# Patient Record
Sex: Female | Born: 1950 | Race: White | Hispanic: No | Smoking: Former smoker
Health system: Southern US, Community
[De-identification: ages and names within clinical notes are randomized; demographics above are authoritative.]

## PROBLEM LIST (undated history)

## (undated) DIAGNOSIS — I5022 Chronic systolic (congestive) heart failure: Secondary | ICD-10-CM

## (undated) DIAGNOSIS — G629 Polyneuropathy, unspecified: Secondary | ICD-10-CM

## (undated) DIAGNOSIS — I639 Cerebral infarction, unspecified: Secondary | ICD-10-CM

## (undated) DIAGNOSIS — E119 Type 2 diabetes mellitus without complications: Secondary | ICD-10-CM

## (undated) DIAGNOSIS — F329 Major depressive disorder, single episode, unspecified: Secondary | ICD-10-CM

## (undated) DIAGNOSIS — K279 Peptic ulcer, site unspecified, unspecified as acute or chronic, without hemorrhage or perforation: Secondary | ICD-10-CM

## (undated) DIAGNOSIS — E782 Mixed hyperlipidemia: Secondary | ICD-10-CM

## (undated) DIAGNOSIS — Z889 Allergy status to unspecified drugs, medicaments and biological substances status: Secondary | ICD-10-CM

## (undated) DIAGNOSIS — F32A Depression, unspecified: Secondary | ICD-10-CM

## (undated) DIAGNOSIS — C50919 Malignant neoplasm of unspecified site of unspecified female breast: Secondary | ICD-10-CM

## (undated) DIAGNOSIS — I2510 Atherosclerotic heart disease of native coronary artery without angina pectoris: Secondary | ICD-10-CM

## (undated) DIAGNOSIS — M069 Rheumatoid arthritis, unspecified: Secondary | ICD-10-CM

## (undated) DIAGNOSIS — F419 Anxiety disorder, unspecified: Secondary | ICD-10-CM

## (undated) DIAGNOSIS — L089 Local infection of the skin and subcutaneous tissue, unspecified: Secondary | ICD-10-CM

## (undated) DIAGNOSIS — N200 Calculus of kidney: Secondary | ICD-10-CM

## (undated) DIAGNOSIS — S21101A Unspecified open wound of right front wall of thorax without penetration into thoracic cavity, initial encounter: Secondary | ICD-10-CM

## (undated) HISTORY — DX: Unspecified open wound of right front wall of thorax without penetration into thoracic cavity, initial encounter: S21.101A

## (undated) HISTORY — PX: BREAST SURGERY: SHX581

## (undated) HISTORY — DX: Mixed hyperlipidemia: E78.2

## (undated) HISTORY — DX: Allergy status to unspecified drugs, medicaments and biological substances: Z88.9

## (undated) HISTORY — DX: Type 2 diabetes mellitus without complications: E11.9

## (undated) HISTORY — DX: Rheumatoid arthritis, unspecified: M06.9

## (undated) HISTORY — DX: Atherosclerotic heart disease of native coronary artery without angina pectoris: I25.10

## (undated) HISTORY — DX: Malignant neoplasm of unspecified site of unspecified female breast: C50.919

## (undated) HISTORY — DX: Chronic systolic (congestive) heart failure: I50.22

## (undated) HISTORY — DX: Cerebral infarction, unspecified: I63.9

## (undated) HISTORY — DX: Calculus of kidney: N20.0

## (undated) HISTORY — DX: Local infection of the skin and subcutaneous tissue, unspecified: L08.9

---
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
## 1970-02-26 HISTORY — PX: TUBAL LIGATION: SHX77

## 1977-02-26 HISTORY — PX: TONSILLECTOMY AND ADENOIDECTOMY: SHX28

## 1986-02-26 HISTORY — PX: MASTECTOMY: SHX3

## 1987-02-27 HISTORY — PX: ABDOMINAL HYSTERECTOMY: SHX81

## 2002-05-19 ENCOUNTER — Other Ambulatory Visit: Admission: RE | Admit: 2002-05-19 | Discharge: 2002-05-19 | Payer: Self-pay | Admitting: Orthopaedic Surgery

## 2002-12-04 ENCOUNTER — Encounter: Payer: Self-pay | Admitting: Internal Medicine

## 2002-12-05 ENCOUNTER — Inpatient Hospital Stay (HOSPITAL_COMMUNITY): Admission: EM | Admit: 2002-12-05 | Discharge: 2002-12-08 | Payer: Self-pay | Admitting: Emergency Medicine

## 2003-02-04 ENCOUNTER — Ambulatory Visit (HOSPITAL_COMMUNITY): Admission: RE | Admit: 2003-02-04 | Discharge: 2003-02-04 | Payer: Self-pay | Admitting: Internal Medicine

## 2003-03-12 ENCOUNTER — Inpatient Hospital Stay (HOSPITAL_COMMUNITY): Admission: AD | Admit: 2003-03-12 | Discharge: 2003-03-13 | Payer: Self-pay | Admitting: Cardiology

## 2003-03-15 ENCOUNTER — Inpatient Hospital Stay (HOSPITAL_COMMUNITY): Admission: AD | Admit: 2003-03-15 | Discharge: 2003-03-16 | Payer: Self-pay | Admitting: Cardiology

## 2004-01-27 ENCOUNTER — Ambulatory Visit: Payer: Self-pay | Admitting: Cardiology

## 2004-11-02 ENCOUNTER — Ambulatory Visit (HOSPITAL_COMMUNITY): Admission: RE | Admit: 2004-11-02 | Discharge: 2004-11-02 | Payer: Self-pay | Admitting: Internal Medicine

## 2005-03-16 ENCOUNTER — Ambulatory Visit: Payer: Self-pay | Admitting: Gastroenterology

## 2005-04-05 ENCOUNTER — Ambulatory Visit: Payer: Self-pay | Admitting: *Deleted

## 2005-04-12 ENCOUNTER — Ambulatory Visit: Payer: Self-pay | Admitting: Gastroenterology

## 2005-06-04 ENCOUNTER — Ambulatory Visit: Payer: Self-pay | Admitting: Gastroenterology

## 2006-01-31 ENCOUNTER — Ambulatory Visit: Payer: Self-pay | Admitting: Cardiology

## 2007-02-05 ENCOUNTER — Ambulatory Visit: Payer: Self-pay | Admitting: Cardiology

## 2007-02-05 ENCOUNTER — Encounter: Payer: Self-pay | Admitting: Physician Assistant

## 2007-02-13 ENCOUNTER — Ambulatory Visit: Payer: Self-pay | Admitting: Cardiology

## 2007-02-18 ENCOUNTER — Ambulatory Visit: Payer: Self-pay | Admitting: Cardiology

## 2007-02-18 ENCOUNTER — Encounter: Payer: Self-pay | Admitting: Physician Assistant

## 2007-02-21 ENCOUNTER — Ambulatory Visit: Payer: Self-pay | Admitting: Internal Medicine

## 2007-02-21 ENCOUNTER — Ambulatory Visit (HOSPITAL_COMMUNITY): Admission: RE | Admit: 2007-02-21 | Discharge: 2007-02-22 | Payer: Self-pay | Admitting: Cardiovascular Disease

## 2007-02-21 ENCOUNTER — Inpatient Hospital Stay (HOSPITAL_BASED_OUTPATIENT_CLINIC_OR_DEPARTMENT_OTHER): Admission: RE | Admit: 2007-02-21 | Discharge: 2007-02-21 | Payer: Self-pay | Admitting: Internal Medicine

## 2007-02-21 ENCOUNTER — Ambulatory Visit: Payer: Self-pay | Admitting: Cardiovascular Disease

## 2007-03-14 ENCOUNTER — Ambulatory Visit: Payer: Self-pay | Admitting: Cardiology

## 2007-07-03 ENCOUNTER — Ambulatory Visit: Payer: Self-pay | Admitting: Gastroenterology

## 2007-07-03 DIAGNOSIS — C50919 Malignant neoplasm of unspecified site of unspecified female breast: Secondary | ICD-10-CM | POA: Insufficient documentation

## 2007-07-03 DIAGNOSIS — I10 Essential (primary) hypertension: Secondary | ICD-10-CM

## 2007-07-03 DIAGNOSIS — F329 Major depressive disorder, single episode, unspecified: Secondary | ICD-10-CM | POA: Insufficient documentation

## 2007-07-03 DIAGNOSIS — K7689 Other specified diseases of liver: Secondary | ICD-10-CM

## 2007-07-03 DIAGNOSIS — R1032 Left lower quadrant pain: Secondary | ICD-10-CM | POA: Insufficient documentation

## 2007-07-03 LAB — CONVERTED CEMR LAB
Basophils Relative: 0.6 % (ref 0.0–1.0)
Eosinophils Absolute: 0.1 10*3/uL (ref 0.0–0.7)
Eosinophils Relative: 3.8 % (ref 0.0–5.0)
HCT: 38.5 % (ref 36.0–46.0)
MCV: 89.6 fL (ref 78.0–100.0)
Monocytes Absolute: 0.2 10*3/uL (ref 0.1–1.0)
Monocytes Relative: 4.9 % (ref 3.0–12.0)
Neutrophils Relative %: 45.7 % (ref 43.0–77.0)
Platelets: 147 10*3/uL — ABNORMAL LOW (ref 150–400)
RBC: 4.3 M/uL (ref 3.87–5.11)
WBC: 3.9 10*3/uL — ABNORMAL LOW (ref 4.5–10.5)

## 2007-07-17 ENCOUNTER — Ambulatory Visit: Payer: Self-pay | Admitting: Cardiology

## 2007-08-14 ENCOUNTER — Ambulatory Visit: Payer: Self-pay | Admitting: Gastroenterology

## 2007-08-18 ENCOUNTER — Telehealth: Payer: Self-pay | Admitting: Gastroenterology

## 2007-08-22 ENCOUNTER — Ambulatory Visit: Payer: Self-pay | Admitting: Gastroenterology

## 2007-08-25 ENCOUNTER — Encounter: Payer: Self-pay | Admitting: Gastroenterology

## 2008-01-02 ENCOUNTER — Ambulatory Visit: Payer: Self-pay | Admitting: Cardiology

## 2008-01-02 ENCOUNTER — Encounter: Payer: Self-pay | Admitting: Physician Assistant

## 2008-02-16 ENCOUNTER — Ambulatory Visit (HOSPITAL_COMMUNITY): Admission: RE | Admit: 2008-02-16 | Discharge: 2008-02-16 | Payer: Self-pay | Admitting: Internal Medicine

## 2008-06-03 ENCOUNTER — Encounter: Payer: Self-pay | Admitting: Physician Assistant

## 2008-07-14 ENCOUNTER — Ambulatory Visit: Payer: Self-pay | Admitting: Cardiology

## 2008-07-30 ENCOUNTER — Encounter: Payer: Self-pay | Admitting: Cardiology

## 2008-12-08 DIAGNOSIS — E782 Mixed hyperlipidemia: Secondary | ICD-10-CM

## 2008-12-08 DIAGNOSIS — I200 Unstable angina: Secondary | ICD-10-CM

## 2009-06-29 ENCOUNTER — Encounter: Payer: Self-pay | Admitting: Cardiology

## 2009-08-08 ENCOUNTER — Telehealth (INDEPENDENT_AMBULATORY_CARE_PROVIDER_SITE_OTHER): Payer: Self-pay | Admitting: *Deleted

## 2009-08-15 ENCOUNTER — Encounter: Payer: Self-pay | Admitting: Cardiology

## 2009-08-16 ENCOUNTER — Ambulatory Visit (HOSPITAL_COMMUNITY): Admission: RE | Admit: 2009-08-16 | Discharge: 2009-08-16 | Payer: Self-pay | Admitting: Internal Medicine

## 2009-08-22 ENCOUNTER — Ambulatory Visit: Payer: Self-pay | Admitting: Physician Assistant

## 2009-08-24 ENCOUNTER — Ambulatory Visit (HOSPITAL_COMMUNITY): Admission: RE | Admit: 2009-08-24 | Discharge: 2009-08-24 | Payer: Self-pay | Admitting: Internal Medicine

## 2010-03-18 ENCOUNTER — Encounter: Payer: Self-pay | Admitting: Internal Medicine

## 2010-03-19 ENCOUNTER — Encounter: Payer: Self-pay | Admitting: Internal Medicine

## 2010-03-19 ENCOUNTER — Encounter: Payer: Self-pay | Admitting: Gastroenterology

## 2010-03-26 LAB — CONVERTED CEMR LAB
Albumin: 3.9 g/dL (ref 3.5–5.2)
BUN: 12 mg/dL (ref 6–23)
Basophils Relative: 0.2 % (ref 0.0–1.0)
Chloride: 102 meq/L (ref 96–112)
Creatinine, Ser: 0.8 mg/dL (ref 0.4–1.2)
Ferritin: 115.5 ng/mL (ref 10.0–291.0)
Glucose, Bld: 267 mg/dL — ABNORMAL HIGH (ref 70–99)
HCT: 38.1 % (ref 36.0–46.0)
Lymphocytes Relative: 43.2 % (ref 12.0–46.0)
MCV: 91.7 fL (ref 78.0–100.0)
Neutro Abs: 2.3 10*3/uL (ref 1.4–7.7)
Potassium: 4.9 meq/L (ref 3.5–5.1)
Saturation Ratios: 20.2 % (ref 20.0–50.0)
Sodium: 137 meq/L (ref 135–145)
TSH: 0.33 microintl units/mL — ABNORMAL LOW (ref 0.35–5.50)
Total Bilirubin: 0.7 mg/dL (ref 0.3–1.2)
Total Protein: 7.4 g/dL (ref 6.0–8.3)
Transferrin: 296.3 mg/dL (ref >=212.0)

## 2010-03-28 NOTE — Progress Notes (Signed)
Summary: SOB  Phone Note Call from Patient   Caller: Patient Call For: nurse Reason for Call: Talk to Nurse Complaint: Breathing Problems Action Taken: Provider Notified Summary of Call: [name] called to schedule an appointment with Dr. Diona Browner. [name]'s first appt is in July.  She indicated that she was having shortness of breath and this is why she needs to see the doctor. She has not seen Diona Browner since August 2010. I am sending this note to the nuse to determine when the ptient should be scheduled for an appt with the doctor.  [name]'s call back # is [phone] Initial call taken by: Alexis Goodell  Follow-up for Phone Call        Pt c/o SOB. She states it's nothing serious but she's never had it before so she would like to be seen. She states this is with exertion and denies chest pain, pressure or tightness. She states she started noticing the DOE about 1 wk ago. She is scheduled to seen Gene on June 27th at 1:30pm. Her last office visit was Jul 14, 2008. She had a reminder for appt with Dr. Diona Browner in August but this appt was never scheduled. Follow-up by: Cyril Loosen, RN, BSN,  August 08, 2009 5:00 PM

## 2010-03-28 NOTE — Assessment & Plan Note (Signed)
Summary: PT WANTS APPT D/T SOB-[name] PT-[facility]   Visit Type:  Follow-up Primary Provider:  Vincente T. Toni Amend, MD [facility]   History of Present Illness: 60 year old female, with multivessel CAD status post artery PCIs, returns for evaluation of DOE and chest tightness.  Since last seen here in clinic in May 2010, patient reports development of DOE and chest tightness, the latter with/without exertion. She has taken one NTG tablet on 2 separate occasions, with prompt relief. Otherwise, symptoms typically spontaneously resolve in approximately 5 minutes. She rates the discomfort as 5/10, with no radiation to jaw or upper extremities. There is some similarity to prior presentations.  A 12-lead EKG in our office today indicates NSR with nonspecific ST changes.  Preventive Screening-Counseling & Management  Alcohol-Tobacco     Smoking Status: quit     Year Quit: 2008  Current Medications (verified): 1)  Metoprolol Tartrate 50 Mg Tabs (Metoprolol Tartrate) .... Take 1 Tablet By Mouth Two Times A Day 2)  Zocor 20 Mg  Tabs (Simvastatin) .... One By Mouth Once Daily 3)  Humalog 100 Unit/ml Soln (Insulin Lispro (Human)) .... Use As Directed 4)  Aspirin 325 Mg  Tabs (Aspirin) .... One By Mouth Once Daily 5)  Metformin Hcl 500 Mg  Tabs (Metformin Hcl) .... One By Mouth Once Daily 6)  Glimepiride 4 Mg  Tabs (Glimepiride) .... One By Mouth Once Daily 7)  Paroxetine Hcl 20 Mg  Tabs (Paroxetine Hcl) .... One By Mouth Once Daily 8)  Aleve 220 Mg  Tabs (Naproxen Sodium) .... One By Mouth As Needed 9)  Nitroglycerin 0.4 Mg Subl (Nitroglycerin) .... One Tablet Under Tongue Every 5 Minutes As Needed For Chest Pain---May Repeat Times Three 10)  Lisinopril 10 Mg Tabs (Lisinopril) .... Take 1 Tablet By Mouth Once A Day 11)  Meloxicam 15 Mg Tabs (Meloxicam) .... Take 1 Tablet By Mouth Once A Day 12)  Gabapentin 300 Mg Caps (Gabapentin) .... Take 1 Tablet By Mouth Two Times A Day 13)  Lantus 100 Unit/ml  Soln (Insulin Glargine) .... Use As Directed 14)  Cinnamon 500 Mg Tabs (Cinnamon) .... Take 2 Tablet By Mouth Once A Day 15)  Vitamin B-12 1000 Mcg Tabs (Cyanocobalamin) .... Take 1 Tablet By Mouth Once A Day 16)  Adoxa 150 Mg Caps (Doxycycline Monohydrate) .... Take 1 Tablet By Mouth Once A Day 17)  Cephalexin 500 Mg Tabs (Cephalexin) .... Take 1 Tablet By Mouth Three Times A Day 18)  Norvasc 5 Mg Tabs (Amlodipine Besylate) .... Take 1 Tablet By Mouth Once A Day  Allergies (verified): 1)  ! * Plavix 2)  ! Codeine 3)  ! Morphine  Comments:  Nurse/Medical Assistant: The patient's medication bottles and allergies were reviewed with the patient and were updated in the Medication and Allergy Lists.  Past History:  Past Medical History: Last updated: 12/08/2008 HYPERLIPIDEMIA-MIXED (ICD-272.4) CHEST PAIN-UNSPECIFIED (ICD-786.50) FATTY LIVER DISEASE (ICD-571.8) ABDOMINAL PAIN-LLQ (ICD-789.04) HYPERTENSION (ICD-401.9) DEPRESSION (ICD-311) BREAST CANCER (ICD-174.9) ARTHRITIS (ICD-716.90) DIABETES (ICD-250.00) DIVERTICULOSIS (ICD-562.10)  Social History: Smoking Status:  quit  Review of Systems       reports new onset intermittent claudication, worse on left. Also complains of bilateral hand swelling and joint stiffness, as well as bilateral arm tingling (worse on left). Remaining systems reviewed, and are negative  Vital Signs:  Patient profile:   60 year old female Height:      65 inches Weight:      188 pounds BMI:     31.40 Pulse rate:  75 / minute BP sitting:   147 / 76  (left arm) Cuff size:   regular  Vitals Entered By: Carlye Grippe (August 22, 2009 1:30 PM)  Nutrition Counseling: Patient's BMI is greater than 25 and therefore counseled on weight management options.   Physical Exam  Additional Exam:  GEN:60 year old female, sitting upright, no distress HEENT: NCAT,PERRLA,EOMI NECK: palpable pulses, no bruits; no JVD; no TM LUNGS: CTA bilaterally HEART: RRR  (S1S2); no significant murmurs; no rubs; no gallops ABD: soft, NT; intact BS EXT: intact bilateral femoral pulses, with soft right bruit; minimally palpable dishonest pedis pulses, with no significant edema SKIN: warm, dry MUSC: no obvious deformity NEURO: A/O (x3)     EKG  Procedure date:  08/22/2009  Findings:      normal sinus rhythm at 75 bpm; LAD; nonspecific ST segment changes  Impression & Recommendations:  Problem # 1:  CHEST PAIN-UNSPECIFIED (ICD-786.50)  The option of proceeding with either a stress test or a cardiac catheterization was offered. At present, patient prefers adjustment of her medications, followed by early return visit with me/Dr. Diona Browner. If her symptoms do not improve, then she is amenable to an initial stress test and, if this is abnormal, proceeding with a repeat cardiac catheterization. Therefore, I will add amlodipine 5 mg daily to her current regimen, and have her return in 3 weeks for early followup. She has also been advised to go directly to the ED, in the event of worsening symptoms not relieved by 3 nitroglycerin 3 tablets.  Problem # 2:  HYPERLIPIDEMIA-MIXED (ICD-272.4)  Will request most recent fasting lipid profile from Dr. Nickola Major office. Aggressive management recommended, with target LDL of 70 or less, if feasible.  Problem # 3:  HYPERTENSION (ICD-401.9)  As noted, will add amlodipine 5 mg daily, both for anti anginal as well as antihypertensive benefit.  Problem # 4:  DIABETES (ICD-250.00) Assessment: Comment Only  Other Orders: EKG w/ Interpretation (93000)  Patient Instructions: 1)  Norvasc 5mg  daily 2)  Follow up in  3 weeks Prescriptions: NORVASC 5 MG TABS (AMLODIPINE BESYLATE) Take 1 tablet by mouth once a day  #30 x 6   Entered by:   Hoover Brunette, LPN   Authorized by:   Nelida Meuse, PA-C   Signed by:   Hoover Brunette, LPN on 96/78/9381   Method used:   Electronically to        [id number] (retail)        [address]       [place], [place]  [address]       Ph: [phone]       Fax: [phone]   RxID:   [id number]   Handout requested.

## 2010-03-28 NOTE — Letter (Signed)
Summary: External Correspondence/ OFFICE VISIT DR. Soyla Dryer  External Correspondence/ OFFICE VISIT DR. Soyla Dryer   Imported By: Dorise Hiss 09/06/2009 16:29:18  _____________________________________________________________________  External Attachment:    Type:   Image     Comment:   External Document

## 2010-07-11 NOTE — Assessment & Plan Note (Signed)
[facility]                          [facility]   NAME:[name], [name]                     MRN:          [id number]  DATE:07/17/2007                            DOB:          11/27/1950    CARDIOLOGIST:  [name].   PRIMARY CARE PHYSICIAN:  [name].   REASON FOR VISIT:  110-month followup.   HISTORY OF PRESENT ILLNESS:  [name] is a 60 year old female patient  with a history of CAD.  She is status post multiple percutaneous  coronary interventions in the past.  She last underwent stenting to the  LAD and RCA in December 2008.  She saw [name] Serpe, PA-C, in followup  March 14, 2007.  At that point, she was doing well.  She returns today  for routine followup.   The patient denies any intercurrent development of chest pain, shortness  breath.  She walks several times a week without symptoms.  She denies  any orthopnea, PND, or pedal edema.  Denies any syncope or near-syncope.   CURRENT MEDICATIONS:  1. Insulin as directed.  2. Metoprolol ER 50 mg 1-1/2 tablet daily.  3. Paroxetine 20 mg daily.  4. Glimepiride 4 mg daily.  5. Simvastatin mg nightly.  6. Aspirin 325 mg daily.  7. Lisinopril 10 mg daily.  8. Metformin 5 mg daily.  9. Ticlid 250 mg b.i.d.   PHYSICAL EXAM:  She is a well-developed, well-nourished female.  Blood pressure 119/70, pulse 62, weight 179.4 pounds.  HEENT is normal.  NECK:  Without JVD.  CARDIAC:  Normal S1 and S2.  Regular rate and rhythm without murmur.  LUNGS:  Clear to auscultation bilaterally.  ABDOMEN:  Soft, nontender.  EXTREMITIES:  No edema.  NEUROLOGIC:  She is alert and oriented x3.  Cranial nerves 2-12 grossly  intact.  VASCULAR:  Without carotid bruits bilaterally.  Dorsalis pedis and  posterior tibialis pulses 2+ bilaterally.   IMPRESSION:  1. Coronary artery disease.      a.     Status post Taxus stenting to the LAD and RCA December 2008.      b.     Patent  stents noted in the LAD circumflex RCA by recent       cardiac catheterization.      c.     Status post PCI to the RCA and circumflex in 2001 in       [place], [place].      d.     Status post stenting to the proximal RCA and subsequent       TAXUS and bare metal stenting to the LAD and circumflex in January       2005 at [facility].  2. PLAVIX allergy.      a.     Tolerating Ticlid  3. Good LV function.  4. Diabetes mellitus.  5. Hypertension.  6. Hyperlipidemia.   PLAN:  1. The patient is doing well from a cardiovascular standpoint.  She      remains on Ticlid.  She needs a followup CBC.  She should have CBC  q.6 weeks until she is off the Ticlid.  2. We will check fasting lipid panel and CMET to follow up on her      lipid control and LFTs.  3. The patient to brought back in followup in the next 6 months for      followup with [name] or sooner p.r.n.      Tereso Newcomer, PA-C  Electronically Signed      [name] Sidle, MD  Electronically Signed   SW/MedQ  DD: 07/17/2007  DT: 07/17/2007  Job #: [id number]   cc:   [name]

## 2010-07-11 NOTE — Assessment & Plan Note (Signed)
[facility]                          [facility] NOTE   NAME:Neyer, HARLEE ECKROTH                     MRN:          [id number]  DATE:02/05/2007                            DOB:          1950/04/23    PRIMARY CARE PHYSICIAN:  Dr. Doreen Beam.   REASON FOR VISIT:  Cardiac followup.   HISTORY OF PRESENT ILLNESS:  Ms. Pillars comes in for a 1 year visit.  She  has a history of cardiovascular disease, status post previous  percutaneous intervention to the right coronary artery and circumflex in  2001, with placement of a drug-eluting stent to the left anterior  descending in January 2005.  Notably she has a previous significant  Plavix allergy resulting in discontinuation of this medicine.  She has  otherwise been maintained on the medicines outlined below and has done  fairly well until the last 3 months.  She states that she has had  intermittent episodes of quick sharp pains in her chest.  She also  states that she occasionally feels tired and has been under a lot of  stress.  Additional symptoms include occasional heaviness in her left  arm and sometimes a pain in her left jaw. These have not been clearly  predictable with exertion.  Her electrocardiogram today shows a sinus  rhythm with more prominent T wave changes particularly in the  anterolateral leads in comparison to the previous tracing.   ALLERGIES:  1. CODEINE.  2. PLAVIX.   PRESENT MEDICATIONS:  1. Aspirin 325 mg p.o. daily.  2. Lisinopril 10 mg p.o. daily.  3. Metformin 500 mg p.o. daily.  4. Simvastatin 20 mg p.o. q.h.s.  5. Glimepiride 4 mg p.o. daily.  6. Paroxetine 20 mg p.o. daily.  7. Metoprolol 75 mg p.o. daily.  8. Insulin 73 units b.i.d.  9. Nitroglycerin sublingual 0.4 mg p.r.n.   REVIEW OF SYSTEMS:  As described in the history of present illness.   PHYSICAL EXAMINATION:  VITAL SIGNS:  Blood pressure today is 143/74,  heart rate is 80, weight is 179.2 pounds  which is stable.  GENERAL:  The patient is comfortable and in no acute distress.  HEENT:  Conjunctivae and lids are normal.  Pharynx is clear.  NECK:  Supple.  No elevated jugular venous pressure.  No loud bruits.  No thyromegaly is noted.  LUNGS:  Clear without labored breathing.  CARDIAC:  Reveals a regular rate and rhythm.  No S3 gallop or  pericardial rub.  ABDOMEN;  Soft, nontender.  Normoactive bowel sounds.  EXTREMITIES:  Show no pitting edema.  Distal pulses 2 plus.  SKIN:  Warm and dry.  MUSCULOSKELETAL:  No kyphosis is noted.  NEUROPSYCHIATRIC:  The patient is alert and oriented x3.  Affect is  normal.   IMPRESSION AND RECOMMENDATIONS:  1. Cardiovascular disease as outlined above.  Given symptoms reported      over the last 3 months including fairly atypical chest pain with      perhaps more typical left arm and neck discomfort associated with      increased stress and fatigue, our plan  will be an adenosine      Cardiolite on medical therapy.  I will then have her follow up in      the office over the next few weeks with one of the physician      extenders to review the results.  If she manifests a significant      abnormality and needs a cardiac catheterization, we need to keep in      mind the fact that she has a documented Plavix allergy and a repeat      intervention would need to be dealt with accordingly (possibly      Ticlid?).  2. Further plans to follow.     Jonelle Sidle, MD  Electronically Signed    SGM/MedQ  DD: 02/05/2007  DT: 02/05/2007  Job #: [id number]   cc:   Doreen Beam

## 2010-07-11 NOTE — Assessment & Plan Note (Signed)
[facility]                          [facility] NOTE   NAME:[name], [name]                     MRN:          [id number]  DATE:03/14/2007                            DOB:          1950-08-12    PRIMARY CARDIOLOGIST:  Jonelle Sidle, MD.   REASON FOR VISIT:  Post-hospital followup.   HISTORY:  [name] returns to the clinic for post-hospital followup,  after undergoing Taxus stenting of 2 high-grade lesions of the LAD and  right coronary artery on February 21, 2007, by Dr. Tonny Bollman.   This was on the heels of an abnormal stress test, initially ordered by  Dr. Diona Browner when the patient was seen here in followup for further  evaluation of chest pain.  She had known coronary artery disease, status  post several previous percutaneous interventions, and was also known to  have a severe allergic reaction to Plavix, requiring treatment with  Ticlid.   The patient was subsequently seen by [place] here in the clinic in followup  on December 23, for review of the abnormal stress test results.  We  referred her for an elective cardiac catheterization later that same  week.  She was found to have a focal 95% stenosis of the LAD as well as  a focal 85% stenosis of the RCA.  Regarding the previously placed  stents, there was continued wide patency of the LAD stent, wide patency  of 2 CFX stents, and 40% instent restenosis of the RCA.  Left  ventricular function was preserved (EF 60%), with no wall motion  abnormalities.  Of note, mild peripheral vascular disease was also noted  with 20% right RAS and 40% left RAS.   The patient underwent successful stenting with placement of 2 Taxus  stents, with no noted complications.  A recommended followup CBC was  done on February 28, 2007, and entirely within normal limits.   Clinically, the patient reports only 2 singular episodes of chest  discomfort, both brief in duration, and which resolved  spontaneously  after only a few seconds.  She has not had to take any nitroglycerin.   SOCIAL HISTORY:  The patient does not smoke tobacco.   CURRENT MEDICATIONS:  1. Full dose aspirin.  2. Ticlid 250 b.i.d.  3. Metformin 500 daily.  4. Glimepiride 4 daily.  5. Insulin 73 units b.i.d.  6. Metoprolol ER 75 daily.  7. Simvastatin 20 daily.  8. Lisinopril 10 daily.  9. Paroxetine 20 daily.   PHYSICAL EXAMINATION:  VITAL SIGNS:  Blood pressure 148/73, pulse 62,  regular, weight 179.  GENERAL:  A 60 year old female, moderately obese, sitting upright in no  distress.  HEENT:  Normocephalic, atraumatic.  NECK:  Palpable bilateral carotid pulses without bruits, no JVD.  LUNGS:  Clear to auscultation in all fields.  HEART:  Regular rate and rhythm (S1 and S2) no murmurs or rubs.  ABDOMEN:  Protuberant, nontender.  EXTREMITIES:  Stable right groin with no ecchymosis, hematoma or bruit  on  auscultation, tactile femoral and distal pulses.  NEURO:  No focal deficits.   IMPRESSION:  1.  Multivessel coronary artery disease, with recent progression.      a.     Status post Taxus stenting focal left anterior descending       and right coronary artery stenoses, December 2008.      b.     Residual patent stents of the left anterior descending,       circumflex and right coronary arteries.      c.     Status percutaneous coronary intervention right coronary       artery and circumflex artery in 2001 [place], [place]).      d.     Status post stenting proximal right coronary artery,       subsequent Taxus and Multilink stenting of left anterior       descending and circumflex artery, January 2005 [facility]).  2. Plavix allergy, tolerating Ticlid.  3. Preserved left ventricular function.  4. Insulin-dependent diabetes mellitus.  5. Hypertension.  6. Dyslipidemia.  7. Remote tobacco.   PLAN:  1. Continue Ticlid in conjunction with aspirin for 1 full year.  2. Check serial CBCs in 2  weeks for close monitoring of neutropenia,      while patient is on Ticlid.  3. Surveillance of fasting lipid/liver profile in 12 weeks.  4. Schedule return clinic followup with myself and Dr. Diona Browner in 3      months.      Rozell Searing, PA-C  Electronically Signed      Jonelle Sidle, MD  Electronically Signed   GS/MedQ  DD: 03/14/2007  DT: 03/14/2007  Job #: [id number]   cc:   Doreen Beam

## 2010-07-11 NOTE — Discharge Summary (Signed)
NAME:  [name], [name]              ACCOUNT NO.:  [id number]   MEDICAL RECORD NO.:  [id number]          PATIENT TYPE:  OIB   LOCATION:  3710                         FACILITY:  [facility]   PHYSICIAN:  Gerrit Friends. Dietrich Pates, MD, FACCDATE OF BIRTH:  09/26/1950   DATE OF ADMISSION:  02/21/2007  DATE OF DISCHARGE:  02/22/2007                               DISCHARGE SUMMARY   PROCEDURES:  Performed during hospitalization,  1. Cardiac catheterization per Dr. Arvilla Meres on February 21, 2007, revealing 3-vessel CAD, normal LV function, mild to moderate      renal artery stenosis.  Please see Dr. Prescott Gum thorough      dictation note for more details.  It is not available at the time      of this dictation.  2. Status post successful 2-vessel PCI of the LAD and right coronary      artery using a TAXUS Liberte stent.   FINAL DISCHARGE DIAGNOSES:  1. Coronary artery disease.      a.     Status post cardiac catheterization revealing 3-vessel       coronary artery disease.      b.     Status post TAXUS Liberte stent to the left anterior       descending artery and mild right coronary artery.      c.     Status post percutaneous coronary intervention right       coronary artery and circumflex in 2001 in [place], [place].      d.     Status post stenting of the proximal right coronary artery       and subsequent TAXUS and multi length stenting of the left       anterior descending artery and circumflex - January 2005.  2. Severe subsequent allergic reaction to Plavix, substituted with      Ticlid.  3. History of supraventricular tachycardia.  4. Hypertension.  5. Dyslipidemia.  6. Insulin-dependent diabetes.  7. History of thrombocytopenia.  8. Breast cancer.      a.     Status post mastectomy and chemotherapy 18 years ago.  9. Total abdominal hysterectomy.  10.Cholecystectomy.  11.History of tobacco abuse.   HOSPITAL COURSE:  This is a 60 year old Caucasian female with  previously  documented coronary artery disease, status post multiple percutaneous  interventions who recently went to follow up with Dr. Simona Huh in  [place] and complained of a several month history of chest pain felt to be  atypical and referred to pharmacological stress test.  The patient had abnormal stress test which was found to be suggestive of  a medium, completely reversible, apical, mid anterior defect consistent  with ischemia.  The patient was subsequently transferred to [facility] and cardiac catheterization for re-evaluation of coronary  anatomy and intervention as necessary.  On arrival, the patient was seen by Dr. Arvilla Meres with cardiac  catheterization completed revealing a 3-vessel CAD, normal LV, and mild  to moderate right renal artery stenosis.  The patient had subsequent 2-  vessel PCI of the LAD and  right coronary artery.  The patient was also  found to be allergic to Plavix on past medical records and therefore was  started on Ticlid.  The patient was seen and examined the following day by Dr. Villa Pancho Bing, felt well with no complaints of hematoma, bleeding, signs of  infection or pain.  Right groin site looked healthy.  The patient had no  further complaints of chest pain or shortness of breath.  The patient's  vital signs were stable and she was found to be ready for discharge per  Dr. Dietrich Pates.   She will go home on Ticlid and other medications as prescribed.   Troponin 0.06, CK-MB 1.6, CK 104.  Sodium 133, potassium 3.5, chloride  103, CO2 24, glucose 285, BUN 12, creatinine 0.74.  Hemoglobin 11.9,  hematocrit 34.9, white blood cells 4.2, platelets 172.   DISCHARGE MEDICATIONS:  1. Ticlid 250 twice a day.  2. Aspirin 325 daily.  3. Imdur 30 mg daily.  4. Lisinopril 10 mg daily.  5. Glimepiride 4 mg daily.  6. Metformin 500 daily.  7. Novolin insulin 73 units subcutaneous daily.  8. Paroxetine 20 mg daily.  9. Simvastatin 20 mg at  bedtime.  10.Metoprolol ER 75 mg daily.   ALLERGIES:  1. CODEINE.  2. PLAVIX.   FOLLOWUP PLANS/APPOINTMENTS:  1. The patient will follow up with Dr. Simona Huh in the [place]      office.  Our office will call to make the followup appointment as      this is a weekend and they will call her at home.  2. The patient is scheduled for a CBC in 1 week with results to go to      Dr. Diona Browner in [place].  This will be arranged by [facility] office as      well.  3. The patient has been given post cardiac catheterization      instructions with particular emphasis on the right groin site for      evidence of bleeding, hematoma, signs of infection, or pain with      phone number provided.  4. The patient is to follow with her primary care physician for      continued medical management.   Time spent with the patient to include physician time:  Thirty-five  minutes.      Bettey Mare. Lyman Bishop, NP      Gerrit Friends. Dietrich Pates, MD, [facility]  Electronically Signed    KML/MEDQ  D:  02/22/2007  T:  02/23/2007  Job:  [id number]

## 2010-07-11 NOTE — Assessment & Plan Note (Signed)
[facility]                          [facility]   NAME:[name], [name] WEISENBURGER                     MRN:          [id number]  DATE:01/02/2008                            DOB:          07/16/1950    PRIMARY CARE PHYSICIAN:  Dr. Doreen Beam.   SURGEON:  Lorne Skeens. Hoxworth, MD   REASON FOR VISIT:  Preoperative evaluation.   HISTORY OF PRESENT ILLNESS:  [name] is a pleasant woman last seen in  the office back in May.  She has a history of multiple percutaneous  interventions including most recently placement of drug-eluting stents  within the left anterior descending and right coronary artery in late  December 2008.  She has a documented Plavix allergy and has been  maintained on both aspirin and Ticlid with a followup blood work showing  normal, hemoglobin of 12.6, WBC 3.1 and platelets 140, relatively stable  (labs done on the December 05, 2007).  She is not reporting any problems  with angina.  She has been under a lot of stress work and feels  generally tired and fatigued.  She is being considered for removal of an  abdominal wall mass under general anesthesia by Dr. Johna [name].  This  procedure has not yet been scheduled.  Today, I reviewed with her the  placement of her drug-eluting stents back in December and typical  recommendation of 1 year of uninterrupted dual antiplatelet therapy  following this type of procedure.  As her surgery is elective, I have  recommended that she hold off until January, at which time, she could be  reasonably taken off of Ticlid with a plan to continue aspirin alone at  that point following her surgery.  She was comfortable with this.   ALLERGIES:  CODEINE and PLAVIX (hives).   PRESENT MEDICATIONS:  1. Insulin 73 units subcu b.i.d.  2. Paroxetine 20 mg p.o. daily.  3. Glimepiride 4 mg p.o. daily.  4. Crestor 20 mg p.o. daily.  5. Aspirin 325 mg p.o. daily.  6. Lisinopril 10 mg p.o. daily.  7.  Metformin 500 mg p.o. daily.  8. Ticlid 250 mg p.o. b.i.d.  9. Naproxen 500 mg p.o. b.i.d.  10.Metoprolol 50 mg p.o. b.i.d.   REVIEW OF SYSTEMS:  As outlined above.  Otherwise negative.   PHYSICAL EXAMINATION:  VITAL SIGNS:  Blood pressure today is 147/74,  heart rate is 69, weight is 179 pounds.  GENERAL:  The patient is comfortable and in no acute distress.  NECK:  No elevated jugular venous pressure.  No loud bruits.  No  thyromegaly is noted.  LUNGS:  Clear without labored breathing at rest.  CARDIAC:  Regular rate and rhythm.  No pericardial rub or S3 gallop.  EXTREMITIES:  No frank pitting edema.  No petechia.   LABORATORY DATA:  Additional laboratory studies from early October  showed normal BUN and creatinine of 11 and 0.8, AST 33, ALT 31, LDL was  144, prompting the initiation of Crestor from simvastatin.   A 12-lead electrocardiogram shows sinus rhythm with nonspecific ST-T  wave changes and no marked change  compared to the previous tracing from  December 2008.   IMPRESSION AND RECOMMENDATIONS:  1. Cardiovascular disease status post multiple percutaneous      interventions, most recently including drug-eluting stent placement      to the left anterior descending and right coronary artery in late      December 2008.  Left ventricular ejection fraction is in a low      normal range and symptomatically [name] has been stable without      angina on medical therapy.  Her electrocardiogram is unchanged.  I      think, generally, she should be able to proceed with her planned      surgery, although would hold off until January so that she could      complete an entire year of dual antiplatelet therapy prior to      interruption.  In January she should be able to come off of aspirin      and Ticlid for her surgery and then subsequently go back on aspirin      alone at 325 mg daily.  I will arrange a followup CBC since she      continues on Plavix at this time.  Otherwise,  clinically we will      plan to see her back in 6 months.  2. Hyperlipidemia with LDL up to 144 on simvastatin as of early      October.  She is now on Crestor and we will have lipids and liver      function test obtained next few months.     Jonelle Sidle, MD  Electronically Signed    SGM/MedQ  DD: 01/02/2008  DT: 01/02/2008  Job #: [id number]   cc:   Dario Ave. Hoxworth, M.D.

## 2010-07-11 NOTE — Assessment & Plan Note (Signed)
[facility]                          [facility]   NAME:[name], [name]                     MRN:          [id number]  DATE:07/14/2008                            DOB:          1950-12-30    PRIMARY CARE PHYSICIAN:  Dr. Doreen Beam   REASON FOR VISIT:  Scheduled followup.   HISTORY OF PRESENT ILLNESS:  I saw [name] back in November 2009 in  the setting of preoperative evaluation.  She was being considered for  removal of an abdominal wall mass under general anesthesia by Dr.  Johna Sheriff.  I recommended that she not proceed with this until January  2010 at which time, her dual antiplatelet therapy could be reasonably  interrupted.  My understanding is that she never proceeded with surgery.  She comes in today for a regular visit and states that she has had some  episodes of chest tightness as well as some left shoulder and neck  discomfort at times.  Usually this occurs with episodes of emotional  upset, not necessarily exertion.  She has not used nitroglycerin as the  episodes are typically fairly brief, lasting most only a few minutes.  She has been compliant with medications by report and presents with some  recent outpatient testing including a mammogram that did not describe  any evidence of malignancy and also blood work showing a hemoglobin of  12.3, platelets of 162, BUN 18, creatinine 0.7, AST 31, ALT 32, HDL 38,  and LDL 85.  Her hemoglobin A1c was, however, 10.0.  Her last stress  test was in December 2008 with findings of a medium reversible mid  anterior defect.  She underwent cardiac catheterization that same month  and underwent drug-eluting stent placement to the left anterior  descending and right coronary arteries.   ALLERGIES:  CODEINE and PLAVIX.   PRESENT MEDICATIONS:  1. Paroxetine 20 mg p.o. daily.  2. Glimepiride 4 mg p.o. daily.  3. Simvastatin 20 mg p.o. nightly.  4. Aspirin 325 mg p.o. daily.  5.  Lisinopril 10 mg p.o. daily.  6. Metformin 500 mg p.o. daily.  7. Crestor 20 mg p.o. daily.  8. Lopressor 50 mg p.o. b.i.d.  9. Novolin N 70 units subcu b.i.d.  10.Skelaxin 800 mg p.r.n.  11.Diclofenac 75 mg p.r.n.  12.Sublingual nitroglycerin 0.4 mg p.r.n.  13.HyoMax-SL 0.125 mg p.r.n.   REVIEW OF SYSTEMS:  As outlined above.  No palpitations or syncope.  No  orthopnea or PND.  No cough.  Otherwise, negative.   PHYSICAL EXAMINATION:  VITAL SIGNS:  Blood pressure is 126/70, heart  rate is 68, and weight is 182 pounds.  GENERAL:  The patient is comfortable in no acute distress.  NECK:  No elevated jugular venous pressure.  No significant carotid  bruits.  No thyromegaly.  LUNGS:  Clear.  CARDIAC:  Regular rate and rhythm.  No S3 gallop or loud systolic  murmur.  EXTREMITIES:  No significant pitting edema.   IMPRESSION AND RECOMMENDATIONS:  1. Recurrent episodes of chest discomfort with some shoulder and neck      discomfort on the  left, not clearly exertional, but precipitated by      emotional upset.  She may well be having some breakthrough angina.      These episodes have been fairly brief.  She has not actually used      any sublingual nitroglycerin as yet.  We have elected to institute      Imdur 30 mg daily in addition to her baseline medical regimen.  If      this is effective, we will continue observation, otherwise we will      plan to proceed with a followup Cardiolite on medical therapy.      Clinic visit will be made for the next 3 months, sooner if she has      progressive symptoms that do not respond to the Imdur.  2. Hyperlipidemia, fairly good LDL control at 85 on simvastatin.  3. History of diabetes mellitus, followed by Dr. Toni Amend.     Jonelle Sidle, MD  Electronically Signed    SGM/MedQ  DD: 07/14/2008  DT: 07/15/2008  Job #: [id number]   cc:   Doreen Beam

## 2010-07-11 NOTE — Cardiovascular Report (Signed)
NAMETEIRA, ARCILLA              ACCOUNT NO.:  [id number]   MEDICAL RECORD NO.:  [id number]          PATIENT TYPE:  OIB   LOCATION:  1963                         FACILITY:  [facility]   PHYSICIAN:  Bevelyn Buckles. [name], MDDATE OF BIRTH:  [birth year]   DATE OF PROCEDURE:  DATE OF DISCHARGE:                            CARDIAC CATHETERIZATION   PRIMARY CARE PHYSICIAN:  Dr. Doreen Beam   CARDIOLOGIST:  Dr. Simona Huh.   PATIENT INDICATIONS:  [name] is a 60 year old woman with multiple  cardiac risk factors including a long history of diabetes, hypertension,  hyperlipidemia and previous tobacco use which she quit 13 months ago.  She also has known coronary artery disease with previous stenting of all  three native coronary vessels.  She has a Plavix allergy and has  previously taken Ticlid but is not taking currently.  She has been  experiencing chest pain which is both typical and atypical.  She had  Myoview which showed a moderate completely reversible defect in the mid  to apical anterior wall.  She is referred for outpatient  catheterization.   PROCEDURES PERFORMED:  1. Selective coronary angiography.  2. Left heart cath.  3. Left ventriculogram.  4. Abdominal aortogram.   DESCRIPTION OF PROCEDURE:  The risks and indication of the procedure  were explained.  Consent was signed and placed on the chart.  A 4-French  arterial sheath was placed in the right femoral artery using a modified  Seldinger technique.  Standard catheters including JL-4, 3ERC and angled  pigtail were used throughout the procedure.  All catheter exchanges made  over wire.  There were no apparent complications.   Left main was normal.   LAD was coursed to the apex.  It was a heavily calcified vessel.  It  gave off a tiny first diagonal.  A large second diagonal.  In the  proximal portion of the LAD, there is a tubular 40-50% lesion.  In the  midportion of the LAD, there was a stent which was widely  patent.  After  the stent and the second diagonal there was 95% focal stenosis.  The  distal LAD was diffusely diseased, in the second diagonal, there is a  50% tubular lesion in the proximal portion.   Left circumflex gave off a small caliber but long OM-1, a large OM-2 and  a small OM three.  There is a 40% stenosis in the ostial left  circumflex, right after the takeoff of the OM-1 there was a small stent.  There was a second stent leading from the mid AV groove circumflex into  the OM II.  Both stents were widely patent.  In the OM-1, there was a  focal 80-90% lesion proximal to midportion.   Right coronary artery was a dominant vessel.  It gave off several small  RV branches, a large acute marginal, moderate size PDA and  posterolateral.  There was a stent in the proximal portion of the RCA to  distal portion of the stent.  There was a 40% stenosis.  In the mid RCA  after the stent., there was an 85%  focal lesion followed by a tubular  50% lesion.  At the distal RCA, there was a 40% lesion prior to the  takeoff of the PDA and diffuse disease afterwards.  The vessel was  calcified.   Ventriculogram done in the RAO position showed an EF of 60%.  There was  no wall motion abnormalities.  Abdominal aortogram showed moderate  diffuse plaquing through the abdominal aorta and iliacs.  There is no  aneurysm.  The right renal had a 20% proximal stenosis.  The left renal  had about 40% smooth narrowing.   ASSESSMENT:  1. Three-vessel coronary artery disease as described above with high-      grade lesions in the mid-LAD, mid RCA and OM-1  2. Normal LV function.  3. Mild to moderate peripheral arterial disease.  4. Plavix allergy with severe hives necessitating Ticlid in the past.   PLANS/DISCUSSION:  [name] will need intervention on her on her mid-  LAD and mid right lesion.  Given the small caliber of the OM-1 vessel.  I suspect we will treat this medically.  I will review with Dr.  Excell Seltzer  based on the timing of angioplasty.      Bevelyn Buckles. Bensimhon, MD  Electronically Signed     DRB/MEDQ  D:  02/21/2007  T:  02/21/2007  Job:  [id number]

## 2010-07-11 NOTE — Assessment & Plan Note (Signed)
[facility]                          [facility]ICE NOTE   NAME:[name], JAQUITTA DUPRIEST                     MRN:          [id number]  DATE:02/18/2007                            DOB:          08-May-1950    PRIMARY CARDIOLOGIST:  Dr. Simona Huh.   REASON FOR VISIT:  [name] is a 60 year old female with previously  documented coronary artery disease, status post multiple percutaneous  interventions, who recently presented to our clinic for followup with  Dr. Simona Huh.  She presented with a several-month history of chest  pain, felt to be atypical, and was referred for a pharmacologic stress  test.  This was reviewed by Dr. Lewayne Bunting and found to be suggestive of  a medium, completely reversible apical/mid anterior defect consistent  with ischemia.  Calculated ejection fraction was 51% with normal wall  motion.   Patient now returns to the clinic for review of this test result, and  discussion regarding further evaluation with a cardiac catheterization.   Since her last office visit, [name] states that she has had only  occasional chest tightness, the last occurring 1 week ago.  She did not  take any nitroglycerin for that particular episode, but had used  nitroglycerin in the recent past.  Her symptoms occur both with and  without activity, but appear to be exacerbated by exertion.  She refers  to the chest discomfort as a tightness which relieves with rest.  She  also refers to occasional jaw pain, although this is not strictly  correlated to the chest discomfort.  As noted, these symptoms are not  precipitated only by activity or exertion, but also under periods of  stress.  She refers to experiencing significant stress at her place of  employment and, in fact, had an episode of chest tightness several weeks  ago there, while arguing with one of her co-workers.  In that particular  case, she took 1 nitroglycerin tablet with prompt  relief.   Patient denies any progression of these symptoms, either in terms of  frequency or intensity, over the preceding 2 to 3 months.  They are,  however, reminiscent of her symptoms which preceded prior percutaneous  interventions.   ALLERGIES:  1. CODEINE.  2. PLAVIX.   CURRENT MEDICATIONS:  1. Novolin N 73 units b.i.d.  2. Metoprolol ER 75 mg daily.  3. Paroxetine 20 daily.  4. Glimepiride 4 daily.  5. Simvastatin 20 nightly.  6. Full-dose aspirin.  7. Lisinopril 10 daily.  8. Metformin 500 daily.   PAST MEDICAL HISTORY:  1. Multivessel coronary artery disease.      a.     Status post PCI RCA and CFX in 2001 ([place], [place]).      b.     Status post stenting proximal RCA and subsequent TAXUS and       multi-link stenting of the LAD and CFX January 2005 [facility]).      c.     Severe subsequent allergic reaction to Plavix.  Substituted       with Ticlid.  2. Normal left ventricular  function.  3. History of NSVT.  4. Hypertension.  5. Dyslipidemia.  6. Insulin-dependent diabetes mellitus.  7. History of thrombocytopenia.  8. Right breast cancer.      a.     Status post mastectomy/chemotherapy 18 years ago.  9. Total abdominal hysterectomy.  10.Cholecystectomy.  11.History of tobacco.   SOCIAL HISTORY:  Patient is married, has 2 grown children.  She stopped  smoking tobacco about 13 months ago, and started smoking at the age of  56.  Denies alcohol use.   FAMILY HISTORY:  Positive for coronary artery disease.   REVIEW OF SYSTEMS:  Denies any recent evidence of overt bleeding.  Denies orthopnea, PND, or lower extremity edema.  Otherwise, as noted  her HPI.  The remaining systems negative.   PHYSICAL EXAMINATION:  Blood pressure 144/72.  Pulse 79, regular.  Weight 179.8.  GENERAL:  A 60 year old female, mildly obese.  Sitting upright in no  distress.  HEENT:  Normocephalic and atraumatic.  NECK:  Palpable bilateral carotid pulses without bruits.  No   thyromegaly.  LUNGS:  Clear to auscultation in all fields.  HEART:  Regular rate and rhythm (S1 and S2).  No significant murmurs.  No rubs.  ABDOMEN:  Soft and nontender.  Intact bowel sounds.  EXTREMITIES:  Palpable bilateral femoral pulses without bruits.  Intact  distal pulses with trace pedal edema.  SKIN:  Warm and dry.  MUSCULOSKELETAL:  No kyphosis.  NEUROLOGIC:  No focal deficits.   IMPRESSION:  [name] is a very pleasant 60 year old female with known  multivessel coronary artery disease, status post prior multiple  percutaneous interventions, who presents with recurrent angina pectoris  with typical/atypical features.  She was recently referred for a  pharmacologic stress test which was consistent with a medium-sized,  completely reversible apical/anterior defect suggestive of ischemia.  Calculated ejection fraction was 51% with normal wall motion.   Patient also has history of significant adverse reaction to Plavix  requiring substitution with Ticlid, after undergoing TAXUS stenting of  the left anterior descending in January 2005.   PLAN:  Following review with Dr. Willa Rough, recommendation is to  proceed with elective coronary angiography to rule out significant  coronary artery disease progression.  The patient is agreeable with this  plan and the risks/benefits were discussed, in conjunction with Dr.  Willa Rough.  Arrangements will be made for this to be scheduled in our  JV catheterization lab, in the next few days.   In the interim, patient will be provided with a new prescription for  sublingual nitroglycerin.  We will also start her on Imdur 30 mg daily.      Rozell Searing, PA-C  Electronically Signed      Luis Abed, MD, [facility]  Electronically Signed   GS/MedQ  DD: 02/18/2007  DT: 02/18/2007  Job #: [id number]   cc:   Doreen Beam

## 2010-07-11 NOTE — Cardiovascular Report (Signed)
[name], [name]              ACCOUNT NO.:  [id number]   MEDICAL RECORD NO.:  [id number]          PATIENT TYPE:  OIB   LOCATION:  2807                         FACILITY:  [facility]   PHYSICIAN:  [name] Fells. Excell Seltzer, MD  DATE OF BIRTH:  22-Jun-1950   DATE OF PROCEDURE:  02/21/2007  DATE OF DISCHARGE:                            CARDIAC CATHETERIZATION   PROCEDURE:  PTCA and stenting of the LAD, PTCA and stenting of the right  coronary artery.   INDICATION:  [name] is a 60 year old woman with known CAD.  She has  had prior percutaneous coronary intervention.  She presented with  classic angina, and underwent a diagnostic catheterization by Dr.  Gala [name] earlier today.  She was found to have severe mid-LAD stenosis  of 90%-95%.  This just beyond a large diagonal branch, and distal to a  previously placed stent in the LAD.  She also had a severe mid right  coronary artery stenosis of 90%.  After reviewing risks and indications  for PCI.  She was agreeable to proceed.  She was preloaded with 500 mg  of Ticlid.  The patient is PLAVIX allergic.   Risks and indications of the procedure were reviewed with the patient,  informed consent was obtained.  The right groin had a 4-French sheath in  place.  Using normal sterile technique, the 4-French sheath was changed  out for a 6-French sheath.  An XB-LAD 3.5 cm guide catheter was  inserted.  This was too short, and it was changed out for a 4 cm guide  catheter.  Angiomax was used for anticoagulation.  Once a therapeutic  ACT was achieved, a Cougar guidewire was passed into the distal LAD  without much difficulty.  The severe stenosis in the mid-LAD was  predilated with a 2-0 x 15-mm Maverick balloon to 8 atmospheres.  I  elected to stent across the diagonal from normal-to-normal vessel.  A  225 x 20 mm Taxus Adam stent was used, and was deployed at 14  atmospheres.  There was excellent stent expansion, and a good  angiographic result.  The  diagonal was not compromised.   I then postdilated with a 2.5 x 15-mm Quantum Maverick balloon to 16  atmospheres in the .  distal portion of the stent, and 20 atmospheres  into the proximal portion of the stent.  At the completion of procedure  there was TIMI 3 flow throughout the LAD, and the diagonal was widely  patent as well.   At that point, attention was turned to the right coronary artery.  A 6-  [place] JR-4 guide catheter was used.  There was severe stenosis in the  mid-right coronary artery that leads right up to a bifurcation of the  acute marginal branch in the right coronary.  The same Cougar guidewire  was passed into the acute marginal branch, and the lesion was predilated  with a 20 x 15 mm Maverick to 10 atmospheres.  I elected to stent that,  right to the bifurcation of those vessels with a 2.75 x 20 mm Taxus  stent.  This was deployed at  14 atmospheres.  The stent was then  postdilated with a 3 x 15 mm Quantum Maverick to 14 followed by 20  atmospheres in the more proximal portion of the stent.   At the completion of the procedure, the stent was well expanded.  There  was TIMI 3 flow.  However, there appeared to be a distal edge dissection  leading into the distal right coronary artery.  I took multiple views  and the appearance of the dissection persisted.  I elected to treat that  with a 2.25 x 12 mm Taxus Adam stent which was used to cross the acute  marginal branch, and overlap with the other stent.  It was deployed a 12  atmospheres.  This sealed the dissection plane.   I postdilated the overlap with a 2.5 x 15 mm Quantum Maverick to 14, and  then 20 atmospheres.  At the completion of the procedure, there was TIMI  3 flow throughout.  The acute marginal branch had ostial narrowing, but  TIMI 3 flow, and the patient had no chest pain.  I thought she had  received the maximum benefit of the procedure, and she was transferred  to the holding area in good  condition.   ASSESSMENT:  Successful 2-vessel PCI using drug-eluting stents in the  LAD and right coronary artery.   RECOMMEND:  Aspirin and Ticlid in combination for 12 months.  As stated  above, the patient is PLAVIX allergic.  She has tolerated Ticlid in the  past with previous stent placement.  She should have a CBC in 1 week,  followed by monthly CBCs, to monitor her blood counts.      [name] Fells. Excell Seltzer, MD  Electronically Signed     MDC/MEDQ  D:  02/21/2007  T:  02/21/2007  Job:  [id number]   cc:   [name] Abed, MD, [facility]

## 2010-07-14 NOTE — Cardiovascular Report (Signed)
NAME:  [name], [name]                        ACCOUNT NO.:  [id number]   MEDICAL RECORD NO.:  [id number]                   PATIENT TYPE:  OIB   LOCATION:  2899                                 FACILITY:  [facility]   PHYSICIAN:  Arturo Morton. Riley Kill, M.D.             DATE OF BIRTH:  1950-06-05   DATE OF PROCEDURE:  03/11/2003  DATE OF DISCHARGE:                              CARDIAC CATHETERIZATION   INDICATIONS:  This nice lady is a 60 year old who has previously undergone  percutaneous intervention.  She has developed recurrent classic angina  pectoris.  The intervention was done in 2001.  The current study was done to  assess coronary anatomy.   PROCEDURES:  1. Left heart catheterization.  2. Selective coronary arteriography.  3. Selective left ventriculography.  4. Percutaneous coronary intervention and stenting of the right coronary     artery.   DESCRIPTION OF PROCEDURE:  The patient was brought to the catheterization  lab and prepped and draped in the usual fashion.  Through an anterior  puncture, the right femoral artery was entered.  A 6 French sheath was  placed.  Views of the left and right coronary arteries were obtained in  multiple angiographic projections.  Central aortic and left ventricular  pressures were measured with a pigtail.  Ventriculography was performed in  the RAO projection.  I then called Dr. Myrtis Ser to the catheterization suite to  discuss the possibly options with both him and the patient.  The patient has  evidence of new lesions in the circumflex and LAD as well as very high grade  hazy lesion in the proximal right coronary.  The Cardiolite scan showed  anterolateral ischemia, but based upon the appearance of the angiogram we  were concerned about the right coronary artery and felt that we needed to  proceed with this vessel first.  Multivessel intervention versus coronary  artery bypass graft surgery was then discussed with both the patient and  subsequently  her husband.  I discussed with the patient the variable  outcomes associated with different strategies.  She does have three  treatable lesions.  Based on this, it was her desire to avoid bypass surgery  at 27.  Preparations were then made for percutaneous intervention.  Using a  6 [place] guide catheter, heparin and integrilin, and with an appropriate ACT  the right coronary artery was crossed with a High-Torque Floppy wire after  the ACT was documented to be 200.  The lesion was predilated using a 2.25 x  20-mm balloon.  The lesion was then stented using a 32 x 2.75 Taxus drug-  eluting stent.  Post dilatation was performed using a 3.25 PowerSail balloon  throughout the course of the stent with 8 atmosphere inflations on the  proximal and distal edges and 10-12 atmosphere inflations in the central  portion of the stent.  The patient did experience some chest pain and there  was sluggish flow in a proximal conus branch.  This improved by the  completion of the procedure.  She was given intracoronary nitroglycerin and  intravenous Verapamil.  There were no major complications.  The patient  received clopidogrel 300 mg at the completion of the procedure.   HEMODYNAMIC DATA:  1. Central aorta:  159/75.  2. Left ventricle:  154/13.  3. No gradient on pullback across the aortic valve.   ANGIOGRAPHIC DATA:  1. Ventriculography was performed in the RAO projection.  Overall ejection     fraction was felt to be excess of 60% because of ventricular ectopic     activity associated with catheter injection, a reliable sinus beat could     not be calculated.  No significant mitral regurgitation was demonstrated.  2. The left main coronary artery was free of critical disease.  3. The LAD coursed to the apex.  The LAD was fairly calcified proximally.     There is a fair amount of luminal irregularity.  Just prior to the     bifurcation of the major diagonal in the LAD itself is an eccentric 80-      90% stenosis.  In the LAD beyond the diagonal is an area of about 40% to     perhaps 50% mild plaquing and then there is moderate size vessel     distally.  The diagonal itself is a fairly large vessel that has     segmental plaquing of about 30-40% throughout its mid portion.  4. The circumflex has been previously stented.  There is a first marginal     that has about a 50% mid stenosis and has a typical diabetic appearance     being small in caliber.  There is 30-40% narrowing of the stent and then     no significant renarrowing of the distal stent bleeding into the marginal     other than about 30%.  Just past the second stent which telescopes into     the second  marginal is an area of narrowing of about 80%.  The AV     circumflex at its take off within the stent has about 50-70% narrowing     and supplies a very small posterior lateral segment.  5. The right coronary artery is a fairly large caliber vessel.  Just beyond     the origin, there is an area of segmental disease that measures about 90%     luminal reduction.  The vessel is calcified beyond this point and then     there is an eccentric 70-80% area.  More distally are several areas of     about 30-40% including at the bifurcation of the AV portion of the vessel     in the acute marginal branch.  However, these areas do not appear to be     critical and were also documented on a straight LAO shot.  Following     stenting, this area is reduced to 0% residual luminal narrowing in the     area of high grade narrowing.  There were no complications.   CONCLUSION:  1. Well preserved left ventricular function.  2. New high grade lesion in the mid left anterior descending artery and     circumflex artery distal to the previously placed stent.  3. High grade narrowing throughout the proximal mid right coronary artery     with successful percutaneous intervention of this area.  DISPOSITION:  We discussed the various  options which  include multivessel  intervention versus revascularization surgery.  It was the patient's  distinct desire to avoid revascularization surgery given her age.  The plan  is to proceed with the LAD some time next week as well as the circumflex  vessel.  She will be treated with drug-eluting stents and Plavix on a  continuous basis.                                               Arturo Morton. Riley Kill, M.D.    TDS/MEDQ  D:  03/11/2003  T:  03/11/2003  Job:  [id number]   cc:   Willa Rough, M.D.   Dr. Sula Soda, [place]

## 2010-07-14 NOTE — H&P (Signed)
NAME:  [name], [name]                        ACCOUNT NO.:  [id number]   MEDICAL RECORD NO.:  [id number]                   PATIENT TYPE:  INP   LOCATION:  6524                                 FACILITY:  [facility]   PHYSICIAN:  Charlies Constable, M.D.                  DATE OF BIRTH:  [birth year]   DATE OF ADMISSION:  03/15/2003  DATE OF DISCHARGE:                                HISTORY & PHYSICAL   PHYSICIANS:  Primary cardiologist, Jonelle Sidle, M.D.  Primary care physician, Dr. Maylon Peppers, [place], [place].   CHIEF COMPLAINT:  Chest pain.   HISTORY OF PRESENT ILLNESS:  [name] is a 60 year old female with known  coronary artery disease.  She was in the hospital from January 13 through  March 13, 2003, for chest pain.  At that time, she had a cardiac  catheterization and had PTCA and stent to the RCA.  Additionally, she has an  LAD lesion at 90% and an OM at 70%, and she was to come back for  percutaneous intervention to the LAD plus/minus the circumflex per MD.   On March 14, 2003, she had onset of substernal chest pain and went to her  local hospital in [place].  There, she had elevated troponins consistent  with MI, but her CK-MBs were normal.  Her EKG had no acute changes, and her  chest x-ray showed no active disease.  There was concern for an MI secondary  to her residual disease, and she was transferred to [facility] for  further evaluation and catheterization.  She is pain free at the time of  exam.   PAST MEDICAL HISTORY:  1. Hypertension.  2. Insulin-dependent diabetes.  3. Possible history of asthma.  4. Hyperlipidemia.  5. History of allergy to MORPHINE.  6. Status post stent x 2 to the circumflex, patent at last catheterization.  7. History of nonsustained VT during Cardiolite study in 2001.  8. History of allergic diaphysis.  9. Probable sleep apnea.  10.      History of breast cancer.   PAST SURGICAL HISTORY:  1. Cardiac catheterization.  2.  Right mastectomy.   SOCIAL HISTORY:  She lives in [place], [place] with her husband.  She has  ongoing tobacco use with one-half pack per day.  She works for [employer] in  [place], [place].  She does not abuse alcohol or drugs.   FAMILY HISTORY:  Noncontributory.   ALLERGIES:  CODEINE and CIPRO.   MEDICATIONS:  1. Toprol XL 50 mg 1-1/2 tablets daily.  2. Imdur 60 mg daily.  3. Zocor 20 mg daily.  4. Celexa 20 mg daily.  5. Novolin 70/30, 55 units b.i.d.  6. Glucotrol 5 mg daily while Glucovance is on hold.  7. History of Glucovance 5/500 b.i.d. on hold secondary to catheterization.  8. Altace 2.5 mg daily.  9. Aspirin 325 mg daily.  10.  Plavix 75 mg daily.  11.      Fish oil and vitamins.  12.      Sublingual nitroglycerin p.r.n.  13.      Allegra 180 mg daily.   REVIEW OF SYSTEMS:  Significant for pain at her previous catheterization  site on the right groin.  The patient also describes some fatigue which she  believes is due to multiple hospitalizations.  She denies any hematemesis,  hemoptysis, or melena.  She denies any recent fever or chills.  Review of  Systems is otherwise negative.   PHYSICAL EXAMINATION:  VITAL SIGNS:  Please see the handwritten note in the  chart.  GENERAL:  She is a well-developed, obese white female in no acute distress.  HEENT:  Normocephalic and atraumatic with pupils equal, round, and reactive  to light and accommodation.  Extraocular movements intact.  NECK:  No JVD, no thyromegaly.  No carotid bruits are appreciated.  CHEST:  Clear to auscultation bilaterally.  CARDIOVASCULAR:  Regular rate and rhythm with an S1 and S2, and no  significant murmur, rub, or gallop.  ABDOMEN: Soft and nontender with active bowel sounds.\  EXTREMITIES:  There is no cyanosis, clubbing, or edema.  There is a small  hematoma in the right groin at the previous catheterization site, but there  is no ecchymosis and no bruits appreciated.  Distal pulses are  2+ all four  extremities.  MUSCULOSKELETAL:  There is no joint deformities or effusions.  NEUROLOGIC:  She is alert and oriented with no focal deficits noted.   IMPRESSION AND PLAN:  1. Chest pain, unstable anginal pain with possible non-ST-elevation     myocardial infarction.  The patient is for cardiac catheterization today     with percutaneous intervention to the left anterior descending plus/minus     the circumflex per medical doctor.  2. The patient is otherwise stable and will be continued on her home     medications.      Theodore Demark, P.A. [facility]                  Charlies Constable, M.D.    RB/MEDQ  D:  03/15/2003  T:  03/15/2003  Job:  [id number]   cc:   Dr. Gregary Signs, Threasa Alpha, M.D. [facility]

## 2010-07-14 NOTE — Discharge Summary (Signed)
NAME:  [name], [name]                        ACCOUNT NO.:  [id number]   MEDICAL RECORD NO.:  [id number]                   PATIENT TYPE:  INP   LOCATION:  A322                                 FACILITY:  [facility]   PHYSICIAN:  [name] Hays. Dechurch, M.D.           DATE OF BIRTH:  08-07-1950   DATE OF ADMISSION:  12/04/2002  DATE OF DISCHARGE:  12/08/2002                                 DISCHARGE SUMMARY   DIAGNOSES:  1. Acute diverticulitis.  2. Nausea and vomiting, possibly related to Cipro.  3. Probable irritable bowel syndrome.  4. Lactose intolerance.  5. Diabetes mellitus.  6. Hypertension.  7. Coronary artery disease, status post percutaneous intervention.  8. Remote history of breast cancer.  9. Status post cholecystectomy.   DISPOSITION:  Patient discharged to home.   MEDICATIONS:  1. Bactrim DS 1 [name].o. b.i.d. to complete a 10-day course.  2. Novolin NPH.  The patient was instructed to gradually increase her     insulin to her previous dose.  3. Altace 2.5 daily.  4. GlucoVance 5/500 b.i.d.  5. Celexa 20 mg daily.  6. Aspirin 325 daily.  7. Toprol 50 mg daily.  8. LactAid or Lactinex __________ to assist with diarrhea.  9. Patient instructed to begin  Citrucel 2 tablespoons daily in water once     her diverticulitis resolves.   FOLLOW-UP:  Dr. Toni Amend as scheduled.   Return to work December 14, 2002.   DIET:  Instructions are given on low-residue diet and diverticulosis  maintenance.   HOSPITAL COURSE:  Pleasant 60 year old female who presented with a one-week  history of progressively worsening abdominal pain to the point that she  could no longer stand up or function at work.  Though she is normally  treated in [place], she presented to [facility] secondary to insurance  issues.  She had noted some nausea but no vomiting per se.  She had had some  chills but not documented fever.  Upon presentation she was quite  uncomfortable.  Evaluation revealed evidence of  subtle sigmoid and  mesenteric stranding and thickening of the left pelvic peritoneal reflection  consistent with mild diverticulitis.  The patient did spike a temperature to  101.  She was empirically treated with Cipro and Flagyl.  However, it was  noted after the third hospital day that every time she got the Cipro she  became nauseated and vomited.  This was discontinued, and she was actually  treated with Flagyl alone, and she had no further problems with the nausea  and vomiting.  She hemodynamically remained stable.  She tolerated her usual  medications.  Her GlucoVance had not been restarted here in the hospital.  The patient noted much improvement of her left abdominal pain though she was  still somewhat tender.  At the time of discharge she had no fever and was  tolerating a regular diet.  She is being discharged to  home with the plan as  noted above.  Her glucoses were not tightly controlled during the hospital  stay, but then again she was on a significantly decreased amount of insulin.  This was discussed with the patient, who had good understanding.  She was  discharged to home in stable condition.  At the time of discharge she is  alert and appropriate.  Blood pressure is 120/60, temperature is 98.6.  Lungs are clear.  Heart regular.  Abdomen is soft, slightly distended.  Some  mild tenderness in the left lower quadrant but much improved.  She has  active bowel sounds.     ___________________________________________                                         [name], M.D.   FED/MEDQ  D:  12/08/2002  T:  12/08/2002  Job:  [id number]   cc:   Doreen Beam  [address]  [place]  [place] [address]  Fax: [phone]

## 2010-07-14 NOTE — Discharge Summary (Signed)
NAME:  [name], SHAMON LOBO                        ACCOUNT NO.:  [id number]   MEDICAL RECORD NO.:  [id number]                   PATIENT TYPE:  OIB   LOCATION:  6523                                 FACILITY:  [facility]   PHYSICIAN:  Willa Rough, M.D.                  DATE OF BIRTH:  02-01-1951   DATE OF ADMISSION:  03/11/2003  DATE OF DISCHARGE:  03/13/2003                           DISCHARGE SUMMARY - REFERRING   PROCEDURES:  Coronary artery stenting on March 11, 2003.   REASON FOR ADMISSION:  Please refer to dictated admission note.   LABORATORY DATA:  CBC normal.  Normal electrolytes and renal function.  Glucose 287.   HOSPITAL COURSE:  The patient presented for her elective coronary angiogram,  performed by Dr. Shawnie Pons (see report for full details), revealing  severe three vessel coronary artery disease with a normal left ventricle.   Dr. Riley Kill proceeded with successful stenting (TAXUS) of a 90% proximal RCA  lesion to 0% residual stenosis.   Residual anatomy notable for a 90% proximal LAD and 70% mid circumflex  lesion.  Recommendation was to proceed with elective percutaneous  intervention of these two lesions early next week.   The patient did present with a previous known history of coronary artery  disease and previous percutaneous intervention.  She was noted to have  significant in-stent restenosis at two sites of the proximal circumflex,  proximal to the 70% mid lesion.   Left ventricular function was normal.   The patient did develop some chest discomfort the evening following her  procedure.  She was thus kept for overnight observation following the  procedure.  This responded to intravenous nitroglycerin, and the patient was  kept for overnight observation.   The patient was cleared to ambulate and had no recurrent chest discomfort  and was cleared for discharge the following morning.   Medications were adjusted following the development of chest  discomfort with  up titration of Toprol and initiation of Imdur.   The patient is scheduled to return on March 16, 2003, for elective  percutaneous intervention of the LAD and circumflex at 11:30 a.m.   DISCHARGE MEDICATIONS:  1. Toprol XL 75 mg daily.  2. Imdur 60 mg daily.  3. Zocor 20 mg q.h.s.  4. Celexa 20 mg daily.  5. __________.  6. Insulin 70/30 55 units b.i.d.  7. Altace 2.5 mg daily.  8. Coated aspirin 325 mg daily.  9. Plavix 75 mg daily.  10.      Glucotrol XL 5 mg daily.  11.      Nitro-Stat 0.4 mg p.r.n.  12.      Allegra 180 mg daily.   INSTRUCTIONS:  1. Do not resume Glucovance until further instructions.  2. No strenuous activity/driving for two days.  3. Low fat, low cholesterol diet.  4. Call office if there is any bleeding/swelling of the groin.   FOLLOWUP:  The patient is scheduled to return to [facility] on  Tuesday, March 16, 2003, for a scheduled cardiac catheterization at 11:30  a.m.   The patient will then return for office followup with Dr. Willa Rough in  [place].   DISCHARGE DIAGNOSES:  1. Severe three vessel coronary artery disease.     a. Status post stent (TAXUS) proximal right coronary artery on March 11, 2003.     b. Residual left anterior descending and circumflex disease, scheduled        for elective percutaneous coronary intervention on March 16, 2003.     c. Normal left ventricular function.  2. Hypertension.  3. Diabetes mellitus, insulin requiring.  4. Dyslipidemia.  5. MORPHINE allergy.      Gene Serpe, P.A. [facility]                      Willa Rough, M.D.    GS/MEDQ  D:  03/13/2003  T:  03/13/2003  Job:  [id number]   cc:   Doreen Beam  [address]  [place]  [place] [address]  Fax: [phone]

## 2010-07-14 NOTE — H&P (Signed)
NAME:  [name], [name]                       ACCOUNT NO.:  [id number]   MEDICAL RECORD NO.:  [id number]                   PATIENT TYPE:  OIB   LOCATION:                                       FACILITY:  [facility]   PHYSICIAN:  Willa Rough, M.D.                  DATE OF BIRTH:  [birth year]   DATE OF ADMISSION:  03/11/2003  DATE OF DISCHARGE:                                HISTORY & PHYSICAL   [name] is a 60 year old white female with a history of coronary artery  disease, status post previous intervention to the right coronary artery and  circumflex coronary artery with moderate nonobstructive disease in the left  anterior descending documented at [facility] in  December 2001.  She has been followed by our office and as of late she has  had symptoms compatible with her previous angina; therefore, she was set up  for a Cardiolite study for further evaluation.  February 24, 2003, she  underwent adenosine Cardiolite study.  She had equivocal ST segment changes  during adenosine infusion.  She did have chest, jaw, and arm discomfort.  There no specific arrhythmias reported.  She had a partially reversible  anterior defect also involving the lateral apex, consistent with ischemia.  Her ejection fraction was 51%.  Based on the patient's clinical history,  further evaluation was deemed necessary in the office.  On follow-up she  still complains of a substernal chest discomfort and a generalized fatigue,  which is similar to her previous symptoms when she had her intervention;  therefore, she is being admitted now for elective heart catheterization.  The patient agrees, understands, and accepts the risks and benefits of the  explained procedure.   PAST MEDICAL HISTORY:  1. Positive for coronary artery disease.  The patient underwent     catheterization with intervention at [facility]     February 03, 2000.  She had no significant disease in the LAD  system but     did have significant disease in the circumflex system.  The right     coronary artery had a 50% stenosis and in the ostial location had a 50%     stenosis proximally and then midvessel 50%.  Films were reviewed by the     cardiologist, Dr. Eugenia [name], at [facility], and they recommended     angioplasty.  She had two stents placed in the more distal location of     the circumflex marginal, and a stent was placed in the mid-circumflex.     The patient did have a dissection of the more proximal lesion after     balloon inflation.  2. She has a history of nonsustained ventricular tachycardia during a     Cardiolite study back in 2001.  3. Hypertension.  4. Diabetes mellitus.  5. She also has a history of breast cancer.  6. She also  has a history of asthma and allergic diathesis.   ALLERGIES:  CODEINE, which causes nausea.   CURRENT MEDICATIONS:  1. Toprol XL 50 mg daily.  2. Zocor 20 mg q.h.s.  3. Glucovance 5/500 mg b.i.d.  4. Celexa 20 mg daily.  5. Altace 2.5 mg daily.  6. Fish oil daily.  7. Vitamin B12 daily.  8. Aspirin 81 mg daily.  9. Allegra 180 mg daily.  10.      Insulin Novolin N 55 units in the morning and 55 units in the     evening.   FAMILY HISTORY:  Noncontributory.   SOCIAL HISTORY:  She is married.  She used to smoke tobacco but quit.  She  works for, I believe, [employer] in an administrative role.   REVIEW OF SYSTEMS:  As per HPI.  She has no history of nausea, vomiting,  hematochezia, odynophagia or dysphagia.  In talking with her, she does have  symptoms compatible with sleep apnea, which include easily somnolent, waking  up with a headache and not feeling rested, snoring with interruption in her  respiratory cycle per her husband.  She does not have restless legs.   PHYSICAL EXAMINATION:  VITAL SIGNS:  Weight is 181, pulse 74, blood pressure  142/60.  GENERAL:  She is a pleasant white female in no acute distress.  NECK:  Without JVD or  carotid bruits.  CHEST:  Lungs are clear to A&P.  CARDIAC:  Heart rhythm is regular without any significant murmur, rubs, or  clicks or anterior chest wall tenderness.  No S3 or S4 gallops are heard.  ABDOMEN:  Soft, nontender, bowel sounds are present.  No bruits are heard.  EXTREMITIES:  Pedal pulses are intact.   IMPRESSION:  1. Chest discomfort with symptoms compatible with her previous angina and     positive Cardiolite study for ischemia.  2. Coronary artery disease, status post intervention in 2001 at [place],     [place], to the right coronary artery and circumflex artery, with     moderate nonobstructive disease in her other vessels.  3. Normally-preserved left ventricular function.  4. History of nonsustained ventricular tachycardia during a Cardiolite study     back in 2001.  5. Hypertension.  6. Insulin-dependent diabetes mellitus.  7. History of asthma.  8. History of allergic diathesis.  9. Probable sleep apnea.   PLAN:  She will be admitted for elective heart catheterization on March 11, 2003.  We will hold her Glucovance and insulin on the a.m. of  catheterization.  Her Glucophage will be held 24 hours pre-catheterization  and 48 hours  post catheterization.  We will increase her aspirin from 81 mg to 325 mg  daily.  Will start her on Imdur 30 mg daily pending her catheterization  date.  She knows to come to the emergency room or call our office if she has  substernal chest discomfort not relieved with nitroglycerin.      Suszanne Conners. Julious Oka.                    Willa Rough, M.D.    MRD/MEDQ  D:  03/03/2003  T:  03/03/2003  Job:  [id number]   cc:   [facility]  [address]  [place]  [place] [address]  Fax: [phone]

## 2010-07-14 NOTE — Cardiovascular Report (Signed)
NAME:  [name], [name]                        ACCOUNT NO.:  [id number]   MEDICAL RECORD NO.:  [id number]                   PATIENT TYPE:  INP   LOCATION:  6524                                 FACILITY:  [facility]   PHYSICIAN:  Charlies Constable, M.D.                  DATE OF BIRTH:  April 18, 1950   DATE OF PROCEDURE:  03/15/2003  DATE OF DISCHARGE:  03/16/2003                              CARDIAC CATHETERIZATION   CLINICAL HISTORY:  Ms. Kun is 60 years old and is a diabetic and lives in  [place], [place].  She also is a family friend of Salley Hews.  She has  had two stents in the circumflex artery in the past and recently developed  angina and had an abnormal Cardiolite and was brought in as an outpatient  last Thursday, and Dr. Riley Kill performed angiography and stented the  proximal right coronary artery.  She was scheduled to come back this week  for our staged intervention on the LAD and circumflex artery but developed  recurrent chest pain at home, and her troponins were positive, and she was  transferred here for intervention.   PROCEDURE:  The procedure was performed via the right femoral artery  utilizing an arterial sheath and 6-French preformed coronary catheters. We  first took a picture of the right coronary artery to assess the stent placed  last week.  We then used a 6-French Q3.5 guiding catheter with sideholes and  a PT2 life-support wire.  The patient was given weight-adjusted heparin  __________ an ECT at greater than 200 seconds and was given double-bolus  Integrilin and infusion and had been on Plavix.  We first approached the LAD  and navigated the PT2 wire down across the LAD.  We predilated with a 2.75 x  15 mm Quantum Maverick performing 1 inflation up to 10 atm for 30 seconds.  We then deployed a 2.75 x 16 mm TAXUS stent deploying this with 1 inflation  of 14 atm for 30 seconds.  Repeat diagnostic studies were then performed  through the guiding catheter.   We  then approached the circumflex artery.  We rewired the lesion with the  same PT2 life-support wire.  We initially used a 2.25 x 20 mm Maverick  balloon and performed 2 inflations up to 7 atm for 30 seconds.  This gave a  suboptimal result, so we decided to stent the lesion.  The vessel was too  small to take a 2.5 drug-eluting stent so we used a 2.25 x 18 mm Pixel  stent.  We deployed this with 1 inflation of 10 atm for 30 seconds.  We only  went to 10 atm because we were concerned about oversizing the stent at the  distal reference area.  We then postdilated with a 2.25 x 15 mm Quantum  Maverick, performing 2 inflations up to 14 atm for 30 seconds.  We also  dilated just  proximal to where we had positioned the stent within an old  stent where there was about a 50% narrowing.  Repeat diagnostic study was  then performed through the guiding catheter.  The patient tolerated the  procedure well and left the laboratory in satisfactory condition.   RESULTS:  The stent in the right coronary artery was widely patent with 0%  stenosis.   The left anterior descending artery had a 90% stenosis in its proximal  portion.  Following stenting, this improved to 0%.  This was located just  before a diagonal branch, and we ended the stent just before the diagonal  branch.   The circumflex obtuse marginal branch had a 90% stenosis just distal to the  2 stents.  Following stenting this improved from 90% to 0%.   CONCLUSION:  1. Successful treatment of the stenosis in the proximal left anterior     descending with a TAXUS stent with improvement in the center of narrowing     from 90% to 0%.  2. Successful stenting of the lesion in the circumflex marginal vessel     improving the center of narrowing from 90% to 0% with the bare-metal     stent.   DISPOSITION:  The patient was returned to post anesthesia unit for further  observation.                                               Charlies Constable,  M.D.    BB/MEDQ  D:  03/15/2003  T:  03/16/2003  Job:  [id number]   cc:   Doreen Beam  [address]  [place] [address]  Fax: [phone]   Willa Rough, M.D.   [facility]

## 2010-07-14 NOTE — Discharge Summary (Signed)
NAME:  [name], [name]                        ACCOUNT NO.:  [id number]   MEDICAL RECORD NO.:  [id number]                   PATIENT TYPE:  INP   LOCATION:  6524                                 FACILITY:  [facility]   PHYSICIAN:  Charlies Constable, M.D.                  DATE OF BIRTH:  08-Apr-1950   DATE OF ADMISSION:  03/15/2003  DATE OF DISCHARGE:  03/16/2003                           DISCHARGE SUMMARY - REFERRING   PROCEDURES:  Coronary artery stenting January 17.   REASON FOR ADMISSION:  Please refer to dictated admission note.   LABORATORY DATA:  Hemoglobin 12.4, hematocrit 35, platelets 118 at  discharge.  Sodium 134, potassium 3.8, glucose 214, BUN 11, creatinine 0.7  at discharge.   HOSPITAL COURSE:  The patient had a recent coronary angiogram revealing  significant three-vessel coronary artery disease with normal left  ventricular function and underwent stenting of the proximal RCA on January  13.  Plan was to bring her back for elective percutaneous intervention of  the remaining two significant lesions of the circumflex and LAD.   Following discharge on January 15, however, the patient presented to  [facility] on January 16 with complaints of chest pain, nausea, and  vomiting.  Although troponins were elevated, CPK-MBs were normal.  No acute  changes were noted by electrocardiogram.  A chest x-ray was negative as  well.  She was stabilized and transferred to [facility].  The patient was  maintained on Integrillin, and arrangements were made for cardiac  catheterization later that same day.   Procedure was performed by Dr. Juanda Chance (see report for full details) with  successful placement of both a TAXUS stent and a Multi-Link stent with  treatment of the 90% LAD and 90% circumflex/obtuse marginal lesions to 30%  residual stenosis.   Right femoral artery was closed with AngioPhyl, and the patient was kept for  overnight observation.  There were no noted complications the  following  morning,and the patient was hemodynamically stable with no complaints of  chest pain.  She was thus cleared for discharge.   The patient will need to resume Glucophage for 48 hours following discharge.  Followup CBC is also recommended for continued monitoring of mild  thrombocytopenia.   DISCHARGE MEDICATIONS:  1. Plavix 75 mg daily (only 6 months).  2. Toprol XL 5 mg daily.  3. Zocor 20 mg daily.  4. Insulin 70/30, 55 units b.i.d.  5. Celexa 20 mg daily.  6. Altace 2.5 mg daily.  7. Coated aspirin 325 mg daily.  8. Glucotrol XL 5 mg daily.  9. Allegra 180 mg daily.  10.      Nitrostat 0.4 mg p.r.n.   DISCHARGE INSTRUCTIONS:  1. The patient is to resume Glucophage on Thursday, January 20.  She can     then discontinue Glucotrol.  2. The patient is to refrain from any heavy lifting/driving x 2 days.  3. The  patient may return to work on Monday, January 24.  4. The patient is to maintain low-fat, low-cholesterol, diabetic diet.  5. The patient is to call the [facility] office [phone]) if there is any     swelling/bleeding  of the groin.  6. The patient is scheduled to follow up with Willa Rough, M.D. on Tuesday,     February 1, at 10:30 a.m. at our [facility].  She will need a followup     CBC at that time.   DISCHARGE DIAGNOSES:  1. Severe three-vessel coronary artery disease.     a. Status post stenting left anterior descending artery and        circumflex/obtuse marginal January 17.     b. Status post stenting of right coronary artery March 11, 2003.     c. Normal left ventricular function.  2. Insulin-requiring diabetes mellitus.  3. Hypertension.  4. Dyslipidemia.  5. Thrombocytopenia.      Gene Serpe, P.A. [facility]                      Charlies Constable, M.D.    GS/MEDQ  D:  03/16/2003  T:  03/16/2003  Job:  [id number]

## 2010-07-14 NOTE — Assessment & Plan Note (Signed)
[facility]                          [facility] NOTE   NAME:Vitullo, JEROLYN FLENNIKEN                     MRN:          [id number]  DATE:01/31/2006                            DOB:          1950/07/26    PRIMARY CARE PHYSICIAN:  Dr. Doreen Beam.   REASON FOR VISIT:  Routine cardiac followup.   HISTORY OF PRESENT ILLNESS:  Ms. Delaine was last seen in the office in  December 2005 by Arnette Felts. She has a history of coronary artery  disease status post previous percutaneous coronary intervention to the  right coronary artery and circumflex in 2001 with more recent placement  of a drug-eluting stent in the left anterior descending in January 2005.  She had actually done fairly well on medical therapy, denying any  progressive angina or dyspnea on exertion. I do note a prior history of  nonsustained ventricular tachycardia during a Cardiolite study although  in the setting of a normal left ventricular ejection fraction and this  has been stable without any obvious recurrence on beta blocker therapy.  She denies any problems with palpitations or syncope. Her  electrocardiogram  today showed sinus rhythm with nonspecific ST-T wave  changes. Her medications are outlined below and we discussed these  today. I note that she is no longer taking a statin medication,  previously on Zocor. She apparently had some difficulties tolerating  this.   She has had some problems with her rotator cuff on the right and there  was a question as to whether she might need surgery although at this  point it sounds as if she is going to try physical therapy first and  there are no active plans for proceeding with an operation at this time.  She tells me that through the use of Chantix she has been able to stop  smoking and quit approximately 6 months ago.   ALLERGIES:  CODEINE and PLAVIX.   CURRENT MEDICATIONS:  1. Toprol XL 75 mg p.o. daily.  2. Glucovance 5/500 mg  p.o. b.i.d.  3. Altace 2.5 mg p.o. daily.  4. Novolin-N 70 units subcu q.a.m. and subcu q.p.m.  5. Fish oil supplements.  6. Enteric coated aspirin 325 mg p.o. daily.  7. Nitrostat 0.4 mg sublingual p.r.n. (has not needed).   REVIEW OF SYSTEMS:  As described in the history of present illness. She  has had no bleeding problems. She has had no orthopnea, claudication or  lower extremity edema.   PHYSICAL EXAMINATION:  VITAL SIGNS:  Blood pressure today is 162/76,  heart rate is 65, weight is 177.8 pounds which is stable.  GENERAL:  The patient is comfortable and in no acute distress.  HEENT:  Conjunctiva was normal, pharynx is clear.  NECK:  Supple without jugular venous distention or loud bruits. No  thyromegaly is noted.  LUNGS:  Clear without labored breathing at rest.  CARDIAC:  Reveals a regular rate and rhythm with soft basal systolic  murmur without radiation. No S3, gallop or pericardial rub is noted.  ABDOMEN:  Soft, no loud bruit. Bowel sounds are present.  EXTREMITIES:  Show no significant pitting edema.   IMPRESSION/RECOMMENDATIONS:  1. History of coronary artery disease status post remote intervention      to the right coronary artery and circumflex with more recent      placement of a drug-eluting stent in January 2005 in the left      anterior descending. She has normal left ventricular ejection      fraction by previous studies and is not manifesting any symptoms of      active angina or congestive heart failure. I note a PLAVIX allergy      and she continues on aspirin without any difficulties. In light of      the fact that she is not planning any imminent surgery for her      shoulder, we will hold off on any repeat stress testing for the      time being. I would like her to return over the next year however      for a followup screening evaluation. If she plans to proceed with      surgery in the interim, she will let us know and we can arrange      testing more  quickly. I did not make any medication changes today.      I have asked her to followup with Dr. Toni Amend for her blood      pressure. Certainly if this remains elevated, one could consider      advancing Altace dosing. I also note that she is not on a statin      medication. Her goal LDL cholesterol should be in the 70-80 range      and it may be worth considering trial of a different statin      medication given her prior intolerance to Zocor.  2. Further plans to follow.     Jonelle Sidle, MD  Electronically Signed    SGM/MedQ  DD: 01/31/2006  DT: 01/31/2006  Job #: [id number]

## 2010-07-14 NOTE — H&P (Signed)
NAME:  [name], [name]                        ACCOUNT NO.:  [id number]   MEDICAL RECORD NO.:  [id number]                   PATIENT TYPE:  INP   LOCATION:  A322                                 FACILITY:  [facility]   PHYSICIAN:  Hanley Hays. Dechurch, M.D.           DATE OF BIRTH:  Jul 13, 1950   DATE OF ADMISSION:  12/04/2002  DATE OF DISCHARGE:                                HISTORY & PHYSICAL   HISTORY OF PRESENT ILLNESS:  A 60 year old Caucasian female from [place],  [place] where she receives all of her medical care who presents to our  hospital secondary to [employer] issues with a one-week history of  progressive lower abdominal pain and back pain.  It had gotten so bad over  the last 24 hours that her husband finally insisted she come to the  hospital.  She had noted some nausea but no vomiting.  Her p.o. intake had  been poor.  Her bowel movements had been regular with some constipation  which was not unusual.  She had eaten popcorn the night prior to  presentation though the pain was present prior to this.  She has had no  hematochezia or diarrhea, no mucous, no fever or chills.  Normally she is  quite active and without complaint.  In the emergency room she was noted to  be in significant pain.  She received Toradol then Dilaudid and developed  some nausea, and then a second dose of Dilaudid.  The pain did decrease.  She is now quite sedated, unable to give much in the way of history.  She  did have some significant nausea and vomiting in the emergency room.  It was  felt that she would not be able to tolerate the p.o. antibiotics.  Therefore, she is admitted for IV fluid and pain control.  History is  obtained from her spouse primarily.   PAST MEDICAL HISTORY:  1. Coronary artery disease status post stent three to five years ago.  2. Diverticulosis with a history of diverticulitis about six to seven years     ago.  3. Breast cancer status post right mastectomy about 12  years ago.  4. Diabetes mellitus, control unknown.  5. Cholecystectomy.  6. Hypertension.   SOCIAL HISTORY:  The patient smokes one pack per day, alcohol on a social  basis.  She is a Community education officer.  She is married and has two  daughters.   MEDICATIONS:  1. Altace 2.5 daily.  2. Glucovance 5/500 b.i.d.  3. Toprol-XL 50 mg daily.  4. Celexa 20 mg daily.  5. NPH 55 units b.i.d.   ALLERGIES:  MORPHINE and CODEINE which induce nausea and vomiting.   REVIEW OF SYSTEMS:  In addition to the above, usually not constipated.  No  recent chest pain, shortness of breath.  No change in her exercise  tolerance.  Overall has been doing reasonably well.  Husband is unable  to  provide primary care [name] name with the exception of Dr. Toni Amend who is  her generalist.   PHYSICAL EXAMINATION:  GENERAL:  Reveals a well-developed, well-nourished,  slightly overweight female, no distress.  VITAL SIGNS:  Blood pressure 155/72, she is afebrile, pulse is 70 and  regular, respirations are snoring.  She does arouse a bit but falls back to  sleep.  She appears to be in no distress.  Respiratory rate is about 16.  NECK:  Supple.  No JVD, no thyromegaly.  LUNGS:  Clear to auscultation anterior and posterior.  HEART:  Regular, no murmurs noted.  ABDOMEN:  Slightly distended, bowel sounds are diminished but present.  The  belly is soft with some tenderness across the lower abdomen, particularly on  the left.  EXTREMITIES:  Without clubbing, cyanosis, or edema.  She has good distal  pulses.  She moves all extremities x4.  Exam is somewhat limited secondary  to sedation.   LABORATORY DATA:  Hemoglobin 13.1, hematocrit 37, white count 6.8, 47 segs,  44 lymphs.  Sodium 134, potassium 3.8, BUN 13, creatinine 0.8.  Liver  functions normal.  UA unremarkable.  CT revealed diverticulitis changes of  the left lower sigmoid colon.   ASSESSMENT AND PLAN:  1. Sigmoid diverticulitis with persistent  pain, nausea, and vomiting; unable     to tolerate oral intake.  Will give some bowel rest and IV Cipro and     Flagyl and analgesics IV until she is taking p.o.  2. Diabetes mellitus.  Continue her insulin but decrease dose.  Hold the     Glucophage for the next 48 hours. I am going to hold her aspirin for 24     hours actually as well.  Sliding scale insulin until taking p.o.  3. History of coronary artery disease status post percutaneous intervention     and has been told she has had an MI in the past.  No symptoms presently.     No evidence of heart failure.  Will continue current level of supportive     care.  4. Remote history of breast cancer.  No obvious evidence of disease.   Should her nausea and vomiting persist, further workup is indicated.  The  plan was discussed with her husband who is in agreement.      ___________________________________________                                         Hanley Hays. Josefine Class, M.D.   FED/MEDQ  D:  12/05/2002  T:  12/05/2002  Job:  [id number]

## 2010-12-01 LAB — BASIC METABOLIC PANEL
CO2: 24
Creatinine, Ser: 0.74
Glucose, Bld: 285 — ABNORMAL HIGH
Potassium: 3.5

## 2010-12-01 LAB — CBC
HCT: 34.9 — ABNORMAL LOW
Platelets: 172
RDW: 12
WBC: 4.2

## 2011-01-01 ENCOUNTER — Telehealth: Payer: Self-pay | Admitting: Cardiology

## 2011-01-01 NOTE — Telephone Encounter (Signed)
Pt last seen August 22, 2009 by Gene. Pt was to f/u in 3 weeks but was never seen back in the office.   Pt now calls stating over the weekend she had some severe jaw pain. She states one day last week she had tightness in her chest. She has never felt this before. She took NTG this weekend on 2 separate occasions for jaw pain w/relief. She has also had SOB. She denies chest tightness or jaw pain today but states she has laid around and not really done anything.   Pt will see Dr. Diona Browner in [place] on Thursday. She is aware of this appt date, time and location and is agreeable. She is aware to go to ER for any chest pain, pressure, tightness or jaw pain prior to this appt.

## 2011-01-01 NOTE — Telephone Encounter (Signed)
Jaw pain (bilateral) tightness in chest for approximately one week.  Having some shortness of breath. Has not seen a doctor.

## 2011-01-03 ENCOUNTER — Encounter: Payer: Self-pay | Admitting: Cardiology

## 2011-01-04 ENCOUNTER — Ambulatory Visit (INDEPENDENT_AMBULATORY_CARE_PROVIDER_SITE_OTHER): Payer: Medicare HMO | Admitting: Cardiology

## 2011-01-04 ENCOUNTER — Encounter: Payer: Self-pay | Admitting: Cardiology

## 2011-01-04 ENCOUNTER — Encounter (HOSPITAL_COMMUNITY): Payer: Self-pay | Admitting: Pharmacy Technician

## 2011-01-04 ENCOUNTER — Other Ambulatory Visit: Payer: Self-pay

## 2011-01-04 VITALS — BP 160/73 | HR 59 | Ht 65.0 in | Wt 188.0 lb

## 2011-01-04 DIAGNOSIS — I10 Essential (primary) hypertension: Secondary | ICD-10-CM

## 2011-01-04 DIAGNOSIS — I2510 Atherosclerotic heart disease of native coronary artery without angina pectoris: Secondary | ICD-10-CM

## 2011-01-04 DIAGNOSIS — I200 Unstable angina: Secondary | ICD-10-CM

## 2011-01-04 DIAGNOSIS — E785 Hyperlipidemia, unspecified: Secondary | ICD-10-CM

## 2011-01-04 LAB — CBC
Platelets: 128 10*3/uL — ABNORMAL LOW (ref 150–400)
RDW: 12.9 % (ref 11.5–15.5)
WBC: 4.6 10*3/uL (ref 4.0–10.5)

## 2011-01-04 LAB — PROTIME-INR: INR: 0.99 (ref <1.50)

## 2011-01-04 LAB — BASIC METABOLIC PANEL
Chloride: 101 mEq/L (ref 96–112)
Creat: 0.84 mg/dL (ref 0.50–1.10)
Potassium: 5 mEq/L (ref 3.5–5.3)
Sodium: 139 mEq/L (ref 135–145)

## 2011-01-04 NOTE — Assessment & Plan Note (Signed)
Reports compliance with medical therapy.

## 2011-01-04 NOTE — Assessment & Plan Note (Signed)
Multivessel with prior documentation of normal LVEF, as outlined above.

## 2011-01-04 NOTE — Assessment & Plan Note (Signed)
Symptoms consistent with unstable angina as outlined. ECG with progressive, nonspecific ST-T changes since last year as well. After reviewing the options, plan is to arrange a diagnostic cardiac catheterization for tomorrow with Dr. Swaziland, to best assess her coronary anatomy for progression in CAD, and evaluate for optimal revascularization strategies. She essentially has multivessel disease with diabetes, and has had stent placement in each vascular distribution over the years. Plavix allergy is noted (hives), and plan is to initiate Brilinta starting today, reducing aspirin to 81 mg daily. Otherwise continue present regimen, follow up lab work, further plans to follow pending her testing tomorrow.

## 2011-01-04 NOTE — Patient Instructions (Signed)
Your physician recommends that you schedule a follow-up appointment in: 1 month with Dr Diona Browner  Your physician has requested that you have a cardiac catheterization. Cardiac catheterization is used to diagnose and/or treat various heart conditions. Doctors may recommend this procedure for a number of different reasons. The most common reason is to evaluate chest pain. Chest pain can be a symptom of coronary artery disease (CAD), and cardiac catheterization can show whether plaque is narrowing or blocking your heart's arteries. This procedure is also used to evaluate the valves, as well as measure the blood flow and oxygen levels in different parts of your heart. For further information please visit [email or web address] Please follow instruction sheet, as given.  Your physician recommends that you return for lab work in: Today (CBC, BMET, PT/INR)   Your physician has recommended you make the following change in your medication:   Start Brilinta 90 mg twice a day Decrease Aspirin to 81 mg  Report to Short Stay at [facility] at 1230 for a 230 cath with Dr Peter Swaziland You may have a clear liquid breakfast, then nothing afterwards. Take 1/2 dose of insulin this evening and hold in the am Hold Metformin in the am and 48 hours after cath

## 2011-01-04 NOTE — Progress Notes (Signed)
Clinical Summary Ms. Ruark is a 60 y.o.female presenting for followup. She was last seen in June of 2011 by Mr. Serpe in our [place] office. Cardiac history is noted below.  She is here today with her husband, reporting progressive dyspnea on exertion over a period of months, NYHA class 2-3, and also more frequent episodes of angina. She describes a tightness in her jaw, chest pain with exertion, such as climbing a flight of stairs from her basement. Within the last 2 weeks she has had episodes of resting jaw discomfort, improved with nitroglycerin.  She has not undergone interval ischemic testing since her prior coronary interventions in 2008. Importantly, she has a documented Plavix allergy (hives) and was previously on Ticlid with her interventions.  Otherwise she reports compliance with her medications, states that she sees an endocrinologist in [place], [place] and has not seen her primary care provider for over a year.  Today we reviewed options for further evaluation including both noninvasive and invasive techniques, and after review of the risk and benefit profile, plan is to proceed with a diagnostic cardiac catheterization.   Allergies  Allergen Reactions  . Clopidogrel Bisulfate   . Codeine   . Morphine     Medication list reviewed.  Past Medical History  Diagnosis Date  . Coronary atherosclerosis of native coronary artery     BMS circ 2001, DES LAD 2005, DES LAD and RCA 2008  . Drug allergy     Allergy to Plavix (hives) - took Ticlid in past  . Essential hypertension, benign   . Mixed hyperlipidemia   . Type 2 diabetes mellitus   . Breast cancer     Chemo and mastectomy    Past Surgical History  Procedure Date  . Right mastectomy   . Abdominal hysterectomy     History reviewed. No pertinent family history.  Social History Ms. Hannay reports that she quit smoking about 3 years ago. Her smoking use included Cigarettes. She has never used smokeless tobacco. Ms.  Perriello reports that she does not drink alcohol.  Review of Systems No palpitations or syncope. No reported bleeding problems. No orthopnea or PND. Stable appetite. Otherwise reviewed and negative.  Physical Examination Filed Vitals:   01/04/11 0924  BP: 160/73  Pulse: 59   Overweight woman in no acute distress, no active chest pain. HEENT: Conjunctiva and lids normal, oropharynx with moist mucosa. Neck: Supple, no elevated JVP or carotid bruits. Lungs: Clear to auscultation, nonlabored. Cardiac: Regular rate and rhythm, no rub or S3. Soft S4. Abdomen: Soft, nontender, bowel sounds present. Skin: Warm and dry. Musculoskeletal: No kyphosis. Extremities: No pitting edema, distal pulses full. Neuropsychiatric: Alert and oriented x3, affect appropriate.  ECG Sinus rhythm with inferolateral ST/T changes - downward sloping with inversions. Progressed since prior tracing 6/11.    Problem List and Plan

## 2011-01-04 NOTE — Assessment & Plan Note (Signed)
On statin therapy 

## 2011-01-04 NOTE — Progress Notes (Signed)
Addended by: Reather Laurence A on: 01/04/2011 11:00 AM   Modules accepted: Orders

## 2011-01-04 NOTE — Assessment & Plan Note (Signed)
Followed by an endocrinologist in [place], [place].

## 2011-01-05 ENCOUNTER — Ambulatory Visit (HOSPITAL_COMMUNITY): Payer: Managed Care, Other (non HMO)

## 2011-01-05 ENCOUNTER — Encounter (HOSPITAL_COMMUNITY): Payer: Self-pay

## 2011-01-05 ENCOUNTER — Inpatient Hospital Stay (HOSPITAL_COMMUNITY)
Admission: RE | Admit: 2011-01-05 | Discharge: 2011-01-25 | DRG: 233 | Disposition: A | Discharge status: Home Health | Payer: Managed Care, Other (non HMO) | Source: Ambulatory Visit | Attending: Cardiothoracic Surgery | Admitting: Cardiothoracic Surgery

## 2011-01-05 ENCOUNTER — Encounter (HOSPITAL_COMMUNITY): Payer: Self-pay | Admitting: General Practice

## 2011-01-05 ENCOUNTER — Encounter (HOSPITAL_COMMUNITY)
Admission: RE | Disposition: A | Discharge status: Home Health | Payer: Self-pay | Source: Ambulatory Visit | Attending: Cardiothoracic Surgery

## 2011-01-05 DIAGNOSIS — Z853 Personal history of malignant neoplasm of breast: Secondary | ICD-10-CM

## 2011-01-05 DIAGNOSIS — IMO0001 Reserved for inherently not codable concepts without codable children: Secondary | ICD-10-CM | POA: Diagnosis present

## 2011-01-05 DIAGNOSIS — Z9861 Coronary angioplasty status: Secondary | ICD-10-CM

## 2011-01-05 DIAGNOSIS — I200 Unstable angina: Secondary | ICD-10-CM | POA: Diagnosis present

## 2011-01-05 DIAGNOSIS — I2510 Atherosclerotic heart disease of native coronary artery without angina pectoris: Principal | ICD-10-CM | POA: Diagnosis present

## 2011-01-05 DIAGNOSIS — E782 Mixed hyperlipidemia: Secondary | ICD-10-CM | POA: Diagnosis present

## 2011-01-05 DIAGNOSIS — R570 Cardiogenic shock: Secondary | ICD-10-CM | POA: Diagnosis not present

## 2011-01-05 DIAGNOSIS — D696 Thrombocytopenia, unspecified: Secondary | ICD-10-CM | POA: Diagnosis not present

## 2011-01-05 DIAGNOSIS — I214 Non-ST elevation (NSTEMI) myocardial infarction: Secondary | ICD-10-CM | POA: Diagnosis not present

## 2011-01-05 DIAGNOSIS — I6529 Occlusion and stenosis of unspecified carotid artery: Secondary | ICD-10-CM | POA: Diagnosis present

## 2011-01-05 DIAGNOSIS — D62 Acute posthemorrhagic anemia: Secondary | ICD-10-CM | POA: Diagnosis not present

## 2011-01-05 DIAGNOSIS — I10 Essential (primary) hypertension: Secondary | ICD-10-CM | POA: Diagnosis present

## 2011-01-05 DIAGNOSIS — E785 Hyperlipidemia, unspecified: Secondary | ICD-10-CM | POA: Diagnosis present

## 2011-01-05 DIAGNOSIS — E871 Hypo-osmolality and hyponatremia: Secondary | ICD-10-CM | POA: Diagnosis not present

## 2011-01-05 DIAGNOSIS — I4891 Unspecified atrial fibrillation: Secondary | ICD-10-CM | POA: Diagnosis not present

## 2011-01-05 HISTORY — PX: LEFT HEART CATHETERIZATION WITH CORONARY ANGIOGRAM: SHX5451

## 2011-01-05 HISTORY — DX: Depression, unspecified: F32.A

## 2011-01-05 HISTORY — DX: Major depressive disorder, single episode, unspecified: F32.9

## 2011-01-05 HISTORY — DX: Peptic ulcer, site unspecified, unspecified as acute or chronic, without hemorrhage or perforation: K27.9

## 2011-01-05 HISTORY — DX: Polyneuropathy, unspecified: G62.9

## 2011-01-05 LAB — GLUCOSE, CAPILLARY
Glucose-Capillary: 201 mg/dL — ABNORMAL HIGH (ref 70–99)
Glucose-Capillary: 137 mg/dL — ABNORMAL HIGH (ref 70–99)
Glucose-Capillary: 132 mg/dL — ABNORMAL HIGH (ref 70–99)

## 2011-01-05 SURGERY — LEFT HEART CATHETERIZATION WITH CORONARY ANGIOGRAM
Anesthesia: LOCAL

## 2011-01-05 MED ORDER — ALUM & MAG HYDROXIDE-SIMETH 200-200-20 MG/5ML PO SUSP
15.0000 mL | ORAL | Status: DC | PRN
Start: 2011-01-05 — End: 2011-01-15
  Filled 2011-01-05: qty 30

## 2011-01-05 MED ORDER — HEPARIN (PORCINE) IN NACL 100-0.45 UNIT/ML-% IJ SOLN
1000.0000 [IU]/h | INTRAMUSCULAR | Status: DC
  Administered 2011-01-06: 1000 [IU]/h via INTRAVENOUS
  Filled 2011-01-05: qty 250

## 2011-01-05 MED ORDER — HEPARIN (PORCINE) IN NACL 2-0.9 UNIT/ML-% IJ SOLN
INTRAMUSCULAR | Status: AC
  Filled 2011-01-05: qty 2000

## 2011-01-05 MED ORDER — SIMVASTATIN 20 MG PO TABS
20.0000 mg | ORAL_TABLET | Freq: Every day | ORAL | Status: DC
  Administered 2011-01-06 – 2011-01-10 (×5): 20 mg via ORAL
  Filled 2011-01-05 (×9): qty 1

## 2011-01-05 MED ORDER — ASPIRIN 325 MG PO TABS
325.0000 mg | ORAL_TABLET | Freq: Every day | ORAL | Status: DC
  Administered 2011-01-06 – 2011-01-13 (×7): 325 mg via ORAL
  Filled 2011-01-05 (×9): qty 1

## 2011-01-05 MED ORDER — METOPROLOL TARTRATE 50 MG PO TABS
50.0000 mg | ORAL_TABLET | Freq: Two times a day (BID) | ORAL | Status: DC
  Administered 2011-01-06 – 2011-01-11 (×12): 50 mg via ORAL
  Filled 2011-01-05 (×17): qty 1

## 2011-01-05 MED ORDER — LISINOPRIL 10 MG PO TABS
10.0000 mg | ORAL_TABLET | Freq: Every day | ORAL | Status: DC
  Administered 2011-01-06 – 2011-01-10 (×5): 10 mg via ORAL
  Filled 2011-01-05 (×7): qty 1

## 2011-01-05 MED ORDER — NITROGLYCERIN 0.2 MG/ML ON CALL CATH LAB
INTRAVENOUS | Status: AC
  Filled 2011-01-05: qty 1

## 2011-01-05 MED ORDER — ASPIRIN 81 MG PO CHEW
324.0000 mg | CHEWABLE_TABLET | ORAL | Status: AC
  Administered 2011-01-05: 324 mg via ORAL
  Filled 2011-01-05: qty 4

## 2011-01-05 MED ORDER — GUAIFENESIN-DM 100-10 MG/5ML PO SYRP
15.0000 mL | ORAL_SOLUTION | ORAL | Status: DC | PRN
  Filled 2011-01-05: qty 15

## 2011-01-05 MED ORDER — TICAGRELOR 90 MG PO TABS
90.0000 mg | ORAL_TABLET | Freq: Two times a day (BID) | ORAL | Status: DC
  Filled 2011-01-05: qty 1

## 2011-01-05 MED ORDER — SODIUM CHLORIDE 0.9 % IV SOLN
INTRAVENOUS | Status: DC
  Administered 2011-01-05: 14:00:00 via INTRAVENOUS

## 2011-01-05 MED ORDER — LIDOCAINE HCL (PF) 1 % IJ SOLN
INTRAMUSCULAR | Status: AC
  Filled 2011-01-05: qty 30

## 2011-01-05 MED ORDER — PRAMOXINE HCL 1 % RE OINT
1.0000 "application " | TOPICAL_OINTMENT | Freq: Three times a day (TID) | RECTAL | Status: DC | PRN
  Filled 2011-01-05: qty 30

## 2011-01-05 MED ORDER — DIAZEPAM 5 MG PO TABS
5.0000 mg | ORAL_TABLET | ORAL | Status: AC
  Administered 2011-01-05: 5 mg via ORAL
  Filled 2011-01-05: qty 1

## 2011-01-05 MED ORDER — INFLUENZA VIRUS VACC SPLIT PF IM SUSP: 0.5000 mL | INTRAMUSCULAR | Status: DC

## 2011-01-05 MED ORDER — INSULIN ASPART 100 UNIT/ML ~~LOC~~ SOLN
0.0000 [IU] | Freq: Three times a day (TID) | SUBCUTANEOUS | Status: DC
  Administered 2011-01-06: 5 [IU] via SUBCUTANEOUS
  Administered 2011-01-06: 11 [IU] via SUBCUTANEOUS
  Administered 2011-01-06: 3 [IU] via SUBCUTANEOUS
  Administered 2011-01-07 – 2011-01-08 (×3): 2 [IU] via SUBCUTANEOUS
  Administered 2011-01-08: 3 [IU] via SUBCUTANEOUS
  Administered 2011-01-10 (×2): 5 [IU] via SUBCUTANEOUS
  Administered 2011-01-10 – 2011-01-11 (×2): 3 [IU] via SUBCUTANEOUS
  Filled 2011-01-05 (×3): qty 3

## 2011-01-05 MED ORDER — INSULIN GLARGINE 100 UNIT/ML ~~LOC~~ SOLN
100.0000 [IU] | Freq: Two times a day (BID) | SUBCUTANEOUS | Status: DC
  Administered 2011-01-06 (×2): 100 [IU] via SUBCUTANEOUS
  Administered 2011-01-06: 80 [IU] via SUBCUTANEOUS
  Filled 2011-01-05 (×3): qty 3

## 2011-01-05 MED ORDER — INSULIN LISPRO 100 UNIT/ML ~~LOC~~ SOLN
84.0000 [IU] | Freq: Three times a day (TID) | SUBCUTANEOUS | Status: DC
  Filled 2011-01-05: qty 3

## 2011-01-05 MED ORDER — ONDANSETRON HCL 4 MG/2ML IJ SOLN
4.0000 mg | Freq: Four times a day (QID) | INTRAMUSCULAR | Status: DC | PRN
  Filled 2011-01-05: qty 2

## 2011-01-05 MED ORDER — ONDANSETRON HCL 4 MG/2ML IJ SOLN: 4.0000 mg | Freq: Four times a day (QID) | INTRAMUSCULAR | Status: DC | PRN

## 2011-01-05 MED ORDER — METFORMIN HCL 500 MG PO TABS
500.0000 mg | ORAL_TABLET | Freq: Two times a day (BID) | ORAL | Status: DC
  Administered 2011-01-08 – 2011-01-10 (×6): 500 mg via ORAL
  Filled 2011-01-05 (×9): qty 1

## 2011-01-05 MED ORDER — MIDAZOLAM HCL 2 MG/2ML IJ SOLN
INTRAMUSCULAR | Status: AC
  Filled 2011-01-05: qty 2

## 2011-01-05 MED ORDER — PAROXETINE HCL 20 MG PO TABS
20.0000 mg | ORAL_TABLET | ORAL | Status: DC
  Administered 2011-01-06 – 2011-01-11 (×6): 20 mg via ORAL
  Filled 2011-01-05 (×8): qty 1

## 2011-01-05 MED ORDER — MAGNESIUM HYDROXIDE 400 MG/5ML PO SUSP
30.0000 mL | Freq: Every day | ORAL | Status: DC | PRN
  Filled 2011-01-05: qty 30

## 2011-01-05 MED ORDER — LOPERAMIDE HCL 2 MG PO CAPS
4.0000 mg | ORAL_CAPSULE | Freq: Once | ORAL | Status: AC | PRN
  Filled 2011-01-05: qty 2

## 2011-01-05 MED ORDER — SODIUM CHLORIDE 0.9 % IV SOLN: 1.0000 mL/kg/h | INTRAVENOUS | Status: AC

## 2011-01-05 MED ORDER — LOPERAMIDE HCL 2 MG PO CAPS
2.0000 mg | ORAL_CAPSULE | ORAL | Status: DC | PRN
  Filled 2011-01-05: qty 1

## 2011-01-05 MED ORDER — GABAPENTIN 300 MG PO CAPS
300.0000 mg | ORAL_CAPSULE | Freq: Three times a day (TID) | ORAL | Status: DC
  Administered 2011-01-06 – 2011-01-13 (×22): 300 mg via ORAL
  Filled 2011-01-05 (×34): qty 1

## 2011-01-05 NOTE — Progress Notes (Signed)
ANTICOAGULATION CONSULT NOTE - Initial Consult  Pharmacy Consult for  Heparin Indication: CAD - awaiting CABG  Allergies  Allergen Reactions  . Clopidogrel Bisulfate Hives  . Codeine Nausea And Vomiting  . Morphine Nausea And Vomiting    Patient Measurements: Height: 5\' 5"  (165.1 cm) Weight: 188 lb (85.276 kg) IBW/kg (Calculated) : 57  Adjusted Body Weight: 75.5 kg  Vital Signs: Temp: 97.3 F (36.3 C) (11/09 1209) Temp src: Oral (11/09 1209) BP: 166/75 mmHg (11/09 1209) Pulse Rate: 66  (11/09 1504)  Labs:  Basename 01/04/11 1009  HGB 13.0  HCT 39.3  PLT 128*  APTT --  LABPROT 13.5  INR 0.99  HEPARINUNFRC --  CREATININE 0.84  CKTOTAL --  CKMB --  TROPONINI --   Estimated Creatinine Clearance: 75.8 ml/min (by C-G formula based on Cr of 0.84).  Medical History: Past Medical History  Diagnosis Date  . Coronary atherosclerosis of native coronary artery     BMS circ 2001, DES LAD 2005, DES LAD and RCA 2008  . Drug allergy     Allergy to Plavix (hives) - took Ticlid in past  . Essential hypertension, benign   . Mixed hyperlipidemia   . Type 2 diabetes mellitus   . Breast cancer     Chemo and mastectomy    Medications:  Prescriptions prior to admission  Medication Sig Dispense Refill  . aspirin 325 MG tablet Take 325 mg by mouth daily.        Marland Kitchen gabapentin (NEURONTIN) 300 MG capsule Take 300 mg by mouth 3 (three) times daily.        . insulin glargine (LANTUS) 100 UNIT/ML injection Inject 100 Units into the skin 2 (two) times daily.       . insulin lispro (HUMALOG) 100 UNIT/ML injection Inject 84 Units into the skin 3 (three) times daily before meals.       Marland Kitchen lisinopril (PRINIVIL,ZESTRIL) 10 MG tablet Take 10 mg by mouth daily.        . metFORMIN (GLUCOPHAGE) 500 MG tablet Take 500 mg by mouth 2 (two) times daily with a meal.        . metoprolol (LOPRESSOR) 50 MG tablet Take 50 mg by mouth 2 (two) times daily.        Marland Kitchen PARoxetine (PAXIL) 20 MG tablet Take 20  mg by mouth every morning.        . simvastatin (ZOCOR) 20 MG tablet Take 20 mg by mouth at bedtime.        Marland Kitchen DISCONTD: diclofenac (VOLTAREN) 75 MG EC tablet Take 75 mg by mouth 2 (two) times daily.        Marland Kitchen DISCONTD: Nutritional Supplements (VITAL HN PO) Take 1 tablet by mouth daily. VITAL (OTC) multivitamin supplement       . DISCONTD: OVER THE COUNTER MEDICATION Take 1 tablet by mouth daily. Fat Fighters with Carb Inhibitor (OTC) Dietary Supplement       . DISCONTD: OVER THE COUNTER MEDICATION Take 1 tablet by mouth daily. Greens Dietary Supplement (OTC)       . DISCONTD: Ticagrelor (BRILINTA) 90 MG TABS tablet Take 90 mg by mouth 2 (two) times daily.          Assessment: 60 yo female, s/p cath and found to have multi-vessel disease with plans for TCTS consult and to start IV heparin.  She has some baseline thrombocytopenia with platelets at 128K.  We will monitor closely for potential HIT.  Goal of Therapy:  Heparin level  0.3-0.7 units/ml   Plan:  Will start IV heparin at infusion rate 1000 units/hr 8 hours post sheath removal. Check 8 hour level and adjust as needed to maintain goal range. Monitor platelet function daily  [name] 01/05/2011,5:46 PM

## 2011-01-05 NOTE — Procedures (Signed)
Cardiac Catheterization Procedure Note  Name: [name] MRN: [id number] DOB: 17-Sep-1950  Procedure: Left Heart Cath, Selective Coronary Angiography, LV angiography  Indication:  CAD, chest pain (Unstable angina).  The patient had previous stenting to the LAD, Circ and RCA  Procedural details: The right groin was prepped, draped, and anesthetized with 1% lidocaine. Using modified Seldinger technique, a 5 French sheath was introduced into the right femoral artery. Standard Judkins catheters were used for coronary angiography and left ventriculography. Catheter exchanges were performed over a guidewire. There were no immediate procedural complications. The patient was transferred to the post catheterization recovery area for further monitoring.  Procedural Findings:  Hemodynamics:     AO 169/67    LV 167/8``   Coronary angiography:  Coronary dominance: Right  Left mainstem:   Distal 25%   Left anterior descending (LAD):   Ostial long 95%, prox/mid stent patient with mid long 25%, apical mild diffuse disease.  Vessel wraps the apex.  Mid diag moderate sized with mid 95%  Left circumflex (LCx):  AV groove proximal stent widely patent.  OM1 moderated sized with mid 50 - 60%, OM2 large with widely patent proximal stent.  Small posterolateral free of high grade disease.  Right coronary artery (RCA):  Large dominant.  Focal mid 80% stenosis (appears to between two non overlapping stents.  Prox stent with mid 25% restenosis, mid/distal stent with diffuse 25% restenosis.  Large AM (PDA) with luminal irregularities.  PL x 2 small  Left ventriculography: Left ventricular systolic function is normal, LVEF is estimated at 55-65%, there is no significant mitral regurgitation   Final Conclusions:  Severe residual 2 vessel CAD with previous stenting to all 3 vessels.  Preserved EF  Recommendations: Consult for CABG.    Rollene Rotunda 01/05/2011, 4:11 PM

## 2011-01-05 NOTE — H&P (Signed)
Date of Initial H&P: 01/04/11  History reviewed, patient examined, no change in status, stable for surgery. Coronary atherosclerosis of native coronary artery  BMS circ 2001, DES LAD 2005, DES LAD and RCA 2008

## 2011-01-06 ENCOUNTER — Other Ambulatory Visit: Payer: Self-pay

## 2011-01-06 DIAGNOSIS — I2510 Atherosclerotic heart disease of native coronary artery without angina pectoris: Secondary | ICD-10-CM

## 2011-01-06 DIAGNOSIS — I200 Unstable angina: Secondary | ICD-10-CM

## 2011-01-06 DIAGNOSIS — D696 Thrombocytopenia, unspecified: Secondary | ICD-10-CM | POA: Diagnosis not present

## 2011-01-06 LAB — HEPARIN LEVEL (UNFRACTIONATED)
Heparin Unfractionated: <0.1 IU/mL — ABNORMAL LOW (ref 0.30–0.70)
Heparin Unfractionated: 0.16 IU/mL — ABNORMAL LOW (ref 0.30–0.70)
Heparin Unfractionated: 0.43 IU/mL (ref 0.30–0.70)

## 2011-01-06 LAB — CBC
HCT: 34.7 % — ABNORMAL LOW (ref 36.0–46.0)
Hemoglobin: 11.7 g/dL — ABNORMAL LOW (ref 12.0–15.0)
RBC: 3.83 MIL/uL — ABNORMAL LOW (ref 3.87–5.11)
WBC: 3.6 10*3/uL — ABNORMAL LOW (ref 4.0–10.5)

## 2011-01-06 LAB — SURGICAL PCR SCREEN
MRSA, PCR: NEGATIVE
Staphylococcus aureus: NEGATIVE

## 2011-01-06 LAB — BASIC METABOLIC PANEL
Chloride: 104 mEq/L (ref 96–112)
GFR calc Af Amer: >90 mL/min (ref >90)
GFR calc non Af Amer: >90 mL/min (ref >90)
Potassium: 4.1 mEq/L (ref 3.5–5.1)
Sodium: 140 mEq/L (ref 135–145)

## 2011-01-06 LAB — URINALYSIS, ROUTINE W REFLEX MICROSCOPIC
Bilirubin Urine: NEGATIVE
Glucose, UA: NEGATIVE mg/dL
Ketones, ur: NEGATIVE mg/dL
Leukocytes, UA: NEGATIVE
Nitrite: NEGATIVE
Protein, ur: NEGATIVE mg/dL
Specific Gravity, Urine: 1.008 (ref 1.005–1.030)
Urobilinogen, UA: 0.2 mg/dL (ref 0.0–1.0)
pH: 5.5 (ref 5.0–8.0)

## 2011-01-06 LAB — URINE MICROSCOPIC-ADD ON

## 2011-01-06 LAB — LIPID PANEL
Cholesterol: 146 mg/dL (ref 0–200)
LDL Cholesterol: 78 mg/dL (ref 0–99)
Total CHOL/HDL Ratio: 4.2 RATIO
VLDL: 33 mg/dL (ref 0–40)

## 2011-01-06 LAB — GLUCOSE, CAPILLARY

## 2011-01-06 LAB — PLATELET INHIBITION P2Y12: Platelet Function  P2Y12: 300 [PRU] (ref 194–418)

## 2011-01-06 LAB — TSH: TSH: 0.292 u[IU]/mL — ABNORMAL LOW (ref 0.350–4.500)

## 2011-01-06 MED ORDER — HEPARIN (PORCINE) IN NACL 100-0.45 UNIT/ML-% IJ SOLN
1600.0000 [IU]/h | INTRAMUSCULAR | Status: DC
  Administered 2011-01-06 – 2011-01-11 (×7): 1600 [IU]/h via INTRAVENOUS
  Filled 2011-01-06 (×11): qty 250

## 2011-01-06 MED ORDER — INSULIN ASPART 100 UNIT/ML ~~LOC~~ SOLN
84.0000 [IU] | Freq: Three times a day (TID) | SUBCUTANEOUS | Status: DC
  Administered 2011-01-06 – 2011-01-10 (×11): 84 [IU] via SUBCUTANEOUS
  Filled 2011-01-06 (×9): qty 3

## 2011-01-06 NOTE — Consults (Signed)
[address]            [name] [address]          [phone]      REASON FOR CONSULTATION--severe three-vessel coronary artery disease, unstable angina  CONSULTANT Kathlee Nations trigt M.D.  CLINICAL NOTE--I was asked to evaluate this 60 year old Caucasian female diabetic nonsmoker for possible multivessel coronary artery bypass grafting. The patient has a history of CAD dating back over 10 years. She has a known allergy to Plavix with hives. She was admitted at this time for unstable angina and underwent cardiac catheterization. This demonstrates severe three-vessel disease with in-stent stenoses and diabetic pattern of disease with graftable but suboptimal targets. LV function is fairly well preserved and LV and diastolic pressure is 8 mm mercury. She was loaded with tricalgelor 90 mg for 3 doses before her cardiac catheter yesterday. She is now stable on IV heparin and try The lower has been stopped. A 2-D echo is pending but there is no history of cardiac valve disease. There's no history of atrial fibrillation. She remains very active and is recently retired is being a Scientist, research (medical).  PAST MEDICAL HISTORY  1 diabetes mellitus followed by an endocrinologist in [place], A-1 C. approximately 10-12. 2 status post right mastectomy for adenocarcinoma the breast 1984 no radiation to the chest no evidence of recurrence 3 osteoarthritis, diffuse 4 allergic to Plavix 5 hypertension 6 dyslipidemia   MEDICATIONS--Lantus insulin 100 units twice a day, Lopressor 50 mg twice a day, Humalog meal coverage 3 units 3 times a day, Paxil 20 mg daily, Zocor 20 mg daily,) 75 mg daily, aspirin 325 mg daily, Neurontin 300 mg 3 times a day, metformin 500 mg 2 tablets twice a day   SOCIAL HISTORY--the patient stopped smoking 6 years ago. She is very active in her garden and roundhouse. She does not use alcohol. She was were the husband. She is a good friend of  Trish , the  cardmaster for [employer] cardiology  FAMILY HISTORY--positive for diabetes. Her mother had heart bypass surgery in [place]   ROS  General no weight loss or fever HEENT no visual disturbance, upper dental plates, no difficulty swallowing Thorax no history of thoracic trauma status post right mastectomy no radiation to the chest Heart CAD no murmur or arrhythmia current unstable angina G. I. status post cholecystectomy no blood per rectum hepatitis or jaundice Endocrine positive diabetes poorly controlled negative thyroid disease Extremities positive arthritis negative varicose veins negative venous stasis ulcer negative diabetic ulcer Vascular no TIA or claudication Dopplers are pending. Neuro no stroke seizure or syncope    Physical Exam  vital signs per medical record  General Appearance  middle-aged Caucasian female wasn't alert in no acute distress HEENT normocephalic pupils equal dentition good Chest breath sounds clear no deformity  well-healed right mastectomy Heart regular rate rhythm no S3 gallop or murmur Abdomen soft nontender without pulsatile mass or bruit Extremities no clubbing cyanosis or edema palpable radial pulses palpable femoral pulses nonpalpable pedal pulses Neuro alert and oriented no focal motor deficit Skin no rash or lesion  Impression middle-aged female with severe diabetic type coronary disease following multiple previous stents with good LV function. She has unstable angina. Her targets are suboptimal but doable and due to her young age and good LV function I agree that a high risk surgical evacuation is indicated.  Will need  to wait several days for the try Calor washout and will follow P2 Y. 12 assay of platelet function. Surgery tentatively scheduled for later next week.

## 2011-01-06 NOTE — Progress Notes (Signed)
SUBJECTIVE:  No chest pain.  No SOB.     PHYSICAL EXAM Filed Vitals:   01/05/11 1504 01/05/11 1830 01/05/11 2100 01/06/11 0500  BP:  146/49 144/76 124/71  Pulse: 66 62 64 59  Temp:   98.1 F (36.7 C) 97.7 F (36.5 C)  TempSrc:   Oral Oral  Resp:   18 16  Height:      Weight:      SpO2:   97% 95%   General:  No distress Lungs:  clear Heart:  RRR, no murmur Abdomen:  .obese Extremities:  No edema.  Right groin OK  LABS:  Results for orders placed during the hospital encounter of 01/05/11 (from the past 24 hour(s))  GLUCOSE, CAPILLARY     Status: Abnormal   Collection Time   01/05/11 12:14 PM      Component Value Range   Glucose-Capillary 201 (*) 70 - 99 (mg/dL)  GLUCOSE, CAPILLARY     Status: Abnormal   Collection Time   01/05/11  4:18 PM      Component Value Range   Glucose-Capillary 137 (*) 70 - 99 (mg/dL)  GLUCOSE, CAPILLARY     Status: Abnormal   Collection Time   01/05/11  5:30 PM      Component Value Range   Glucose-Capillary 132 (*) 70 - 99 (mg/dL)   Comment 1 Documented in Chart     Comment 2 Notify RN    GLUCOSE, CAPILLARY     Status: Abnormal   Collection Time   01/05/11  9:45 PM      Component Value Range   Glucose-Capillary 234 (*) 70 - 99 (mg/dL)   Comment 1 Documented in Chart     Comment 2 Notify RN    BASIC METABOLIC PANEL     Status: Abnormal   Collection Time   01/06/11  6:00 AM      Component Value Range   Sodium 140  135 - 145 (mEq/L)   Potassium 4.1  3.5 - 5.1 (mEq/L)   Chloride 104  96 - 112 (mEq/L)   CO2 27  19 - 32 (mEq/L)   Glucose, Bld 191 (*) 70 - 99 (mg/dL)   BUN 15  6 - 23 (mg/dL)   Creatinine, Ser 1.61  0.50 - 1.10 (mg/dL)   Calcium 8.7  8.4 - 09.6 (mg/dL)   GFR calc non Af Amer >90  >90 (mL/min)   GFR calc Af Amer >90  >90 (mL/min)  CBC     Status: Abnormal   Collection Time   01/06/11  6:00 AM      Component Value Range   WBC 3.6 (*) 4.0 - 10.5 (K/uL)   RBC 3.83 (*) 3.87 - 5.11 (MIL/uL)   Hemoglobin 11.7 (*) 12.0 - 15.0  (g/dL)   HCT 04.5 (*) 40.9 - 46.0 (%)   MCV 90.6  78.0 - 100.0 (fL)   MCH 30.5  26.0 - 34.0 (pg)   MCHC 33.7  30.0 - 36.0 (g/dL)   RDW 81.1  91.4 - 78.2 (%)   Platelets 113 (*) 150 - 400 (K/uL)  TSH     Status: Abnormal   Collection Time   01/06/11  6:00 AM      Component Value Range   TSH 0.292 (*) 0.350 - 4.500 (uIU/mL)  HEPARIN LEVEL     Status: Abnormal   Collection Time   01/06/11  6:00 AM      Component Value Range   Heparin Unfractionated <0.10 (*) 0.30 -  0.70 (IU/mL)  LIPID PANEL     Status: Abnormal   Collection Time   01/06/11  6:00 AM      Component Value Range   Cholesterol 146  0 - 200 (mg/dL)   Triglycerides 295 (*) <150 (mg/dL)   HDL 35 (*) >62 (mg/dL)   Total CHOL/HDL Ratio 4.2     VLDL 33  0 - 40 (mg/dL)   LDL Cholesterol 78  0 - 99 (mg/dL)  GLUCOSE, CAPILLARY     Status: Abnormal   Collection Time   01/06/11  6:38 AM      Component Value Range   Glucose-Capillary 183 (*) 70 - 99 (mg/dL)    Intake/Output Summary (Last 24 hours) at 01/06/11 1130 Last data filed at 01/05/11 2100  Gross per 24 hour  Intake    480 ml  Output      0 ml  Net    480 ml    ASSESSMENT AND PLAN:  Principal Problem:  *Unstable angina Active Problems:  Type II or unspecified type diabetes mellitus without mention of complication, uncontrolled  HYPERLIPIDEMIA-MIXED  Essential hypertension, benign  Coronary atherosclerosis of native coronary artery  Abnormal TSH  Thrombocytopenia   CAD:  For CABG.  Consult pending.  Continue current therapy.  Brillinta discontinued yesterday.  THS:  Low.  Check T3, T4  Diabetes:  Continue current meds  Thrombocytopenia:  Repeat in am  Hyperlipidemia:  Continue current therapy.  [name] 01/06/2011 11:30 AM

## 2011-01-06 NOTE — Progress Notes (Signed)
ANTICOAGULATION CONSULT NOTE - Follow Up Consult  Pharmacy Consult for heparin Indication: chest pain/ACS - awaiting CABG  Allergies  Allergen Reactions  . Clopidogrel Bisulfate Hives  . Codeine Nausea And Vomiting  . Morphine Nausea And Vomiting    Patient Measurements: Height: 5\' 5"  (165.1 cm) Weight: 188 lb (85.276 kg) IBW/kg (Calculated) : 57   Vital Signs: Temp: 98.3 F (36.8 C) (11/10 1300) Temp src: Oral (11/10 1300) BP: 147/71 mmHg (11/10 1300) Pulse Rate: 59  (11/10 0500)  Labs:  Basename 01/06/11 1400 01/06/11 0600 01/04/11 1009  HGB -- 11.7* 13.0  HCT -- 34.7* 39.3  PLT -- 113* 128*  APTT -- -- --  LABPROT -- -- 13.5  INR -- -- 0.99  HEPARINUNFRC 0.16* <0.10* --  CREATININE -- 0.63 0.84  CKTOTAL -- -- --  CKMB -- -- --  TROPONINI -- -- --   Estimated Creatinine Clearance: 79.6 ml/min (by C-G formula based on Cr of 0.63).  Assessment: Heparin level below goal of 0.3 - 0.7   Plan:  Increase heparin to 1600 units/hr.  Check heparin level in 6 hours and adjust per goal  Mickeal Skinner 01/06/2011,3:14 PM

## 2011-01-06 NOTE — Progress Notes (Signed)
ANTICOAGULATION CONSULT NOTE - Follow Up Consult  Pharmacy Consult for heparin Indication: ACS (awaiting CABG)  Allergies  Allergen Reactions  . Clopidogrel Bisulfate Hives  . Codeine Nausea And Vomiting  . Morphine Nausea And Vomiting    Patient Measurements: Height: 5\' 5"  (165.1 cm) Weight: 188 lb (85.276 kg) IBW/kg (Calculated) : 57  Adjusted Body Weight: 75.5  Vital Signs: Temp: 97.7 F (36.5 C) (11/10 0500) Temp src: Oral (11/10 0500) BP: 124/71 mmHg (11/10 0500) Pulse Rate: 59  (11/10 0500)  Labs:  Basename 01/06/11 0600 01/04/11 1009  HGB 11.7* 13.0  HCT 34.7* 39.3  PLT 113* 128*  APTT -- --  LABPROT -- 13.5  INR -- 0.99  HEPARINUNFRC <0.10* --  CREATININE 0.63 0.84  CKTOTAL -- --  CKMB -- --  TROPONINI -- --   Estimated Creatinine Clearance: 79.6 ml/min (by C-G formula based on Cr of 0.63).   Medications:  Scheduled:    . aspirin  324 mg Oral Pre-Cath  . aspirin  325 mg Oral Daily  . diazepam  5 mg Oral On Call  . gabapentin  300 mg Oral TID  . heparin      . insulin aspart  0-15 Units Subcutaneous TID WC  . insulin glargine  100 Units Subcutaneous BID  . insulin lispro  84 Units Subcutaneous TID AC  . lisinopril  10 mg Oral Daily  . metFORMIN  500 mg Oral BID WC  . metoprolol  50 mg Oral BID  . midazolam      . midazolam      . nitroGLYCERIN      . PARoxetine  20 mg Oral QAM  . simvastatin  20 mg Oral QHS  . DISCONTD: influenza  inactive virus vaccine  0.5 mL Intramuscular Tomorrow-1000  . DISCONTD: Ticagrelor  90 mg Oral BID    Assessment: 60yo female undetectable on heparin with initial dosing for ACS while awaiting TCTS consult for CABG.  Goal of Therapy:  Heparin level 0.3-0.7 units/ml   Plan:  Will increase gtt by 4 units/kg/hr to 1300 units/hr and check level in 6h.  Colleen Can PharmD BCPS 01/06/2011,7:33 AM

## 2011-01-06 NOTE — Progress Notes (Signed)
Pt. Ambulated from bed to door without any problems, or complaints. Dressing dry and intact. No complaints of shortness of breath or pain.

## 2011-01-06 NOTE — Progress Notes (Signed)
ANTICOAGULATION CONSULT NOTE - Follow Up Consult  Pharmacy Consult for heparin Indication: chest pain/ACS - awaiting CABG  Allergies  Allergen Reactions  . Clopidogrel Bisulfate Hives  . Codeine Nausea And Vomiting  . Morphine Nausea And Vomiting    Patient Measurements: Height: 5\' 5"  (165.1 cm) Weight: 188 lb (85.276 kg) IBW/kg (Calculated) : 57   Vital Signs: Temp: 98.2 F (36.8 C) (11/10 2028) Temp src: Oral (11/10 2028) BP: 135/60 mmHg (11/10 2028) Pulse Rate: 65  (11/10 2028)  Labs:  Basename 01/06/11 2114 01/06/11 1400 01/06/11 0600 01/04/11 1009  HGB -- -- 11.7* 13.0  HCT -- -- 34.7* 39.3  PLT -- -- 113* 128*  APTT -- -- -- --  LABPROT -- -- -- 13.5  INR -- -- -- 0.99  HEPARINUNFRC 0.43 0.16* <0.10* --  CREATININE -- -- 0.63 0.84  CKTOTAL -- -- -- --  CKMB -- -- -- --  TROPONINI -- -- -- --   Estimated Creatinine Clearance: 79.6 ml/min (by C-G formula based on Cr of 0.63).  Assessment: 60yo female awaiting CABG now therapeutic on heparin.   Plan:  Will continue heparin at current rate and confirm stable w/ am labs.  Colleen Can 01/06/2011,10:21 PM

## 2011-01-07 LAB — CBC
HCT: 34.7 % — ABNORMAL LOW (ref 36.0–46.0)
Hemoglobin: 11.8 g/dL — ABNORMAL LOW (ref 12.0–15.0)
MCHC: 34 g/dL (ref 30.0–36.0)
MCV: 89.4 fL (ref 78.0–100.0)
RDW: 12.7 % (ref 11.5–15.5)

## 2011-01-07 LAB — BLOOD GAS, ARTERIAL
Acid-base deficit: 0 mmol/L (ref 0.0–2.0)
Bicarbonate: 24.2 mEq/L — ABNORMAL HIGH (ref 20.0–24.0)
Drawn by: 277331
FIO2: 0.21 %
O2 Saturation: 96.9 %
Patient temperature: 98.6
TCO2: 25.5 mmol/L (ref 0–100)
pCO2 arterial: 40.5 mmHg (ref 35.0–45.0)
pH, Arterial: 7.395 (ref 7.350–7.400)
pO2, Arterial: 87.6 mmHg (ref 80.0–100.0)

## 2011-01-07 LAB — COMPREHENSIVE METABOLIC PANEL
ALT: 46 U/L — ABNORMAL HIGH (ref 0–35)
AST: 41 U/L — ABNORMAL HIGH (ref 0–37)
Albumin: 3.2 g/dL — ABNORMAL LOW (ref 3.5–5.2)
Alkaline Phosphatase: 63 U/L (ref 39–117)
BUN: 18 mg/dL (ref 6–23)
CO2: 27 mEq/L (ref 19–32)
Calcium: 8.8 mg/dL (ref 8.4–10.5)
Chloride: 102 mEq/L (ref 96–112)
Creatinine, Ser: 0.73 mg/dL (ref 0.50–1.10)
GFR calc Af Amer: >90 mL/min (ref >90)
GFR calc non Af Amer: >90 mL/min (ref >90)
Glucose, Bld: 170 mg/dL — ABNORMAL HIGH (ref 70–99)
Potassium: 3.9 mEq/L (ref 3.5–5.1)
Sodium: 138 mEq/L (ref 135–145)
Total Bilirubin: 0.3 mg/dL (ref 0.3–1.2)
Total Protein: 6.8 g/dL (ref 6.0–8.3)

## 2011-01-07 LAB — HEMOGLOBIN A1C
Hgb A1c MFr Bld: 8.2 % — ABNORMAL HIGH (ref <5.7)
Mean Plasma Glucose: 189 mg/dL — ABNORMAL HIGH (ref <117)

## 2011-01-07 LAB — GLUCOSE, CAPILLARY
Glucose-Capillary: 145 mg/dL — ABNORMAL HIGH (ref 70–99)
Glucose-Capillary: 124 mg/dL — ABNORMAL HIGH (ref 70–99)

## 2011-01-07 LAB — T3: T3, Total: 110.9 ng/dl (ref 80.0–204.0)

## 2011-01-07 MED ORDER — INSULIN GLARGINE 100 UNIT/ML ~~LOC~~ SOLN
50.0000 [IU] | Freq: Two times a day (BID) | SUBCUTANEOUS | Status: DC
  Administered 2011-01-07 – 2011-01-10 (×7): 50 [IU] via SUBCUTANEOUS
  Filled 2011-01-07: qty 3

## 2011-01-07 NOTE — Progress Notes (Signed)
ANTICOAGULATION CONSULT NOTE - Follow Up Consult  Pharmacy Consult for heparin Indication: chest pain/ACS - awaiting CABG  Allergies  Allergen Reactions  . Clopidogrel Bisulfate Hives  . Codeine Nausea And Vomiting  . Morphine Nausea And Vomiting    Patient Measurements: Height: 5\' 5"  (165.1 cm) Weight: 188 lb (85.276 kg) IBW/kg (Calculated) : 57   Vital Signs: Temp: 98 F (36.7 C) (11/11 0555) Temp src: Oral (11/11 0555) BP: 129/81 mmHg (11/11 0555) Pulse Rate: 59  (11/11 0555)  Labs:  Basename 01/07/11 0548 01/06/11 2114 01/06/11 1400 01/06/11 0600  HGB 11.8* -- -- 11.7*  HCT 34.7* -- -- 34.7*  PLT 115* -- -- 113*  APTT -- -- -- --  LABPROT -- -- -- --  INR -- -- -- --  HEPARINUNFRC 0.56 0.43 0.16* --  CREATININE 0.73 -- -- 0.63  CKTOTAL -- -- -- --  CKMB -- -- -- --  TROPONINI -- -- -- --   Estimated Creatinine Clearance: 79.6 ml/min (by C-G formula based on Cr of 0.73).  Assessment: 60yo female awaiting CABG now therapeutic on heparin.   Plan:  Will continue heparin at current rate.  Heparin level and CBC in AM.  [name] 01/07/2011,11:43 AM

## 2011-01-07 NOTE — Progress Notes (Signed)
2 Days Post-Op Procedure(s) (LRB): LEFT HEART CATHETERIZATION WITH CORONARY ANGIOGRAM (N/A)                                                                        [address]            [name] [address]          [phone]                                                               SUBJECTIVE  No angina on IV heparin, day#2 ticagrelor washout No clinical bleeding,plts stable    Objective: Vital signs in last 24 hours: Temp:  [98 F (36.7 C)-98.4 F (36.9 C)] 98.4 F (36.9 C) (11/11 1332) Pulse Rate:  [59-65] 62  (11/11 1332) Cardiac Rhythm:  [-] Normal sinus rhythm (11/11 0809) Resp:  [18] 18  (11/11 1332) BP: (129-136)/(60-81) 136/67 mmHg (11/11 1332) SpO2:  [96 %-97 %] 97 % (11/11 1332)  Hemodynamic parameters for last 24 hours:  stable  Intake/Output from previous day: 11/10 0701 - 11/11 0700 In: 1451.2 [P.O.:1200; I.V.:251.2] Out: -  Intake/Output this shift: Total I/O In: 1568 [P.O.:1440; I.V.:128] Out: -   NSR no murmur or gallop  Lab Results: MRSA screen neg  A1C elevated  Basename 01/07/11 0548 01/06/11 0600  WBC 4.3 3.6*  HGB 11.8* 11.7*  HCT 34.7* 34.7*  PLT 115* 113*   BMET:  Basename 01/07/11 0548 01/06/11 0600  NA 138 140  K 3.9 4.1  CL 102 104  CO2 27 27  GLUCOSE 170* 191*  BUN 18 15  CREATININE 0.73 0.63  CALCIUM 8.8 8.7    PT/INR: No results found for this basename: LABPROT,INR in the last 72 hours ABG    Component Value Date/Time   PHART 7.395 01/07/2011 1032   HCO3 24.2* 01/07/2011 1032   TCO2 25.5 01/07/2011 1032   ACIDBASEDEF 0.0 01/07/2011 1032   O2SAT 96.9 01/07/2011 1032   CBG (last 3)   Basename 01/07/11 1201 01/07/11 0550 01/06/11 2029  GLUCAP 124* 145* 99    Assessment/Plan: S/P Procedure(s) (LRB): LEFT HEART CATHETERIZATION WITH CORONARY ANGIOGRAM (N/A) High risk CABG later this week improve DM control pre-op  LOS: 2 days    [name],PETER 01/07/2011

## 2011-01-07 NOTE — Progress Notes (Signed)
SUBJECTIVE:  Denies chest pain   PHYSICAL EXAM Filed Vitals:   01/06/11 0500 01/06/11 1300 01/06/11 2028 01/07/11 0555  BP: 124/71 147/71 135/60 129/81  Pulse: 59  65 59  Temp: 97.7 F (36.5 C) 98.3 F (36.8 C) 98.2 F (36.8 C) 98 F (36.7 C)  TempSrc: Oral Oral Oral Oral  Resp: 16 18 18 18   Height:      Weight:      SpO2: 95% 95% 97% 96%   General:  No distress Lungs:  clear Heart:  RRR, no murmurs Abdomen:  Positive bowel sounds, no rebound no guarding Extremities:  No edema  LABS:  Results for orders placed during the hospital encounter of 01/05/11 (from the past 24 hour(s))  GLUCOSE, CAPILLARY     Status: Abnormal   Collection Time   01/06/11 11:49 AM      Component Value Range   Glucose-Capillary 307 (*) 70 - 99 (mg/dL)   Comment 1 Documented in Chart     Comment 2 Notify RN    HEPARIN LEVEL     Status: Abnormal   Collection Time   01/06/11  2:00 PM      Component Value Range   Heparin Unfractionated 0.16 (*) 0.30 - 0.70 (IU/mL)  GLUCOSE, CAPILLARY     Status: Abnormal   Collection Time   01/06/11  4:32 PM      Component Value Range   Glucose-Capillary 203 (*) 70 - 99 (mg/dL)   Comment 1 Documented in Chart     Comment 2 Notify RN    SURGICAL PCR SCREEN     Status: Normal   Collection Time   01/06/11  5:46 PM      Component Value Range   MRSA, PCR NEGATIVE  NEGATIVE    Staphylococcus aureus NEGATIVE  NEGATIVE   PLATELET INHIBITION P2Y12     Status: Normal   Collection Time   01/06/11  6:45 PM      Component Value Range   Platelet Function  P2Y12 300  194 - 418 (PRU)  GLUCOSE, CAPILLARY     Status: Normal   Collection Time   01/06/11  8:29 PM      Component Value Range   Glucose-Capillary 99  70 - 99 (mg/dL)  HEPARIN LEVEL     Status: Normal   Collection Time   01/06/11  9:14 PM      Component Value Range   Heparin Unfractionated 0.43  0.30 - 0.70 (IU/mL)  URINALYSIS, ROUTINE W REFLEX MICROSCOPIC     Status: Abnormal   Collection Time   01/06/11 10:22 PM      Component Value Range   Color, Urine STRAW (*) YELLOW    Appearance CLEAR  CLEAR    Specific Gravity, Urine 1.008  1.005 - 1.030    pH 5.5  5.0 - 8.0    Glucose, UA NEGATIVE  NEGATIVE (mg/dL)   Hgb urine dipstick LARGE (*) NEGATIVE    Bilirubin Urine NEGATIVE  NEGATIVE    Ketones, ur NEGATIVE  NEGATIVE (mg/dL)   Protein, ur NEGATIVE  NEGATIVE (mg/dL)   Urobilinogen, UA 0.2  0.0 - 1.0 (mg/dL)   Nitrite NEGATIVE  NEGATIVE    Leukocytes, UA NEGATIVE  NEGATIVE   URINE MICROSCOPIC-ADD ON     Status: Abnormal   Collection Time   01/06/11 10:22 PM      Component Value Range   Squamous Epithelial / LPF RARE  RARE    WBC, UA 0-2  <3 (WBC/hpf)  RBC / HPF 0-2  <3 (RBC/hpf)   Bacteria, UA FEW (*) RARE   HEPARIN LEVEL     Status: Normal   Collection Time   01/07/11  5:48 AM      Component Value Range   Heparin Unfractionated 0.56  0.30 - 0.70 (IU/mL)  CBC     Status: Abnormal   Collection Time   01/07/11  5:48 AM      Component Value Range   WBC 4.3  4.0 - 10.5 (K/uL)   RBC 3.88  3.87 - 5.11 (MIL/uL)   Hemoglobin 11.8 (*) 12.0 - 15.0 (g/dL)   HCT 04.5 (*) 40.9 - 46.0 (%)   MCV 89.4  78.0 - 100.0 (fL)   MCH 30.4  26.0 - 34.0 (pg)   MCHC 34.0  30.0 - 36.0 (g/dL)   RDW 81.1  91.4 - 78.2 (%)   Platelets 115 (*) 150 - 400 (K/uL)  COMPREHENSIVE METABOLIC PANEL     Status: Abnormal   Collection Time   01/07/11  5:48 AM      Component Value Range   Sodium 138  135 - 145 (mEq/L)   Potassium 3.9  3.5 - 5.1 (mEq/L)   Chloride 102  96 - 112 (mEq/L)   CO2 27  19 - 32 (mEq/L)   Glucose, Bld 170 (*) 70 - 99 (mg/dL)   BUN 18  6 - 23 (mg/dL)   Creatinine, Ser 9.56  0.50 - 1.10 (mg/dL)   Calcium 8.8  8.4 - 21.3 (mg/dL)   Total Protein 6.8  6.0 - 8.3 (g/dL)   Albumin 3.2 (*) 3.5 - 5.2 (g/dL)   AST 41 (*) 0 - 37 (U/L)   ALT 46 (*) 0 - 35 (U/L)   Alkaline Phosphatase 63  39 - 117 (U/L)   Total Bilirubin 0.3  0.3 - 1.2 (mg/dL)   GFR calc non Af Amer >90  >90 (mL/min)    GFR calc Af Amer >90  >90 (mL/min)  GLUCOSE, CAPILLARY     Status: Abnormal   Collection Time   01/07/11  5:50 AM      Component Value Range   Glucose-Capillary 145 (*) 70 - 99 (mg/dL)    Intake/Output Summary (Last 24 hours) at 01/07/11 0955 Last data filed at 01/06/11 2100  Gross per 24 hour  Intake 1451.15 ml  Output      0 ml  Net 1451.15 ml    ASSESSMENT AND PLAN:  Principal Problem:  *Unstable angina Active Problems:  Type II or unspecified type diabetes mellitus without mention of complication, uncontrolled  HYPERLIPIDEMIA-MIXED  Essential hypertension, benign  Coronary atherosclerosis of native coronary artery  Abnormal TSH  Thrombocytopenia   CAD:  I appreciate the consult by Dr. Donata Clay.  Patient to have surgery later this week.    HTN:  BP OK.  Continue current meds  Diabetes:  Metformin to restart tomorrow. I will reduce the Lantus as suggested by pharmacy   Rollene Rotunda 01/07/2011 9:55 AM

## 2011-01-07 NOTE — Plan of Care (Signed)
Problem: Phase II Progression Outcomes Goal: Vascular site scale level 0 - I Vascular Site Scale Level 0: No bruising/bleeding/hematoma Level I (Mild): Bruising/Ecchymosis, minimal bleeding/ooozing, palpable hematoma < 3 cm Level II (Moderate): Bleeding not affecting hemodynamic parameters, pseudoaneurysm, palpable hematoma > 3 cm  Outcome: Completed/Met Date Met:  01/07/11 Level 0

## 2011-01-08 ENCOUNTER — Encounter (HOSPITAL_COMMUNITY): Payer: Self-pay

## 2011-01-08 ENCOUNTER — Other Ambulatory Visit (HOSPITAL_COMMUNITY): Payer: Self-pay | Admitting: Respiratory Therapy

## 2011-01-08 ENCOUNTER — Ambulatory Visit (HOSPITAL_COMMUNITY): Payer: Managed Care, Other (non HMO)

## 2011-01-08 ENCOUNTER — Other Ambulatory Visit: Payer: Self-pay

## 2011-01-08 DIAGNOSIS — I517 Cardiomegaly: Secondary | ICD-10-CM

## 2011-01-08 DIAGNOSIS — I2510 Atherosclerotic heart disease of native coronary artery without angina pectoris: Secondary | ICD-10-CM

## 2011-01-08 DIAGNOSIS — Z01810 Encounter for preprocedural cardiovascular examination: Secondary | ICD-10-CM

## 2011-01-08 LAB — CBC
HCT: 37.6 % (ref 36.0–46.0)
Hemoglobin: 12.9 g/dL (ref 12.0–15.0)
MCHC: 34.3 g/dL (ref 30.0–36.0)
MCV: 89.7 fL (ref 78.0–100.0)
RDW: 12.8 % (ref 11.5–15.5)
WBC: 4.8 10*3/uL (ref 4.0–10.5)

## 2011-01-08 LAB — GLUCOSE, CAPILLARY: Glucose-Capillary: 156 mg/dL — ABNORMAL HIGH (ref 70–99)

## 2011-01-08 MED ORDER — ALBUTEROL SULFATE (5 MG/ML) 0.5% IN NEBU
2.5000 mg | INHALATION_SOLUTION | Freq: Once | RESPIRATORY_TRACT | Status: AC
  Administered 2011-01-08: 2.5 mg via RESPIRATORY_TRACT

## 2011-01-08 NOTE — Plan of Care (Signed)
Problem: Phase I Progression Outcomes Goal: Initial discharge plan identified D/c to home with husband

## 2011-01-08 NOTE — Progress Notes (Signed)
*  PRELIMINARY RESULTS*  Pre-CABG has been performed. Preliminary- There is a Right ICA 60 to 79% low end of scale stenosis by velocity. There is a Left ICA 60 to 79% low to mid end of scale stenosis by velocity. Vertebral arteries are patent with antegrade flow. Right ABI is 0.99. Left ABI is 1.01.    Mila Homer 01/08/2011, 2:36 PM

## 2011-01-08 NOTE — Progress Notes (Signed)
SUBJECTIVE:  No chest pain.  No SOB  PHYSICAL EXAM Filed Vitals:   01/07/11 0555 01/07/11 1332 01/07/11 2116 01/08/11 0552  BP: 129/81 136/67 158/69 128/65  Pulse: 59 62 69 59  Temp: 98 F (36.7 C) 98.4 F (36.9 C) 98.1 F (36.7 C) 97.6 F (36.4 C)  TempSrc: Oral Oral Oral Oral  Resp: 18 18 18 18   Height:      Weight:      SpO2: 96% 97% 96% 97%   General:  No distress Lungs:  Clear Heart:  RRR, no murmurs Abdomen:  Positive bowel sounds, no rebound no guarding Extremities:  No edema  LABS:  Results for orders placed during the hospital encounter of 01/05/11 (from the past 24 hour(s))  BLOOD GAS, ARTERIAL     Status: Abnormal   Collection Time   01/07/11 10:32 AM      Component Value Range   FIO2 .21     pH, Arterial 7.395  7.350 - 7.400    pCO2 arterial 40.5  35.0 - 45.0 (mmHg)   pO2, Arterial 87.6  80.0 - 100.0 (mmHg)   Bicarbonate 24.2 (*) 20.0 - 24.0 (mEq/L)   TCO2 25.5  0 - 100 (mmol/L)   Acid-base deficit 0.0  0.0 - 2.0 (mmol/L)   O2 Saturation 96.9     Patient temperature 98.6     Collection site LEFT RADIAL     Drawn by [id number]     Sample type ARTERIAL     Allens test (pass/fail) PASS  PASS   GLUCOSE, CAPILLARY     Status: Abnormal   Collection Time   01/07/11 12:01 PM      Component Value Range   Glucose-Capillary 124 (*) 70 - 99 (mg/dL)  GLUCOSE, CAPILLARY     Status: Normal   Collection Time   01/07/11  4:23 PM      Component Value Range   Glucose-Capillary 95  70 - 99 (mg/dL)   Comment 1 Documented in Chart     Comment 2 Notify RN    GLUCOSE, CAPILLARY     Status: Abnormal   Collection Time   01/07/11  9:15 PM      Component Value Range   Glucose-Capillary 153 (*) 70 - 99 (mg/dL)  GLUCOSE, CAPILLARY     Status: Abnormal   Collection Time   01/08/11  5:36 AM      Component Value Range   Glucose-Capillary 146 (*) 70 - 99 (mg/dL)  HEPARIN LEVEL     Status: Normal   Collection Time   01/08/11  7:15 AM      Component Value Range   Heparin  Unfractionated 0.49  0.30 - 0.70 (IU/mL)    Intake/Output Summary (Last 24 hours) at 01/08/11 0854 Last data filed at 01/07/11 1700  Gross per 24 hour  Intake   2168 ml  Output      0 ml  Net   2168 ml    ASSESSMENT AND PLAN:  Principal Problem:  1) *Unstable angina:  CABG later this week Active Problems:  2)  Type II or unspecified type diabetes mellitus without mention of complication, uncontrolled:  Metformin can restart today.  3)  HYPERLIPIDEMIA-MIXED:  Continue current meds  4)  Essential hypertension, benign:  Continue current meds  5)  Abnormal TSH:  T3/T4 OK  6) Thrombocytopenia:  Check CBC in am.  They have been stable    Rollene Rotunda 01/08/2011 8:54 AM

## 2011-01-08 NOTE — Progress Notes (Addendum)
ANTICOAGULATION CONSULT NOTE - Follow Up Consult  Pharmacy Consult for Heparin   Indication: chest pain/ACS-awaiting CABG  Allergies  Allergen Reactions  . Clopidogrel Bisulfate Hives  . Codeine Nausea And Vomiting  . Morphine Nausea And Vomiting    Patient Measurements: Height: 5\' 5"  (165.1 cm) Weight: 188 lb (85.276 kg) IBW/kg (Calculated) : 57    Vital Signs: Temp: 97.6 F (36.4 C) (11/12 0552) Temp src: Oral (11/12 0552) BP: 128/65 mmHg (11/12 0552) Pulse Rate: 59  (11/12 0552)  Labs:  Basename 01/08/11 0715 01/07/11 0548 01/06/11 2114 01/06/11 0600  HGB -- 11.8* -- 11.7*  HCT -- 34.7* -- 34.7*  PLT -- 115* -- 113*  APTT -- -- -- --  LABPROT -- -- -- --  INR -- -- -- --  HEPARINUNFRC 0.49 0.56 0.43 --  CREATININE -- 0.73 -- 0.63  CKTOTAL -- -- -- --  CKMB -- -- -- --  TROPONINI -- -- -- --   Estimated Creatinine Clearance: 79.6 ml/min (by C-G formula based on Cr of 0.73).   Medications:  Scheduled:    . aspirin  325 mg Oral Daily  . gabapentin  300 mg Oral TID  . insulin aspart  0-15 Units Subcutaneous TID WC  . insulin aspart  84 Units Subcutaneous TID WC  . insulin glargine  50 Units Subcutaneous BID  . lisinopril  10 mg Oral Daily  . metFORMIN  500 mg Oral BID WC  . metoprolol  50 mg Oral BID  . PARoxetine  20 mg Oral QAM  . simvastatin  20 mg Oral QHS  . DISCONTD: insulin glargine  100 Units Subcutaneous BID    Assessment:  63 YOF with severe 2 vessel CAD awaiting CABG later this week (ticagrelor washout).  Heparin level is therapeutic.  CBC was not drawn this am - did not see order in computer.  Platelets have been on 110's.  Appears has h/o chronic thrombocytopenia Goal of Therapy:  Heparin level 0.3-0.7 units/ml   Plan:  1. Continue heparin at current rate as heparin level therapeutic. 2. Due to platelets being on lower side, will check platelets now and daily.  CBC checked and Hgb=12.9, platelets=127  Kamaal Cast, Tad Moore 01/08/2011,8:57 AM

## 2011-01-09 DIAGNOSIS — I6529 Occlusion and stenosis of unspecified carotid artery: Secondary | ICD-10-CM | POA: Diagnosis present

## 2011-01-09 DIAGNOSIS — I2510 Atherosclerotic heart disease of native coronary artery without angina pectoris: Secondary | ICD-10-CM

## 2011-01-09 LAB — CBC
HCT: 34.3 % — ABNORMAL LOW (ref 36.0–46.0)
Hemoglobin: 11.6 g/dL — ABNORMAL LOW (ref 12.0–15.0)
MCH: 30.6 pg (ref 26.0–34.0)
MCV: 90.5 fL (ref 78.0–100.0)
Platelets: 111 10*3/uL — ABNORMAL LOW (ref 150–400)
RBC: 3.79 MIL/uL — ABNORMAL LOW (ref 3.87–5.11)

## 2011-01-09 LAB — GLUCOSE, CAPILLARY
Glucose-Capillary: 91 mg/dL (ref 70–99)
Glucose-Capillary: 91 mg/dL (ref 70–99)
Glucose-Capillary: 159 mg/dL — ABNORMAL HIGH (ref 70–99)

## 2011-01-09 LAB — PREPARE RBC (CROSSMATCH)

## 2011-01-09 LAB — ABO/RH: ABO/RH(D): B POS

## 2011-01-09 MED ORDER — METOPROLOL TARTRATE 12.5 MG HALF TABLET
12.5000 mg | ORAL_TABLET | Freq: Once | ORAL | Status: DC
  Filled 2011-01-09 (×2): qty 1

## 2011-01-09 MED ORDER — ACETAMINOPHEN 325 MG PO TABS
650.0000 mg | ORAL_TABLET | ORAL | Status: DC | PRN
  Administered 2011-01-09: 650 mg via ORAL
  Filled 2011-01-09: qty 2

## 2011-01-09 MED ORDER — CHLORHEXIDINE GLUCONATE 4 % EX LIQD
60.0000 mL | Freq: Once | CUTANEOUS | Status: DC
  Filled 2011-01-09: qty 60

## 2011-01-09 MED ORDER — BISACODYL 5 MG PO TBEC
5.0000 mg | DELAYED_RELEASE_TABLET | Freq: Once | ORAL | Status: DC
  Filled 2011-01-09: qty 2

## 2011-01-09 MED ORDER — ALPRAZOLAM 0.25 MG PO TABS: 0.2500 mg | ORAL_TABLET | ORAL | Status: DC | PRN

## 2011-01-09 MED ORDER — TEMAZEPAM 15 MG PO CAPS: 15.0000 mg | ORAL_CAPSULE | Freq: Once | ORAL | Status: AC | PRN

## 2011-01-09 NOTE — Progress Notes (Signed)
SUBJECTIVE:  No chest pain, no SOB   PHYSICAL EXAM Filed Vitals:   01/08/11 0552 01/08/11 1437 01/08/11 2041 01/09/11 0636  BP: 128/65 151/70 148/72 132/73  Pulse: 59 60 67 65  Temp: 97.6 F (36.4 C) 98.1 F (36.7 C) 97.9 F (36.6 C) 97.5 F (36.4 C)  TempSrc: Oral Oral Oral Oral  Resp: 18 18 18 18   Height:      Weight:      SpO2: 97% 95% 97% 98%   General:  No distress Lungs:  cle Heart:  RRR, no murmurs Abdomen:  Positive bowel sounds, no rebound no guarding Extremities:  No edema  LABS:  Results for orders placed during the hospital encounter of 01/05/11 (from the past 24 hour(s))  GLUCOSE, CAPILLARY     Status: Abnormal   Collection Time   01/08/11 11:35 AM      Component Value Range   Glucose-Capillary 108 (*) 70 - 99 (mg/dL)  GLUCOSE, CAPILLARY     Status: Abnormal   Collection Time   01/08/11  4:31 PM      Component Value Range   Glucose-Capillary 156 (*) 70 - 99 (mg/dL)   Comment 1 Documented in Chart     Comment 2 Notify RN    GLUCOSE, CAPILLARY     Status: Normal   Collection Time   01/08/11  8:40 PM      Component Value Range   Glucose-Capillary 82  70 - 99 (mg/dL)  GLUCOSE, CAPILLARY     Status: Abnormal   Collection Time   01/09/11  6:40 AM      Component Value Range   Glucose-Capillary 115 (*) 70 - 99 (mg/dL)   Comment 1 Notify RN    HEPARIN LEVEL     Status: Normal   Collection Time   01/09/11  6:41 AM      Component Value Range   Heparin Unfractionated 0.51  0.30 - 0.70 (IU/mL)  CBC     Status: Abnormal   Collection Time   01/09/11  6:41 AM      Component Value Range   WBC 3.6 (*) 4.0 - 10.5 (K/uL)   RBC 3.79 (*) 3.87 - 5.11 (MIL/uL)   Hemoglobin 11.6 (*) 12.0 - 15.0 (g/dL)   HCT 16.1 (*) 09.6 - 46.0 (%)   MCV 90.5  78.0 - 100.0 (fL)   MCH 30.6  26.0 - 34.0 (pg)   MCHC 33.8  30.0 - 36.0 (g/dL)   RDW 04.5  40.9 - 81.1 (%)   Platelets 111 (*) 150 - 400 (K/uL)   No intake or output data in the 24 hours ending 01/09/11  1043   ASSESSMENT AND PLAN:  Principal Problem:  *Unstable angina:  CABG Thursday Active Problems:  Type II or unspecified type diabetes mellitus without mention of complication, uncontrolled:  Continue current meds  HYPERLIPIDEMIA-MIXED:  Continue current meds  Essential hypertension, benign:  BP OK continue current meds  Thrombocytopenia:  Stable, following labs  Carotid stenosis:  Stenosis noted.  We will follow up as an outpatient.  Fayrene Fearing [facility] 01/09/2011 10:43 AM

## 2011-01-09 NOTE — Progress Notes (Signed)
4 Days Post-Op Procedure(s) (LRB): LEFT HEART CATHETERIZATION WITH CORONARY ANGIOGRAM (N/A)                     [address]            [place] [id number]          [phone]    Severe 3v diabetic CAD Ticagrelor washout day#3 CBGs better today Echo w/o valve disease CABGx4 Thurs pm 11-15   Objective: Vital signs in last 24 hours: Temp:  [97.5 F (36.4 C)-98 F (36.7 C)] 98 F (36.7 C) (11/13 1349) Pulse Rate:  [55-67] 55  (11/13 1349) Cardiac Rhythm:  [-] Sinus bradycardia (11/13 1020) Resp:  [18-19] 19  (11/13 1349) BP: (123-148)/(56-73) 123/56 mmHg (11/13 1349) SpO2:  [93 %-98 %] 93 % (11/13 1349)  Hemodynamic parameters for last 24 hours:  afebrile  Intake/Output from previous day:   Intake/Output this shift: Total I/O In: 1178 [P.O.:970; I.V.:128; Other:80] Out: -   Nsr,lungs clear  Lab Results:  Basename 01/09/11 0641 01/08/11 1008  WBC 3.6* 4.8  HGB 11.6* 12.9  HCT 34.3* 37.6  PLT 111* 127*   BMET:  Basename 01/07/11 0548  NA 138  K 3.9  CL 102  CO2 27  GLUCOSE 170*  BUN 18  CREATININE 0.73  CALCIUM 8.8    PT/INR: No results found for this basename: LABPROT,INR in the last 72 hours ABG    Component Value Date/Time   PHART 7.395 01/07/2011 1032   HCO3 24.2* 01/07/2011 1032   TCO2 25.5 01/07/2011 1032   ACIDBASEDEF 0.0 01/07/2011 1032   O2SAT 96.9 01/07/2011 1032   CBG (last 3)   Basename 01/09/11 1557 01/09/11 1118 01/09/11 0640  GLUCAP 91 91 115*    Assessment/Plan: S/P Procedure(s) (LRB): LEFT HEART CATHETERIZATION WITH CORONARY ANGIOGRAM (N/A) CABG order set submitted   LOS: 4 days    [name],Taiquan Campanaro 01/09/2011

## 2011-01-09 NOTE — Progress Notes (Signed)
ANTICOAGULATION CONSULT NOTE - Follow Up Consult  Pharmacy Consult for heparin Indication: chest pain/ACS-awaiting CABG  Allergies  Allergen Reactions  . Clopidogrel Bisulfate Hives  . Codeine Nausea And Vomiting  . Morphine Nausea And Vomiting    Patient Measurements: Height: 5\' 5"  (165.1 cm) Weight: 188 lb (85.276 kg) IBW/kg (Calculated) : 57   Vital Signs: Temp: 97.5 F (36.4 C) (11/13 0636) Temp src: Oral (11/13 0636) BP: 132/73 mmHg (11/13 0636) Pulse Rate: 65  (11/13 0636)  Labs:  Basename 01/09/11 0641 01/08/11 1008 01/08/11 0715 01/07/11 0548  HGB 11.6* 12.9 -- --  HCT 34.3* 37.6 -- 34.7*  PLT 111* 127* -- 115*  APTT -- -- -- --  LABPROT -- -- -- --  INR -- -- -- --  HEPARINUNFRC 0.51 -- 0.49 0.56  CREATININE -- -- -- 0.73  CKTOTAL -- -- -- --  CKMB -- -- -- --  TROPONINI -- -- -- --   Estimated Creatinine Clearance: 79.6 ml/min (by C-G formula based on Cr of 0.73).   Medications:  Scheduled:    . aspirin  325 mg Oral Daily  . gabapentin  300 mg Oral TID  . insulin aspart  0-15 Units Subcutaneous TID WC  . insulin aspart  84 Units Subcutaneous TID WC  . insulin glargine  50 Units Subcutaneous BID  . lisinopril  10 mg Oral Daily  . metFORMIN  500 mg Oral BID WC  . metoprolol  50 mg Oral BID  . PARoxetine  20 mg Oral QAM  . simvastatin  20 mg Oral QHS    Assessment: 1 YOF with severe 2 vessel CAD awaiting CABG later this week (awaiting ticagrelor washout). Heparin level is therapeutic. Hgb and platelets stable - h/o chronic thrombocytopenia  Goal of Therapy:  Heparin level 0.3-0.7 units/ml   Plan:  1. Continue heparin gtt at current rate of 1600 units/hr 2. Continue to monitor platelets and CBC daily  Dannielle Huh 01/09/2011,10:19 AM

## 2011-01-09 NOTE — Progress Notes (Signed)
Utilization review completed. Neoma Uhrich, RN, BSN. 01/09/11 

## 2011-01-10 ENCOUNTER — Inpatient Hospital Stay (HOSPITAL_COMMUNITY): Payer: Managed Care, Other (non HMO)

## 2011-01-10 ENCOUNTER — Encounter (HOSPITAL_COMMUNITY): Payer: Self-pay | Admitting: *Deleted

## 2011-01-10 DIAGNOSIS — I2510 Atherosclerotic heart disease of native coronary artery without angina pectoris: Secondary | ICD-10-CM

## 2011-01-10 LAB — CBC
Platelets: 116 10*3/uL — ABNORMAL LOW (ref 150–400)
RBC: 3.98 MIL/uL (ref 3.87–5.11)
WBC: 3.8 10*3/uL — ABNORMAL LOW (ref 4.0–10.5)

## 2011-01-10 LAB — GLUCOSE, CAPILLARY

## 2011-01-10 LAB — HEPARIN LEVEL (UNFRACTIONATED): Heparin Unfractionated: 0.35 IU/mL (ref 0.30–0.70)

## 2011-01-10 MED ORDER — SODIUM CHLORIDE 0.9 % IV SOLN
INTRAVENOUS | Status: AC
  Filled 2011-01-10 (×2): qty 1

## 2011-01-10 MED ORDER — CHLORHEXIDINE GLUCONATE 4 % EX LIQD
60.0000 mL | Freq: Once | CUTANEOUS | Status: AC
  Administered 2011-01-11: 4 via TOPICAL
  Filled 2011-01-10: qty 60

## 2011-01-10 MED ORDER — NITROGLYCERIN IN D5W 200-5 MCG/ML-% IV SOLN
2.0000 ug/min | INTRAVENOUS | Status: AC
Start: 2011-01-11 — End: 2011-01-11
  Administered 2011-01-11: 5 ug/kg/min via INTRAVENOUS
  Filled 2011-01-10 (×2): qty 250

## 2011-01-10 MED ORDER — DEXTROSE 5 % IV SOLN
1.5000 g | INTRAVENOUS | Status: AC
  Administered 2011-01-11: 1.5 g via INTRAVENOUS
  Filled 2011-01-10 (×2): qty 1.5

## 2011-01-10 MED ORDER — DEXTROSE 5 % IV SOLN
750.0000 mg | INTRAVENOUS | Status: DC
  Filled 2011-01-10 (×2): qty 750

## 2011-01-10 MED ORDER — PHENYLEPHRINE HCL 10 MG/ML IJ SOLN
30.0000 ug/min | INTRAVENOUS | Status: AC
  Filled 2011-01-10 (×2): qty 2

## 2011-01-10 MED ORDER — CHLORHEXIDINE GLUCONATE 4 % EX LIQD
60.0000 mL | Freq: Once | CUTANEOUS | Status: AC
  Administered 2011-01-10: 4 via TOPICAL
  Filled 2011-01-10: qty 60

## 2011-01-10 MED ORDER — PLASMA-LYTE 148 IV SOLN
INTRAVENOUS | Status: AC
  Administered 2011-01-11: 19:00:00
  Filled 2011-01-10: qty 0.5

## 2011-01-10 MED ORDER — VANCOMYCIN HCL 1000 MG IV SOLR
1250.0000 mg | INTRAVENOUS | Status: AC
  Administered 2011-01-11: 1250 mg via INTRAVENOUS
  Filled 2011-01-10 (×2): qty 1250

## 2011-01-10 MED ORDER — EPINEPHRINE HCL 1 MG/ML IJ SOLN
0.5000 ug/min | INTRAVENOUS | Status: AC
  Filled 2011-01-10 (×2): qty 4

## 2011-01-10 MED ORDER — MAGNESIUM SULFATE 50 % IJ SOLN
40.0000 meq | INTRAMUSCULAR | Status: DC
  Filled 2011-01-10: qty 10

## 2011-01-10 MED ORDER — POTASSIUM CHLORIDE 2 MEQ/ML IV SOLN
80.0000 meq | INTRAVENOUS | Status: AC
  Filled 2011-01-10: qty 40

## 2011-01-10 MED ORDER — SODIUM CHLORIDE 0.9 % IV SOLN
INTRAVENOUS | Status: AC
  Filled 2011-01-10 (×2): qty 40

## 2011-01-10 MED ORDER — DOPAMINE-DEXTROSE 3.2-5 MG/ML-% IV SOLN
2.0000 ug/kg/min | INTRAVENOUS | Status: AC
  Filled 2011-01-10 (×2): qty 250

## 2011-01-10 MED ORDER — SODIUM CHLORIDE 0.9 % IV SOLN
0.1000 ug/kg/h | INTRAVENOUS | Status: AC
  Administered 2011-01-11: .2 ug via INTRAVENOUS
  Filled 2011-01-10 (×2): qty 4

## 2011-01-10 NOTE — Progress Notes (Signed)
5 Days Post-Op Procedure(s) (LRB): LEFT HEART CATHETERIZATION WITH CORONARY ANGIOGRAM (N/A)                       [address]            [name] [id number]          [phone]    Subjective: Severe 3v CAD ,unstable angin No chest pain on IV heparin Brelinta washout day 4   Objective: Vital signs in last 24 hours: Temp:  [97.6 F (36.4 C)-98.4 F (36.9 C)] 97.6 F (36.4 C) (11/14 0428) Pulse Rate:  [55-67] 62  (11/14 0428) Cardiac Rhythm:  [-] Normal sinus rhythm (11/13 2029) Resp:  [18-19] 18  (11/14 0428) BP: (90-159)/(54-65) 90/54 mmHg (11/14 0428) SpO2:  [93 %-97 %] 97 % (11/14 0428)  Hemodynamic parameters for last 24 hours:  stable  Intake/Output from previous day: 11/13 0701 - 11/14 0700 In: 1898 [P.O.:1690; I.V.:128] Out: -  Intake/Output this shift:    Pre CABG dopplers w/ bilat mod carotid stenosis,asymptomatic  Lab Results:  Basename 01/10/11 0708 01/09/11 0641  WBC 3.8* 3.6*  HGB 12.0 11.6*  HCT 36.1 34.3*  PLT 116* 111*   BMET: No results found for this basename: NA:2,K:2,CL:2,CO2:2,GLUCOSE:2,BUN:2,CREATININE:2,CALCIUM:2 in the last 72 hours  PT/INR: No results found for this basename: LABPROT,INR in the last 72 hours ABG    Component Value Date/Time   PHART 7.395 01/07/2011 1032   HCO3 24.2* 01/07/2011 1032   TCO2 25.5 01/07/2011 1032   ACIDBASEDEF 0.0 01/07/2011 1032   O2SAT 96.9 01/07/2011 1032   CBG (last 3)   Basename 01/10/11 0603 01/09/11 2137 01/09/11 1557  GLUCAP 230* 159* 91    Assessment/Plan: S/P Procedure(s) (LRB): LEFT HEART CATHETERIZATION WITH CORONARY ANGIOGRAM (N/A) CABG tomorrow,pt will prob need blood product transfusion due to hx, preop anemia   LOS: 5 days    [name],Rhiann Boucher 01/10/2011

## 2011-01-10 NOTE — Progress Notes (Signed)
SUBJECTIVE:  No chest pain.  No SOB   PHYSICAL EXAM Filed Vitals:   01/09/11 0636 01/09/11 1349 01/09/11 2139 01/10/11 0428  BP: 132/73 123/56 159/65 90/54  Pulse: 65 55 67 62  Temp: 97.5 F (36.4 C) 98 F (36.7 C) 98.4 F (36.9 C) 97.6 F (36.4 C)  TempSrc: Oral Oral Oral Oral  Resp: 18 19 18 18   Height:      Weight:      SpO2: 98% 93% 97% 97%   General:  No distress Lungs:  Clear Heart:  RRR, no murmur Abdomen:  Positive bowel sounds, no rebound no guarding Extremities:  No edema  LABS:  Results for orders placed during the hospital encounter of 01/05/11 (from the past 24 hour(s))  GLUCOSE, CAPILLARY     Status: Normal   Collection Time   01/09/11 11:18 AM      Component Value Range   Glucose-Capillary 91  70 - 99 (mg/dL)  GLUCOSE, CAPILLARY     Status: Normal   Collection Time   01/09/11  3:57 PM      Component Value Range   Glucose-Capillary 91  70 - 99 (mg/dL)  PREPARE RBC (CROSSMATCH)     Status: Normal   Collection Time   01/09/11  6:53 PM      Component Value Range   Order Confirmation ORDER PROCESSED BY BLOOD BANK    TYPE AND SCREEN     Status: Normal (Preliminary result)   Collection Time   01/09/11  7:00 PM      Component Value Range   ABO/RH(D) B POS     Antibody Screen NEG     Sample Expiration 01/12/2011     Unit Number [id number]     Blood Component Type RED CELLS,LR     Unit division 00     Status of Unit ALLOCATED     Transfusion Status OK TO TRANSFUSE     Crossmatch Result Compatible     Unit Number [id number]     Blood Component Type RED CELLS,LR     Unit division 00     Status of Unit ALLOCATED     Transfusion Status OK TO TRANSFUSE     Crossmatch Result Compatible    ABO/RH     Status: Normal   Collection Time   01/09/11  7:00 PM      Component Value Range   ABO/RH(D) B POS    GLUCOSE, CAPILLARY     Status: Abnormal   Collection Time   01/09/11  9:37 PM      Component Value Range   Glucose-Capillary 159 (*) 70 - 99 (mg/dL)   Comment 1 Documented in Chart     Comment 2 Notify RN    GLUCOSE, CAPILLARY     Status: Abnormal   Collection Time   01/10/11  6:03 AM      Component Value Range   Glucose-Capillary 230 (*) 70 - 99 (mg/dL)   Comment 1 Notify RN      Intake/Output Summary (Last 24 hours) at 01/10/11 0723 Last data filed at 01/09/11 2100  Gross per 24 hour  Intake   1898 ml  Output      0 ml  Net   1898 ml    ASSESSMENT AND PLAN:  Principal Problem:  *Unstable angina:  CABG in am. Active Problems:  Type II or unspecified type diabetes mellitus without mention of complication, uncontrolled:  Continue current meds.  HgA1c is elevated and will be further  addressed post op and after the hospitalization  HYPERLIPIDEMIA-MIXED  Essential hypertension, benign:  Continue current therapy  Thrombocytopenia:  Platelets have been stable  Carotid stenosis:  Follow up as an outpatient    Rollene Rotunda 01/10/2011 7:23 AM

## 2011-01-10 NOTE — Progress Notes (Signed)
ANTICOAGULATION CONSULT NOTE - Follow Up Consult  Pharmacy Consult for heparin Indication: chest pain/ACS-awaiting CABG   Allergies  Allergen Reactions  . Clopidogrel Bisulfate Hives  . Codeine Nausea And Vomiting  . Morphine Nausea And Vomiting    Patient Measurements: Height: 5\' 5"  (165.1 cm) Weight: 188 lb (85.276 kg) IBW/kg (Calculated) : 57   Vital Signs: Temp: 97.6 F (36.4 C) (11/14 0428) Temp src: Oral (11/14 0428) BP: 90/54 mmHg (11/14 0428) Pulse Rate: 62  (11/14 0428)  Labs:  Basename 01/10/11 0708 01/09/11 0641 01/08/11 1008 01/08/11 0715  HGB 12.0 11.6* -- --  HCT 36.1 34.3* 37.6 --  PLT 116* 111* 127* --  APTT -- -- -- --  LABPROT -- -- -- --  INR -- -- -- --  HEPARINUNFRC 0.35 0.51 -- 0.49  CREATININE -- -- -- --  CKTOTAL -- -- -- --  CKMB -- -- -- --  TROPONINI -- -- -- --   Estimated Creatinine Clearance: 79.6 ml/min (by C-G formula based on Cr of 0.73).   Medications:  Scheduled:    . aspirin  325 mg Oral Daily  . bisacodyl  5 mg Oral Once  . chlorhexidine  60 mL Topical Once  . chlorhexidine  60 mL Topical Once  . gabapentin  300 mg Oral TID  . insulin aspart  0-15 Units Subcutaneous TID WC  . insulin aspart  84 Units Subcutaneous TID WC  . insulin glargine  50 Units Subcutaneous BID  . lisinopril  10 mg Oral Daily  . metFORMIN  500 mg Oral BID WC  . metoprolol  50 mg Oral BID  . metoprolol tartrate  12.5 mg Oral Once  . PARoxetine  20 mg Oral QAM  . simvastatin  20 mg Oral QHS  . DISCONTD: chlorhexidine  60 mL Topical Once  . DISCONTD: chlorhexidine  60 mL Topical Once  . DISCONTD: chlorhexidine  60 mL Topical Once    Assessment: 53 YOF with severe 2 vessel CAD awaiting CABG tom 11/15 (awaiting ticagrelor washout). Heparin level is therapeutic. Hgb and platelets stable - h/o chronic thrombocytopenia   Goal of Therapy:  Heparin level 0.3-0.7 units/ml   Plan:  1. Continue heparin at 1600 units/hr.  FOR [facility] tomorrow.    Dannielle Huh 01/10/2011,9:42 AM

## 2011-01-11 ENCOUNTER — Inpatient Hospital Stay (HOSPITAL_COMMUNITY): Payer: Managed Care, Other (non HMO)

## 2011-01-11 ENCOUNTER — Other Ambulatory Visit: Payer: Self-pay

## 2011-01-11 ENCOUNTER — Encounter (HOSPITAL_COMMUNITY)
Admission: RE | Disposition: A | Discharge status: Home Health | Payer: Self-pay | Source: Ambulatory Visit | Attending: Cardiothoracic Surgery

## 2011-01-11 ENCOUNTER — Encounter (HOSPITAL_COMMUNITY): Payer: Self-pay | Admitting: Anesthesiology

## 2011-01-11 ENCOUNTER — Inpatient Hospital Stay (HOSPITAL_COMMUNITY): Payer: Managed Care, Other (non HMO) | Admitting: Anesthesiology

## 2011-01-11 DIAGNOSIS — I2510 Atherosclerotic heart disease of native coronary artery without angina pectoris: Secondary | ICD-10-CM

## 2011-01-11 HISTORY — PX: CORONARY ARTERY BYPASS GRAFT: SHX141

## 2011-01-11 LAB — POCT I-STAT 3, ART BLOOD GAS (G3+)
Acid-Base Excess: 2 mmol/L (ref 0.0–2.0)
Acid-Base Excess: 1 mmol/L (ref 0.0–2.0)
Acid-base deficit: 2 mmol/L (ref 0.0–2.0)
Acid-base deficit: 2 mmol/L (ref 0.0–2.0)
Bicarbonate: 24.5 mEq/L — ABNORMAL HIGH (ref 20.0–24.0)
O2 Saturation: 100 %
O2 Saturation: 100 %
O2 Saturation: 100 %
Patient temperature: 36.4
TCO2: 30 mmol/L (ref 0–100)
TCO2: 24 mmol/L (ref 0–100)
TCO2: 26 mmol/L (ref 0–100)
pCO2 arterial: 35.2 mmHg (ref 35.0–45.0)
pO2, Arterial: 549 mmHg — ABNORMAL HIGH (ref 80.0–100.0)
pO2, Arterial: 261 mmHg — ABNORMAL HIGH (ref 80.0–100.0)
pO2, Arterial: 140 mmHg — ABNORMAL HIGH (ref 80.0–100.0)

## 2011-01-11 LAB — POCT I-STAT 4, (NA,K, GLUC, HGB,HCT)
Glucose, Bld: 147 mg/dL — ABNORMAL HIGH (ref 70–99)
Glucose, Bld: 115 mg/dL — ABNORMAL HIGH (ref 70–99)
Glucose, Bld: 128 mg/dL — ABNORMAL HIGH (ref 70–99)
Glucose, Bld: 255 mg/dL — ABNORMAL HIGH (ref 70–99)
HCT: 31 % — ABNORMAL LOW (ref 36.0–46.0)
HCT: 26 % — ABNORMAL LOW (ref 36.0–46.0)
HCT: 25 % — ABNORMAL LOW (ref 36.0–46.0)
HCT: 25 % — ABNORMAL LOW (ref 36.0–46.0)
Hemoglobin: 8.8 g/dL — ABNORMAL LOW (ref 12.0–15.0)
Hemoglobin: 9.5 g/dL — ABNORMAL LOW (ref 12.0–15.0)
Hemoglobin: 8.5 g/dL — ABNORMAL LOW (ref 12.0–15.0)
Hemoglobin: 7.8 g/dL — ABNORMAL LOW (ref 12.0–15.0)
Potassium: 4.2 mEq/L (ref 3.5–5.1)
Potassium: 4.7 mEq/L (ref 3.5–5.1)
Potassium: 4.4 mEq/L (ref 3.5–5.1)
Potassium: 3.8 mEq/L (ref 3.5–5.1)
Sodium: 142 mEq/L (ref 135–145)
Sodium: 138 mEq/L (ref 135–145)
Sodium: 140 mEq/L (ref 135–145)
Sodium: 140 mEq/L (ref 135–145)
Sodium: 145 mEq/L (ref 135–145)
Sodium: 144 mEq/L (ref 135–145)

## 2011-01-11 LAB — CBC
HCT: 35.3 % — ABNORMAL LOW (ref 36.0–46.0)
HCT: 23.4 % — ABNORMAL LOW (ref 36.0–46.0)
Hemoglobin: 12.2 g/dL (ref 12.0–15.0)
Hemoglobin: 8.1 g/dL — ABNORMAL LOW (ref 12.0–15.0)
MCH: 30.7 pg (ref 26.0–34.0)
MCHC: 34.6 g/dL (ref 30.0–36.0)
MCV: 88.9 fL (ref 78.0–100.0)
MCV: 89.7 fL (ref 78.0–100.0)
Platelets: 108 10*3/uL — ABNORMAL LOW (ref 150–400)
RBC: 3.97 MIL/uL (ref 3.87–5.11)
RBC: 2.61 MIL/uL — ABNORMAL LOW (ref 3.87–5.11)
RDW: 12.7 % (ref 11.5–15.5)
RDW: 13 % (ref 11.5–15.5)
WBC: 3.8 10*3/uL — ABNORMAL LOW (ref 4.0–10.5)
WBC: 3 10*3/uL — ABNORMAL LOW (ref 4.0–10.5)

## 2011-01-11 LAB — BASIC METABOLIC PANEL
BUN: 16 mg/dL (ref 6–23)
CO2: 25 mEq/L (ref 19–32)
Calcium: 9.3 mg/dL (ref 8.4–10.5)
Chloride: 104 mEq/L (ref 96–112)
Creatinine, Ser: 0.66 mg/dL (ref 0.50–1.10)
GFR calc Af Amer: >90 mL/min (ref >90)
GFR calc non Af Amer: >90 mL/min (ref >90)
Glucose, Bld: 135 mg/dL — ABNORMAL HIGH (ref 70–99)
Potassium: 4.2 mEq/L (ref 3.5–5.1)
Sodium: 141 mEq/L (ref 135–145)

## 2011-01-11 LAB — APTT: aPTT: 37 seconds (ref 24–37)

## 2011-01-11 LAB — GLUCOSE, CAPILLARY: Glucose-Capillary: 156 mg/dL — ABNORMAL HIGH (ref 70–99)

## 2011-01-11 LAB — HEMOGLOBIN AND HEMATOCRIT, BLOOD
HCT: 24.4 % — ABNORMAL LOW (ref 36.0–46.0)
Hemoglobin: 8.5 g/dL — ABNORMAL LOW (ref 12.0–15.0)

## 2011-01-11 LAB — HEPARIN LEVEL (UNFRACTIONATED): Heparin Unfractionated: 0.52 IU/mL (ref 0.30–0.70)

## 2011-01-11 LAB — PROTIME-INR
INR: 1.02 (ref 0.00–1.49)
INR: 1.41 (ref 0.00–1.49)
Prothrombin Time: 13.6 seconds (ref 11.6–15.2)

## 2011-01-11 SURGERY — CORONARY ARTERY BYPASS GRAFTING (CABG)
Anesthesia: General | Site: Chest | Wound class: Clean

## 2011-01-11 MED ORDER — HEPARIN SODIUM (PORCINE) 1000 UNIT/ML IJ SOLN
INTRAMUSCULAR | Status: DC | PRN
  Administered 2011-01-11: 19000 [IU] via INTRAVENOUS
  Administered 2011-01-11: 5000 [IU] via INTRAVENOUS

## 2011-01-11 MED ORDER — AMINOCAPROIC ACID 250 MG/ML IV SOLN
INTRAVENOUS | Status: DC | PRN
  Administered 2011-01-11 (×5): 1 g via INTRAVENOUS
  Administered 2011-01-11: 5 g via INTRAVENOUS

## 2011-01-11 MED ORDER — DEXMEDETOMIDINE HCL 100 MCG/ML IV SOLN
0.4000 ug/kg/h | INTRAVENOUS | Status: DC
  Filled 2011-01-11: qty 2

## 2011-01-11 MED ORDER — METOPROLOL TARTRATE 1 MG/ML IV SOLN
2.5000 mg | INTRAVENOUS | Status: DC | PRN
  Administered 2011-01-14 – 2011-01-15 (×2): 5 mg via INTRAVENOUS
  Filled 2011-01-11: qty 5

## 2011-01-11 MED ORDER — LACTATED RINGERS IV SOLN
INTRAVENOUS | Status: DC | PRN
  Administered 2011-01-11 (×2): via INTRAVENOUS

## 2011-01-11 MED ORDER — MAGNESIUM SULFATE 50 % IJ SOLN
4.0000 g | Freq: Once | INTRAVENOUS | Status: AC
  Administered 2011-01-11: 4 g via INTRAVENOUS
  Filled 2011-01-11: qty 8

## 2011-01-11 MED ORDER — SODIUM CHLORIDE 0.9 % IV SOLN
0.1000 ug/kg/h | INTRAVENOUS | Status: DC
  Administered 2011-01-11: 0.7 ug/kg/h via INTRAVENOUS
  Filled 2011-01-11: qty 2

## 2011-01-11 MED ORDER — MIDAZOLAM HCL 2 MG/2ML IJ SOLN
INTRAMUSCULAR | Status: AC
  Filled 2011-01-11: qty 2

## 2011-01-11 MED ORDER — PROTAMINE SULFATE 10 MG/ML IV SOLN
INTRAVENOUS | Status: DC | PRN
  Administered 2011-01-11: 250 mg via INTRAVENOUS

## 2011-01-11 MED ORDER — DEXTROSE 5 % IV SOLN
1.5000 g | Freq: Two times a day (BID) | INTRAVENOUS | Status: AC
  Administered 2011-01-11 – 2011-01-13 (×4): 1.5 g via INTRAVENOUS
  Filled 2011-01-11 (×4): qty 1.5

## 2011-01-11 MED ORDER — DOPAMINE-DEXTROSE 3.2-5 MG/ML-% IV SOLN
INTRAVENOUS | Status: DC | PRN
  Administered 2011-01-11: 3 ug/kg/min via INTRAVENOUS

## 2011-01-11 MED ORDER — ASPIRIN 81 MG PO CHEW
324.0000 mg | CHEWABLE_TABLET | Freq: Every day | ORAL | Status: DC
  Administered 2011-01-16: 324 mg
  Filled 2011-01-11: qty 4

## 2011-01-11 MED ORDER — ONDANSETRON HCL 4 MG/2ML IJ SOLN
4.0000 mg | Freq: Four times a day (QID) | INTRAMUSCULAR | Status: DC | PRN
  Administered 2011-01-12 – 2011-01-16 (×2): 4 mg via INTRAVENOUS
  Filled 2011-01-11: qty 2

## 2011-01-11 MED ORDER — FENTANYL CITRATE 0.05 MG/ML IJ SOLN
50.0000 ug | INTRAMUSCULAR | Status: DC | PRN
  Administered 2011-01-11 – 2011-01-14 (×10): 50 ug via INTRAVENOUS
  Administered 2011-01-15: 25 ug via INTRAVENOUS
  Administered 2011-01-15 (×3): 50 ug via INTRAVENOUS
  Administered 2011-01-15 – 2011-01-16 (×2): 25 ug via INTRAVENOUS
  Filled 2011-01-11 (×7): qty 2

## 2011-01-11 MED ORDER — MIDAZOLAM HCL 5 MG/5ML IJ SOLN
INTRAMUSCULAR | Status: DC | PRN
  Administered 2011-01-11: 3 mg via INTRAVENOUS
  Administered 2011-01-11: 7 mg via INTRAVENOUS

## 2011-01-11 MED ORDER — HEMOSTATIC AGENTS (NO CHARGE) OPTIME
TOPICAL | Status: DC | PRN
  Administered 2011-01-11: 1 via TOPICAL

## 2011-01-11 MED ORDER — ACETAMINOPHEN 160 MG/5ML PO SOLN
650.0000 mg | ORAL | Status: AC
  Administered 2011-01-11: 649.6 mg

## 2011-01-11 MED ORDER — TRAMADOL HCL 50 MG PO TABS
50.0000 mg | ORAL_TABLET | ORAL | Status: DC | PRN
  Administered 2011-01-12 – 2011-01-13 (×3): 50 mg via ORAL
  Filled 2011-01-11 (×3): qty 1

## 2011-01-11 MED ORDER — DOCUSATE SODIUM 100 MG PO CAPS
200.0000 mg | ORAL_CAPSULE | Freq: Every day | ORAL | Status: DC
  Administered 2011-01-12 – 2011-01-19 (×6): 200 mg via ORAL
  Filled 2011-01-11 (×8): qty 2

## 2011-01-11 MED ORDER — SUFENTANIL CITRATE 50 MCG/ML IV SOLN
INTRAVENOUS | Status: DC | PRN
  Administered 2011-01-11 (×2): 10 ug via INTRAVENOUS
  Administered 2011-01-11: 40 ug via INTRAVENOUS
  Administered 2011-01-11: 60 ug via INTRAVENOUS

## 2011-01-11 MED ORDER — FAMOTIDINE IN NACL 20-0.9 MG/50ML-% IV SOLN
20.0000 mg | Freq: Two times a day (BID) | INTRAVENOUS | Status: DC
  Filled 2011-01-11: qty 50

## 2011-01-11 MED ORDER — LISINOPRIL 10 MG PO TABS
10.0000 mg | ORAL_TABLET | Freq: Every day | ORAL | Status: DC
  Administered 2011-01-13: 10 mg via ORAL
  Filled 2011-01-11 (×2): qty 1

## 2011-01-11 MED ORDER — SODIUM CHLORIDE 0.9 % IV SOLN
INTRAVENOUS | Status: DC
  Administered 2011-01-11: 6.8 [IU]/h via INTRAVENOUS
  Administered 2011-01-12: 8.1 [IU]/h via INTRAVENOUS
  Administered 2011-01-12: 4.1 [IU]/h via INTRAVENOUS
  Administered 2011-01-12: 6.9 [IU]/h via INTRAVENOUS
  Administered 2011-01-12: 8.2 [IU]/h via INTRAVENOUS
  Administered 2011-01-12: 10.6 [IU]/h via INTRAVENOUS
  Administered 2011-01-12: 16.6 [IU]/h via INTRAVENOUS
  Administered 2011-01-12: 9.9 [IU]/h via INTRAVENOUS
  Administered 2011-01-12: 20.2 [IU]/h via INTRAVENOUS
  Administered 2011-01-13: 4.2 [IU]/h via INTRAVENOUS
  Administered 2011-01-13: 4.7 [IU]/h via INTRAVENOUS
  Administered 2011-01-13: 6.9 [IU]/h via INTRAVENOUS
  Administered 2011-01-13: 9.6 [IU]/h via INTRAVENOUS
  Administered 2011-01-13: 12.3 [IU]/h via INTRAVENOUS
  Administered 2011-01-13: 8.7 [IU]/h via INTRAVENOUS
  Administered 2011-01-13: 8 [IU]/h via INTRAVENOUS
  Administered 2011-01-13: 7.3 [IU]/h via INTRAVENOUS
  Administered 2011-01-13: 7 [IU]/h via INTRAVENOUS
  Administered 2011-01-14: 7.7 [IU]/h via INTRAVENOUS
  Administered 2011-01-14: 12:00:00 via INTRAVENOUS
  Filled 2011-01-11 (×5): qty 1

## 2011-01-11 MED ORDER — ALBUMIN HUMAN 5 % IV SOLN
250.0000 mL | INTRAVENOUS | Status: DC | PRN
  Administered 2011-01-12 – 2011-01-14 (×3): 250 mL via INTRAVENOUS
  Filled 2011-01-11 (×2): qty 250

## 2011-01-11 MED ORDER — CEFUROXIME SODIUM 750 MG IJ SOLR
INTRAMUSCULAR | Status: DC | PRN
  Administered 2011-01-11: 750 mg via INTRAMUSCULAR

## 2011-01-11 MED ORDER — SODIUM CHLORIDE 0.9 % IV SOLN
1250.0000 mg | Freq: Once | INTRAVENOUS | Status: DC
  Filled 2011-01-11: qty 1250

## 2011-01-11 MED ORDER — PAROXETINE HCL 20 MG PO TABS
20.0000 mg | ORAL_TABLET | ORAL | Status: DC
  Administered 2011-01-15 – 2011-01-25 (×11): 20 mg via ORAL
  Filled 2011-01-11 (×13): qty 1

## 2011-01-11 MED ORDER — SODIUM CHLORIDE 0.9 % IJ SOLN
3.0000 mL | Freq: Two times a day (BID) | INTRAMUSCULAR | Status: DC
  Administered 2011-01-12 – 2011-01-15 (×5): 3 mL via INTRAVENOUS
  Administered 2011-01-15: 23:00:00 via INTRAVENOUS

## 2011-01-11 MED ORDER — SODIUM CHLORIDE 0.45 % IV SOLN
INTRAVENOUS | Status: DC
  Administered 2011-01-11: 20 mL/h via INTRAVENOUS
  Administered 2011-01-17 – 2011-01-18 (×2): via INTRAVENOUS

## 2011-01-11 MED ORDER — SODIUM CHLORIDE 0.9 % IV SOLN
1000.0000 mg | Freq: Once | INTRAVENOUS | Status: AC
  Administered 2011-01-12: 1000 mg via INTRAVENOUS
  Filled 2011-01-11: qty 1000

## 2011-01-11 MED ORDER — PANTOPRAZOLE SODIUM 40 MG PO TBEC
40.0000 mg | DELAYED_RELEASE_TABLET | Freq: Every day | ORAL | Status: DC
  Administered 2011-01-13 – 2011-01-20 (×7): 40 mg via ORAL
  Filled 2011-01-11 (×7): qty 1

## 2011-01-11 MED ORDER — LACTATED RINGERS IV SOLN: 500.0000 mL | Freq: Once | INTRAVENOUS | Status: AC | PRN

## 2011-01-11 MED ORDER — SIMVASTATIN 20 MG PO TABS
20.0000 mg | ORAL_TABLET | Freq: Every day | ORAL | Status: DC
Start: 2011-01-14 — End: 2011-01-25
  Administered 2011-01-16 – 2011-01-24 (×9): 20 mg via ORAL
  Filled 2011-01-11 (×13): qty 1

## 2011-01-11 MED ORDER — BISACODYL 10 MG RE SUPP: 10.0000 mg | Freq: Every day | RECTAL | Status: DC

## 2011-01-11 MED ORDER — ACETAMINOPHEN 650 MG RE SUPP: 650.0000 mg | RECTAL | Status: AC

## 2011-01-11 MED ORDER — SODIUM CHLORIDE 0.9 % IV SOLN
INTRAVENOUS | Status: DC | PRN
  Administered 2011-01-11 (×2): via INTRAVENOUS

## 2011-01-11 MED ORDER — METFORMIN HCL 500 MG PO TABS: 500.0000 mg | ORAL_TABLET | Freq: Two times a day (BID) | ORAL | Status: DC

## 2011-01-11 MED ORDER — METOPROLOL TARTRATE 12.5 MG HALF TABLET
12.5000 mg | ORAL_TABLET | Freq: Two times a day (BID) | ORAL | Status: DC
  Administered 2011-01-13 – 2011-01-17 (×7): 12.5 mg via ORAL
  Filled 2011-01-11 (×15): qty 1

## 2011-01-11 MED ORDER — ALBUMIN HUMAN 5 % IV SOLN
INTRAVENOUS | Status: DC | PRN
  Administered 2011-01-11 (×2): via INTRAVENOUS

## 2011-01-11 MED ORDER — DOPAMINE-DEXTROSE 3.2-5 MG/ML-% IV SOLN: 0.0000 ug/kg/min | INTRAVENOUS | Status: DC

## 2011-01-11 MED ORDER — LACTATED RINGERS IV SOLN
INTRAVENOUS | Status: DC | PRN
  Administered 2011-01-11 (×2): via INTRAVENOUS

## 2011-01-11 MED ORDER — MAGNESIUM SULFATE 40 MG/ML IJ SOLN
INTRAMUSCULAR | Status: AC
Start: 2011-01-11 — End: 2011-01-11
  Administered 2011-01-11: 20:00:00
  Filled 2011-01-11: qty 100

## 2011-01-11 MED ORDER — MIDAZOLAM HCL 2 MG/2ML IJ SOLN
2.0000 mg | INTRAMUSCULAR | Status: DC | PRN
  Administered 2011-01-11 – 2011-01-14 (×2): 2 mg via INTRAVENOUS
  Filled 2011-01-11: qty 2

## 2011-01-11 MED ORDER — LACTATED RINGERS IV SOLN
INTRAVENOUS | Status: DC
  Administered 2011-01-11: 20 mL/h via INTRAVENOUS

## 2011-01-11 MED ORDER — SODIUM CHLORIDE 0.9 % IV SOLN: 250.0000 mL | INTRAVENOUS | Status: DC

## 2011-01-11 MED ORDER — FENTANYL CITRATE 0.05 MG/ML IJ SOLN
INTRAMUSCULAR | Status: AC
  Filled 2011-01-11: qty 2

## 2011-01-11 MED ORDER — PAPAVERINE HCL 30 MG/ML IJ SOLN
INTRAMUSCULAR | Status: DC | PRN
  Administered 2011-01-11: 60 mg via INTRAVENOUS

## 2011-01-11 MED ORDER — ROCURONIUM BROMIDE 100 MG/10ML IV SOLN
INTRAVENOUS | Status: DC | PRN
  Administered 2011-01-11 (×3): 20 mg via INTRAVENOUS
  Administered 2011-01-11: 80 mg via INTRAVENOUS
  Administered 2011-01-11 (×2): 20 mg via INTRAVENOUS

## 2011-01-11 MED ORDER — PROPOFOL 10 MG/ML IV EMUL
INTRAVENOUS | Status: DC | PRN
  Administered 2011-01-11: 70 mg via INTRAVENOUS

## 2011-01-11 MED ORDER — ACETAMINOPHEN 160 MG/5ML PO SOLN
975.0000 mg | Freq: Four times a day (QID) | ORAL | Status: AC
  Filled 2011-01-11: qty 40.6

## 2011-01-11 MED ORDER — SODIUM CHLORIDE 0.9 % IV SOLN: INTRAVENOUS | Status: DC

## 2011-01-11 MED ORDER — POTASSIUM CHLORIDE 10 MEQ/50ML IV SOLN
10.0000 meq | INTRAVENOUS | Status: AC
  Administered 2011-01-11 (×3): 10 meq via INTRAVENOUS

## 2011-01-11 MED ORDER — NITROGLYCERIN IN D5W 200-5 MCG/ML-% IV SOLN: 0.0000 ug/min | INTRAVENOUS | Status: DC

## 2011-01-11 MED ORDER — INSULIN GLARGINE 100 UNIT/ML ~~LOC~~ SOLN
50.0000 [IU] | Freq: Two times a day (BID) | SUBCUTANEOUS | Status: DC
  Filled 2011-01-11: qty 3

## 2011-01-11 MED ORDER — SODIUM CHLORIDE 0.9 % IJ SOLN: 3.0000 mL | INTRAMUSCULAR | Status: DC | PRN

## 2011-01-11 MED ORDER — SODIUM CHLORIDE 0.9 % IV SOLN
100.0000 [IU] | INTRAVENOUS | Status: DC | PRN
  Administered 2011-01-11: 2 [IU]/h via INTRAVENOUS
  Administered 2011-01-11: 2.6 [IU]/h via INTRAVENOUS

## 2011-01-11 MED ORDER — MIDAZOLAM HCL 2 MG/2ML IJ SOLN
1.0000 mg | INTRAMUSCULAR | Status: DC | PRN
  Administered 2011-01-11 – 2011-01-14 (×3): 2 mg via INTRAVENOUS
  Filled 2011-01-11: qty 2
  Filled 2011-01-11: qty 4
  Filled 2011-01-11: qty 2

## 2011-01-11 MED ORDER — LACTATED RINGERS IV SOLN
INTRAVENOUS | Status: DC | PRN
  Administered 2011-01-11: 14:00:00 via INTRAVENOUS

## 2011-01-11 MED ORDER — ASPIRIN EC 325 MG PO TBEC
325.0000 mg | DELAYED_RELEASE_TABLET | Freq: Every day | ORAL | Status: DC
  Administered 2011-01-12 – 2011-01-20 (×6): 325 mg via ORAL
  Filled 2011-01-11 (×10): qty 1

## 2011-01-11 MED ORDER — BISACODYL 5 MG PO TBEC
10.0000 mg | DELAYED_RELEASE_TABLET | Freq: Every day | ORAL | Status: DC
  Administered 2011-01-12 – 2011-01-19 (×6): 10 mg via ORAL
  Filled 2011-01-11 (×6): qty 2

## 2011-01-11 MED ORDER — FENTANYL CITRATE 0.05 MG/ML IJ SOLN
50.0000 ug | INTRAMUSCULAR | Status: DC | PRN
  Administered 2011-01-11: 100 ug via INTRAVENOUS
  Administered 2011-01-12: 50 ug via INTRAVENOUS
  Administered 2011-01-15 (×2): 100 ug via INTRAVENOUS
  Filled 2011-01-11 (×4): qty 2

## 2011-01-11 MED ORDER — LACTATED RINGERS IV SOLN
INTRAVENOUS | Status: DC
  Administered 2011-01-11: 13:00:00 via INTRAVENOUS

## 2011-01-11 MED ORDER — METOPROLOL TARTRATE 25 MG/10 ML ORAL SUSPENSION
12.5000 mg | Freq: Two times a day (BID) | ORAL | Status: DC
  Filled 2011-01-11 (×15): qty 5

## 2011-01-11 MED ORDER — ACETAMINOPHEN 500 MG PO TABS
1000.0000 mg | ORAL_TABLET | Freq: Four times a day (QID) | ORAL | Status: AC
  Administered 2011-01-12 – 2011-01-16 (×12): 1000 mg via ORAL
  Filled 2011-01-11 (×20): qty 2

## 2011-01-11 MED ORDER — PHENYLEPHRINE HCL 10 MG/ML IJ SOLN
0.0000 ug/min | INTRAVENOUS | Status: DC
  Filled 2011-01-11: qty 2

## 2011-01-11 SURGICAL SUPPLY — 132 items
ADAPTER CARDIO PERF ANTE/RETRO (ADAPTER) ×3 IMPLANT
APPLIER CLIP 9.375 MED OPEN (MISCELLANEOUS)
APPLIER CLIP 9.375 SM OPEN (CLIP)
ATTRACTOMAT 16X20 MAGNETIC DRP (DRAPES) ×3 IMPLANT
BAG DECANTER FOR FLEXI CONT (MISCELLANEOUS) ×3 IMPLANT
BANDAGE ELASTIC 4 VELCRO ST LF (GAUZE/BANDAGES/DRESSINGS) ×6 IMPLANT
BANDAGE ELASTIC 6 VELCRO ST LF (GAUZE/BANDAGES/DRESSINGS) ×6 IMPLANT
BANDAGE GAUZE ELAST BULKY 4 IN (GAUZE/BANDAGES/DRESSINGS) ×6 IMPLANT
BASKET HEART  (ORDER IN 25'S) (MISCELLANEOUS) ×1
BASKET HEART (ORDER IN 25'S) (MISCELLANEOUS) ×1
BASKET HEART (ORDER IN 25S) (MISCELLANEOUS) ×1 IMPLANT
BLADE SAW STERNAL (BLADE) ×3 IMPLANT
BLADE SURG 12 STRL SS (BLADE) ×3 IMPLANT
BLADE SURG ROTATE 9660 (MISCELLANEOUS) ×3 IMPLANT
CANISTER SUCTION 2500CC (MISCELLANEOUS) ×3 IMPLANT
CANNULA AORTIC ROOT 20012 (MISCELLANEOUS) IMPLANT
CANNULA GUNDRY RCSP 15FR (MISCELLANEOUS) ×3 IMPLANT
CANNULA VESSEL W/WING WO/VALVE (CANNULA) ×3 IMPLANT
CATH ROBINSON RED A/P 18FR (CATHETERS) ×9 IMPLANT
CATH THORACIC 28FR (CATHETERS) ×3 IMPLANT
CATH THORACIC 28FR RT ANG (CATHETERS) ×3 IMPLANT
CATH THORACIC 36FR (CATHETERS) ×3 IMPLANT
CATH THORACIC 36FR RT ANG (CATHETERS) ×6 IMPLANT
CLIP APPLIE 9.375 MED OPEN (MISCELLANEOUS) IMPLANT
CLIP APPLIE 9.375 SM OPEN (CLIP) IMPLANT
CLIP FOGARTY SPRING 6M (CLIP) ×3 IMPLANT
CLIP RETRACTION 3.0MM CORONARY (MISCELLANEOUS) ×3 IMPLANT
CLIP TI MEDIUM 24 (CLIP) IMPLANT
CLIP TI WIDE RED SMALL 24 (CLIP) ×3 IMPLANT
CLOTH BEACON ORANGE TIMEOUT ST (SAFETY) ×3 IMPLANT
CONN Y 3/8X3/8X3/8  BEN (MISCELLANEOUS)
CONN Y 3/8X3/8X3/8 BEN (MISCELLANEOUS) IMPLANT
COVER SURGICAL LIGHT HANDLE (MISCELLANEOUS) ×6 IMPLANT
CRADLE DONUT ADULT HEAD (MISCELLANEOUS) ×3 IMPLANT
DRAPE CARDIOVASCULAR INCISE (DRAPES) ×2
DRAPE SLUSH MACHINE 52X66 (DRAPES) IMPLANT
DRAPE SLUSH/WARMER DISC (DRAPES) ×3 IMPLANT
DRAPE SRG 135X102X78XABS (DRAPES) ×1 IMPLANT
DRSG COVADERM 4X14 (GAUZE/BANDAGES/DRESSINGS) ×3 IMPLANT
ELECT BLADE 6.5 EXT (BLADE) ×3 IMPLANT
ELECT CAUTERY BLADE 6.4 (BLADE) ×3 IMPLANT
ELECT PAD GROUND ADT 9 (MISCELLANEOUS) IMPLANT
ELECT REM PT RETURN 9FT ADLT (ELECTROSURGICAL) ×6
ELECTRODE REM PT RTRN 9FT ADLT (ELECTROSURGICAL) ×2 IMPLANT
GAUZE SPONGE 4X4 12PLY STRL LF (GAUZE/BANDAGES/DRESSINGS) ×15 IMPLANT
GLOVE BIO SURGEON STRL SZ 6 (GLOVE) ×9 IMPLANT
GLOVE BIO SURGEON STRL SZ 6.5 (GLOVE) ×6 IMPLANT
GLOVE BIO SURGEON STRL SZ7 (GLOVE) IMPLANT
GLOVE BIO SURGEON STRL SZ7.5 (GLOVE) ×18 IMPLANT
GLOVE BIO SURGEONS STRL SZ 6.5 (GLOVE) ×3
GLOVE BIOGEL PI IND STRL 6 (GLOVE) ×1 IMPLANT
GLOVE BIOGEL PI IND STRL 6.5 (GLOVE) ×3 IMPLANT
GLOVE BIOGEL PI IND STRL 7.0 (GLOVE) ×6 IMPLANT
GLOVE BIOGEL PI INDICATOR 6 (GLOVE) ×2
GLOVE BIOGEL PI INDICATOR 6.5 (GLOVE) ×6
GLOVE BIOGEL PI INDICATOR 7.0 (GLOVE) ×12
GLOVE EUDERMIC 7 POWDERFREE (GLOVE) ×3 IMPLANT
GLOVE ORTHO TXT STRL SZ7.5 (GLOVE) ×3 IMPLANT
GOWN BRE IMP SLV AUR XL STRL (GOWN DISPOSABLE) ×9 IMPLANT
GOWN STRL NON-REIN LRG LVL3 (GOWN DISPOSABLE) ×15 IMPLANT
HEMOSTAT POWDER SURGIFOAM 1G (HEMOSTASIS) ×9 IMPLANT
HEMOSTAT SURGICEL 2X14 (HEMOSTASIS) ×3 IMPLANT
INSERT FOGARTY 61MM (MISCELLANEOUS) ×3 IMPLANT
INSERT FOGARTY XLG (MISCELLANEOUS) IMPLANT
KIT BASIN OR (CUSTOM PROCEDURE TRAY) ×3 IMPLANT
KIT PAIN CUSTOM (MISCELLANEOUS) IMPLANT
KIT ROOM TURNOVER OR (KITS) ×3 IMPLANT
KIT SUCTION CATH 14FR (SUCTIONS) ×3 IMPLANT
KIT VASOVIEW W/TROCAR VH 2000 (KITS) ×3 IMPLANT
LINE VENT (MISCELLANEOUS) IMPLANT
MARKER GRAFT CORONARY BYPASS (MISCELLANEOUS) ×9 IMPLANT
NS IRRIG 1000ML POUR BTL (IV SOLUTION) ×18 IMPLANT
PACK OPEN HEART (CUSTOM PROCEDURE TRAY) ×3 IMPLANT
PAD ARMBOARD 7.5X6 YLW CONV (MISCELLANEOUS) ×3 IMPLANT
PEDIATRIC SUCKERS (MISCELLANEOUS) ×3 IMPLANT
PENCIL BUTTON HOLSTER BLD 10FT (ELECTRODE) ×6 IMPLANT
PUNCH AORTIC ROTATE 4.0MM (MISCELLANEOUS) ×3 IMPLANT
PUNCH AORTIC ROTATE 4.5MM 8IN (MISCELLANEOUS) IMPLANT
PUNCH AORTIC ROTATE 5MM 8IN (MISCELLANEOUS) IMPLANT
SET CARDIO DLP MULTI-PER 4-LEG (TRAUMA) IMPLANT
SET CARDIOPLEGIA MPS 5001102 (MISCELLANEOUS) ×3 IMPLANT
SET MULTI PERFUSION 14000 (TRAUMA)
SOLUTION ANTI FOG 6CC (MISCELLANEOUS) ×3 IMPLANT
SPONGE GAUZE 4X4 12PLY (GAUZE/BANDAGES/DRESSINGS) ×6 IMPLANT
SPONGE INTESTINAL PEANUT (DISPOSABLE) ×3 IMPLANT
SPONGE LAP 18X18 X RAY DECT (DISPOSABLE) ×9 IMPLANT
SPONGE LAP 4X18 X RAY DECT (DISPOSABLE) ×6 IMPLANT
SUT BONE WAX W31G (SUTURE) ×3 IMPLANT
SUT MNCRL AB 4-0 PS2 18 (SUTURE) IMPLANT
SUT PROLENE 3 0 SH 1 (SUTURE) IMPLANT
SUT PROLENE 3 0 SH DA (SUTURE) ×6 IMPLANT
SUT PROLENE 3 0 SH1 36 (SUTURE) ×3 IMPLANT
SUT PROLENE 4 0 RB 1 (SUTURE) ×4
SUT PROLENE 4 0 SH DA (SUTURE) ×3 IMPLANT
SUT PROLENE 4-0 RB1 .5 CRCL 36 (SUTURE) ×2 IMPLANT
SUT PROLENE 5 0 C 1 36 (SUTURE) ×3 IMPLANT
SUT PROLENE 6 0 C 1 30 (SUTURE) IMPLANT
SUT PROLENE 6 0 CC (SUTURE) ×6 IMPLANT
SUT PROLENE 7 0 BV 1 (SUTURE) ×3 IMPLANT
SUT PROLENE 7 0 BV1 MDA (SUTURE) ×3 IMPLANT
SUT PROLENE 7 0 DA (SUTURE) IMPLANT
SUT PROLENE 7.0 RB 3 (SUTURE) ×15 IMPLANT
SUT PROLENE 8 0 BV175 6 (SUTURE) IMPLANT
SUT PROLENE BLUE 7 0 (SUTURE) ×9 IMPLANT
SUT PROLENE POLY MONO (SUTURE) ×6 IMPLANT
SUT SILK  1 MH (SUTURE)
SUT SILK 1 MH (SUTURE) IMPLANT
SUT SILK 1 TIES 10X30 (SUTURE) IMPLANT
SUT SILK 2 0 SH CR/8 (SUTURE) ×6 IMPLANT
SUT SILK 3 0 SH CR/8 (SUTURE) ×3 IMPLANT
SUT STEEL 6MS V (SUTURE) ×9 IMPLANT
SUT STEEL STERNAL CCS#1 18IN (SUTURE) ×6 IMPLANT
SUT STEEL SZ 6 DBL 3X14 BALL (SUTURE) ×9 IMPLANT
SUT VIC AB 1 CTX 18 (SUTURE) ×3 IMPLANT
SUT VIC AB 1 CTX 36 (SUTURE) ×4
SUT VIC AB 1 CTX36XBRD ANBCTR (SUTURE) ×2 IMPLANT
SUT VIC AB 2-0 CT1 27 (SUTURE)
SUT VIC AB 2-0 CT1 TAPERPNT 27 (SUTURE) IMPLANT
SUT VIC AB 2-0 CTX 27 (SUTURE) IMPLANT
SUT VIC AB 3-0 SH 27 (SUTURE)
SUT VIC AB 3-0 SH 27X BRD (SUTURE) IMPLANT
SUT VIC AB 3-0 X1 27 (SUTURE) ×3 IMPLANT
SUT VICRYL 4-0 PS2 18IN ABS (SUTURE) IMPLANT
SUTURE E-PAK OPEN HEART (SUTURE) ×3 IMPLANT
SYSTEM SAHARA CHEST DRAIN ATS (WOUND CARE) ×3 IMPLANT
TAPE CLOTH SURG 4X10 WHT LF (GAUZE/BANDAGES/DRESSINGS) ×9 IMPLANT
TOWEL OR 17X24 6PK STRL BLUE (TOWEL DISPOSABLE) ×3 IMPLANT
TOWEL OR 17X26 10 PK STRL BLUE (TOWEL DISPOSABLE) ×3 IMPLANT
TRAY FOLEY IC TEMP SENS 16FR (CATHETERS) ×3 IMPLANT
TUBING INSUFFLATION 10FT LAP (TUBING) ×3 IMPLANT
UNDERPAD 30X30 INCONTINENT (UNDERPADS AND DIAPERS) ×3 IMPLANT
WATER STERILE IRR 1000ML POUR (IV SOLUTION) ×6 IMPLANT

## 2011-01-11 NOTE — Progress Notes (Signed)
ANTICOAGULATION CONSULT NOTE - Follow Up Consult  Pharmacy Consult for Heparin Indication: chest pain/ACS-awaiting CABG   Allergies  Allergen Reactions  . Clopidogrel Bisulfate Hives  . Codeine Nausea And Vomiting  . Morphine Nausea And Vomiting    Patient Measurements: Height: 5\' 5"  (165.1 cm) Weight: 181 lb 14.1 oz (82.5 kg) IBW/kg (Calculated) : 57    Vital Signs: Temp: 97.9 F (36.6 C) (11/15 0333) Temp src: Oral (11/15 0333) BP: 153/70 mmHg (11/15 0333) Pulse Rate: 60  (11/15 0333)  Labs:  Basename 01/11/11 0316 01/10/11 0708 01/09/11 0641  HGB 12.2 12.0 --  HCT 35.3* 36.1 34.3*  PLT 108* 116* 111*  APTT -- -- --  LABPROT 13.6 -- --  INR 1.02 -- --  HEPARINUNFRC 0.52 0.35 0.51  CREATININE 0.66 -- --  CKTOTAL -- -- --  CKMB -- -- --  TROPONINI -- -- --   Estimated Creatinine Clearance: 78.3 ml/min (by C-G formula based on Cr of 0.66).   Medications:  Scheduled:    . aminocaproic acid (AMICAR) for OHS   Intravenous To OR  . aspirin  325 mg Oral Daily  . bisacodyl  5 mg Oral Once  . cefUROXime (ZINACEF) IV  1.5 g Intravenous To OR  . cefUROXime (ZINACEF) IV  750 mg Intravenous To OR  . chlorhexidine  60 mL Topical Once  . chlorhexidine  60 mL Topical Once  . dexmedetomidine (PRECEDEX) IV infusion for high rates  0.1-0.7 mcg/kg/hr Intravenous To OR  . DOPamine  2-20 mcg/kg/min Intravenous To OR  . epinephrine  0.5-20 mcg/min Intravenous To OR  . gabapentin  300 mg Oral TID  . heparin-papaverine-plasmalyte irrigation   Irrigation To OR  . insulin aspart  0-15 Units Subcutaneous TID WC  . insulin aspart  84 Units Subcutaneous TID WC  . insulin glargine  50 Units Subcutaneous BID  . insulin (NOVOLIN-R) infusion   Intravenous To OR  . lisinopril  10 mg Oral Daily  . magnesium sulfate  40 mEq Other To OR  . metFORMIN  500 mg Oral BID WC  . metoprolol  50 mg Oral BID  . metoprolol tartrate  12.5 mg Oral Once  . nitroGLYCERIN  2-200 mcg/min Intravenous To  OR  . PARoxetine  20 mg Oral QAM  . phenylephrine (NEO-SYNEPHRINE) Adult infusion  30-200 mcg/min Intravenous To OR  . potassium chloride  80 mEq Other To OR  . simvastatin  20 mg Oral QHS  . vancomycin  1,250 mg Intravenous To OR    Assessment: 84 YOF with severe 2 vessel CAD awaiting CABG today 11/15 (completing 5 day ticagrelor washout). Heparin level is therapeutic. Hgb and platelets stable - h/o chronic thrombocytopenia  Goal of Therapy:  Heparin level 0.3-0.7 units/ml   Plan:  1. Continue heparin at current rate until stopped for OHS today.    Dannielle Huh 01/11/2011,9:24 AM

## 2011-01-11 NOTE — Brief Op Note (Signed)
01/05/2011 - 01/11/2011  8:14 PM  PATIENT:  [name]  60 y.o. female  PRE-OPERATIVE DIAGNOSIS:  CAD  POST-OPERATIVE DIAGNOSIS:  CAD  PROCEDURE:  Procedure(s): CORONARY ARTERY BYPASS GRAFTING (CABG)  SURGEON:  Surgeon(s): Kathlee Nations Trigt III, MD  PHYSICIAN ASSISTANT: WAYNE Gold pa-c    ANESTHESIA:   Gen  EBL:  Total I/O In: 1500 [I.V.:850; Blood:400; IV Piggyback:250] Out: 200 [Urine:200]  BLOOD ADMINISTERED:1 unit PRBC's  DRAINS: 1 MT 1 PT   LOCAL MEDICATIONS USED: none  SPECIMEN:  none  DISPOSITION OF SPECIMEN:  N/A  COUNTS:  YES  TOURNIQUET:  none  DICTATION: .Dragon Dictation  PLAN OF CARE:   PATIENT DISPOSITION:  To SICU   Delay start of Pharmacological VTE agent (>24hrs

## 2011-01-11 NOTE — Progress Notes (Signed)
SUBJECTIVE:  No chest pain.  No SOB   PHYSICAL EXAM Filed Vitals:   01/10/11 0428 01/10/11 1452 01/10/11 2100 01/11/11 0333  BP: 90/54 149/69 145/60 153/70  Pulse: 62 64 60 60  Temp: 97.6 F (36.4 C) 98.5 F (36.9 C) 98 F (36.7 C) 97.9 F (36.6 C)  TempSrc: Oral Oral Oral Oral  Resp: 18 19 20 18   Height:      Weight:    82.5 kg (181 lb 14.1 oz)  SpO2: 97% 97% 96% 95%   General:  No distress Lungs:  Clear Heart:  RRR, no murmur Abdomen:  Positive bowel sounds, no rebound no guarding Extremities:  No edema  LABS:  Results for orders placed during the hospital encounter of 01/05/11 (from the past 24 hour(s))  GLUCOSE, CAPILLARY     Status: Abnormal   Collection Time   01/10/11 12:06 PM      Component Value Range   Glucose-Capillary 241 (*) 70 - 99 (mg/dL)  GLUCOSE, CAPILLARY     Status: Abnormal   Collection Time   01/10/11  4:00 PM      Component Value Range   Glucose-Capillary 168 (*) 70 - 99 (mg/dL)   Comment 1 Notify RN    GLUCOSE, CAPILLARY     Status: Abnormal   Collection Time   01/10/11  9:12 PM      Component Value Range   Glucose-Capillary 169 (*) 70 - 99 (mg/dL)   Comment 1 Notify RN    CBC     Status: Abnormal   Collection Time   01/11/11  3:16 AM      Component Value Range   WBC 3.8 (*) 4.0 - 10.5 (K/uL)   RBC 3.97  3.87 - 5.11 (MIL/uL)   Hemoglobin 12.2  12.0 - 15.0 (g/dL)   HCT 78.2 (*) 95.6 - 46.0 (%)   MCV 88.9  78.0 - 100.0 (fL)   MCH 30.7  26.0 - 34.0 (pg)   MCHC 34.6  30.0 - 36.0 (g/dL)   RDW 21.3  08.6 - 57.8 (%)   Platelets 108 (*) 150 - 400 (K/uL)  BASIC METABOLIC PANEL     Status: Abnormal   Collection Time   01/11/11  3:16 AM      Component Value Range   Sodium 141  135 - 145 (mEq/L)   Potassium 4.2  3.5 - 5.1 (mEq/L)   Chloride 104  96 - 112 (mEq/L)   CO2 25  19 - 32 (mEq/L)   Glucose, Bld 135 (*) 70 - 99 (mg/dL)   BUN 16  6 - 23 (mg/dL)   Creatinine, Ser 4.69  0.50 - 1.10 (mg/dL)   Calcium 9.3  8.4 - 62.9 (mg/dL)   GFR  calc non Af Amer >90  >90 (mL/min)   GFR calc Af Amer >90  >90 (mL/min)  PROTIME-INR     Status: Normal   Collection Time   01/11/11  3:16 AM      Component Value Range   Prothrombin Time 13.6  11.6 - 15.2 (seconds)   INR 1.02  0.00 - 1.49   HEPARIN LEVEL (UNFRACTIONATED)     Status: Normal   Collection Time   01/11/11  3:16 AM      Component Value Range   Heparin Unfractionated 0.52  0.30 - 0.70 (IU/mL)  GLUCOSE, CAPILLARY     Status: Abnormal   Collection Time   01/11/11  6:20 AM      Component Value Range  Glucose-Capillary 166 (*) 70 - 99 (mg/dL)   Comment 1 Notify RN      Intake/Output Summary (Last 24 hours) at 01/11/11 0806 Last data filed at 01/11/11 0700  Gross per 24 hour  Intake    892 ml  Output      0 ml  Net    892 ml    ASSESSMENT AND PLAN:  Principal Problem: 1)  *Unstable angina:  CABG this afternoon. Active Problems: 2) Type II or unspecified type diabetes mellitus without mention of complication, uncontrolled:  Continue current meds.  HgA1c is elevated and will be further addressed post op and after the hospitalization 3) Essential hypertension, benign:  Continue current therapy.  Somewhat labile yesterday. 4) Thrombocytopenia:  Platelets have been stable (108)) 5) Carotid stenosis:  Follow up as an outpatient    Rollene Rotunda 01/11/2011 8:06 AM

## 2011-01-11 NOTE — OR Nursing (Signed)
SICU 1st call made @ 19:00

## 2011-01-11 NOTE — Op Note (Signed)
OPERATIVE NOTE  PROCEDURE--coronary artery bypass grafting x4 (saphenous vein graft to RCA, saphenous vein graft to distal LAD, saphenous vein graft to obtuse marginal, saphenous vein graft to ramus intermediate)  endoscopic harvest of the bilateral leg greater saphenous vein  SURGEON Kathlee Nations trigt M.D. Asst. Gershon Crane PA-C  ANESTHESIA Gen. by Dr. Alois Cliche  PREOPERATIVE AND POSTOPERATIVE DIAGNOSIS-severe multivessel coronary disease with diabetic pattern status post multiple stents to the LAD, RCA, circumflex, with her aggressive disease and in-stent stenosis, unstable class IV angina  CLINICAL NOTE--the patient is 60 years old with severe diabetes and a long history of coronary disease treated with PCI. She returned with unstable angina and cardiac catheterization to demonstrated severe recurrent disease with preserved LV function. She is felt a candidate for surgical revitalization. She was given a antiplatelet agent at the time of catheterization Murray Hodgkins) which was washed out over 4 days.  Prior to surgery and discuss the results of her cardiac cath of the patient and her husband. I discussed the indications benefits and risks of multivessel bypass grafting for treatment of her severe recurrent coronary disease. She understood that her operation would be at increased risk due to her previous history of rest cancer with radical mastectomy, her history diabetes, and diabetic pattern disease such that we would have to bypass distal to all the stents she understood the risks and agreed to proceed.  PROCEDURE  The patient was brought to operating and placed supine in the operating room table. General anesthesia was induced under invasive hemodynamic monitoring. The chest abdomen and legs were prepped and draped as a sterile field a sternal incision was made as the saphenous vein was harvested endoscopically from both upper legs. The left intramammary artery was harvested but was very scarred  from previous breast surgery and radiation and had been felt to be a suboptimal conduit. The vein was of good quality.  The sternal retractor was placed and the pericardium was opened and suspended. Pursestring place a sending aorta and right atrium. After the vein was harvested and inspected the patient was heparinized and cannulated. Cardio point bypass was initiated the coronaries were identified for grafting. Cardioplegia cannulas were placed and the patient was cooled to 32. Aortic cross-clamp was applied and 800 cc of cold blood cardioplegia was delivered between the aortic root and the coronary sinus. Myocardial temperature dropped to 12.  The distal coronary anastomoses were performed. The first distal anastomosis was to the right coronary artery. It had severe diffuse disease. The anastomosis was placed and a minimally diseased area just distal to the RV marginal bifurcation. Every saphenous vein was sewn with running 7-0 Prolene is good flow through the graft.  The second distal anastomosis was to the cervix marginal. Is a small 1.2 mm vessel as the anastomosis had to be placed distal to the previously inserted stents. Her saphenous vein was sewn end-to-side running 7-0 Prolene with good flow through the graft.  The third distal anastomosis was to the ramus or immediate high in the groove beneath the atrial appendage. It had a proximal 80% stenosis and a reverse saphenous vein was sewn with 7-0 Prolene and is good flow through the graft. Cardioplegia was delivered.  The fourth distal anastomosis was to the distal LAD. It was diffusely diseased. It was somewhat small 1.4 mm diameter. Her her saphenous vein was sewn in side running 7-0 Prolene and there is good flow through the graft.  The proximal anastomoses are performed while cross-clamp was still in  place. Due to the small size of the circumflex vein graft it was sewn end-to-side to the proximal aspect of the LAD vein graft and a vein to  vein fashion. The cross-clamp was removed after air was vented from the coronaries with a dose of retrograde warm blood cardioplegia.  I resumed a spontaneous rhythm the proximal and distals were checked and found to be hemostatic. Air was aspirated from the grafts with a 27-gauge needle and tapered patient wires were applied. The lungs were expanded and the ventilator was resumed. The patient was then weaned off bypass without difficulty on low-dose probing. Hemodynamics were normal a cardiac output of 5 L. Protamine was administered without adverse reaction. The cannulas were removed. The mediastinum was irrigated. The spear pericardial fat was closed. Anterior mediastinal and left pleural chest tube were placed and brought out through separate incisions.  The sternum was closed a steel wire. The pectoral/was closed with a #1 Vicryl. The subcutaneous and skin layers were closed a Vicryl running suture and sterile dressings were applied. Total cardio coronary bypass time was 145 minutes. The patient return to the ICU in stable condition.

## 2011-01-11 NOTE — Anesthesia Procedure Notes (Addendum)
Anesthesia Regional Block: Narrative:       

## 2011-01-11 NOTE — Brief Op Note (Signed)
01/05/2011 - 01/11/2011 01/05/2011 - 01/11/2011  5:55 PM  PATIENT:  [name]  60 y.o. female  PRE-OPERATIVE DIAGNOSIS:  CAD  POST-OPERATIVE DIAGNOSIS same  PROCEDURE:  Procedure(s): CORONARY ARTERY BYPASS GRAFTING (CABG)x4 (SVG-OM; SVG-RCA; SVG-Ramus; SVG-LAD ) EVH Bilat thighs  SURGEON:  Surgeon(s): Mikey Bussing, MD  PHYSICIAN ASSISTANT: Gershon Crane PA-C  ANESTHESIA:   general  PATIENT CONDITION:  ICU - intubated and hemodynamically stable.  PRE-OPERATIVE WEIGHT: 181 lbs  Complications: no known  Gershon Crane PA-C

## 2011-01-11 NOTE — Progress Notes (Signed)
                   [address]            [place] [address]          [phone]        The patient was examined and preop studies reviewed. There has been no change from the prior exam and the patient is ready for surgery.  

## 2011-01-11 NOTE — OR Nursing (Signed)
2nd call made to SICU @19 :50

## 2011-01-11 NOTE — OR Nursing (Signed)
Front desk called @ 6:50pm to notify family off pump.

## 2011-01-11 NOTE — Transfer of Care (Signed)
Immediate Anesthesia Transfer of Care Note  Patient: [name]  Procedure(s) Performed:  CORONARY ARTERY BYPASS GRAFTING (CABG) - cabg x four, using left internal mammary artery and right leg greater saphenous vein harvestede endoscopically  Patient Location: PACU and SICU  Anesthesia Type: General  Level of Consciousness: sedated and unresponsive  Airway & Oxygen Therapy: Patient remains intubated per anesthesia plan  Post-op Assessment: report to 2300 RN   Post vital signs: stable  Complications: No apparent anesthesia complications

## 2011-01-11 NOTE — OR Nursing (Signed)
2nd timeout performed with Dr. Donata Clay, R. Earnest Conroy, CRNA, Bonnita Hollow, RN, Dallas Schimke, RN., Arnetha Courser, RN, and Webb Laws PA at 14:46

## 2011-01-11 NOTE — Anesthesia Preprocedure Evaluation (Addendum)
Anesthesia Evaluation  Patient identified by MRN, date of birth, ID band Patient awake    Reviewed: Allergy & Precautions, H&P , NPO status , Patient's Chart, lab work & pertinent test results  Airway  TM Distance: >3 FB Neck ROM: Full    Dental  (+) Edentulous Upper and Edentulous Lower   Pulmonary shortness of breath,    Pulmonary exam normal       Cardiovascular hypertension, Pt. on medications + angina with exertion + CAD and + Past MI     Neuro/Psych  Neuromuscular disease    GI/Hepatic   Endo/Other  Diabetes mellitus-, Type 2, Insulin Dependent  Renal/GU      Musculoskeletal   Abdominal   Peds  Hematology   Anesthesia Other Findings   Reproductive/Obstetrics                          Anesthesia Physical Anesthesia Plan  ASA: IV  Anesthesia Plan: General   Post-op Pain Management:    Induction: Intravenous  Airway Management Planned: Oral ETT  Additional Equipment:   Intra-op Plan:   Post-operative Plan: Post-operative intubation/ventilation  Informed Consent: I have reviewed the patients History and Physical, chart, labs and discussed the procedure including the risks, benefits and alternatives for the proposed anesthesia with the patient or authorized representative who has indicated his/her understanding and acceptance.   Dental advisory given  Plan Discussed with: CRNA and Surgeon  Anesthesia Plan Comments:         Anesthesia Quick Evaluation

## 2011-01-12 ENCOUNTER — Inpatient Hospital Stay (HOSPITAL_COMMUNITY): Payer: Managed Care, Other (non HMO)

## 2011-01-12 DIAGNOSIS — E1165 Type 2 diabetes mellitus with hyperglycemia: Secondary | ICD-10-CM

## 2011-01-12 DIAGNOSIS — IMO0001 Reserved for inherently not codable concepts without codable children: Secondary | ICD-10-CM

## 2011-01-12 LAB — CBC
Hemoglobin: 8.1 g/dL — ABNORMAL LOW (ref 12.0–15.0)
MCH: 30.5 pg (ref 26.0–34.0)
MCV: 91.4 fL (ref 78.0–100.0)
Platelets: 72 10*3/uL — ABNORMAL LOW (ref 150–400)
Platelets: 122 10*3/uL — ABNORMAL LOW (ref 150–400)
RBC: 2.62 MIL/uL — ABNORMAL LOW (ref 3.87–5.11)
RBC: 2.66 MIL/uL — ABNORMAL LOW (ref 3.87–5.11)
RDW: 13.1 % (ref 11.5–15.5)
WBC: 2.9 10*3/uL — ABNORMAL LOW (ref 4.0–10.5)
WBC: 8.7 10*3/uL (ref 4.0–10.5)

## 2011-01-12 LAB — POCT I-STAT 3, ART BLOOD GAS (G3+)
Bicarbonate: 25.7 mEq/L — ABNORMAL HIGH (ref 20.0–24.0)
Bicarbonate: 27.8 mEq/L — ABNORMAL HIGH (ref 20.0–24.0)
TCO2: 27 mmol/L (ref 0–100)
pCO2 arterial: 49.2 mmHg — ABNORMAL HIGH (ref 35.0–45.0)
pCO2 arterial: 52.5 mmHg — ABNORMAL HIGH (ref 35.0–45.0)
pH, Arterial: 7.327 — ABNORMAL LOW (ref 7.350–7.400)
pO2, Arterial: 95 mmHg (ref 80.0–100.0)

## 2011-01-12 LAB — PREPARE PLATELET PHERESIS: Unit division: 0

## 2011-01-12 LAB — PREPARE FRESH FROZEN PLASMA: Unit division: 0

## 2011-01-12 LAB — POCT I-STAT, CHEM 8
Calcium, Ion: 1.19 mmol/L (ref 1.12–1.32)
Chloride: 101 mEq/L (ref 96–112)
Glucose, Bld: 249 mg/dL — ABNORMAL HIGH (ref 70–99)
HCT: 24 % — ABNORMAL LOW (ref 36.0–46.0)
Hemoglobin: 8.2 g/dL — ABNORMAL LOW (ref 12.0–15.0)
TCO2: 25 mmol/L (ref 0–100)

## 2011-01-12 LAB — CREATININE, SERUM
Creatinine, Ser: 1.01 mg/dL (ref 0.50–1.10)
GFR calc Af Amer: 68 mL/min — ABNORMAL LOW (ref >90)

## 2011-01-12 LAB — GLUCOSE, CAPILLARY
Glucose-Capillary: 113 mg/dL — ABNORMAL HIGH (ref 70–99)
Glucose-Capillary: 116 mg/dL — ABNORMAL HIGH (ref 70–99)
Glucose-Capillary: 120 mg/dL — ABNORMAL HIGH (ref 70–99)
Glucose-Capillary: 126 mg/dL — ABNORMAL HIGH (ref 70–99)
Glucose-Capillary: 129 mg/dL — ABNORMAL HIGH (ref 70–99)
Glucose-Capillary: 137 mg/dL — ABNORMAL HIGH (ref 70–99)
Glucose-Capillary: 141 mg/dL — ABNORMAL HIGH (ref 70–99)
Glucose-Capillary: 142 mg/dL — ABNORMAL HIGH (ref 70–99)
Glucose-Capillary: 144 mg/dL — ABNORMAL HIGH (ref 70–99)
Glucose-Capillary: 156 mg/dL — ABNORMAL HIGH (ref 70–99)
Glucose-Capillary: 171 mg/dL — ABNORMAL HIGH (ref 70–99)
Glucose-Capillary: 223 mg/dL — ABNORMAL HIGH (ref 70–99)
Glucose-Capillary: 188 mg/dL — ABNORMAL HIGH (ref 70–99)
Glucose-Capillary: 257 mg/dL — ABNORMAL HIGH (ref 70–99)
Glucose-Capillary: 118 mg/dL — ABNORMAL HIGH (ref 70–99)
Glucose-Capillary: 131 mg/dL — ABNORMAL HIGH (ref 70–99)

## 2011-01-12 LAB — BASIC METABOLIC PANEL
CO2: 28 mEq/L (ref 19–32)
Calcium: 9.1 mg/dL (ref 8.4–10.5)
Chloride: 109 mEq/L (ref 96–112)
GFR calc Af Amer: >90 mL/min (ref >90)
Sodium: 144 mEq/L (ref 135–145)

## 2011-01-12 LAB — MAGNESIUM: Magnesium: 2.3 mg/dL (ref 1.5–2.5)

## 2011-01-12 MED ORDER — FUROSEMIDE 10 MG/ML IJ SOLN
40.0000 mg | Freq: Two times a day (BID) | INTRAMUSCULAR | Status: DC
  Administered 2011-01-12 – 2011-01-13 (×3): 40 mg via INTRAVENOUS
  Filled 2011-01-12 (×5): qty 4

## 2011-01-12 MED ORDER — INSULIN GLARGINE 100 UNIT/ML ~~LOC~~ SOLN
25.0000 [IU] | SUBCUTANEOUS | Status: DC
  Administered 2011-01-12: 25 [IU] via SUBCUTANEOUS

## 2011-01-12 MED ORDER — SODIUM BICARBONATE 8.4 % IV SOLN
50.0000 meq | Freq: Once | INTRAVENOUS | Status: AC
  Administered 2011-01-12: 50 meq via INTRAVENOUS

## 2011-01-12 MED ORDER — FUROSEMIDE 10 MG/ML IJ SOLN
20.0000 mg | Freq: Four times a day (QID) | INTRAMUSCULAR | Status: DC
  Administered 2011-01-12 (×2): 20 mg via INTRAVENOUS
  Filled 2011-01-12 (×2): qty 2

## 2011-01-12 MED ORDER — DOPAMINE-DEXTROSE 3.2-5 MG/ML-% IV SOLN
2.5000 ug/kg/min | INTRAVENOUS | Status: DC
  Administered 2011-01-12: 2.5 ug/kg/min via INTRAVENOUS
  Filled 2011-01-12: qty 250

## 2011-01-12 MED ORDER — DEXTROSE 5 % IV SOLN
1.5000 g | INTRAVENOUS | Status: DC
  Filled 2011-01-12: qty 1.5

## 2011-01-12 MED ORDER — VANCOMYCIN HCL IN DEXTROSE 1-5 GM/200ML-% IV SOLN
1000.0000 mg | Freq: Two times a day (BID) | INTRAVENOUS | Status: AC
  Administered 2011-01-12 – 2011-01-13 (×3): 1000 mg via INTRAVENOUS
  Filled 2011-01-12 (×3): qty 200

## 2011-01-12 MED ORDER — INSULIN ASPART 100 UNIT/ML ~~LOC~~ SOLN
0.0000 [IU] | SUBCUTANEOUS | Status: DC
  Administered 2011-01-12: 2 [IU] via SUBCUTANEOUS
  Filled 2011-01-12: qty 3

## 2011-01-12 MED ORDER — INSULIN GLARGINE 100 UNIT/ML ~~LOC~~ SOLN
25.0000 [IU] | Freq: Two times a day (BID) | SUBCUTANEOUS | Status: DC
  Administered 2011-01-12 – 2011-01-13 (×2): 25 [IU] via SUBCUTANEOUS
  Filled 2011-01-12: qty 3

## 2011-01-12 MED ORDER — SODIUM CHLORIDE 0.9 % IV SOLN
INTRAVENOUS | Status: DC | PRN
  Filled 2011-01-12: qty 1

## 2011-01-12 NOTE — Progress Notes (Signed)
TCTS BRIEF SICU PROGRESS NOTE  1 Day Post-Op  S/P Procedure(s) (LRB): CORONARY ARTERY BYPASS GRAFTING (CABG) (N/A)   Stable day  Assessment/Plan:  Continue current plan  [name]

## 2011-01-12 NOTE — Progress Notes (Signed)
Inpatient Diabetes Program Recommendations  AACE/ADA: New Consensus Statement on Inpatient Glycemic Control (2009)  Target Ranges:  Prepandial:   less than 140 mg/dL      Peak postprandial:   less than 180 mg/dL (1-2 hours)      Critically ill patients:  140 - 180 mg/dL   Reason for Visit: Post-op blood glucose control.  Inpatient Diabetes Program Recommendations Insulin - IV drip/GlucoStabilizer: Agree with restart on insulin drip.  Consider continuation until 01/13/11. Insulin - Basal: Consider adding at least 1/2 of home dose of Lantus when patient transitioned off insulin drip. Insulin - Meal Coverage: After insulin drip d/c'd, add Novolog 25 units tid with meals (Hold if patient eats less than 50%).  Will likely need titration based on post-prandial blood glucoses. HgbA1C: A1c=8.2%.  Needs to follow-up with her endocrinologist after discharge from hospital.  Note: Spoke to patient and she verified home doses on insulin.  She took Lantus 100 units bid and Humalog 84 units with meals at home.

## 2011-01-12 NOTE — Progress Notes (Signed)
1 Day Post-Op Procedure(s) (LRB): CORONARY ARTERY BYPASS GRAFTING (CABG) (N/A)                      [address]            [name] [address]          [phone]            Subjective:   POD#! CABGx4 alert, some incisional pain  Objective: Vital signs in last 24 hours: Temp:  [97.5 F (36.4 C)-99.3 F (37.4 C)] 99.1 F (37.3 C) (11/16 0745) Pulse Rate:  [62-91] 90  (11/16 0745) Cardiac Rhythm:  [-] Atrial paced (11/16 0745) Resp:  [10-22] 17  (11/16 0745) BP: (93-170)/(52-87) 103/52 mmHg (11/16 0730) SpO2:  [95 %-100 %] 98 % (11/16 0745) FiO2 (%):  [39.7 %-99.7 %] 39.9 % (11/16 0200) Weight:  [189 lb 6 oz (85.9 kg)] 189 lb 6 oz (85.9 kg) (11/16 0600)  Hemodynamic parameters for last 24 hours: PAP: (25-48)/(15-34) 34/19 mmHg CO:  [4.5 L/min-6.1 L/min] 6.1 L/min CI:  [2.5 L/min/m2-3.4 L/min/m2] 3.4 L/min/m2  Intake/Output from previous day: 11/15 0701 - 11/16 0700 In: 5980.8 [I.V.:4230.8; Blood:1020; NG/GT:30; IV Piggyback:700] Out: 3340 [Urine:2700; Emesis/NG output:400; Chest Tube:240] Intake/Output this shift:    EXAM  NSR a-pacing     Lungs clear     Exrem warm  Lab Results:  Basename 01/12/11 0409 01/11/11 2030  WBC 2.9* 3.0*  HGB 8.1* 8.1*  HCT 23.6* 23.4*  PLT 72* 83*   BMET:  Basename 01/12/11 0409 01/11/11 2029 01/11/11 0316  NA 144 144 --  K 4.3 3.8 --  CL 109 -- 104  CO2 28 -- 25  GLUCOSE 123* 180* --  BUN 14 -- 16  CREATININE 0.71 -- 0.66  CALCIUM 9.1 -- 9.3    PT/INR:  Basename 01/11/11 2030  LABPROT 17.5*  INR 1.41   ABG    Component Value Date/Time   PHART 7.332* 01/12/2011 0414   HCO3 27.8* 01/12/2011 0414   TCO2 29 01/12/2011 0414   ACIDBASEDEF 2.0 01/11/2011 2029   O2SAT 97.0 01/12/2011 0414   CBG (last 3)   Basename 01/11/11 1306 01/11/11 1153 01/11/11 0620  GLUCAP 160* 156* 166*    Assessment/Plan: S/P Procedure(s) (LRB): CORONARY ARTERY BYPASS GRAFTING (CABG) (N/A) DM control, add  LANTUS Diuresis D/C lines,OOB   LOS: 7 days    [name],PETER 01/12/2011

## 2011-01-12 NOTE — Anesthesia Postprocedure Evaluation (Signed)
  Anesthesia Post-op Note  Patient: [name]  Procedure(s) Performed:  CORONARY ARTERY BYPASS GRAFTING (CABG) - cabg x four, using left internal mammary artery and right leg greater saphenous vein harvestede endoscopically  Patient Location: PACU and ICU  Anesthesia Type: General  Level of Consciousness: sedated  Airway and Oxygen Therapy: Patient remains intubated per anesthesia plan  Post-op Pain: none  Post-op Assessment: Post-op Vital signs reviewed, Patient's Cardiovascular Status Stable, Respiratory Function Stable, Patent Airway, No signs of Nausea or vomiting and Pain level controlled  Post-op Vital Signs: Reviewed and stable  Complications: No apparent anesthesia complications

## 2011-01-12 NOTE — Procedures (Signed)
Extubation Procedure Note  Patient Details:   Name: SANIYYAH ELSTER DOB: 12/29/1950 MRN: [id number]   Airway Documentation:  AIRWAYS 8 mm (Active)     Airway 8 mm (Active)  Secured at (cm) 22 cm 01/11/2011  8:32 PM  Measured From Lips 01/11/2011  8:32 PM  Secured By Caron Presume Tape 01/11/2011  8:32 PM    Evaluation  O2 sats: stable throughout Complications: No apparent complications Patient did tolerate procedure well. Bilateral Breath Sounds: Clear;Diminished                             Yes               NIF -24/ FVC 600cc.Pt placed on 4lpm House.          Charletta Cousin Evette 01/12/2011, 2:14 AM

## 2011-01-13 ENCOUNTER — Inpatient Hospital Stay (HOSPITAL_COMMUNITY): Payer: Managed Care, Other (non HMO)

## 2011-01-13 LAB — TYPE AND SCREEN
ABO/RH(D): B POS
Antibody Screen: NEGATIVE
Unit division: 0
Unit division: 0

## 2011-01-13 LAB — CBC
HCT: 24.3 % — ABNORMAL LOW (ref 36.0–46.0)
Hemoglobin: 8.2 g/dL — ABNORMAL LOW (ref 12.0–15.0)
MCH: 30.9 pg (ref 26.0–34.0)
MCHC: 33.7 g/dL (ref 30.0–36.0)
MCV: 91.7 fL (ref 78.0–100.0)
Platelets: 98 10*3/uL — ABNORMAL LOW (ref 150–400)
RBC: 2.65 MIL/uL — ABNORMAL LOW (ref 3.87–5.11)
RDW: 13.9 % (ref 11.5–15.5)
WBC: 9.9 10*3/uL (ref 4.0–10.5)

## 2011-01-13 LAB — GLUCOSE, CAPILLARY
Glucose-Capillary: 109 mg/dL — ABNORMAL HIGH (ref 70–99)
Glucose-Capillary: 109 mg/dL — ABNORMAL HIGH (ref 70–99)
Glucose-Capillary: 110 mg/dL — ABNORMAL HIGH (ref 70–99)
Glucose-Capillary: 112 mg/dL — ABNORMAL HIGH (ref 70–99)
Glucose-Capillary: 112 mg/dL — ABNORMAL HIGH (ref 70–99)
Glucose-Capillary: 114 mg/dL — ABNORMAL HIGH (ref 70–99)
Glucose-Capillary: 122 mg/dL — ABNORMAL HIGH (ref 70–99)
Glucose-Capillary: 124 mg/dL — ABNORMAL HIGH (ref 70–99)
Glucose-Capillary: 136 mg/dL — ABNORMAL HIGH (ref 70–99)
Glucose-Capillary: 142 mg/dL — ABNORMAL HIGH (ref 70–99)
Glucose-Capillary: 96 mg/dL (ref 70–99)

## 2011-01-13 LAB — URINALYSIS, ROUTINE W REFLEX MICROSCOPIC
Ketones, ur: NEGATIVE mg/dL
Leukocytes, UA: NEGATIVE
Nitrite: NEGATIVE
Specific Gravity, Urine: 1.014 (ref 1.005–1.030)
pH: 5.5 (ref 5.0–8.0)

## 2011-01-13 LAB — BASIC METABOLIC PANEL
BUN: 23 mg/dL (ref 6–23)
CO2: 27 mEq/L (ref 19–32)
Calcium: 8.8 mg/dL (ref 8.4–10.5)
Chloride: 100 mEq/L (ref 96–112)
Creatinine, Ser: 0.91 mg/dL (ref 0.50–1.10)
GFR calc Af Amer: 77 mL/min — ABNORMAL LOW (ref >90)
GFR calc non Af Amer: 67 mL/min — ABNORMAL LOW (ref >90)
Glucose, Bld: 135 mg/dL — ABNORMAL HIGH (ref 70–99)
Potassium: 3.8 mEq/L (ref 3.5–5.1)
Sodium: 136 mEq/L (ref 135–145)

## 2011-01-13 MED ORDER — POTASSIUM CHLORIDE 10 MEQ PO TBCR
20.0000 meq | EXTENDED_RELEASE_TABLET | Freq: Two times a day (BID) | ORAL | Status: DC
  Administered 2011-01-13 (×2): 20 meq via ORAL
  Filled 2011-01-13 (×6): qty 2

## 2011-01-13 MED ORDER — INSULIN GLARGINE 100 UNIT/ML ~~LOC~~ SOLN
75.0000 [IU] | Freq: Two times a day (BID) | SUBCUTANEOUS | Status: DC
  Administered 2011-01-13 – 2011-01-16 (×7): 75 [IU] via SUBCUTANEOUS
  Filled 2011-01-13: qty 3

## 2011-01-13 MED ORDER — INSULIN GLARGINE 100 UNIT/ML ~~LOC~~ SOLN: 50.0000 [IU] | Freq: Once | SUBCUTANEOUS | Status: DC

## 2011-01-13 NOTE — Progress Notes (Signed)
   CARDIOTHORACIC SURGERY PROGRESS NOTE   R2 Days Post-Op Procedure(s) (LRB): CORONARY ARTERY BYPASS GRAFTING (CABG) (N/A)  Subjective: Feels a little better than yesterday. Still quite sore and weak. Marginal appetite.  Objective: Vital signs: Filed Vitals:   01/13/11 1000  BP: 129/44  Pulse: 80  Temp:   Resp: 27    Hemodynamics:    Physical Exam:  Rhythm:   sinus  Breath sounds: Clear but shallow  Heart sounds:  RRR  Incisions:  dry  Abdomen:  soft  Extremities:  warm   Intake/Output from previous day: 11/16 0701 - 11/17 0700 In: 2723.3 [P.O.:1400; I.V.:765.3; IV Piggyback:558] Out: 1130 [Urine:1080; Chest Tube:50] Intake/Output this shift: Total I/O In: 248.6 [P.O.:100; I.V.:94.6; IV Piggyback:54] Out: 155 [Urine:155]  Lab Results:  Basename 01/13/11 0400 01/12/11 1700  WBC 9.9 8.7  HGB 8.2* 8.1*  HCT 24.3* 24.3*  PLT 98* 122*   BMET:  Basename 01/13/11 0400 01/12/11 1700 01/12/11 1659 01/12/11 0409  NA 136 -- 138 --  K 3.8 -- 4.1 --  CL 100 -- 101 --  CO2 27 -- -- 28  GLUCOSE 135* -- 249* --  BUN 23 -- 22 --  CREATININE 0.91 1.01 -- --  CALCIUM 8.8 -- -- 9.1    CBG (last 3)   Basename 01/13/11 1125 01/13/11 1017 01/13/11 0912  GLUCAP 119* 110* 119*   ABG    Component Value Date/Time   PHART 7.332* 01/12/2011 0414   HCO3 27.8* 01/12/2011 0414   TCO2 25 01/12/2011 1659   ACIDBASEDEF 2.0 01/11/2011 2029   O2SAT 97.0 01/12/2011 0414     Assessment/Plan: S/P Procedure(s) (LRB): CORONARY ARTERY BYPASS GRAFTING (CABG) (N/A) Stable POD2 Mobilize Wean dopamine Continue diuresis Insulin drip > 8units/hr Increase lantus insulin for DMII  [name],[name]

## 2011-01-13 NOTE — Plan of Care (Signed)
Problem: Phase III Progression Outcomes Goal: Ambulates with pain/dyspnea controlled Outcome: Progressing Ambulating with more steady gait.

## 2011-01-14 ENCOUNTER — Inpatient Hospital Stay (HOSPITAL_COMMUNITY): Payer: Managed Care, Other (non HMO)

## 2011-01-14 LAB — CBC
MCH: 31.2 pg (ref 26.0–34.0)
MCHC: 33.8 g/dL (ref 30.0–36.0)
Platelets: 92 10*3/uL — ABNORMAL LOW (ref 150–400)
RBC: 2.31 MIL/uL — ABNORMAL LOW (ref 3.87–5.11)

## 2011-01-14 LAB — GLUCOSE, CAPILLARY
Glucose-Capillary: 82 mg/dL (ref 70–99)
Glucose-Capillary: 89 mg/dL (ref 70–99)
Glucose-Capillary: 88 mg/dL (ref 70–99)
Glucose-Capillary: 183 mg/dL — ABNORMAL HIGH (ref 70–99)
Glucose-Capillary: 162 mg/dL — ABNORMAL HIGH (ref 70–99)
Glucose-Capillary: 177 mg/dL — ABNORMAL HIGH (ref 70–99)
Glucose-Capillary: 146 mg/dL — ABNORMAL HIGH (ref 70–99)
Glucose-Capillary: 111 mg/dL — ABNORMAL HIGH (ref 70–99)
Glucose-Capillary: 112 mg/dL — ABNORMAL HIGH (ref 70–99)
Glucose-Capillary: 106 mg/dL — ABNORMAL HIGH (ref 70–99)

## 2011-01-14 LAB — POCT I-STAT 3, ART BLOOD GAS (G3+)
Bicarbonate: 24.3 mEq/L — ABNORMAL HIGH (ref 20.0–24.0)
Patient temperature: 99.6
pH, Arterial: 7.378 (ref 7.350–7.400)

## 2011-01-14 LAB — BASIC METABOLIC PANEL
Calcium: 8.1 mg/dL — ABNORMAL LOW (ref 8.4–10.5)
GFR calc non Af Amer: 43 mL/min — ABNORMAL LOW (ref >90)
Glucose, Bld: 105 mg/dL — ABNORMAL HIGH (ref 70–99)
Sodium: 131 mEq/L — ABNORMAL LOW (ref 135–145)

## 2011-01-14 MED ORDER — FENTANYL CITRATE 0.05 MG/ML IJ SOLN
INTRAMUSCULAR | Status: AC
  Filled 2011-01-14: qty 2

## 2011-01-14 MED ORDER — PHENYLEPHRINE HCL 10 MG/ML IJ SOLN
30.0000 ug/min | INTRAVENOUS | Status: DC
  Administered 2011-01-14: 5 ug/min via INTRAVENOUS
  Administered 2011-01-14: 40 ug/min via INTRAVENOUS
  Administered 2011-01-15 (×2): 30 ug/min via INTRAVENOUS
  Filled 2011-01-14 (×4): qty 2

## 2011-01-14 MED ORDER — DILTIAZEM HCL 100 MG IV SOLR
INTRAVENOUS | Status: AC
  Filled 2011-01-14: qty 100

## 2011-01-14 MED ORDER — DEXTROSE 5 % IV SOLN
30.0000 mg/h | INTRAVENOUS | Status: AC
  Administered 2011-01-14: 59.94 mg/h via INTRAVENOUS
  Filled 2011-01-14 (×2): qty 9

## 2011-01-14 MED ORDER — SODIUM CHLORIDE 0.9 % IV SOLN
INTRAVENOUS | Status: DC | PRN
  Filled 2011-01-14 (×2): qty 1

## 2011-01-14 MED ORDER — DEXTROSE 5 % IV SOLN
60.0000 mg/h | INTRAVENOUS | Status: AC
  Administered 2011-01-14 (×2): 60 mg/h via INTRAVENOUS
  Filled 2011-01-14 (×2): qty 9

## 2011-01-14 MED ORDER — DILTIAZEM HCL 100 MG IV SOLR
5.0000 mg/h | INTRAVENOUS | Status: DC
  Filled 2011-01-14: qty 100

## 2011-01-14 MED ORDER — INSULIN ASPART 100 UNIT/ML ~~LOC~~ SOLN: 0.0000 [IU] | SUBCUTANEOUS | Status: DC

## 2011-01-14 MED ORDER — MIDAZOLAM HCL 2 MG/2ML IJ SOLN
INTRAMUSCULAR | Status: AC
  Administered 2011-01-14: 2 mg via INTRAVENOUS
  Filled 2011-01-14: qty 2

## 2011-01-14 MED ORDER — DILTIAZEM LOAD VIA INFUSION
10.0000 mg | Freq: Once | INTRAVENOUS | Status: DC
  Filled 2011-01-14: qty 10

## 2011-01-14 MED ORDER — SODIUM BICARBONATE 8.4 % IV SOLN
INTRAVENOUS | Status: AC
  Administered 2011-01-14: 14:00:00
  Filled 2011-01-14: qty 50

## 2011-01-14 MED ORDER — AMIODARONE LOAD VIA INFUSION
150.0000 mg | Freq: Once | INTRAVENOUS | Status: AC
  Administered 2011-01-14: 150 mg via INTRAVENOUS
  Filled 2011-01-14: qty 151.2

## 2011-01-14 MED ORDER — MIDAZOLAM HCL 2 MG/2ML IJ SOLN
INTRAMUSCULAR | Status: AC
  Administered 2011-01-14: 2 mg via INTRAVENOUS
  Filled 2011-01-14: qty 4

## 2011-01-14 MED ORDER — ALBUMIN HUMAN 5 % IV SOLN
12.5000 g | Freq: Once | INTRAVENOUS | Status: AC
  Administered 2011-01-14: 12.5 g via INTRAVENOUS

## 2011-01-14 MED ORDER — ENOXAPARIN SODIUM 40 MG/0.4ML ~~LOC~~ SOLN
40.0000 mg | SUBCUTANEOUS | Status: DC
  Administered 2011-01-14: 40 mg via SUBCUTANEOUS
  Filled 2011-01-14 (×3): qty 0.4

## 2011-01-14 MED ORDER — PANTOPRAZOLE SODIUM 40 MG IV SOLR
40.0000 mg | INTRAVENOUS | Status: DC
  Filled 2011-01-14: qty 40

## 2011-01-14 MED ORDER — NOREPINEPHRINE BITARTRATE 1 MG/ML IJ SOLN
2.0000 ug/min | INTRAVENOUS | Status: DC
  Filled 2011-01-14: qty 8

## 2011-01-14 MED ORDER — FUROSEMIDE 10 MG/ML IJ SOLN
40.0000 mg | Freq: Once | INTRAMUSCULAR | Status: AC
  Administered 2011-01-14: 40 mg via INTRAVENOUS
  Filled 2011-01-14: qty 4

## 2011-01-14 MED ORDER — METOPROLOL TARTRATE 1 MG/ML IV SOLN
INTRAVENOUS | Status: AC
  Administered 2011-01-14: 5 mg via INTRAVENOUS
  Filled 2011-01-14: qty 5

## 2011-01-14 MED ORDER — DEXTROSE 5 % IV SOLN
150.0000 mg | Freq: Once | INTRAVENOUS | Status: AC
  Administered 2011-01-14: 150 mg via INTRAVENOUS
  Filled 2011-01-14: qty 3

## 2011-01-14 NOTE — Progress Notes (Signed)
TCTS BRIEF SICU PROGRESS NOTE  3 Days Post-Op  S/P Procedure(s) (LRB): CORONARY ARTERY BYPASS GRAFTING (CABG) (N/A)   Just waking up.  Follows commands on vent.  Comfortable. Maintaining A-paced rhythm.  BP 94/50 on Neo drip Breath sounds clear. Extremities warm, well-perfused UOP adequate 1 of 2 units PRBC's has been given so far.  Assessment/Plan:  Keep on vent overnight.  Hold lasix until 2nd unit PRBC's given and BP increased.  [name] H

## 2011-01-14 NOTE — Procedures (Signed)
Arterial Catheter Insertion Procedure Note [name] [id number] 03/11/1950  Procedure: Insertion of Arterial Catheter  Indications: Blood pressure monitoring  Procedure Details Consent: Unable to obtain consent because of emergent medical necessity. Time Out: Verified patient identification, verified procedure, site/side was marked, verified correct patient position, special equipment/implants available, medications/allergies/relevent history reviewed, required imaging and test results available.  Performed  Maximum sterile technique was used including antiseptics. Skin prep: Chlorhexidine; local anesthetic administered 20 gauge catheter was inserted into right radial artery using the Seldinger technique.  Evaluation Blood flow good; BP tracing good. Complications: No apparent complications.   [facility] 01/14/2011

## 2011-01-14 NOTE — Procedures (Signed)
Intubation Procedure Note BEDIE DOMINEY [id number] 1950/12/01  Procedure: Intubation Indications: Respiratory insufficiency  Procedure Details Consent: Unable to obtain consent because of emergent medical necessity. Time Out: Verified patient identification, verified procedure, site/side was marked, verified correct patient position, special equipment/implants available, medications/allergies/relevent history reviewed, required imaging and test results available.  Performed  Maximum sterile technique was used including gloves and hand hygiene.  MAC and 4    Evaluation Hemodynamic Status: BP stable throughout; O2 sats: stable throughout Patient's Current Condition: unstable Complications: No apparent complications Patient did tolerate procedure well. Chest X-ray ordered to verify placement.  CXR: pending.  Direct visualization of vocal cords with a 4 mac blade.  ETT passed through vocal cords with no difficulty after one attempt.  End tidal CO2 positive for color change.  BBS equal.  ETT secured per policy. Lysbeth Penner [facility] 01/14/2011

## 2011-01-14 NOTE — Progress Notes (Signed)
   CARDIOTHORACIC SURGERY PROGRESS NOTE   R3 Days Post-Op Procedure(s) (LRB): CORONARY ARTERY BYPASS GRAFTING (CABG) (N/A)  Subjective: Sedated on vent  Objective: Vital signs: Filed Vitals:   01/14/11 1345  BP:   Pulse:   Temp: 99.4 F (37.4 C)  Resp:     Hemodynamics:    Physical Exam:  Rhythm:   A-paced  Breath sounds: clear  Heart sounds:  RRR  Incisions:  dry  Abdomen:  soft  Extremities:  warm   Intake/Output from previous day: 11/17 0701 - 11/18 0700 In: 1505.5 [P.O.:660; I.V.:587.5; IV Piggyback:258] Out: 630 [Urine:630] Intake/Output this shift: Total I/O In: 243.6 [P.O.:150; I.V.:93.6] Out: 85 [Urine:85]  Lab Results:  Basename 01/14/11 0455 01/13/11 0400  WBC 8.6 9.9  HGB 7.2* 8.2*  HCT 21.3* 24.3*  PLT 92* 98*   BMET:  Basename 01/14/11 0455 01/13/11 0400  NA 131* 136  K 4.4 3.8  CL 97 100  CO2 25 27  GLUCOSE 105* 135*  BUN 36* 23  CREATININE 1.32* 0.91  CALCIUM 8.1* 8.8    CBG (last 3)   Basename 01/14/11 1313 01/14/11 1153 01/14/11 1001  GLUCAP 183* 161* 98   ABG    Component Value Date/Time   PHART 7.332* 01/12/2011 0414   HCO3 27.8* 01/12/2011 0414   TCO2 25 01/12/2011 1659   ACIDBASEDEF 2.0 01/11/2011 2029   O2SAT 97.0 01/12/2011 0414     Assessment/Plan: S/P Procedure(s) (LRB): CORONARY ARTERY BYPASS GRAFTING (CABG) (N/A)  SVT/rapid AF associated with hypotension, cardiogenic shock Now stable in A-paced rhythm s/p DCCV and reintubation Acute blood loss anemia worse - will transfuse x 2 units PRBCs Continue Amiodarone drip   [name] H

## 2011-01-14 NOTE — Progress Notes (Signed)
TCTS BRIEF SICU EVENT NOTE  3 Days Post-Op  S/P Procedure(s) (LRB): CORONARY ARTERY BYPASS GRAFTING (CABG) (N/A)   [name] went into SVT/rapid AF associated with hypotension, SBP <70 Initial brief attempt to manage with 100% O2 by bag mask, IV volume, carotid massage, and IV lopressor x5 mg without effect.  She was subsequently cardioverted a total of 4 times (100J x1, 200J x3) also without result, despite IV Amiodarone x 150 mg bolus between the 2nd and 3rd cardioversion and a second bolus IV Amiodarone x 150 mg.  IV diltiazem (10 mg x1 then 10 mg/hr) started and patient intubated due to progressive lethargy for airway control.  She converted to sinus bradycardia and A-pacing initiated. Family at bedside, updated.  A total of 500 mL IV albumin administered during the event for volume resuscitation.  [name]

## 2011-01-15 ENCOUNTER — Other Ambulatory Visit: Payer: Self-pay

## 2011-01-15 ENCOUNTER — Inpatient Hospital Stay (HOSPITAL_COMMUNITY): Payer: Managed Care, Other (non HMO)

## 2011-01-15 DIAGNOSIS — IMO0001 Reserved for inherently not codable concepts without codable children: Secondary | ICD-10-CM

## 2011-01-15 DIAGNOSIS — I517 Cardiomegaly: Secondary | ICD-10-CM

## 2011-01-15 DIAGNOSIS — I4891 Unspecified atrial fibrillation: Secondary | ICD-10-CM | POA: Diagnosis not present

## 2011-01-15 DIAGNOSIS — E1165 Type 2 diabetes mellitus with hyperglycemia: Secondary | ICD-10-CM

## 2011-01-15 LAB — CARDIAC PANEL(CRET KIN+CKTOT+MB+TROPI)
CK, MB: 8.3 ng/mL (ref 0.3–4.0)
CK, MB: 7.3 ng/mL (ref 0.3–4.0)
Relative Index: 1.5 (ref 0.0–2.5)
Relative Index: 1.3 (ref 0.0–2.5)
Total CK: 568 U/L — ABNORMAL HIGH (ref 7–177)
Total CK: 559 U/L — ABNORMAL HIGH (ref 7–177)
Troponin I: 10.54 ng/mL (ref <0.30)
Troponin I: 8.88 ng/mL (ref <0.30)

## 2011-01-15 LAB — POCT I-STAT 3, ART BLOOD GAS (G3+)
Bicarbonate: 20.3 mEq/L (ref 20.0–24.0)
Bicarbonate: 26.1 mEq/L — ABNORMAL HIGH (ref 20.0–24.0)
O2 Saturation: 100 %
O2 Saturation: 97 %
O2 Saturation: 95 %
O2 Saturation: 96 %
TCO2: 27 mmol/L (ref 0–100)
pCO2 arterial: 45.7 mmHg — ABNORMAL HIGH (ref 35.0–45.0)
pCO2 arterial: 40.2 mmHg (ref 35.0–45.0)
pCO2 arterial: 43 mmHg (ref 35.0–45.0)
pCO2 arterial: 44.3 mmHg (ref 35.0–45.0)
pH, Arterial: 7.38 (ref 7.350–7.400)
pO2, Arterial: 238 mmHg — ABNORMAL HIGH (ref 80.0–100.0)
pO2, Arterial: 91 mmHg (ref 80.0–100.0)
pO2, Arterial: 75 mmHg — ABNORMAL LOW (ref 80.0–100.0)

## 2011-01-15 LAB — CBC
HCT: 25.5 % — ABNORMAL LOW (ref 36.0–46.0)
HCT: 25.7 % — ABNORMAL LOW (ref 36.0–46.0)
Hemoglobin: 8.9 g/dL — ABNORMAL LOW (ref 12.0–15.0)
MCH: 31 pg (ref 26.0–34.0)
MCH: 31.1 pg (ref 26.0–34.0)
MCHC: 34.9 g/dL (ref 30.0–36.0)
MCHC: 34.6 g/dL (ref 30.0–36.0)
MCV: 88.9 fL (ref 78.0–100.0)
MCV: 89.9 fL (ref 78.0–100.0)
Platelets: 148 10*3/uL — ABNORMAL LOW (ref 150–400)
Platelets: 112 10*3/uL — ABNORMAL LOW (ref 150–400)
RBC: 2.86 MIL/uL — ABNORMAL LOW (ref 3.87–5.11)
RDW: 14.8 % (ref 11.5–15.5)
RDW: 14.6 % (ref 11.5–15.5)
WBC: 7.3 10*3/uL (ref 4.0–10.5)

## 2011-01-15 LAB — GLUCOSE, CAPILLARY
Glucose-Capillary: 102 mg/dL — ABNORMAL HIGH (ref 70–99)
Glucose-Capillary: 112 mg/dL — ABNORMAL HIGH (ref 70–99)
Glucose-Capillary: 113 mg/dL — ABNORMAL HIGH (ref 70–99)
Glucose-Capillary: 122 mg/dL — ABNORMAL HIGH (ref 70–99)
Glucose-Capillary: 146 mg/dL — ABNORMAL HIGH (ref 70–99)
Glucose-Capillary: 75 mg/dL (ref 70–99)
Glucose-Capillary: 93 mg/dL (ref 70–99)
Glucose-Capillary: 97 mg/dL (ref 70–99)
Glucose-Capillary: 99 mg/dL (ref 70–99)

## 2011-01-15 LAB — CARBOXYHEMOGLOBIN
Carboxyhemoglobin: 1.6 % — ABNORMAL HIGH (ref 0.5–1.5)
Methemoglobin: 0.8 % (ref 0.0–1.5)
O2 Saturation: 67.7 %
Total hemoglobin: 7.7 g/dL — ABNORMAL LOW (ref 12.5–16.0)

## 2011-01-15 LAB — BASIC METABOLIC PANEL
BUN: 52 mg/dL — ABNORMAL HIGH (ref 6–23)
BUN: 50 mg/dL — ABNORMAL HIGH (ref 6–23)
CO2: 23 mEq/L (ref 19–32)
CO2: 25 mEq/L (ref 19–32)
Calcium: 7.9 mg/dL — ABNORMAL LOW (ref 8.4–10.5)
Calcium: 8.1 mg/dL — ABNORMAL LOW (ref 8.4–10.5)
Chloride: 96 mEq/L (ref 96–112)
Chloride: 96 mEq/L (ref 96–112)
Creatinine, Ser: 1.42 mg/dL — ABNORMAL HIGH (ref 0.50–1.10)
Creatinine, Ser: 1.05 mg/dL (ref 0.50–1.10)
GFR calc Af Amer: 65 mL/min — ABNORMAL LOW (ref >90)
GFR calc non Af Amer: 56 mL/min — ABNORMAL LOW (ref >90)
Glucose, Bld: 96 mg/dL (ref 70–99)
Glucose, Bld: 161 mg/dL — ABNORMAL HIGH (ref 70–99)
Potassium: 4.2 mEq/L (ref 3.5–5.1)
Sodium: 132 mEq/L — ABNORMAL LOW (ref 135–145)

## 2011-01-15 LAB — EXPECTORATED SPUTUM ASSESSMENT W GRAM STAIN, RFLX TO RESP C

## 2011-01-15 MED ORDER — DILTIAZEM HCL 100 MG IV SOLR
INTRAVENOUS | Status: AC
  Administered 2011-01-15: 100 mg via INTRAVENOUS
  Filled 2011-01-15: qty 100

## 2011-01-15 MED ORDER — DILTIAZEM HCL 100 MG IV SOLR
10.0000 mg/h | INTRAVENOUS | Status: AC
  Administered 2011-01-15: 10 mg/h via INTRAVENOUS
  Administered 2011-01-15: 5 mg/h via INTRAVENOUS
  Filled 2011-01-15: qty 100

## 2011-01-15 MED ORDER — FUROSEMIDE 10 MG/ML IJ SOLN
40.0000 mg | Freq: Two times a day (BID) | INTRAMUSCULAR | Status: DC
  Administered 2011-01-15 – 2011-01-17 (×5): 40 mg via INTRAVENOUS
  Filled 2011-01-15 (×12): qty 4

## 2011-01-15 MED ORDER — LIDOCAINE HCL (CARDIAC) 20 MG/ML IV SOLN
100.0000 mg | Freq: Once | INTRAVENOUS | Status: AC
  Administered 2011-01-15: 100 mg via INTRAVENOUS

## 2011-01-15 MED ORDER — DOPAMINE-DEXTROSE 3.2-5 MG/ML-% IV SOLN
2.5000 ug/kg/min | INTRAVENOUS | Status: DC
  Administered 2011-01-15: 2.5 ug/kg/min via INTRAVENOUS
  Filled 2011-01-15: qty 250

## 2011-01-15 MED ORDER — CEFTAZIDIME 1 G IJ SOLR: 1.0000 g | Freq: Three times a day (TID) | INTRAMUSCULAR | Status: DC

## 2011-01-15 MED ORDER — DEXTROSE 5 % IV SOLN
30.0000 mg/h | INTRAVENOUS | Status: AC
  Administered 2011-01-15: 150 mg/h via INTRAVENOUS
  Administered 2011-01-16 (×2): 30 mg/h via INTRAVENOUS
  Filled 2011-01-15 (×4): qty 9

## 2011-01-15 MED ORDER — FUROSEMIDE 10 MG/ML IJ SOLN
INTRAMUSCULAR | Status: AC
  Administered 2011-01-15: 40 mg
  Filled 2011-01-15: qty 4

## 2011-01-15 MED ORDER — DEXTROSE 5 % IV SOLN
150.0000 mg | Freq: Once | INTRAVENOUS | Status: DC
  Filled 2011-01-15: qty 3

## 2011-01-15 MED ORDER — ENOXAPARIN SODIUM 30 MG/0.3ML ~~LOC~~ SOLN
30.0000 mg | SUBCUTANEOUS | Status: DC
  Administered 2011-01-15 – 2011-01-24 (×10): 30 mg via SUBCUTANEOUS
  Filled 2011-01-15 (×11): qty 0.3

## 2011-01-15 MED ORDER — SODIUM CHLORIDE 0.9 % IJ SOLN
10.0000 mL | INTRAMUSCULAR | Status: DC | PRN
  Administered 2011-01-19 – 2011-01-22 (×13): 10 mL

## 2011-01-15 MED ORDER — DEXTROSE 5 % IV SOLN
1.0000 g | Freq: Three times a day (TID) | INTRAVENOUS | Status: DC
  Administered 2011-01-15 – 2011-01-20 (×15): 1 g via INTRAVENOUS
  Filled 2011-01-15 (×18): qty 1

## 2011-01-15 MED ORDER — CHLORHEXIDINE GLUCONATE 0.12 % MT SOLN
OROMUCOSAL | Status: AC
  Administered 2011-01-15: 15 mL
  Filled 2011-01-15: qty 15

## 2011-01-15 NOTE — Progress Notes (Signed)
Extubation note 0930 Pt extubated per MD order. Pt awake and alert. Placed on 3L [place], sat 96%. Positive cuff leak, NIF-25, VC 700, BBS coasre. Pt developed Vtach immedately following extubation. Dr Morton Peters called in to room by RN. No other complications noted. Pt able to volcalize.

## 2011-01-15 NOTE — Progress Notes (Signed)
SUBJECTIVE:  Events noted.  Now in AV paced rhythm with underlying NSR.     PHYSICAL EXAM Filed Vitals:   01/15/11 1745 01/15/11 1800 01/15/11 1815 01/15/11 1830  BP:      Pulse: 79 79 80 80  Temp:      TempSrc:      Resp: 21 24 25  32  Height:      Weight:      SpO2: 93% 94% 92% 95%   General:  Weak, no distress Lungs:  Clear Heart:  RRR, no rub Abdomen:  Decreased bowel sounds Extremities:  Diffuse edema  LABS: Lab Results  Component Value Date   CKTOTAL 559* 01/15/2011   CKMB 7.3* 01/15/2011   TROPONINI 8.88* 01/15/2011   Results for orders placed during the hospital encounter of 01/05/11 (from the past 24 hour(s))  GLUCOSE, CAPILLARY     Status: Abnormal   Collection Time   01/14/11  7:44 PM      Component Value Range   Glucose-Capillary 111 (*) 70 - 99 (mg/dL)  GLUCOSE, CAPILLARY     Status: Abnormal   Collection Time   01/14/11  9:07 PM      Component Value Range   Glucose-Capillary 112 (*) 70 - 99 (mg/dL)  GLUCOSE, CAPILLARY     Status: Abnormal   Collection Time   01/14/11 10:13 PM      Component Value Range   Glucose-Capillary 101 (*) 70 - 99 (mg/dL)  GLUCOSE, CAPILLARY     Status: Abnormal   Collection Time   01/14/11 11:07 PM      Component Value Range   Glucose-Capillary 106 (*) 70 - 99 (mg/dL)   Comment 1 Notify RN     Comment 2 Documented in Chart    GLUCOSE, CAPILLARY     Status: Abnormal   Collection Time   01/15/11 12:06 AM      Component Value Range   Glucose-Capillary 102 (*) 70 - 99 (mg/dL)  GLUCOSE, CAPILLARY     Status: Normal   Collection Time   01/15/11  1:17 AM      Component Value Range   Glucose-Capillary 99  70 - 99 (mg/dL)  GLUCOSE, CAPILLARY     Status: Abnormal   Collection Time   01/15/11  2:27 AM      Component Value Range   Glucose-Capillary 101 (*) 70 - 99 (mg/dL)  GLUCOSE, CAPILLARY     Status: Normal   Collection Time   01/15/11  3:30 AM      Component Value Range   Glucose-Capillary 93  70 - 99 (mg/dL)    BASIC METABOLIC PANEL     Status: Abnormal   Collection Time   01/15/11  4:30 AM      Component Value Range   Sodium 131 (*) 135 - 145 (mEq/L)   Potassium 4.0  3.5 - 5.1 (mEq/L)   Chloride 96  96 - 112 (mEq/L)   CO2 23  19 - 32 (mEq/L)   Glucose, Bld 96  70 - 99 (mg/dL)   BUN 52 (*) 6 - 23 (mg/dL)   Creatinine, Ser 4.69 (*) 0.50 - 1.10 (mg/dL)   Calcium 7.9 (*) 8.4 - 10.5 (mg/dL)   GFR calc non Af Amer 39 (*) >90 (mL/min)   GFR calc Af Amer 45 (*) >90 (mL/min)  CBC     Status: Abnormal   Collection Time   01/15/11  4:30 AM      Component Value Range   WBC  11.1 (*) 4.0 - 10.5 (K/uL)   RBC 2.87 (*) 3.87 - 5.11 (MIL/uL)   Hemoglobin 8.9 (*) 12.0 - 15.0 (g/dL)   HCT 16.1 (*) 09.6 - 46.0 (%)   MCV 88.9  78.0 - 100.0 (fL)   MCH 31.0  26.0 - 34.0 (pg)   MCHC 34.9  30.0 - 36.0 (g/dL)   RDW 04.5  40.9 - 81.1 (%)   Platelets 148 (*) 150 - 400 (K/uL)  GLUCOSE, CAPILLARY     Status: Normal   Collection Time   01/15/11  4:38 AM      Component Value Range   Glucose-Capillary 91  70 - 99 (mg/dL)  GLUCOSE, CAPILLARY     Status: Normal   Collection Time   01/15/11  5:42 AM      Component Value Range   Glucose-Capillary 97  70 - 99 (mg/dL)  GLUCOSE, CAPILLARY     Status: Normal   Collection Time   01/15/11  6:46 AM      Component Value Range   Glucose-Capillary 94  70 - 99 (mg/dL)  GLUCOSE, CAPILLARY     Status: Normal   Collection Time   01/15/11  8:02 AM      Component Value Range   Glucose-Capillary 97  70 - 99 (mg/dL)  POCT I-STAT 3, BLOOD GAS (G3+)     Status: Abnormal   Collection Time   01/15/11  9:25 AM      Component Value Range   pH, Arterial 7.416 (*) 7.350 - 7.400    pCO2 arterial 40.2  35.0 - 45.0 (mmHg)   pO2, Arterial 91.0  80.0 - 100.0 (mmHg)   Bicarbonate 25.8 (*) 20.0 - 24.0 (mEq/L)   TCO2 27  0 - 100 (mmol/L)   O2 Saturation 97.0     Acid-Base Excess 1.0  0.0 - 2.0 (mmol/L)   Patient temperature 98.6 F     Collection site ARTERIAL LINE     Drawn by  Operator     Sample type ARTERIAL    GLUCOSE, CAPILLARY     Status: Normal   Collection Time   01/15/11  9:26 AM      Component Value Range   Glucose-Capillary 93  70 - 99 (mg/dL)  GLUCOSE, CAPILLARY     Status: Abnormal   Collection Time   01/15/11 10:32 AM      Component Value Range   Glucose-Capillary 127 (*) 70 - 99 (mg/dL)  CARDIAC PANEL(CRET KIN+CKTOT+MB+TROPI)     Status: Abnormal   Collection Time   01/15/11 11:20 AM      Component Value Range   Total CK 568 (*) 7 - 177 (U/L)   CK, MB 8.3 (*) 0.3 - 4.0 (ng/mL)   Troponin I 10.54 (*) <0.30 (ng/mL)   Relative Index 1.5  0.0 - 2.5   POCT I-STAT 3, BLOOD GAS (G3+)     Status: Abnormal   Collection Time   01/15/11 11:30 AM      Component Value Range   pH, Arterial 7.382  7.350 - 7.400    pCO2 arterial 43.0  35.0 - 45.0 (mmHg)   pO2, Arterial 75.0 (*) 80.0 - 100.0 (mmHg)   Bicarbonate 25.6 (*) 20.0 - 24.0 (mEq/L)   TCO2 27  0 - 100 (mmol/L)   O2 Saturation 95.0     Patient temperature 98.0 F     Collection site ARTERIAL LINE     Drawn by Operator     Sample type ARTERIAL  GLUCOSE, CAPILLARY     Status: Abnormal   Collection Time   01/15/11 11:35 AM      Component Value Range   Glucose-Capillary 121 (*) 70 - 99 (mg/dL)  CULTURE, SPUTUM-ASSESSMENT     Status: Normal   Collection Time   01/15/11 12:29 PM      Component Value Range   Specimen Description SPUTUM     Special Requests NONE     Sputum evaluation       Value: THIS SPECIMEN IS ACCEPTABLE. RESPIRATORY CULTURE REPORT TO FOLLOW.   Report Status 01/15/2011 FINAL    GLUCOSE, CAPILLARY     Status: Abnormal   Collection Time   01/15/11 12:46 PM      Component Value Range   Glucose-Capillary 134 (*) 70 - 99 (mg/dL)  GLUCOSE, CAPILLARY     Status: Abnormal   Collection Time   01/15/11  1:48 PM      Component Value Range   Glucose-Capillary 126 (*) 70 - 99 (mg/dL)  GLUCOSE, CAPILLARY     Status: Abnormal   Collection Time   01/15/11  2:56 PM       Component Value Range   Glucose-Capillary 122 (*) 70 - 99 (mg/dL)  GLUCOSE, CAPILLARY     Status: Abnormal   Collection Time   01/15/11  4:21 PM      Component Value Range   Glucose-Capillary 112 (*) 70 - 99 (mg/dL)  CARDIAC PANEL(CRET KIN+CKTOT+MB+TROPI)     Status: Abnormal   Collection Time   01/15/11  5:00 PM      Component Value Range   Total CK 559 (*) 7 - 177 (U/L)   CK, MB 7.3 (*) 0.3 - 4.0 (ng/mL)   Troponin I 8.88 (*) <0.30 (ng/mL)   Relative Index 1.3  0.0 - 2.5   CARBOXYHEMOGLOBIN     Status: Abnormal   Collection Time   01/15/11  5:10 PM      Component Value Range   Total hemoglobin 7.7 (*) 12.5 - 16.0 (g/dL)   O2 Saturation 16.1     Carboxyhemoglobin 1.6 (*) 0.5 - 1.5 (%)   Methemoglobin 0.8  0.0 - 1.5 (%)  GLUCOSE, CAPILLARY     Status: Abnormal   Collection Time   01/15/11  5:26 PM      Component Value Range   Glucose-Capillary 105 (*) 70 - 99 (mg/dL)  GLUCOSE, CAPILLARY     Status: Abnormal   Collection Time   01/15/11  6:21 PM      Component Value Range   Glucose-Capillary 113 (*) 70 - 99 (mg/dL)    Intake/Output Summary (Last 24 hours) at 01/15/11 1859 Last data filed at 01/15/11 1800  Gross per 24 hour  Intake 2273.03 ml  Output   3200 ml  Net -926.97 ml   ECHO:  - Left ventricle: Technically very limited study. The cavity size was normal. Wall thickness was increased in a pattern of mild LVH. The estimated ejection fraction was 30%. Diffuse hypokinesis. - Right ventricle: The cavity size was mildly dilated. Systolic function was mildly reduced. - Impressions: There is significant decrease in LV function since the study of 01/08/2011.   ASSESSMENT AND PLAN:  Principal Problem:  *Unstable angina Active Problems:   Coronary atherosclerosis of native coronary artery:  Post CABG  Atrial fib:  Now in NSR on amio and IV dilt.  Stop dilt in am.  Continue amiodarone  Reduced EF:  Will titrate afterload reduction as BP allows.  EF  during afib  might be underestimated.     Fayrene Fearing [facility] 01/15/2011 6:59 PM

## 2011-01-15 NOTE — Progress Notes (Signed)
Patient examined and record reviewed.Hemodynamics stable,labs satisfactory.Patient had better day, extubated,now on IV cardizem and amio A-pacing  .Continue  Renal dopamine [name],[name] 01/15/2011

## 2011-01-15 NOTE — Progress Notes (Signed)
4 Days Post-Op Procedure(s) (LRB): CORONARY ARTERY BYPASS GRAFTING (CABG) (N/A) Subjective:                       [address]            [name] [id number]          [phone]       Comfortable,alert on vent ready to extubate NSR CXR mild edema  Objective: Vital signs in last 24 hours: Temp:  [99.1 F (37.3 C)-100.5 F (38.1 C)] 99.9 F (37.7 C) (11/19 0737) Pulse Rate:  [30-151] 80  (11/19 0830) Cardiac Rhythm:  [-] Atrial paced (11/19 0800) Resp:  [16-46] 20  (11/19 0830) BP: (65-116)/(23-77) 107/52 mmHg (11/19 0800) SpO2:  [94 %-100 %] 98 % (11/19 0830) FiO2 (%):  [39.8 %-100 %] 39.8 % (11/19 0830)  Hemodynamic parameters for last 24 hours:    Intake/Output from previous day: 11/18 0701 - 11/19 0700 In: 3098.2 [P.O.:150; I.V.:1744.2; Blood:700; IV Piggyback:504] Out: 895 [Urine:895] Intake/Output this shift: Total I/O In: 90.3 [I.V.:60.3; NG/GT:30] Out: 1350 [Urine:250; Emesis/NG output:1100]  Neuro intact Lungs clear extrem cool  Lab Results:  [facility] 01/15/11 0430 01/14/11 0455  WBC 11.1* 8.6  HGB 8.9* 7.2*  HCT 25.5* 21.3*  PLT 148* 92*   BMET:  Basename 01/15/11 0430 01/14/11 0455  NA 131* 131*  K 4.0 4.4  CL 96 97  CO2 23 25  GLUCOSE 96 105*  BUN 52* 36*  CREATININE 1.42* 1.32*  CALCIUM 7.9* 8.1*    PT/INR: No results found for this basename: LABPROT,INR in the last 72 hours ABG    Component Value Date/Time   PHART 7.378 01/14/2011 1514   HCO3 24.3* 01/14/2011 1514   TCO2 25 01/14/2011 1514   ACIDBASEDEF 1.0 01/14/2011 1514   O2SAT 100.0 01/14/2011 1514   CBG (last 3)   Basename 01/15/11 0802 01/15/11 0646 01/15/11 0542  GLUCAP 97 94 97    Assessment/Plan: S/P Procedure(s) (LRB): CORONARY ARTERY BYPASS GRAFTING (CABG) (N/A) Re-intubated for low BP due to SVT  Will extubate,check 2D Echo,start renal dopamine to increase urine out put   LOS: 10 days    [name],Milyn Stapleton 01/15/2011

## 2011-01-15 NOTE — Progress Notes (Signed)
*  PRELIMINARY RESULTS* Echocardiogram 2D Echocardiogram has been performed.  Glean Salen Antonio RDCS 01/15/2011, 11:00 AM

## 2011-01-15 NOTE — Progress Notes (Addendum)
Inpatient Diabetes Program Recommendations  AACE/ADA: New Consensus Statement on Inpatient Glycemic Control (2009)  Target Ranges:  Prepandial:   less than 140 mg/dL      Peak postprandial:   less than 180 mg/dL (1-2 hours)      Critically ill patients:  140 - 180 mg/dL   Reason for Visit: Pt. Extubated this morning.  She has been on insulin drip all weekend.    Inpatient Diabetes Program Recommendations Insulin - IV drip/GlucoStabilizer: Note patient ready to transition off insulin drip. Insulin - Basal: Patient currently on Lantus 75 units twice a day.   Correction (SSI): Add Novolog TCTS correction every 4 hours.  Insulin - Meal Coverage: Note patient was on Novolog 84 units tid with meals at home.  May consider starting with Novolog 10 units tid with meals.  Will likely need titration based on post-prandilal blood glucoses. HgbA1C: A1c=8.2%.  Needs to follow-up with her endocrinologist after discharge from hospital.  Note:

## 2011-01-15 NOTE — Progress Notes (Signed)
CRITICAL VALUE ALERT  Critical value received: troponin10.54 ckmb8.3  Date of notification:  01/15/11  Time of notification:  1250  Critical value read back:yes  Nurse who received alert:  Acey Lav  MD notified (1st page):  Zenaida Niece trigt  Time of first page:  1300  MD notified (2nd page):  Time of second page:  Responding MD:  Zenaida Niece trigt  Time MD responded:  [phone]

## 2011-01-16 ENCOUNTER — Inpatient Hospital Stay (HOSPITAL_COMMUNITY): Payer: Managed Care, Other (non HMO)

## 2011-01-16 DIAGNOSIS — I4891 Unspecified atrial fibrillation: Secondary | ICD-10-CM

## 2011-01-16 LAB — CBC
HCT: 25.4 % — ABNORMAL LOW (ref 36.0–46.0)
Hemoglobin: 8.8 g/dL — ABNORMAL LOW (ref 12.0–15.0)
MCH: 31.2 pg (ref 26.0–34.0)
MCHC: 34.6 g/dL (ref 30.0–36.0)
MCV: 90.1 fL (ref 78.0–100.0)
Platelets: 108 10*3/uL — ABNORMAL LOW (ref 150–400)
RBC: 2.82 MIL/uL — ABNORMAL LOW (ref 3.87–5.11)
RDW: 14.1 % (ref 11.5–15.5)
WBC: 5.9 10*3/uL (ref 4.0–10.5)

## 2011-01-16 LAB — COMPREHENSIVE METABOLIC PANEL
ALT: 46 U/L — ABNORMAL HIGH (ref 0–35)
AST: 63 U/L — ABNORMAL HIGH (ref 0–37)
Albumin: 2.9 g/dL — ABNORMAL LOW (ref 3.5–5.2)
Alkaline Phosphatase: 58 U/L (ref 39–117)
BUN: 47 mg/dL — ABNORMAL HIGH (ref 6–23)
CO2: 28 mEq/L (ref 19–32)
Calcium: 8.4 mg/dL (ref 8.4–10.5)
Chloride: 98 mEq/L (ref 96–112)
Creatinine, Ser: 0.98 mg/dL (ref 0.50–1.10)
GFR calc Af Amer: 71 mL/min — ABNORMAL LOW (ref >90)
GFR calc non Af Amer: 61 mL/min — ABNORMAL LOW (ref >90)
Glucose, Bld: 89 mg/dL (ref 70–99)
Potassium: 3.9 mEq/L (ref 3.5–5.1)
Sodium: 135 mEq/L (ref 135–145)
Total Bilirubin: 0.6 mg/dL (ref 0.3–1.2)
Total Protein: 6.5 g/dL (ref 6.0–8.3)

## 2011-01-16 LAB — GLUCOSE, CAPILLARY
Glucose-Capillary: 109 mg/dL — ABNORMAL HIGH (ref 70–99)
Glucose-Capillary: 95 mg/dL (ref 70–99)
Glucose-Capillary: 86 mg/dL (ref 70–99)

## 2011-01-16 LAB — TSH: TSH: 0.06 u[IU]/mL — ABNORMAL LOW (ref 0.350–4.500)

## 2011-01-16 MED ORDER — TRAMADOL HCL 50 MG PO TABS
50.0000 mg | ORAL_TABLET | Freq: Four times a day (QID) | ORAL | Status: DC | PRN
  Administered 2011-01-16 – 2011-01-18 (×6): 50 mg via ORAL
  Filled 2011-01-16 (×7): qty 1

## 2011-01-16 MED ORDER — INSULIN ASPART 100 UNIT/ML ~~LOC~~ SOLN
0.0000 [IU] | SUBCUTANEOUS | Status: DC
  Administered 2011-01-16: 2 [IU] via SUBCUTANEOUS

## 2011-01-16 MED ORDER — DM-GUAIFENESIN ER 30-600 MG PO TB12
1.0000 | ORAL_TABLET | Freq: Two times a day (BID) | ORAL | Status: DC | PRN
  Filled 2011-01-16: qty 1

## 2011-01-16 MED ORDER — INSULIN ASPART 100 UNIT/ML ~~LOC~~ SOLN
0.0000 [IU] | SUBCUTANEOUS | Status: AC
  Filled 2011-01-16: qty 3

## 2011-01-16 MED ORDER — POTASSIUM CHLORIDE 10 MEQ/50ML IV SOLN
10.0000 meq | INTRAVENOUS | Status: AC
  Administered 2011-01-16 (×2): 10 meq via INTRAVENOUS
  Filled 2011-01-16 (×2): qty 50

## 2011-01-16 MED ORDER — PROMETHAZINE HCL 25 MG/ML IJ SOLN: 12.5000 mg | Freq: Four times a day (QID) | INTRAMUSCULAR | Status: DC | PRN

## 2011-01-16 MED FILL — Electrolyte-R (PH 7.4) Solution: INTRAVENOUS | Qty: 5000 | Status: AC

## 2011-01-16 MED FILL — Heparin Sodium (Porcine) Inj 1000 Unit/ML: INTRAMUSCULAR | Qty: 3 | Status: AC

## 2011-01-16 MED FILL — Sodium Chloride IV Soln 0.9%: INTRAVENOUS | Qty: 1000 | Status: CN

## 2011-01-16 MED FILL — Sodium Chloride Irrigation Soln 0.9%: Qty: 3000 | Status: CN

## 2011-01-16 MED FILL — Sodium Chloride Irrigation Soln 0.9%: Qty: 3000 | Status: AC

## 2011-01-16 MED FILL — Sodium Chloride IV Soln 0.9%: INTRAVENOUS | Qty: 1000 | Status: AC

## 2011-01-16 MED FILL — Heparin Sodium (Porcine) Inj 1000 Unit/ML: INTRAMUSCULAR | Qty: 3 | Status: CN

## 2011-01-16 MED FILL — Medication: Qty: 1 | Status: AC

## 2011-01-16 MED FILL — Electrolyte-R (PH 7.4) Solution: INTRAVENOUS | Qty: 5000 | Status: CN

## 2011-01-16 NOTE — Plan of Care (Signed)
Problem: Phase III Progression Outcomes Goal: Ambulates with pain/dyspnea controlled Outcome: Progressing PT evaluation completed.  Patient will benefit from Rehab consult.  MD:  Please order if you agree.  Continue PT in hospital.  Thanks.  [facility] [phone] (pager)

## 2011-01-16 NOTE — Progress Notes (Signed)
TCTS BRIEF PROGRESS NOTE   Stable day.  Maintaining A-paced rhythm.  BP stable. UOP adequate. Continue current plan.  [name] H

## 2011-01-16 NOTE — Progress Notes (Signed)
Physical Therapy Evaluation Patient Details Name: [name] MRN: [id number] DOB: 11-02-1950 Today's Date: 01/16/2011  Problem List:  Patient Active Problem List  Diagnoses  . Type II or unspecified type diabetes mellitus without mention of complication, uncontrolled  . HYPERLIPIDEMIA-MIXED  . DEPRESSION  . Essential hypertension, benign  . DIVERTICULOSIS  . FATTY LIVER DISEASE  . ARTHRITIS  . Unstable angina  . Coronary atherosclerosis of native coronary artery  . Abnormal TSH  . Thrombocytopenia  . Carotid stenosis  . Atrial fibrillation    Past Medical History:  Past Medical History  Diagnosis Date  . Coronary atherosclerosis of native coronary artery     BMS circ 2001, DES LAD 2005, DES LAD and RCA 2008  . Drug allergy     Allergy to Plavix (hives) - took Ticlid in past  . Essential hypertension, benign   . Mixed hyperlipidemia   . Type 2 diabetes mellitus   . Breast cancer     Chemo and mastectomy  . Angina   . Myocardial infarction ~ 2002  . Diabetes mellitus   . Shortness of breath 01/05/11     "w/exertion"  . Arthritis     rheumatoid  . Depression   . Peripheral neuropathy     "from diabetes"  . Peptic ulcer disease ~ 1975   Past Surgical History:  Past Surgical History  Procedure Date  . Right mastectomy   . Mastectomy 1988    right  . Cholecystectomy 1969  . Abdominal hysterectomy 1989  . Tonsillectomy and adenoidectomy 1979  . Appendectomy 1969  . Tubal ligation 1972  . Coronary angioplasty with stent placement 4 Times    "had 2 stents each time; last time 2010"  . Cardiac catheterization 01/05/11    "no stents"    PT Assessment/Plan/Recommendation PT Assessment Clinical Impression Statement: Patient s/p CABG with VDRF and multiple complications with decreased mobility secondary to decreased balance and endurance.  May need Rehab to reach maximal functional level to D/C home with husband. PT Recommendation/Assessment: Patient will need  skilled PT in the acute care venue PT Problem List: Decreased activity tolerance;Decreased balance;Decreased mobility;Decreased cognition;Decreased knowledge of use of DME;Decreased safety awareness;Cardiopulmonary status limiting activity;Pain;Obesity;Decreased knowledge of precautions PT Therapy Diagnosis : Difficulty walking;Acute pain PT Plan PT Frequency: Min 3X/week PT Treatment/Interventions: DME instruction;Gait training;Stair training;Functional mobility training;Therapeutic exercise;Balance training;Patient/family education PT Recommendation Recommendations for Other Services: Rehab consult Follow Up Recommendations: 24 hour supervision/assistance;Home health PT Equipment Recommended: Rolling walker with 5" wheels;3 in 1 bedside comode PT Goals  Acute Rehab PT Goals PT Goal Formulation: With patient Time For Goal Achievement: 7 days Pt will Roll Supine to Left Side: with min assist;with rail PT Goal: Rolling Supine to Left Side - Progress: Other (comment) Pt will go Supine/Side to Sit: with min assist;with rail;with HOB 0 degrees PT Goal: Supine/Side to Sit - Progress: Other (comment) Pt will Transfer Sit to Stand/Stand to Sit: with supervision;without upper extremity assist PT Transfer Goal: Sit to Stand/Stand to Sit - Progress: Other (comment) Pt will Ambulate: >150 feet;with min assist;with least restrictive assistive device PT Goal: Ambulate - Progress: Other (comment) Pt will Go Up / Down Stairs: 1-2 stairs;with min assist;with least restrictive assistive device PT Goal: Up/Down Stairs - Progress: Other (comment) Additional Goals Additional Goal #1: Pt. to follow sternal precautions with mobility independently without cues. PT Goal: Additional Goal #1 - Progress: Other (comment)  PT Evaluation Precautions/Restrictions  Precautions Precautions: Sternal;Fall Required Braces or Orthoses: No Prior Functioning  Home Living Lives With: Spouse Receives Help From:  Family Type of Home: House Home Layout: One level Home Access: Level entry Bathroom Shower/Tub: Tub/shower unit;Curtain Bathroom Toilet: Standard (vanity beside toilet) Bathroom Accessibility: Yes How Accessible: Accessible via walker Home Adaptive Equipment: Hand-held shower hose;Shower chair without back;Walker - standard Prior Function Level of Independence: Independent with homemaking with ambulation Driving: Yes Vocation: On disability Comments: Neuropathy and torn rotator cuffs Cognition Cognition Arousal/Alertness: Awake/alert Overall Cognitive Status: Appears within functional limits for tasks assessed Orientation Level: Oriented X4 Sensation/Coordination Sensation Light Touch: Appears Intact Stereognosis: Not tested Hot/Cold: Not tested Proprioception: Not tested Coordination Gross Motor Movements are Fluid and Coordinated: Yes Fine Motor Movements are Fluid and Coordinated: Yes Extremity Assessment RUE Assessment RUE Assessment: Within Functional Limits LUE Assessment LUE Assessment: Within Functional Limits RLE Assessment RLE Assessment: Within Functional Limits LLE Assessment LLE Assessment: Within Functional Limits Mobility (including Balance) Bed Mobility Bed Mobility: Yes Rolling Left: 1: +2 Total assist;Patient percentage (comment) (pt = 45%) Rolling Left Details (indicate cue type and reason): Pt. needed cues for log roll for sternal precautions Left Sidelying to Sit: 1: +2 Total assist;Patient percentage (comment);With rails;HOB flat (pt. = 50%) Left Sidelying to Sit Details (indicate cue type and reason): Cues needed for technique Sit to Supine - Left: 1: +2 Total assist;Patient percentage (comment);With rail;HOB flat (pt = 65%) Sit to Supine - Left Details (indicate cue type and reason): Needed assist for LEs and cues for technique Transfers Transfers: Yes Sit to Stand: 1: +2 Total assist;Patient percentage (comment);From bed;Without upper extremity  assist (pt. hugged pillow and pt = 70% for sit to stand) Sit to Stand Details (indicate cue type and reason): Remembered to hold pillow Stand to Sit: 4: Min assist;To bed (doing well controlling descent) Stand to Sit Details: Pt. held pillow without cues for stand to sit Ambulation/Gait Ambulation/Gait: Yes Ambulation/Gait Assistance: 1: +2 Total assist;Patient percentage (comment) (pt. = 70%) Ambulation/Gait Assistance Details (indicate cue type and reason): Pt. pushed wheelchair to hold equipment.  Pt. with shortened step length bil feet.  Needed cues to stay close to wheelchair.  Pt. somewhat lethargic and dizzy therefore limited distance.  Took 10 steps forward and backward. Ambulation Distance (Feet): 20 Feet Assistive device: Other (Comment) (pushed wheelchair) Gait Pattern: Step-to pattern;Decreased stride length;Trunk flexed Gait velocity: Slow cadence Stairs: No Wheelchair Mobility Wheelchair Mobility: No  Posture/Postural Control Posture/Postural Control: Postural limitations Postural Limitations: Forward flexed posture.   Balance Balance Assessed: No Exercise    End of Session PT - End of Session Equipment Utilized During Treatment: Gait belt Activity Tolerance: Patient limited by fatigue (and dizziness) Patient left: in bed;with call bell in reach;with family/visitor present Nurse Communication: Mobility status for ambulation General Behavior During Session: [facility] for tasks performed Cognition: [facility] for tasks performed  INGOLD,Thersia Petraglia 01/16/2011, 4:18 PM [facility] [phone] (pager)

## 2011-01-16 NOTE — Progress Notes (Signed)
SUBJECTIVE:   Maintaining NSR.  Weak but improved.  Worked with rehab today.  No acute dyspnea or pain.  PHYSICAL EXAM Filed Vitals:   01/16/11 1434 01/16/11 1500 01/16/11 1600 01/16/11 1612  BP: 107/57 119/57 109/59   Pulse: 80 80 80   Temp:    98.1 F (36.7 C)  TempSrc:    Oral  Resp: 27 22 21    Height:      Weight:      SpO2: 99% 100% 99%    General:  Weak but improving, no distress Lungs:  Clear Heart:  RRR, no rub Abdomen:  Decreased bowel sounds Extremities:  Diffuse mild edema  LABS: Lab Results  Component Value Date   CKTOTAL 559* 01/15/2011   CKMB 7.3* 01/15/2011   TROPONINI 8.88* 01/15/2011   Results for orders placed during the hospital encounter of 01/05/11 (from the past 24 hour(s))  GLUCOSE, CAPILLARY     Status: Abnormal   Collection Time   01/15/11  5:26 PM      Component Value Range   Glucose-Capillary 105 (*) 70 - 99 (mg/dL)  GLUCOSE, CAPILLARY     Status: Abnormal   Collection Time   01/15/11  6:21 PM      Component Value Range   Glucose-Capillary 113 (*) 70 - 99 (mg/dL)  BASIC METABOLIC PANEL     Status: Abnormal   Collection Time   01/15/11  7:23 PM      Component Value Range   Sodium 132 (*) 135 - 145 (mEq/L)   Potassium 4.2  3.5 - 5.1 (mEq/L)   Chloride 96  96 - 112 (mEq/L)   CO2 25  19 - 32 (mEq/L)   Glucose, Bld 161 (*) 70 - 99 (mg/dL)   BUN 50 (*) 6 - 23 (mg/dL)   Creatinine, Ser 7.82  0.50 - 1.10 (mg/dL)   Calcium 8.1 (*) 8.4 - 10.5 (mg/dL)   GFR calc non Af Amer 56 (*) >90 (mL/min)   GFR calc Af Amer 65 (*) >90 (mL/min)  CBC     Status: Abnormal   Collection Time   01/15/11  7:23 PM      Component Value Range   WBC 7.3  4.0 - 10.5 (K/uL)   RBC 2.86 (*) 3.87 - 5.11 (MIL/uL)   Hemoglobin 8.9 (*) 12.0 - 15.0 (g/dL)   HCT 95.6 (*) 21.3 - 46.0 (%)   MCV 89.9  78.0 - 100.0 (fL)   MCH 31.1  26.0 - 34.0 (pg)   MCHC 34.6  30.0 - 36.0 (g/dL)   RDW 08.6  57.8 - 46.9 (%)   Platelets 112 (*) 150 - 400 (K/uL)  POCT I-STAT 3, BLOOD  GAS (G3+)     Status: Abnormal   Collection Time   01/15/11  7:42 PM      Component Value Range   pH, Arterial 7.380  7.350 - 7.400    pCO2 arterial 44.3  35.0 - 45.0 (mmHg)   pO2, Arterial 85.0  80.0 - 100.0 (mmHg)   Bicarbonate 26.1 (*) 20.0 - 24.0 (mEq/L)   TCO2 27  0 - 100 (mmol/L)   O2 Saturation 96.0     Acid-Base Excess 1.0  0.0 - 2.0 (mmol/L)   Patient temperature 99.0 F     Collection site RADIAL, ALLEN'S TEST ACCEPTABLE     Drawn by Nurse     Sample type ARTERIAL    GLUCOSE, CAPILLARY     Status: Abnormal   Collection Time  01/15/11  7:45 PM      Component Value Range   Glucose-Capillary 146 (*) 70 - 99 (mg/dL)  GLUCOSE, CAPILLARY     Status: Abnormal   Collection Time   01/15/11  9:01 PM      Component Value Range   Glucose-Capillary 125 (*) 70 - 99 (mg/dL)  GLUCOSE, CAPILLARY     Status: Abnormal   Collection Time   01/15/11 10:32 PM      Component Value Range   Glucose-Capillary 109 (*) 70 - 99 (mg/dL)  GLUCOSE, CAPILLARY     Status: Normal   Collection Time   01/15/11 11:52 PM      Component Value Range   Glucose-Capillary 95  70 - 99 (mg/dL)  GLUCOSE, CAPILLARY     Status: Normal   Collection Time   01/16/11  1:24 AM      Component Value Range   Glucose-Capillary 88  70 - 99 (mg/dL)  GLUCOSE, CAPILLARY     Status: Normal   Collection Time   01/16/11  4:24 AM      Component Value Range   Glucose-Capillary 86  70 - 99 (mg/dL)  CBC     Status: Abnormal   Collection Time   01/16/11  4:30 AM      Component Value Range   WBC 5.9  4.0 - 10.5 (K/uL)   RBC 2.82 (*) 3.87 - 5.11 (MIL/uL)   Hemoglobin 8.8 (*) 12.0 - 15.0 (g/dL)   HCT 16.1 (*) 09.6 - 46.0 (%)   MCV 90.1  78.0 - 100.0 (fL)   MCH 31.2  26.0 - 34.0 (pg)   MCHC 34.6  30.0 - 36.0 (g/dL)   RDW 04.5  40.9 - 81.1 (%)   Platelets 108 (*) 150 - 400 (K/uL)  COMPREHENSIVE METABOLIC PANEL     Status: Abnormal   Collection Time   01/16/11  4:30 AM      Component Value Range   Sodium 135  135 - 145  (mEq/L)   Potassium 3.9  3.5 - 5.1 (mEq/L)   Chloride 98  96 - 112 (mEq/L)   CO2 28  19 - 32 (mEq/L)   Glucose, Bld 89  70 - 99 (mg/dL)   BUN 47 (*) 6 - 23 (mg/dL)   Creatinine, Ser 9.14  0.50 - 1.10 (mg/dL)   Calcium 8.4  8.4 - 78.2 (mg/dL)   Total Protein 6.5  6.0 - 8.3 (g/dL)   Albumin 2.9 (*) 3.5 - 5.2 (g/dL)   AST 63 (*) 0 - 37 (U/L)   ALT 46 (*) 0 - 35 (U/L)   Alkaline Phosphatase 58  39 - 117 (U/L)   Total Bilirubin 0.6  0.3 - 1.2 (mg/dL)   GFR calc non Af Amer 61 (*) >90 (mL/min)   GFR calc Af Amer 71 (*) >90 (mL/min)  TSH     Status: Abnormal   Collection Time   01/16/11  4:30 AM      Component Value Range   TSH 0.060 (*) 0.350 - 4.500 (uIU/mL)  GLUCOSE, CAPILLARY     Status: Normal   Collection Time   01/16/11  6:52 AM      Component Value Range   Glucose-Capillary 89  70 - 99 (mg/dL)  GLUCOSE, CAPILLARY     Status: Normal   Collection Time   01/16/11  7:48 AM      Component Value Range   Glucose-Capillary 85  70 - 99 (mg/dL)   Comment 1 Documented in  Chart     Comment 2 Notify RN    GLUCOSE, CAPILLARY     Status: Abnormal   Collection Time   01/16/11 11:39 AM      Component Value Range   Glucose-Capillary 129 (*) 70 - 99 (mg/dL)   Comment 1 Notify RN     Comment 2 Documented in Chart    GLUCOSE, CAPILLARY     Status: Normal   Collection Time   01/16/11  4:14 PM      Component Value Range   Glucose-Capillary 86  70 - 99 (mg/dL)   Comment 1 Documented in Chart     Comment 2 Notify RN      Intake/Output Summary (Last 24 hours) at 01/16/11 1725 Last data filed at 01/16/11 1700  Gross per 24 hour  Intake 2060.7 ml  Output   3031 ml  Net -970.3 ml    ASSESSMENT AND PLAN:  Principal Problem:  *Unstable angina Active Problems:   Coronary atherosclerosis of native coronary artery:  Post CABG  Atrial fib:  Now in NSR on amio.  Convert to po when nausea is improved.    Reduced EF:  Will titrate afterload reduction as BP allows.  EF during afib might  be underestimated.    We will follow as needed.   Rollene Rotunda 01/16/2011 5:25 PM

## 2011-01-16 NOTE — Progress Notes (Signed)
Speech Language/Pathology Clinical/Bedside Swallow Evaluation Patient Details  Name: [name] MRN: [id number] DOB: [birth year] Today's Date: 01/16/2011  Past Medical History:  Past Medical History  Diagnosis Date  . Coronary atherosclerosis of native coronary artery     BMS circ 2001, DES LAD 2005, DES LAD and RCA 2008  . Drug allergy     Allergy to Plavix (hives) - took Ticlid in past  . Essential hypertension, benign   . Mixed hyperlipidemia   . Type 2 diabetes mellitus   . Breast cancer     Chemo and mastectomy  . Angina   . Myocardial infarction ~ 2002  . Diabetes mellitus   . Shortness of breath 01/05/11     "w/exertion"  . Arthritis     rheumatoid  . Depression   . Peripheral neuropathy     "from diabetes"  . Peptic ulcer disease ~ 1975   Past Surgical History:  Past Surgical History  Procedure Date  . Right mastectomy   . Mastectomy 1988    right  . Cholecystectomy 1969  . Abdominal hysterectomy 1989  . Tonsillectomy and adenoidectomy 1979  . Appendectomy 1969  . Tubal ligation 1972  . Coronary angioplasty with stent placement 4 Times    "had 2 stents each time; last time 2010"  . Cardiac catheterization 01/05/11    "no stents"    Assessment/Recommendations/Treatment Plan   SLP Assessment Clinical Impression Statement: Pt demonstrates functional swallow with no s/s of aspiration. Pt is safe to continue regular diet with thin liquids.  Pt independently utilizes aspiration precautions - small sips, upright, slwo rate.  No SLP f/u needed.  Will sign off.  Please reorder if futher needs arise.   Risk for Aspiration: Mild  Recommendations Solid Consistency: Regular Liquid Consistency: Thin Medication Administration: Whole meds with liquid Supervision: Patient able to self feed  Individuals Consulted Consulted and Agree with Results and Recommendations: Patient;Family member/caregiver Family Member Consulted: spouse  Harlon Ditty, [place] CCC-SLP  [phone]  Claudine Mouton 01/16/2011,3:40 PM

## 2011-01-16 NOTE — Progress Notes (Signed)
5 Days Post-Op Procedure(s) (LRB): CORONARY ARTERY BYPASS GRAFTING (CABG) (N/A)                     [address]            [name] [address]          [phone]         Subjective: Weak,nauseated OOB to chair A-pacing w/ slow sinus CXR wet on BID lasix Objective: Vital signs in last 24 hours: Temp:  [97.4 F (36.3 C)-98.3 F (36.8 C)] 97.8 F (36.6 C) (11/20 0740) Pulse Rate:  [62-135] 80  (11/20 0700) Cardiac Rhythm:  [-] Atrial paced (11/20 0400) Resp:  [15-39] 20  (11/20 0700) BP: (91-125)/(43-78) 123/50 mmHg (11/19 1500) SpO2:  [86 %-99 %] 98 % (11/20 0700) FiO2 (%):  [39.8 %-40.3 %] 40 % (11/19 1500) Weight:  [198 lb 3.1 oz (89.9 kg)] 198 lb 3.1 oz (89.9 kg) (11/20 0600)  Hemodynamic parameters for last 24 hours:    Intake/Output from previous day: 11/19 0701 - 11/20 0700 In: 1965.7 [P.O.:480; I.V.:1297.7; NG/GT:30; IV Piggyback:158] Out: 4350 [Urine:3250; Emesis/NG output:1100] Intake/Output this shift:    EXAM  No murmur extrem warm Sedated,too much narcotics Wet cough Fluid overloaded Lab Results:  Basename 01/16/11 0430 01/15/11 1923  WBC 5.9 7.3  HGB 8.8* 8.9*  HCT 25.4* 25.7*  PLT 108* 112*   BMET:  Basename 01/16/11 0430 01/15/11 1923  NA 135 132*  K 3.9 4.2  CL 98 96  CO2 28 25  GLUCOSE 89 161*  BUN 47* 50*  CREATININE 0.98 1.05  CALCIUM 8.4 8.1*    PT/INR: No results found for this basename: LABPROT,INR in the last 72 hours ABG    Component Value Date/Time   PHART 7.380 01/15/2011 1942   HCO3 26.1* 01/15/2011 1942   TCO2 27 01/15/2011 1942   ACIDBASEDEF 1.0 01/14/2011 1514   O2SAT 96.0 01/15/2011 1942   CBG (last 3)   Basename 01/16/11 0748 01/16/11 0652 01/16/11 0424  GLUCAP 85 89 86    Assessment/Plan: S/P Procedure(s) (LRB): CORONARY ARTERY BYPASS GRAFTING (CABG) (N/A)  Continue IV amio due to nausea Wean renal dopamine PT,ambulate Adv diet F/U sputum C+S,cont Fortaz, swalllow eval Hi risk  pneumonia     LOS: 11 days    [name],PETER 01/16/2011

## 2011-01-17 ENCOUNTER — Inpatient Hospital Stay (HOSPITAL_COMMUNITY): Payer: Managed Care, Other (non HMO)

## 2011-01-17 LAB — CBC
HCT: 25.3 % — ABNORMAL LOW (ref 36.0–46.0)
Hemoglobin: 8.5 g/dL — ABNORMAL LOW (ref 12.0–15.0)
MCH: 30.8 pg (ref 26.0–34.0)
MCHC: 33.6 g/dL (ref 30.0–36.0)
MCV: 91.7 fL (ref 78.0–100.0)
Platelets: 108 10*3/uL — ABNORMAL LOW (ref 150–400)
RBC: 2.76 MIL/uL — ABNORMAL LOW (ref 3.87–5.11)
RDW: 13.9 % (ref 11.5–15.5)
WBC: 4.2 10*3/uL (ref 4.0–10.5)

## 2011-01-17 LAB — GLUCOSE, CAPILLARY
Glucose-Capillary: 64 mg/dL — ABNORMAL LOW (ref 70–99)
Glucose-Capillary: 72 mg/dL (ref 70–99)

## 2011-01-17 LAB — BASIC METABOLIC PANEL
BUN: 41 mg/dL — ABNORMAL HIGH (ref 6–23)
CO2: 30 mEq/L (ref 19–32)
Calcium: 8.2 mg/dL — ABNORMAL LOW (ref 8.4–10.5)
Chloride: 97 mEq/L (ref 96–112)
Creatinine, Ser: 0.95 mg/dL (ref 0.50–1.10)
GFR calc Af Amer: 73 mL/min — ABNORMAL LOW (ref >90)
GFR calc non Af Amer: 63 mL/min — ABNORMAL LOW (ref >90)
Glucose, Bld: 70 mg/dL (ref 70–99)
Potassium: 3.7 mEq/L (ref 3.5–5.1)
Sodium: 136 mEq/L (ref 135–145)

## 2011-01-17 MED ORDER — INSULIN GLARGINE 100 UNIT/ML ~~LOC~~ SOLN
75.0000 [IU] | Freq: Every day | SUBCUTANEOUS | Status: DC
  Administered 2011-01-17: 75 [IU] via SUBCUTANEOUS
  Filled 2011-01-17: qty 3

## 2011-01-17 MED ORDER — POTASSIUM CHLORIDE 10 MEQ/50ML IV SOLN
INTRAVENOUS | Status: AC
  Administered 2011-01-17: 10 meq via INTRAVENOUS
  Filled 2011-01-17: qty 150

## 2011-01-17 MED ORDER — INSULIN ASPART 100 UNIT/ML ~~LOC~~ SOLN
4.0000 [IU] | Freq: Three times a day (TID) | SUBCUTANEOUS | Status: DC
  Administered 2011-01-19 – 2011-01-22 (×10): 4 [IU] via SUBCUTANEOUS
  Filled 2011-01-17: qty 3

## 2011-01-17 MED ORDER — POTASSIUM CHLORIDE 10 MEQ/50ML IV SOLN
10.0000 meq | INTRAVENOUS | Status: AC
  Administered 2011-01-17 (×3): 10 meq via INTRAVENOUS

## 2011-01-17 MED ORDER — AMIODARONE HCL 200 MG PO TABS
400.0000 mg | ORAL_TABLET | Freq: Two times a day (BID) | ORAL | Status: DC
  Administered 2011-01-17 – 2011-01-23 (×13): 400 mg via ORAL
  Filled 2011-01-17 (×15): qty 2

## 2011-01-17 MED ORDER — INSULIN ASPART 100 UNIT/ML ~~LOC~~ SOLN
0.0000 [IU] | Freq: Three times a day (TID) | SUBCUTANEOUS | Status: DC
  Administered 2011-01-20: 7 [IU] via SUBCUTANEOUS
  Administered 2011-01-20 – 2011-01-22 (×4): 3 [IU] via SUBCUTANEOUS
  Administered 2011-01-22 – 2011-01-23 (×3): 4 [IU] via SUBCUTANEOUS
  Administered 2011-01-23: 3 [IU] via SUBCUTANEOUS
  Administered 2011-01-24: 4 [IU] via SUBCUTANEOUS
  Administered 2011-01-24 – 2011-01-25 (×3): 3 [IU] via SUBCUTANEOUS
  Administered 2011-01-25: 4 [IU] via SUBCUTANEOUS
  Filled 2011-01-17: qty 3

## 2011-01-17 MED FILL — Magnesium Sulfate Inj 50%: INTRAMUSCULAR | Qty: 10 | Status: AC

## 2011-01-17 MED FILL — Phenylephrine HCl Inj 10 MG/ML: INTRAMUSCULAR | Qty: 2 | Status: AC

## 2011-01-17 MED FILL — Dextrose Inj 5%: INTRAVENOUS | Qty: 250 | Status: AC

## 2011-01-17 MED FILL — Potassium Chloride Inj 2 mEq/ML: INTRAVENOUS | Qty: 40 | Status: AC

## 2011-01-17 NOTE — Progress Notes (Signed)
   CARE MANAGEMENT NOTE 01/17/2011  Patient:  [name], [name]   Account Number:  [id number]  Date Initiated:  01/11/2011  Documentation initiated by:  GRAVES-BIGELOW,BRENDA  Subjective/Objective Assessment:   Pt is from [place] [facility] Severe 2 vessel CAD awaiting CABG. Awaiting ticagrelor washout. Plan CABG for 01-11-11.     Action/Plan:   Pt will benefit from [facility] RN at d/c.   Anticipated DC Date:  01/22/2011   Anticipated DC Plan:  SKILLED NURSING FACILITY  In-house referral  Clinical Social Worker      DC Planning Services  CM consult      Choice offered to / List presented to:             Status of service:  In process, will continue to follow Medicare Important Message given?   (If response is "NO", the following Medicare IM given date fields will be blank) Date Medicare IM given:   Date Additional Medicare IM given:    Discharge Disposition:    Per UR Regulation:  Reviewed for med. necessity/level of care/duration of stay  Comments:  01/17/11 Ezreal Turay,RN,BSN 1510 PHYSICAL THERAPIST'S RECOMMENDATIONS ARE FOR INPT REHAB. OBTAINED ORDER FOR REHAB CONSULT FROM PA.  PT HAS HUSBAND, FAMILY TO ASSIST AT DISCHARGE.  WILL FOLLOW FOR RESULTS OF CONSULT. Pager # [phone]  01-17-11 10:15am Avie Arenas, RNBSN [phone] UR Completed.  01-11-11 1506 Tomi Bamberger, RN,BSN [phone] CM will continue to f/u for additional d/c plans.

## 2011-01-17 NOTE — Progress Notes (Signed)
CBG: 64   Treatment: 15 GM carbohydrate snack  Symptoms: None  Follow-up CBG: Time:72 CBG Result:0550  Possible Reasons for Event: Inadequate meal intake and Medication regimen: Same as prior  Comments/MD notified: MD to reassess medication regimen in AM    Liylah Najarro, Gayleen [place]rem

## 2011-01-17 NOTE — Progress Notes (Signed)
Physical Therapy Treatment Patient Details Name: [name] MRN: [id number] DOB: [birth year] Today's Date: 01/17/2011  PT Assessment/Plan  PT - Assessment/Plan Comments on Treatment Session: Patient is s/p CABG with complications with VDRF post op with decreased endurance and decreased balance secondary to complications.  Will benefit from Rehab to progress to independent level to D/C home with husband.  VSS throughout rx.   PT Plan: Discharge plan remains appropriate;Frequency remains appropriate PT Frequency: Min 3X/week Follow Up Recommendations: 24 hour supervision/assistance;Inpatient Rehab Equipment Recommended: Rolling walker with 5" wheels;3 in 1 bedside comode PT Goals  Acute Rehab PT Goals PT Goal: Rolling Supine to Left Side - Progress: Progressing toward goal PT Goal: Supine/Side to Sit - Progress: Progressing toward goal PT Transfer Goal: Sit to Stand/Stand to Sit - Progress: Progressing toward goal PT Goal: Ambulate - Progress: Progressing toward goal PT Goal: Up/Down Stairs - Progress: Other (comment) Additional Goals PT Goal: Additional Goal #1 - Progress: Progressing toward goal  PT Treatment Precautions/Restrictions  Precautions Precautions: Fall;Sternal Required Braces or Orthoses: No Restrictions Weight Bearing Restrictions: No Mobility (including Balance) Bed Mobility Rolling Left: 1: +2 Total assist;Patient percentage (comment) (pt. = 60%) Rolling Left Details (indicate cue type and reason): Patient needed cues for log rolll for sternal precautions but did assist more than yesterday.  HOB was raised to 30 degrees. Left Sidelying to Sit: HOB elevated (comment degrees);1: +2 Total assist;Patient percentage (comment);With rails (HOB at 30 degrees, pt. = 60%) Left Sidelying to Sit Details (indicate cue type and reason): cues needed for technique Sit to Supine - Left: 1: +2 Total assist;Patient percentage (comment);With rail;HOB flat (pt. = 70%) Sit to Supine  - Left Details (indicate cue type and reason): Needed assist for LEs and cues for technique Transfers Sit to Stand: 4: Min assist;From elevated surface;From bed;Without upper extremity assist (pt. hugged pillow) Sit to Stand Details (indicate cue type and reason): pt. remembers to hug pilllow. Stand to Sit: 4: Min assist;To bed;To elevated surface;Without upper extremity assist Stand to Sit Details: Continues to remember to ask for pillow and controls descent Ambulation/Gait Ambulation/Gait Assistance: 4: Min assist Ambulation/Gait Assistance Details (indicate cue type and reason): Pt. pushed wheelchair to hold equipment.  Pt. with shortened step length bil feet.  Needed cues to stay close to wheelchair.  Pt. increased distance today.   Ambulation Distance (Feet): 75 Feet Assistive device:  (pushed wheelchair for equipment) Gait Pattern: Step-to pattern;Decreased stride length;Trunk flexed Gait velocity: Trunk more upright today and slow cadence continues Stairs: No Wheelchair Mobility Wheelchair Mobility: No  Posture/Postural Control Posture/Postural Control: No significant limitations Balance Balance Assessed: No Exercise    End of Session PT - End of Session Equipment Utilized During Treatment: Gait belt Activity Tolerance: Patient limited by fatigue;Patient limited by pain Patient left: in bed;with call bell in reach;with family/visitor present Nurse Communication: Mobility status for ambulation General Behavior During Session: [facility] for tasks performed Cognition: [facility] for tasks performed  INGOLD,Stephon Weathers 01/17/2011, 4:28 PM [facility] [phone] (pager)

## 2011-01-17 NOTE — Progress Notes (Signed)
6 Days Post-Op Procedure(s) (LRB): CORONARY ARTERY BYPASS GRAFTING (CABG) (N/A) Subjective: Feels a little better today, nausea better  Objective: Vital signs in last 24 hours: Temp:  [97.3 F (36.3 C)-98.1 F (36.7 C)] 97.7 F (36.5 C) (11/21 0740) Pulse Rate:  [70-80] 70  (11/21 0740) Cardiac Rhythm:  [-] Normal sinus rhythm (11/21 0740) Resp:  [17-29] 24  (11/21 0700) BP: (78-149)/(41-96) 149/60 mmHg (11/21 0700) SpO2:  [96 %-100 %] 96 % (11/21 0700) Weight:  [88.9 kg (195 lb 15.8 oz)] 195 lb 15.8 oz (88.9 kg) (11/21 0700)  Hemodynamic parameters for last 24 hours:    Intake/Output from previous day: 11/20 0701 - 11/21 0700 In: 2494.9 [P.O.:1380; I.V.:806.9; IV Piggyback:308] Out: 2856 [Urine:2855; Stool:1] Intake/Output this shift:    General appearance: alert and no distress Heart: regular rate and rhythm Lungs: diminished breath sounds bibasilar Wound: intact  Lab Results:  Basename 01/17/11 0359 01/16/11 0430  WBC 4.2 5.9  HGB 8.5* 8.8*  HCT 25.3* 25.4*  PLT 108* 108*   BMET:  Basename 01/17/11 0359 01/16/11 0430  NA 136 135  K 3.7 3.9  CL 97 98  CO2 30 28  GLUCOSE 70 89  BUN 41* 47*  CREATININE 0.95 0.98  CALCIUM 8.2* 8.4    PT/INR: No results found for this basename: LABPROT,INR in the last 72 hours ABG    Component Value Date/Time   PHART 7.380 01/15/2011 1942   HCO3 26.1* 01/15/2011 1942   TCO2 27 01/15/2011 1942   ACIDBASEDEF 1.0 01/14/2011 1514   O2SAT 96.0 01/15/2011 1942   CBG (last 3)   Basename 01/17/11 0743 01/17/11 0549 01/17/11 0520  GLUCAP 79 72 64*    Assessment/Plan: S/P Procedure(s) (LRB): CORONARY ARTERY BYPASS GRAFTING (CABG) (N/A) Mobilize Diuresis Diabetes control Change amiodarone to PO   LOS: 12 days    Marik Sedore C 01/17/2011

## 2011-01-17 NOTE — Progress Notes (Signed)
SUBJECTIVE:   Maintaining NSR.  Weak but improved.  Doing more PT.  No acute dyspnea or pain.  PHYSICAL EXAM Filed Vitals:   01/17/11 1200 01/17/11 1300 01/17/11 1400 01/17/11 1500  BP: 133/57 133/53 136/45 112/60  Pulse: 70 70 70   Temp:      TempSrc:      Resp: 23 22 26    Height:      Weight:      SpO2: 95% 95% 95%    General:  Weak but improving, no distress Lungs:  Clear Heart:  RRR, no rub Abdomen:  Decreased bowel sounds Extremities:  Diffuse mild edema  LABS: Lab Results  Component Value Date   CKTOTAL 559* 01/15/2011   CKMB 7.3* 01/15/2011   TROPONINI 8.88* 01/15/2011   Results for orders placed during the hospital encounter of 01/05/11 (from the past 24 hour(s))  GLUCOSE, CAPILLARY     Status: Normal   Collection Time   01/16/11  4:14 PM      Component Value Range   Glucose-Capillary 86  70 - 99 (mg/dL)   Comment 1 Documented in Chart     Comment 2 Notify RN    GLUCOSE, CAPILLARY     Status: Normal   Collection Time   01/16/11  7:29 PM      Component Value Range   Glucose-Capillary 92  70 - 99 (mg/dL)   Comment 1 Documented in Chart     Comment 2 Notify RN    GLUCOSE, CAPILLARY     Status: Normal   Collection Time   01/17/11 12:22 AM      Component Value Range   Glucose-Capillary 70  70 - 99 (mg/dL)  BASIC METABOLIC PANEL     Status: Abnormal   Collection Time   01/17/11  3:59 AM      Component Value Range   Sodium 136  135 - 145 (mEq/L)   Potassium 3.7  3.5 - 5.1 (mEq/L)   Chloride 97  96 - 112 (mEq/L)   CO2 30  19 - 32 (mEq/L)   Glucose, Bld 70  70 - 99 (mg/dL)   BUN 41 (*) 6 - 23 (mg/dL)   Creatinine, Ser 1.91  0.50 - 1.10 (mg/dL)   Calcium 8.2 (*) 8.4 - 10.5 (mg/dL)   GFR calc non Af Amer 63 (*) >90 (mL/min)   GFR calc Af Amer 73 (*) >90 (mL/min)  CBC     Status: Abnormal   Collection Time   01/17/11  3:59 AM      Component Value Range   WBC 4.2  4.0 - 10.5 (K/uL)   RBC 2.76 (*) 3.87 - 5.11 (MIL/uL)   Hemoglobin 8.5 (*) 12.0 - 15.0  (g/dL)   HCT 47.8 (*) 29.5 - 46.0 (%)   MCV 91.7  78.0 - 100.0 (fL)   MCH 30.8  26.0 - 34.0 (pg)   MCHC 33.6  30.0 - 36.0 (g/dL)   RDW 62.1  30.8 - 65.7 (%)   Platelets 108 (*) 150 - 400 (K/uL)  GLUCOSE, CAPILLARY     Status: Abnormal   Collection Time   01/17/11  4:18 AM      Component Value Range   Glucose-Capillary 69 (*) 70 - 99 (mg/dL)  GLUCOSE, CAPILLARY     Status: Abnormal   Collection Time   01/17/11  5:20 AM      Component Value Range   Glucose-Capillary 64 (*) 70 - 99 (mg/dL)  GLUCOSE, CAPILLARY  Status: Normal   Collection Time   01/17/11  5:49 AM      Component Value Range   Glucose-Capillary 72  70 - 99 (mg/dL)  GLUCOSE, CAPILLARY     Status: Normal   Collection Time   01/17/11  7:43 AM      Component Value Range   Glucose-Capillary 79  70 - 99 (mg/dL)   Comment 1 Documented in Chart     Comment 2 Notify RN    GLUCOSE, CAPILLARY     Status: Abnormal   Collection Time   01/17/11 11:30 AM      Component Value Range   Glucose-Capillary 101 (*) 70 - 99 (mg/dL)   Comment 1 Documented in Chart     Comment 2 Notify RN      Intake/Output Summary (Last 24 hours) at 01/17/11 1519 Last data filed at 01/17/11 1500  Gross per 24 hour  Intake 2040.23 ml  Output   2895 ml  Net -854.77 ml    ASSESSMENT AND PLAN:  Principal Problem:  *Unstable angina Active Problems:   Coronary atherosclerosis of native coronary artery:  Post CABG  Atrial fib:  Now in NSR on amio.  Tolerating po. Paced rhythm at 70bpm.  Reduced EF:  Will titrate afterload reduction as BP allows.  We will follow as needed.   [name] 01/17/2011 3:19 PM

## 2011-01-17 NOTE — Plan of Care (Signed)
Problem: Phase III Progression Outcomes Goal: O2 Sat remains > or equal to 90% on room air Feel Inpatient Rehab will benefit pt.  Thanks.  [facility] [phone] (pager)

## 2011-01-17 NOTE — Consult Note (Signed)
Physical Medicine and Rehabilitation Consult Reason for Consult:decond after CABG Referring Phsyician: Dr. Zenaida Niece Tright [name] is an 60 y.o. female.   HPI: 60 year old white female with a history of diabetes mellitus admitted 68 5 with a history of coronary artery disease and angina. Underwent cardiac catheterization that demonstrated severe three-vessel disease. Underwent coronary artery bypass grafting x4 on 11/15 per Dr. Zenaida Niece      tright. Advise routine sternal precautions. Postoperative anemia and transfused with lattest hemoglobin 8.5. Bedside swallowing evaluation advise regular diet. Physical therapy evaluation completed 11/20 requiring total assist to ambulate 20 feet pushing a wheelchair. Maintained on subcutaneous Lovenox for deep vein thrombosis prophylaxis. She was seen by inpatient rehabilitation services at direct recommendations of physical therapy to consider inpatient rehabilitation services.  Review of Systems  Constitutional: Positive for malaise/fatigue.  Respiratory: Positive for cough. Negative for shortness of breath.   Cardiovascular: Negative for chest pain.  Gastrointestinal: Positive for constipation. Negative for nausea.  Genitourinary: Negative for dysuria.  Musculoskeletal: Positive for myalgias.  Skin: Negative.   Neurological: Negative for dizziness and headaches.  Psychiatric/Behavioral: Positive for depression.   Past Medical History  Diagnosis Date  . Coronary atherosclerosis of native coronary artery     BMS circ 2001, DES LAD 2005, DES LAD and RCA 2008  . Drug allergy     Allergy to Plavix (hives) - took Ticlid in past  . Essential hypertension, benign   . Mixed hyperlipidemia   . Type 2 diabetes mellitus   . Breast cancer     Chemo and mastectomy  . Angina   . Myocardial infarction ~ 2002  . Diabetes mellitus   . Shortness of breath 01/05/11     "w/exertion"  . Arthritis     rheumatoid  . Depression   . Peripheral neuropathy     "from  diabetes"  . Peptic ulcer disease ~ 1975   Past Surgical History  Procedure Date  . Right mastectomy   . Mastectomy 1988    right  . Cholecystectomy 1969  . Abdominal hysterectomy 1989  . Tonsillectomy and adenoidectomy 1979  . Appendectomy 1969  . Tubal ligation 1972  . Coronary angioplasty with stent placement 4 Times    "had 2 stents each time; last time 2010"  . Cardiac catheterization 01/05/11    "no stents"   History reviewed. No pertinent family history. Social History:  reports that she quit smoking about 3 years ago. Her smoking use included Cigarettes. She has a 20 pack-year smoking history. She has never used smokeless tobacco. She reports that she does not drink alcohol or use illicit drugs. Allergies:  Allergies  Allergen Reactions  . Clopidogrel Bisulfate Hives  . Codeine Nausea And Vomiting  . Morphine Nausea And Vomiting   Medications Prior to Admission  Medication Dose Route Frequency Provider Last Rate Last Dose  . 0.45 % sodium chloride infusion   Intravenous Continuous Rowe Clack, PA 20 mL/hr at 01/17/11 1300    . 0.9 %  sodium chloride infusion  1 mL/kg/hr Intravenous Continuous Rollene Rotunda, MD      . 0.9 %  sodium chloride infusion   Intravenous Continuous Rowe Clack, PA      . acetaminophen (TYLENOL) solution 650 mg  650 mg Per Tube NOW Rowe Clack, PA   649.6 mg at 01/11/11 2030   Or  . acetaminophen (TYLENOL) suppository 650 mg  650 mg Rectal NOW Rowe Clack, PA      .  acetaminophen (TYLENOL) tablet 1,000 mg  1,000 mg Oral Q6H Rowe Clack, PA   1,000 mg at 01/16/11 2356   Or  . acetaminophen (TYLENOL) solution 975 mg  975 mg Per Tube Q6H Wayne E Gold, PA      . albumin human 5 % solution 12.5 g  12.5 g Intravenous Once Purcell Nails, MD   12.5 g at 01/14/11 1200  . albumin human 5 % solution 12.5 g  12.5 g Intravenous Once Purcell Nails, MD   12.5 g at 01/14/11 1100  . albumin human 5 % solution 250 mL  250 mL Intravenous Q15 min PRN Rowe Clack, PA   250 mL at 01/14/11 1130  . aminocaproic acid (AMICAR) 10 g in sodium chloride 0.9 % 100 mL infusion   Intravenous To OR Peter Swaziland, MD      . amiodarone (CORDARONE) 1.8 mg/mL load via infusion 150 mg  150 mg Intravenous Once Purcell Nails, MD 333 mL/hr at 01/14/11 1115 150 mg at 01/14/11 1115   And  . amiodarone (CORDARONE) 450 mg in dextrose 5 % 250 mL infusion  60 mg/hr Intravenous Continuous Purcell Nails, MD 33.3 mL/hr at 01/14/11 1629 60 mg/hr at 01/14/11 1629   And  . amiodarone (CORDARONE) 450 mg in dextrose 5 % 250 mL infusion  30 mg/hr Intravenous Continuous Purcell Nails, MD 16.7 mL/hr at 01/15/11 1000 30 mg/hr at 01/15/11 1000  . amiodarone (CORDARONE) 150 mg in dextrose 5 % 100 mL bolus  150 mg Intravenous Once Purcell Nails, MD   150 mg at 01/14/11 1045  . amiodarone (CORDARONE) 450 mg in dextrose 5 % 250 mL infusion  30 mg/hr Intravenous Continuous Loreli Slot, MD   30 mg/hr at 01/17/11 1200  . amiodarone (PACERONE) tablet 400 mg  400 mg Oral BID Loreli Slot, MD   400 mg at 01/17/11 1038  . aspirin chewable tablet 324 mg  324 mg Oral Pre-Cath Jonelle Sidle, MD   324 mg at 01/05/11 1343  . aspirin EC tablet 325 mg  325 mg Oral Daily Rowe Clack, [place]   325 mg at 01/17/11 1037   Or  . aspirin chewable tablet 324 mg  324 mg Per Tube Daily Rowe Clack, PA   324 mg at 01/16/11 1000  . bisacodyl (DULCOLAX) EC tablet 10 mg  10 mg Oral Daily Rowe Clack, PA   10 mg at 01/16/11 1000   Or  . bisacodyl (DULCOLAX) suppository 10 mg  10 mg Rectal Daily Rowe Clack, PA      . cefTAZidime (FORTAZ) 1 g in dextrose 5 % 50 mL IVPB  1 g Intravenous Q8H Mickeal Skinner, PHARMD   1 g at 01/17/11 7829  . cefUROXime (ZINACEF) 1.5 g in dextrose 5 % 50 mL IVPB  1.5 g Intravenous To OR Peter Swaziland, MD   1.5 g at 01/11/11 1415  . cefUROXime (ZINACEF) 1.5 g in dextrose 5 % 50 mL IVPB  1.5 g Intravenous Q12H Wayne E Gold, PA   1.5 g at 01/13/11 1013  .  chlorhexidine (HIBICLENS) 4 % liquid 4 application  60 mL Topical Once Elson Clan, PHARMD   4 application at 01/10/11 2120  . chlorhexidine (HIBICLENS) 4 % liquid 4 application  60 mL Topical Once Elson Clan, PHARMD   4 application at 01/11/11 0400  . chlorhexidine (PERIDEX) 0.12 % solution  15 mL at 01/15/11 0807  . dexmedetomidine (PRECEDEX) 400 mcg in sodium chloride 0.9 % 100 mL infusion  0.1-0.7 mcg/kg/hr Intravenous To OR Peter Swaziland, MD 0.2 mL/hr at 01/11/11 1835 0.7 mcg/hr at 01/11/11 1835  . dextromethorphan-guaiFENesin (MUCINEX DM) 30-600 MG per 12 hr tablet 1 tablet  1 tablet Oral BID PRN Kathlee Nations Trigt III, MD      . diazepam (VALIUM) tablet 5 mg  5 mg Oral On Call Jonelle Sidle, MD   5 mg at 01/05/11 1344  . diltiazem (CARDIZEM) 100 mg in dextrose 5 % 100 mL infusion  10 mg/hr Intravenous Continuous Kathlee Nations Trigt III, MD   5 mg/hr at 01/16/11 0500  . diltiazem (CARDIZEM) 100 MG injection        100 mg at 01/15/11 0946  . docusate sodium (COLACE) capsule 200 mg  200 mg Oral Daily Rowe Clack, PA   200 mg at 01/16/11 1000  . DOPamine (INTROPIN) 800 mg in dextrose 5 % 250 mL infusion  2-20 mcg/kg/min Intravenous To OR Peter [place]azil[place], MD      . enoxaparin (LOVENOX) injection 30 mg  30 mg Subcutaneous Q24H Kathlee Nations Trigt III, MD   30 mg at 01/16/11 2137  . EPINEPHrine (ADRENALIN) 4,000 mcg in dextrose 5 % 250 mL infusion  0.5-20 mcg/min Intravenous To OR Peter Swaziland, MD      . fentaNYL (SUBLIMAZE) 0.05 MG/ML injection           . furosemide (LASIX) 10 MG/ML injection        40 mg at 01/15/11 1351  . furosemide (LASIX) injection 40 mg  40 mg Intravenous Once Purcell Nails, MD   40 mg at 01/14/11 1724  . furosemide (LASIX) injection 40 mg  40 mg Intravenous BID Kathlee Nations Trigt III, MD   40 mg at 01/17/11 0827  . heparin 2,500 Units, papaverine 30 mg in electrolyte-148 (PLASMALYTE-148) 500 mL irrigation   Irrigation To OR Peter Swaziland, MD      .  heparin 2-0.9 UNIT/ML-% infusion           . insulin aspart (novoLOG) injection 0-20 Units  0-20 Units Subcutaneous TID WC Loreli Slot, MD      . insulin aspart (novoLOG) injection 0-24 Units  0-24 Units Subcutaneous Q2H Purcell Nails, MD      . insulin aspart (novoLOG) injection 4 Units  4 Units Subcutaneous TID WC Loreli Slot, MD      . insulin glargine (LANTUS) injection 75 Units  75 Units Subcutaneous QHS Loreli Slot, MD      . insulin regular (HUMULIN R,NOVOLIN R) 1 Units/mL in sodium chloride 0.9 % 100 mL infusion   Intravenous To OR Peter Swaziland, MD      . insulin regular (HUMULIN R,NOVOLIN R) 1 Units/mL in sodium chloride 0.9 % 100 mL infusion   Intravenous Continuous PRN Purcell Nails, MD   0.7 Units/hr at 01/15/11 2300  . lactated ringers infusion 500 mL  500 mL Intravenous Once PRN Rowe Clack, PA      . lidocaine (cardiac) 100 mg/25ml (XYLOCAINE) 20 MG/ML injection 2% 100 mg  100 mg Intravenous Once Kathlee Nations Trigt III, MD   100 mg at 01/15/11 0930  . lidocaine (XYLOCAINE) 1 % injection           . loperamide (IMODIUM) capsule 4 mg  4 mg Oral Once PRN Rollene Rotunda, MD      .  magnesium sulfate 4 g in dextrose 5 % 100 mL infusion  4 g Intravenous Once Rowe Clack, PA 20 mL/hr at 01/11/11 2000 4 g at 01/11/11 2000  . magnesium sulfate 40 MG/ML IVPB           . metoprolol (LOPRESSOR) injection 2.5-5 mg  2.5-5 mg Intravenous Q2H PRN Rowe Clack, PA   5 mg at 01/15/11 0943  . metoprolol tartrate (LOPRESSOR) tablet 12.5 mg  12.5 mg Oral BID Rowe Clack, PA   12.5 mg at 01/17/11 1037   Or  . metoprolol tartrate (LOPRESSOR) 25 mg/10 mL oral suspension 12.5 mg  12.5 mg Per Tube BID Rowe Clack, PA      . midazolam (VERSED) 2 MG/2ML injection           . midazolam (VERSED) 2 MG/2ML injection           . nitroGLYCERIN (NTG ON-CALL) 0.2 mg/mL injection           . nitroGLYCERIN 0.2 mg/mL in dextrose 5 % infusion  2-200 mcg/min Intravenous To OR Peter [place]az[place], MD  128 mL/hr at 01/11/11 1830 5 mcg/kg/min at 01/11/11 1830  . ondansetron (ZOFRAN) injection 4 mg  4 mg Intravenous Q6H PRN Rowe Clack, PA   4 mg at 01/16/11 0120  . pantoprazole (PROTONIX) EC tablet 40 mg  40 mg Oral Q1200 Wayne E Gold, PA   40 mg at 01/17/11 1310  . PARoxetine (PAXIL) tablet 20 mg  20 mg Oral QAM Rowe Clack, PA   20 mg at 01/17/11 0827  . phenylephrine (NEO-SYNEPHRINE) 20,000 mcg in dextrose 5 % 250 mL infusion  30-200 mcg/min Intravenous To OR Peter Swazil[place], MD      . potassium chloride 10 mEq in 50 mL *CENTRAL LINE* IVPB  10 mEq Intravenous Q1 Hr x 3 Wayne E Gold, PA   10 mEq at 01/11/11 2200  . potassium chloride 10 mEq in 50 mL *CENTRAL LINE* IVPB  10 mEq Intravenous Q1 Hr x 2 Kathlee Nations Trigt III, MD   10 mEq at 01/16/11 1100  . potassium chloride 10 mEq in 50 mL *CENTRAL LINE* IVPB  10 mEq Intravenous Q1 Hr x 3 Kathlee Nations Trigt III, MD   10 mEq at 01/17/11 0753  . potassium chloride injection 80 mEq  80 mEq Other To OR Peter Swaziland, MD      . promethazine [facility]) injection 12.5 mg  12.5 mg Intravenous Q6H PRN Kathlee Nations Trigt III, MD      . simvastatin (ZOCOR) tablet 20 mg  20 mg Oral QHS Rowe Clack, PA   20 mg at 01/16/11 2138  . sodium bicarbonate 1 mEq/mL injection           . sodium bicarbonate injection 50 mEq  50 mEq Intravenous Once Alleen Borne, MD   50 mEq at 01/12/11 0301  . sodium chloride 0.9 % injection 10 mL  10 mL Intracatheter PRN Kathlee Nations Trigt III, MD      . temazepam (RESTORIL) capsule 15 mg  15 mg Oral Once PRN Kathlee Nations Trigt III, MD      . traMADol Janean Sark) tablet 50 mg  50 mg Oral Q6H PRN Kathlee Nations Trigt III, MD   50 mg at 01/17/11 0827  . vancomycin (VANCOCIN) 1,000 mg in sodium chloride 0.9 % 100 mL IVPB  1,000 mg Intravenous Once Rowe Clack, PA   1,000 mg at 01/12/11 0207  .  vancomycin (VANCOCIN) 1,250 mg in sodium chloride 0.9 % 250 mL IVPB  1,250 mg Intravenous To OR Peter Swazil[place], MD   1,250 mg at 01/11/11 1400  . vancomycin (VANCOCIN)  IVPB 1000 mg/200 mL premix  1,000 mg Intravenous Q12H Kathlee Nations Trigt III, MD   1,000 mg at 01/13/11 1330  . DISCONTD: 0.9 %  sodium chloride infusion   Intravenous Continuous Jonelle Sidle, MD 75 mL/hr at 01/05/11 1346    . DISCONTD: 0.9 %  sodium chloride infusion  250 mL Intravenous Continuous Rowe Clack, PA      . DISCONTD: acetaminophen (TYLENOL) tablet 650 mg  650 mg Oral Q4H PRN Dayna N Dunn, PA   650 mg at 01/09/11 1037  . DISCONTD: ALPRAZolam Prudy Feeler) tablet 0.25-0.5 mg  0.25-0.5 mg Oral Q4H PRN Kathlee Nations Trigt III, MD      . DISCONTD: alum & mag hydroxide-simeth (MAALOX/MYLANTA) 200-200-20 MG/5ML suspension 15-30 mL  15-30 mL Oral Q2H PRN Rollene Rotunda, MD      . DISCONTD: amiodarone (CORDARONE) 150 mg in dextrose 5 % 100 mL bolus  150 mg Intravenous Once Kathlee Nations Trigt III, MD      . DISCONTD: aspirin tablet 325 mg  325 mg Oral Daily Rollene Rotunda, MD   325 mg at 01/13/11 1011  . DISCONTD: bisacodyl (DULCOLAX) EC tablet 5 mg  5 mg Oral Once Kathlee Nations Trigt III, MD      . DISCONTD: cefTAZidime (FORTAZ) injection 1 g  1 g Intramuscular Q8H Kathlee Nations Trigt III, MD      . DISCONTD: cefUROXime (ZINACEF) 1.5 g in dextrose 5 % 50 mL IVPB  1.5 g Intravenous To OR Kathlee Nations Trigt III, MD      . DISCONTD: cefUROXime (ZINACEF) 750 mg in dextrose 5 % 50 mL IVPB  750 mg Intravenous To OR Peter Swaziland, MD      . DISCONTD: chlorhexidine (HIBICLENS) 4 % liquid 4 application  60 mL Topical Once Kathlee Nations Trigt III, MD      . DISCONTD: chlorhexidine (HIBICLENS) 4 % liquid 4 application  60 mL Topical Once Kathlee Nations Trigt III, MD      . DISCONTD: chlorhexidine (HIBICLENS) 4 % liquid 4 application  60 mL Topical Once Kathlee Nations Trigt III, MD      . DISCONTD: dexmedetomidine (PRECEDEX) 200 mcg in sodium chloride 0.9 % 50 mL infusion  0.4-1.2 mcg/kg/hr Intravenous Titrated Kathlee Nations Trigt III, MD      . DISCONTD: dexmedetomidine (PRECEDEX) 200 mcg in sodium chloride 0.9 % 50 mL infusion  0.1-0.7  mcg/kg/hr Intravenous Continuous Rowe Clack, PA 14.4 mL/hr at 01/11/11 2226 0.7 mcg/kg/hr at 01/11/11 2226  . DISCONTD: diltiazem (CARDIZEM) 1 mg/mL load via infusion 10 mg  10 mg Intravenous Once Purcell Nails, MD      . DISCONTD: diltiazem (CARDIZEM) 100 mg in dextrose 5 % 100 mL infusion  5-15 mg/hr Intravenous Continuous Purcell Nails, MD      . DISCONTD: diltiazem (CARDIZEM) 100 MG injection           . DISCONTD: DOPamine (INTROPIN) 800 mg in dextrose 5 % 250 mL infusion  0-10 mcg/kg/min Intravenous Titrated Rowe Clack, PA      . DISCONTD: DOPamine (INTROPIN) 800 mg in dextrose 5 % 250 mL infusion  2.5 mcg/kg/min Intravenous Titrated Kathlee Nations Trigt III, MD 4 mL/hr at 01/13/11 1000 2.484 mcg/kg/min at 01/13/11 1000  . DISCONTD: DOPamine (INTROPIN) 800 mg in  dextrose 5 % 250 mL infusion  2.5 mcg/kg/min Intravenous Titrated Kathlee Nations Trigt III, MD 3.3 mL/hr at 01/16/11 1300 2 mcg/kg/min at 01/16/11 1300  . DISCONTD: enoxaparin (LOVENOX) injection 40 mg  40 mg Subcutaneous Q24H Purcell Nails, MD   40 mg at 01/14/11 1856  . DISCONTD: famotidine (PEPCID) IVPB 20 mg  20 mg Intravenous Q12H Rowe Clack, PA      . DISCONTD: fentaNYL (SUBLIMAZE) injection 50 mcg  50 mcg Intravenous Q2H PRN Rowe Clack, PA   25 mcg at 01/16/11 0644  . DISCONTD: fentaNYL (SUBLIMAZE) injection 50-100 mcg  50-100 mcg Intravenous PRN Kerby Nora, MD   100 mcg at 01/15/11 0501  . DISCONTD: furosemide (LASIX) injection 20 mg  20 mg Intravenous Q6H Kathlee Nations Trigt III, MD   20 mg at 01/12/11 1243  . DISCONTD: furosemide (LASIX) injection 40 mg  40 mg Intravenous BID Kathlee Nations Trigt III, MD   40 mg at 01/13/11 1803  . DISCONTD: gabapentin (NEURONTIN) capsule 300 mg  300 mg Oral TID Rollene Rotunda, MD   300 mg at 01/13/11 2304  . DISCONTD: guaiFENesin-dextromethorphan (ROBITUSSIN DM) 100-10 MG/5ML syrup 15 mL  15 mL Oral Q4H PRN Rollene Rotunda, MD      . DISCONTD: hemostatic agents    PRN Kathlee Nations Suann Larry, MD   1  application at 01/11/11 1833  . DISCONTD: hemostatic agents    PRN Kathlee Nations Suann Larry, MD   1 application at 01/11/11 1835  . DISCONTD: hemostatic agents    PRN Kathlee Nations Suann Larry, MD   1 application at 01/11/11 1837  . DISCONTD: hemostatic agents    PRN Kathlee Nations Suann Larry, MD   1 application at 01/11/11 1838  . DISCONTD: heparin ADULT infusion 100 units/mL (25000 units/250 mL)  1,000 Units/hr Intravenous Continuous Chinita Greenland, PHARMD 10 mL/hr at 01/06/11 0033 1,000 Units/hr at 01/06/11 0033  . DISCONTD: heparin ADULT infusion 100 units/mL (25000 units/250 mL)  1,600 Units/hr Intravenous Continuous Mickeal Skinner, PHARMD 16 mL/hr at 01/11/11 0155 1,600 Units/hr at 01/11/11 0155  . DISCONTD: influenza  inactive virus vaccine (FLUZONE/FLUARIX) injection 0.5 mL  0.5 mL Intramuscular Tomorrow-1000 Peter Swaziland, MD      . DISCONTD: insulin aspart (novoLOG) injection 0-15 Units  0-15 Units Subcutaneous TID WC Rollene Rotunda, MD   3 Units at 01/11/11 [phone]  . DISCONTD: insulin aspart (novoLOG) injection 0-24 Units  0-24 Units Subcutaneous Q4H Kerin Perna III, MD   2 Units at 01/12/11 1303  . DISCONTD: insulin aspart (novoLOG) injection 0-24 Units  0-24 Units Subcutaneous Q4H Purcell Nails, MD   2 Units at 01/16/11 1230  . DISCONTD: insulin aspart (novoLOG) injection 0-24 Units  0-24 Units Subcutaneous Q4H Purcell Nails, MD      . DISCONTD: insulin aspart (novoLOG) injection 84 Units  84 Units Subcutaneous TID WC Janice Coffin, RPH   84 Units at 01/10/11 2121  . DISCONTD: insulin glargine (LANTUS) injection 100 Units  100 Units Subcutaneous BID Rollene Rotunda, MD   100 Units at 01/06/11 2140  . DISCONTD: insulin glargine (LANTUS) injection 25 Units  25 Units Subcutaneous QAM Kathlee Nations Suann Larry, MD   25 Units at 01/12/11 [phone]  . DISCONTD: insulin glargine (LANTUS) injection 25 Units  25 Units Subcutaneous BID Kathlee Nations Trigt III, MD   25 Units at 01/13/11 1114  . DISCONTD: insulin  glargine (LANTUS) injection 50 Units  50 Units Subcutaneous  BID Rollene Rotunda, MD   50 Units at 01/10/11 2228  . DISCONTD: insulin glargine (LANTUS) injection 50 Units  50 Units Subcutaneous BID Rowe Clack, [place]      . DISCONTD: insulin glargine (LANTUS) injection 50 Units  50 Units Subcutaneous Once Purcell Nails, MD      . DISCONTD: insulin glargine (LANTUS) injection 75 Units  75 Units Subcutaneous BID Purcell Nails, MD   75 Units at 01/16/11 2137  . DISCONTD: insulin lispro (HUMALOG) injection 84 Units  84 Units Subcutaneous TID AC Rollene Rotunda, MD      . DISCONTD: insulin regular (HUMULIN R,NOVOLIN R) 1 Units/mL in sodium chloride 0.9 % 100 mL infusion   Intravenous Continuous Rowe Clack, PA 7.7 mL/hr at 01/14/11 1628 7.7 Units/hr at 01/14/11 1628  . DISCONTD: insulin regular (HUMULIN R,NOVOLIN R) 1 Units/mL in sodium chloride 0.9 % 100 mL infusion   Intravenous Continuous PRN Kathlee Nations Suann Larry, MD      . DISCONTD: lactated ringers infusion   Intravenous Continuous Kerby Nora, MD 50 mL/hr at 01/11/11 1245    . DISCONTD: lactated ringers infusion   Intravenous Continuous Rowe Clack, PA 20 mL/hr at 01/13/11 1000    . DISCONTD: lisinopril (PRINIVIL,ZESTRIL) tablet 10 mg  10 mg Oral Daily Rollene Rotunda, MD   10 mg at 01/10/11 1000  . DISCONTD: lisinopril (PRINIVIL,ZESTRIL) tablet 10 mg  10 mg Oral Daily Rowe Clack, PA   10 mg at 01/13/11 1011  . DISCONTD: loperamide (IMODIUM) capsule 2 mg  2 mg Oral PRN Rollene Rotunda, MD      . DISCONTD: magnesium hydroxide (MILK OF MAGNESIA) suspension 30 mL  30 mL Oral Daily PRN Rollene Rotunda, MD      . DISCONTD: magnesium sulfate 50 % injection 40 mEq  40 mEq Other To OR Peter Swaziland, MD      . DISCONTD: metFORMIN (GLUCOPHAGE) tablet 500 mg  500 mg Oral BID WC Rollene Rotunda, MD   500 mg at 01/10/11 1727  . DISCONTD: metFORMIN (GLUCOPHAGE) tablet 500 mg  500 mg Oral BID WC Rowe Clack, PA      . DISCONTD: metoprolol (LOPRESSOR) tablet 50 mg   50 mg Oral BID Rollene Rotunda, MD   50 mg at 01/11/11 1156  . DISCONTD: metoprolol tartrate (LOPRESSOR) tablet 12.5 mg  12.5 mg Oral Once Kathlee Nations Trigt III, MD      . DISCONTD: midazolam (VERSED) injection 1-2 mg  1-2 mg Intravenous PRN Kerby Nora, MD   2 mg at 01/14/11 1115  . DISCONTD: midazolam (VERSED) injection 2 mg  2 mg Intravenous Q1H PRN Rowe Clack, PA   2 mg at 01/14/11 2132  . DISCONTD: nitroGLYCERIN 0.2 mg/mL in dextrose 5 % infusion  0-100 mcg/min Intravenous Continuous Rowe Clack, PA      . DISCONTD: norepinephrine (LEVOPHED) 8 mg in dextrose 5 % 250 mL infusion  2-50 mcg/min Intravenous Titrated Purcell Nails, MD      . DISCONTD: ondansetron (ZOFRAN) injection 4 mg  4 mg Intravenous Q6H PRN Jonelle Sidle, MD      . DISCONTD: ondansetron [facility]) injection 4 mg  4 mg Intravenous Q6H PRN Rollene Rotunda, MD      . DISCONTD: pantoprazole (PROTONIX) injection 40 mg  40 mg Intravenous Q24H Purcell Nails, MD      . DISCONTD: papaverine injection    PRN Kathlee Nations Suann Larry, MD   [phone]  mg at 01/11/11 1831  . DISCONTD: PARoxetine (PAXIL) tablet 20 mg  20 mg Oral QAM Rollene Rotunda, MD   20 mg at 01/11/11 [phone]  . DISCONTD: phenylephrine (NEO-SYNEPHRINE) 20,000 mcg in dextrose 5 % 250 mL infusion  0-100 mcg/min Intravenous Continuous Rowe Clack, PA 0 mL/hr at 01/12/11 1013 0 mcg/min at 01/12/11 1013  . DISCONTD: phenylephrine (NEO-SYNEPHRINE) 20,000 mcg in dextrose 5 % 250 mL infusion  30-200 mcg/min Intravenous Titrated Purcell Nails, MD 7.5 mL/hr at 01/15/11 1500 10 mcg/min at 01/15/11 1500  . DISCONTD: potassium chloride (KLOR-CON) CR tablet 20 mEq  20 mEq Oral BID Purcell Nails, MD   20 mEq at 01/13/11 2306  . DISCONTD: pramoxine-mineral oil-zinc (TUCK'S) rectal ointment 1 application  1 application Rectal TID PRN Rollene Rotunda, MD      . DISCONTD: simvastatin (ZOCOR) tablet 20 mg  20 mg Oral QHS Rollene Rotunda, MD   20 mg at 01/10/11 2200  . DISCONTD: sodium chloride 0.9  % injection 3 mL  3 mL Intravenous Q12H Rowe Clack, PA      . DISCONTD: sodium chloride 0.9 % injection 3 mL  3 mL Intravenous PRN Rowe Clack, PA      . DISCONTD: Ticagrelor (BRILINTA) tablet 90 mg  90 mg Oral BID Jonelle Sidle, MD      . DISCONTD: traMADol Janean Sark) tablet 50-100 mg  50-100 mg Oral Q4H PRN Rowe Clack, PA   50 mg at 01/13/11 2211  . DISCONTD: vancomycin (VANCOCIN) 1,250 mg in sodium chloride 0.9 % 250 mL IVPB  1,250 mg Intravenous Once Peter Swaziland, MD       No current outpatient prescriptions on file as of 01/17/2011.    Home: Home Living Lives With: Spouse Receives Help From: Family Type of Home: House Home Layout: One level Home Access: Level entry Bathroom Shower/Tub: Tub/shower unit;Curtain Bathroom Toilet: Standard (vanity beside toilet) Bathroom Accessibility: Yes How Accessible: Accessible via walker Home Adaptive Equipment: Hand-held shower hose;Shower chair without back;Walker - standard  Functional History: Prior Function Level of Independence: Independent with homemaking with ambulation Driving: Yes Vocation: On disability Comments: Neuropathy and torn rotator cuffs Functional Status:  Mobility: Bed Mobility Bed Mobility: Yes Rolling Left: 1: +2 Total assist;Patient percentage (comment) (pt = 45%) Rolling Left Details (indicate cue type and reason): Pt. needed cues for log roll for sternal precautions Left Sidelying to Sit: 1: +2 Total assist;Patient percentage (comment);With rails;HOB flat (pt. = 50%) Left Sidelying to Sit Details (indicate cue type and reason): Cues needed for technique Sit to Supine - Left: 1: +2 Total assist;Patient percentage (comment);With rail;HOB flat (pt = 65%) Sit to Supine - Left Details (indicate cue type and reason): Needed assist for LEs and cues for technique Transfers Transfers: Yes Sit to Stand: 1: +2 Total assist;Patient percentage (comment);From bed;Without upper extremity assist (pt. hugged pillow and pt  = 70% for sit to stand) Sit to Stand Details (indicate cue type and reason): Remembered to hold pillow Stand to Sit: 4: Min assist;To bed (doing well controlling descent) Stand to Sit Details: Pt. held pillow without cues for stand to sit Ambulation/Gait Ambulation/Gait: Yes Ambulation/Gait Assistance: 1: +2 Total assist;Patient percentage (comment) (pt. = 70%) Ambulation/Gait Assistance Details (indicate cue type and reason): Pt. pushed wheelchair to hold equipment.  Pt. with shortened step length bil feet.  Needed cues to stay close to wheelchair.  Pt. somewhat lethargic and dizzy therefore limited distance.  Took 10 steps forward and backward.  Ambulation Distance (Feet): 20 Feet Assistive device: Other (Comment) (pushed wheelchair) Gait Pattern: Step-to pattern;Decreased stride length;Trunk flexed Gait velocity: Slow cadence Stairs: No Wheelchair Mobility Wheelchair Mobility: No  ADL:    Cognition: Cognition Arousal/Alertness: Awake/alert Orientation Level: Oriented X4 Cognition Arousal/Alertness: Awake/alert Overall Cognitive Status: Appears within functional limits for tasks assessed Orientation Level: Oriented X4  Blood pressure 133/53, pulse 70, temperature 98.1 F (36.7 C), temperature source Oral, resp. rate 22, height 5\' 5"  (1.651 m), weight 88.9 kg (195 lb 15.8 oz), last menstrual period 01/05/1996, SpO2 95.00%. Physical Exam  Constitutional: She is oriented to person, place, and time. She appears well-developed.  HENT:  Head: Normocephalic.  Neck: Normal range of motion. Neck supple. No thyromegaly present.  Cardiovascular: Normal rate.   No murmur heard. Pulmonary/Chest: Effort normal. She has no wheezes.  Abdominal: She exhibits no distension. There is no tenderness.  Musculoskeletal: She exhibits no edema.  Neurological: She is alert and oriented to person, place, and time.  Skin:       Chest incision without drainage  Psychiatric:       Mood is a bit flat      Results for orders placed during the hospital encounter of 01/05/11 (from the past 24 hour(s))  GLUCOSE, CAPILLARY     Status: Normal   Collection Time   01/16/11  4:14 PM      Component Value Range   Glucose-Capillary 86  70 - 99 (mg/dL)   Comment 1 Documented in Chart     Comment 2 Notify RN    GLUCOSE, CAPILLARY     Status: Normal   Collection Time   01/16/11  7:29 PM      Component Value Range   Glucose-Capillary 92  70 - 99 (mg/dL)   Comment 1 Documented in Chart     Comment 2 Notify RN    GLUCOSE, CAPILLARY     Status: Normal   Collection Time   01/17/11 12:22 AM      Component Value Range   Glucose-Capillary 70  70 - 99 (mg/dL)  BASIC METABOLIC PANEL     Status: Abnormal   Collection Time   01/17/11  3:59 AM      Component Value Range   Sodium 136  135 - 145 (mEq/L)   Potassium 3.7  3.5 - 5.1 (mEq/L)   Chloride 97  96 - 112 (mEq/L)   CO2 30  19 - 32 (mEq/L)   Glucose, Bld 70  70 - 99 (mg/dL)   BUN 41 (*) 6 - 23 (mg/dL)   Creatinine, Ser 1.61  0.50 - 1.10 (mg/dL)   Calcium 8.2 (*) 8.4 - 10.5 (mg/dL)   GFR calc non Af Amer 63 (*) >90 (mL/min)   GFR calc Af Amer 73 (*) >90 (mL/min)  CBC     Status: Abnormal   Collection Time   01/17/11  3:59 AM      Component Value Range   WBC 4.2  4.0 - 10.5 (K/uL)   RBC 2.76 (*) 3.87 - 5.11 (MIL/uL)   Hemoglobin 8.5 (*) 12.0 - 15.0 (g/dL)   HCT 09.6 (*) 04.5 - 46.0 (%)   MCV 91.7  78.0 - 100.0 (fL)   MCH 30.8  26.0 - 34.0 (pg)   MCHC 33.6  30.0 - 36.0 (g/dL)   RDW 40.9  81.1 - 91.4 (%)   Platelets 108 (*) 150 - 400 (K/uL)  GLUCOSE, CAPILLARY     Status: Abnormal   Collection Time  01/17/11  4:18 AM      Component Value Range   Glucose-Capillary 69 (*) 70 - 99 (mg/dL)  GLUCOSE, CAPILLARY     Status: Abnormal   Collection Time   01/17/11  5:20 AM      Component Value Range   Glucose-Capillary 64 (*) 70 - 99 (mg/dL)  GLUCOSE, CAPILLARY     Status: Normal   Collection Time   01/17/11  5:49 AM      Component Value  Range   Glucose-Capillary 72  70 - 99 (mg/dL)  GLUCOSE, CAPILLARY     Status: Normal   Collection Time   01/17/11  7:43 AM      Component Value Range   Glucose-Capillary 79  70 - 99 (mg/dL)   Comment 1 Documented in Chart     Comment 2 Notify RN    GLUCOSE, CAPILLARY     Status: Abnormal   Collection Time   01/17/11 11:30 AM      Component Value Range   Glucose-Capillary 101 (*) 70 - 99 (mg/dL)   Comment 1 Documented in Chart     Comment 2 Notify RN     Dg Chest Port 1 View  01/17/2011  *RADIOLOGY REPORT*  Clinical Data: Shortness of breath.  Postop heart surgery. Respiratory distress.  Follow-up.  PORTABLE CHEST - 1 VIEW  Comparison: 01/16/2011.  Findings: Tip of left PICC terminates in the area of the cavoatrial junction.  No pneumothorax.  Stable moderate enlargement of the cardiac silhouette. The patient has undergone previous median sternotomy and coronary artery bypass grafting. Subsegmental atelectasis in left midlung area unchanged.  Slight blunting of left costophrenic angle consistent with small amount of pleural effusion.  Minimal left basilar atelectasis.  Atelectasis and hazy opacity in the lower portion of the right lung.  Indistinctness of right costophrenic angle probably reflects small amount of right pleural effusion.  No consolidation evident.  IMPRESSION: PICC in place.  Stable cardiac silhouette enlargement.  Post CABG. Stable subsegmental atelectasis in left midlung area.  Small amount of left pleural effusion.  Minimal left basilar atelectasis. Atelectasis and hazy opacity in the lower portion of the right lung.  Indistinctness of right costophrenic angle probably reflects small amount of right pleural effusion.  No consolidation evident. No new lesion is evident.  Original Report Authenticated By: Crawford Givens, M.D.   Dg Chest Port 1 View  01/16/2011  *RADIOLOGY REPORT*  Clinical Data: Shortness of breath.  PORTABLE CHEST - 1 VIEW  Comparison: 01/15/2011.  Findings:  Endotracheal tube, nasogastric tube and right central line has been removed.  Placement of the left PICC line with the tip at the cavoatrial junction/proximal right atrium.  Tiny right apical pneumothorax not excluded.  Attention to this on follow-up.  This may be related to overlying structures rather than pneumothorax.  Cardiomegaly.  Pulmonary vascular congestion/pulmonary edema with pleural effusions.  Consolidation lung bases may be related to pleural effusions and atelectasis although infiltrate not excluded.  Post median sternotomy/CABG.  Atelectatic changes peripheral aspect left mid lung zone.  IMPRESSION: Left PICC line tip cavoatrial junction/proximal right atrium.  Tiny right apical pneumothorax not excluded although this may represent overlying structure.  Attention to this on follow-up.  Cardiomegaly with pulmonary vascular congestion/pulmonary edema.  Consolidation lung bases may reflect pleural fluid with atelectasis although infiltrate not excluded.  Original Report Authenticated By: Fuller Canada, M.D.    Assessment/Plan: Diagnosis: Deconditioning s/p CABG 1. Does the need for close, 24 hr/day medical supervision  in concert with the patient's rehab needs make it unreasonable for this patient to be served in a less intensive setting? Potentially 2. Co-Morbidities requiring supervision/potential complications: diabetic neuropathy, afib, depression 3. Due to bladder management, bowel management, safety, skin/wound care, disease management, medication administration, pain management and patient education, does the patient require 24 hr/day rehab nursing? Potentially 4. Does the patient require coordinated care of a physician, rehab nurse, PT (1-2 hrs/day, 5 days/week) and OT (1-2 hrs/day, 5 days/week) to address physical and functional deficits in the context of the above medical diagnosis(es)? Potentially Addressing deficits in the following areas: balance, bathing, dressing, endurance,  grooming, locomotion, strength, toileting and transferring 5. Can the patient actively participate in an intensive therapy program of at least 3 hrs of therapy per day at least 5 days per week? Potentially 6. The potential for patient to make measurable gains while on inpatient rehab is fair and poor 7. Anticipated functional outcomes upon discharge from inpatients are ? Min A PT, ? Min A OT, 8. Estimated rehab length of stay to reach the above functional goals is: NA 9. Does the patient have adequate social supports to accommodate these discharge functional goals? Potentially 10. Anticipated D/C setting: may need SNF if endurance fails to improve 11. Anticipated post D/C treatments: see below 12. Overall Rehab/Functional Prognosis: fair and poor  RECOMMENDATIONS: This patient's condition is appropriate for continued rehabilitative care in the following setting: SNF Patient has agreed to participate in recommended program. Potentially Note that insurance prior authorization may be required for reimbursement for recommended care.  Comment:Endurance and exercise tolerance currently too low for CIR.  If ready now for hospital D/C then SNF, otherwise rehab RN will f/u on therapy progress to see if activity tolerance improves.  ANGIULLI,DANIEL J. 01/17/2011

## 2011-01-17 NOTE — Progress Notes (Addendum)
CBG: 69  Treatment: 15 GM carbohydrate snack  Symptoms: None  Follow-up CBG: Time:0525 CBG Result:64  Possible Reasons for Event: Inadequate meal intake and Medication regimen: Patient on 75 units lantus BID, meal intake is <50%   Comments/MD notified: Repeat treatment with 15gm CHO snack again. Will recheck at 0600. Patient did have a HS snack of peanut butter and crackers 01/16/11    Dixon, Gayleen Orem

## 2011-01-18 LAB — TYPE AND SCREEN
ABO/RH(D): B POS
Antibody Screen: NEGATIVE
Unit division: 0
Unit division: 0

## 2011-01-18 LAB — BASIC METABOLIC PANEL
CO2: 30 mEq/L (ref 19–32)
Chloride: 96 mEq/L (ref 96–112)
Glucose, Bld: 108 mg/dL — ABNORMAL HIGH (ref 70–99)
Potassium: 4.1 mEq/L (ref 3.5–5.1)
Sodium: 134 mEq/L — ABNORMAL LOW (ref 135–145)

## 2011-01-18 LAB — CBC
HCT: 25.5 % — ABNORMAL LOW (ref 36.0–46.0)
MCH: 30.8 pg (ref 26.0–34.0)
MCV: 92.4 fL (ref 78.0–100.0)
RBC: 2.76 MIL/uL — ABNORMAL LOW (ref 3.87–5.11)
WBC: 4.4 10*3/uL (ref 4.0–10.5)

## 2011-01-18 LAB — GLUCOSE, CAPILLARY
Glucose-Capillary: 72 mg/dL (ref 70–99)
Glucose-Capillary: 82 mg/dL (ref 70–99)

## 2011-01-18 LAB — CULTURE, RESPIRATORY W GRAM STAIN

## 2011-01-18 MED ORDER — FUROSEMIDE 10 MG/ML IJ SOLN
40.0000 mg | Freq: Once | INTRAMUSCULAR | Status: AC
  Administered 2011-01-18: 40 mg via INTRAVENOUS
  Filled 2011-01-18: qty 4

## 2011-01-18 MED ORDER — CARVEDILOL 6.25 MG PO TABS
6.2500 mg | ORAL_TABLET | Freq: Two times a day (BID) | ORAL | Status: DC
Start: 2011-01-19 — End: 2011-01-21
  Administered 2011-01-19 – 2011-01-21 (×5): 6.25 mg via ORAL
  Filled 2011-01-18 (×7): qty 1

## 2011-01-18 MED ORDER — POTASSIUM CHLORIDE CRYS ER 20 MEQ PO TBCR
20.0000 meq | EXTENDED_RELEASE_TABLET | Freq: Every day | ORAL | Status: DC
  Administered 2011-01-18 – 2011-01-20 (×3): 20 meq via ORAL
  Filled 2011-01-18 (×3): qty 1

## 2011-01-18 MED ORDER — INSULIN GLARGINE 100 UNIT/ML ~~LOC~~ SOLN
60.0000 [IU] | Freq: Every day | SUBCUTANEOUS | Status: DC
  Administered 2011-01-18 – 2011-01-22 (×5): 60 [IU] via SUBCUTANEOUS
  Filled 2011-01-18: qty 3

## 2011-01-18 MED ORDER — TRAMADOL HCL 50 MG PO TABS
100.0000 mg | ORAL_TABLET | Freq: Four times a day (QID) | ORAL | Status: DC | PRN
  Administered 2011-01-18 (×3): 50 mg via ORAL
  Administered 2011-01-18 – 2011-01-20 (×3): 100 mg via ORAL
  Filled 2011-01-18: qty 1
  Filled 2011-01-18 (×3): qty 2

## 2011-01-18 MED ORDER — FUROSEMIDE 10 MG/ML IJ SOLN
40.0000 mg | Freq: Every day | INTRAMUSCULAR | Status: DC
  Administered 2011-01-18: 40 mg via INTRAVENOUS
  Filled 2011-01-18: qty 4

## 2011-01-18 MED ORDER — LISINOPRIL 10 MG PO TABS
10.0000 mg | ORAL_TABLET | Freq: Every day | ORAL | Status: DC
  Administered 2011-01-18 – 2011-01-25 (×8): 10 mg via ORAL
  Filled 2011-01-18 (×9): qty 1

## 2011-01-18 NOTE — Progress Notes (Signed)
7 Days Post-Op Procedure(s) (LRB): CORONARY ARTERY BYPASS GRAFTING (CABG) (N/A) Subjective: Feeling a little better  Objective: Vital signs in last 24 hours: Temp:  [97.7 F (36.5 C)-98.3 F (36.8 C)] 98.1 F (36.7 C) (11/22 0726) Pulse Rate:  [68-72] 70  (11/22 0900) Cardiac Rhythm:  [-] Atrial paced (11/22 0800) Resp:  [12-27] 20  (11/22 0900) BP: (112-181)/(38-132) 181/80 mmHg (11/22 0900) SpO2:  [90 %-99 %] 92 % (11/22 0900) Weight:  [88.9 kg (195 lb 15.8 oz)] 195 lb 15.8 oz (88.9 kg) (11/22 0600)  Hemodynamic parameters for last 24 hours:    Intake/Output from previous day: 11/21 0701 - 11/22 0700 In: 1766.4 [P.O.:980; I.V.:578.4; IV Piggyback:208] Out: 2240 [Urine:2240] Intake/Output this shift: Total I/O In: 20 [I.V.:20] Out: 260 [Urine:260]  General appearance: alert and cooperative Heart: regular rate and rhythm, S1, S2 normal, no murmur, click, rub or gallop Lungs: diminished breath sounds bibasilar Abdomen: normal findings: soft, non-tender Extremities: edema mild in legs Wound: incision ok  Lab Results:  Basename 01/18/11 0415 01/17/11 0359  WBC 4.4 4.2  HGB 8.5* 8.5*  HCT 25.5* 25.3*  PLT 89* 108*   BMET:  Basename 01/18/11 0415 01/17/11 0359  NA 134* 136  K 4.1 3.7  CL 96 97  CO2 30 30  GLUCOSE 108* 70  BUN 38* 41*  CREATININE 0.88 0.95  CALCIUM 8.7 8.2*    PT/INR: No results found for this basename: LABPROT,INR in the last 72 hours ABG    Component Value Date/Time   PHART 7.380 01/15/2011 1942   HCO3 26.1* 01/15/2011 1942   TCO2 27 01/15/2011 1942   ACIDBASEDEF 1.0 01/14/2011 1514   O2SAT 96.0 01/15/2011 1942   CBG (last 3)   Basename 01/18/11 0746 01/17/11 2130 01/17/11 1528  GLUCAP 72 102* 105*    Assessment/Plan: S/P Procedure(s) (LRB): CORONARY ARTERY BYPASS GRAFTING (CABG) (N/A) Mobilize Diuresis Diabetes control- Will decrease Lantus to 60 since she has had some hypoglycemia and is not eating very well. Restart  Lisinopril for hypertension and reduced EF. DC foley   LOS: 13 days    Cassidy Tabet K 01/18/2011

## 2011-01-18 NOTE — Progress Notes (Signed)
Patient ID: [name], female   DOB: 01-16-1951, 60 y.o.   MRN: [id number]  Hemodynamically stable in sinus rhythm.  No new problems today.

## 2011-01-19 ENCOUNTER — Inpatient Hospital Stay (HOSPITAL_COMMUNITY): Payer: Managed Care, Other (non HMO)

## 2011-01-19 DIAGNOSIS — I517 Cardiomegaly: Secondary | ICD-10-CM

## 2011-01-19 DIAGNOSIS — IMO0001 Reserved for inherently not codable concepts without codable children: Secondary | ICD-10-CM

## 2011-01-19 DIAGNOSIS — E1165 Type 2 diabetes mellitus with hyperglycemia: Secondary | ICD-10-CM

## 2011-01-19 LAB — GLUCOSE, CAPILLARY
Glucose-Capillary: 91 mg/dL (ref 70–99)
Glucose-Capillary: 94 mg/dL (ref 70–99)
Glucose-Capillary: 123 mg/dL — ABNORMAL HIGH (ref 70–99)

## 2011-01-19 LAB — POCT I-STAT 4, (NA,K, GLUC, HGB,HCT)
Glucose, Bld: 161 mg/dL — ABNORMAL HIGH (ref 70–99)
HCT: 25 % — ABNORMAL LOW (ref 36.0–46.0)

## 2011-01-19 LAB — PRO B NATRIURETIC PEPTIDE: Pro B Natriuretic peptide (BNP): 4151 pg/mL — ABNORMAL HIGH (ref 0–125)

## 2011-01-19 MED ORDER — FUROSEMIDE 10 MG/ML IJ SOLN
40.0000 mg | Freq: Two times a day (BID) | INTRAMUSCULAR | Status: DC
  Administered 2011-01-19 – 2011-01-20 (×3): 40 mg via INTRAVENOUS
  Filled 2011-01-19 (×5): qty 4

## 2011-01-19 NOTE — Progress Notes (Signed)
                   [address]            [name] [address]          [phone]        POD #8 CABGx4  prob periop subendocardial MI w/ mild elevation in enzymes Now stronger,walking  Post op A-Fib required D/C cardioversion  due to low BP and probably caused post-op increase  in enzymes --- now NSR on amiodorone po DM controlled w/ Lantus, SSI Purulent sputum covered w/ Elita Quick for prob pneumonia Will be ready for PCTU soon   Cont lisinopril,coreg, lasix

## 2011-01-19 NOTE — Progress Notes (Signed)
  Echocardiogram 2D Echocardiogram has been performed.  Ona Rathert Lynn Hendrixx Severin, RDCS 01/19/2011, 10:09 AM

## 2011-01-19 NOTE — Progress Notes (Signed)
Evaluated for possible admission.  I spoke with patient.  She is interested in inpatient rehab if needed.  She is doing well today, sitting up in chair, and did well with PT yesterday.  I am waiting on an OT evaluation.  Patient has [employer] and will need PT and OT evaluations for insurance carrier.  Insurance carriers are closed today.  I will follow-up on Monday for progress and plans.  Call me for questions.  Pager [phone]

## 2011-01-19 NOTE — Progress Notes (Signed)
UR Completed.  Kaliq Lege Jane [phone] 01/19/2011  

## 2011-01-20 LAB — CBC
HCT: 25.2 % — ABNORMAL LOW (ref 36.0–46.0)
Hemoglobin: 8.5 g/dL — ABNORMAL LOW (ref 12.0–15.0)
MCH: 30.8 pg (ref 26.0–34.0)
MCHC: 33.7 g/dL (ref 30.0–36.0)
MCV: 91.3 fL (ref 78.0–100.0)
Platelets: 101 10*3/uL — ABNORMAL LOW (ref 150–400)
RBC: 2.76 MIL/uL — ABNORMAL LOW (ref 3.87–5.11)
RDW: 13.6 % (ref 11.5–15.5)
WBC: 5.1 10*3/uL (ref 4.0–10.5)

## 2011-01-20 LAB — GLUCOSE, CAPILLARY: Glucose-Capillary: 104 mg/dL — ABNORMAL HIGH (ref 70–99)

## 2011-01-20 LAB — BASIC METABOLIC PANEL
BUN: 34 mg/dL — ABNORMAL HIGH (ref 6–23)
CO2: 30 mEq/L (ref 19–32)
Calcium: 9.1 mg/dL (ref 8.4–10.5)
Chloride: 93 mEq/L — ABNORMAL LOW (ref 96–112)
Creatinine, Ser: 0.96 mg/dL (ref 0.50–1.10)
GFR calc Af Amer: 72 mL/min — ABNORMAL LOW (ref >90)
GFR calc non Af Amer: 63 mL/min — ABNORMAL LOW (ref >90)
Glucose, Bld: 87 mg/dL (ref 70–99)
Potassium: 4.1 mEq/L (ref 3.5–5.1)
Sodium: 133 mEq/L — ABNORMAL LOW (ref 135–145)

## 2011-01-20 MED ORDER — FUROSEMIDE 40 MG PO TABS
40.0000 mg | ORAL_TABLET | Freq: Every day | ORAL | Status: DC
  Administered 2011-01-21: 40 mg via ORAL
  Filled 2011-01-20: qty 1

## 2011-01-20 MED ORDER — METFORMIN HCL 500 MG PO TABS
500.0000 mg | ORAL_TABLET | Freq: Two times a day (BID) | ORAL | Status: DC
  Administered 2011-01-20 – 2011-01-25 (×10): 500 mg via ORAL
  Filled 2011-01-20 (×12): qty 1

## 2011-01-20 MED ORDER — GABAPENTIN 300 MG PO CAPS
300.0000 mg | ORAL_CAPSULE | Freq: Three times a day (TID) | ORAL | Status: DC
  Administered 2011-01-20 – 2011-01-25 (×15): 300 mg via ORAL
  Filled 2011-01-20 (×20): qty 1

## 2011-01-20 MED ORDER — POTASSIUM CHLORIDE CRYS ER 20 MEQ PO TBCR
20.0000 meq | EXTENDED_RELEASE_TABLET | Freq: Two times a day (BID) | ORAL | Status: DC
  Administered 2011-01-20 – 2011-01-21 (×2): 20 meq via ORAL
  Filled 2011-01-20 (×3): qty 1

## 2011-01-20 NOTE — Progress Notes (Signed)
Report called to [place], RN on 2000.  Pt transferred via wheelchair.  VSS.  Belongings carried by pt's husband and left in his care.  Care transferred to RN in room 2002.

## 2011-01-20 NOTE — Progress Notes (Signed)
9 Days Post-Op Procedure(s) (LRB): CORONARY ARTERY BYPASS GRAFTING (CABG) (N/A) Subjective: Feeling better.  Ambulated well   Objective: Vital signs in last 24 hours: Temp:  [97.5 F (36.4 C)-98.6 F (37 C)] 97.9 F (36.6 C) (11/24 0759) Pulse Rate:  [57-65] 63  (11/24 0700) Cardiac Rhythm:  [-] Normal sinus rhythm (11/24 0800) Resp:  [16-26] 18  (11/24 0700) BP: (102-142)/(39-84) 125/79 mmHg (11/24 0700) SpO2:  [92 %-96 %] 96 % (11/24 0700) Weight:  [88.4 kg (194 lb 14.2 oz)] 194 lb 14.2 oz (88.4 kg) (11/24 0439)  Hemodynamic parameters for last 24 hours:    Intake/Output from previous day: 11/23 0701 - 11/24 0700 In: 1008 [P.O.:830; I.V.:20; IV Piggyback:158] Out: 1350 [Urine:1350] Intake/Output this shift: Total I/O In: 84 [P.O.:60; I.V.:20; IV Piggyback:4] Out: 825 [Urine:825]  General appearance: alert and cooperative Heart: regular rate and rhythm, S1, S2 normal, no murmur, click, rub or gallop Lungs: clear to auscultation bilaterally Extremities: edema mild Wound: incision healing well  Lab Results:  Basename 01/20/11 0425 01/19/11 1825 01/18/11 0415  WBC 5.1 -- 4.4  HGB 8.5* 8.5* --  HCT 25.2* 25.0* --  PLT 101* -- 89*   BMET:  Basename 01/20/11 0425 01/19/11 1825 01/18/11 0415  NA 133* 133* --  K 4.1 4.3 --  CL 93* -- 96  CO2 30 -- 30  GLUCOSE 87 161* --  BUN 34* -- 38*  CREATININE 0.96 -- 0.88  CALCIUM 9.1 -- 8.7    PT/INR: No results found for this basename: LABPROT,INR in the last 72 hours ABG    Component Value Date/Time   PHART 7.380 01/15/2011 1942   HCO3 26.1* 01/15/2011 1942   TCO2 27 01/15/2011 1942   ACIDBASEDEF 1.0 01/14/2011 1514   O2SAT 96.0 01/15/2011 1942   CBG (last 3)   Basename 01/20/11 0757 01/19/11 2108 01/19/11 1724  GLUCAP 105* 123* 86    Assessment/Plan: S/P Procedure(s) (LRB): CORONARY ARTERY BYPASS GRAFTING (CABG) (N/A) Mobilize Diuresis Diabetes control Plan for transfer to step-down: see transfer  orders   LOS: 15 days    BARTLE,BRYAN K 01/20/2011

## 2011-01-21 LAB — GLUCOSE, CAPILLARY
Glucose-Capillary: 155 mg/dL — ABNORMAL HIGH (ref 70–99)
Glucose-Capillary: 137 mg/dL — ABNORMAL HIGH (ref 70–99)

## 2011-01-21 MED ORDER — FUROSEMIDE 40 MG PO TABS
40.0000 mg | ORAL_TABLET | Freq: Two times a day (BID) | ORAL | Status: AC
  Administered 2011-01-21 – 2011-01-23 (×4): 40 mg via ORAL
  Filled 2011-01-21 (×4): qty 1

## 2011-01-21 MED ORDER — TRAMADOL HCL 50 MG PO TABS
50.0000 mg | ORAL_TABLET | ORAL | Status: DC | PRN
  Administered 2011-01-25: 100 mg via ORAL
  Filled 2011-01-21: qty 2

## 2011-01-21 MED ORDER — BISACODYL 5 MG PO TBEC: 10.0000 mg | DELAYED_RELEASE_TABLET | Freq: Every day | ORAL | Status: DC | PRN

## 2011-01-21 MED ORDER — DOCUSATE SODIUM 100 MG PO CAPS
200.0000 mg | ORAL_CAPSULE | Freq: Every day | ORAL | Status: DC
  Administered 2011-01-21 – 2011-01-24 (×4): 200 mg via ORAL
  Filled 2011-01-21 (×4): qty 2

## 2011-01-21 MED ORDER — POTASSIUM CHLORIDE CRYS ER 20 MEQ PO TBCR
40.0000 meq | EXTENDED_RELEASE_TABLET | Freq: Two times a day (BID) | ORAL | Status: DC
  Administered 2011-01-21: 40 meq via ORAL
  Filled 2011-01-21 (×3): qty 2
  Filled 2011-01-21: qty 1

## 2011-01-21 MED ORDER — POVIDONE-IODINE 10 % EX SOLN
1.0000 "application " | Freq: Two times a day (BID) | CUTANEOUS | Status: DC
  Administered 2011-01-21 – 2011-01-25 (×8): 1 via TOPICAL
  Filled 2011-01-21: qty 15

## 2011-01-21 MED ORDER — SODIUM CHLORIDE 0.9 % IJ SOLN
3.0000 mL | INTRAMUSCULAR | Status: DC | PRN
  Administered 2011-01-22: 10 mL via INTRAVENOUS

## 2011-01-21 MED ORDER — ONDANSETRON HCL 4 MG PO TABS: 4.0000 mg | ORAL_TABLET | Freq: Four times a day (QID) | ORAL | Status: DC | PRN

## 2011-01-21 MED ORDER — BISACODYL 10 MG RE SUPP: 10.0000 mg | Freq: Every day | RECTAL | Status: DC | PRN

## 2011-01-21 MED ORDER — CARVEDILOL 3.125 MG PO TABS
3.1250 mg | ORAL_TABLET | Freq: Two times a day (BID) | ORAL | Status: DC
  Filled 2011-01-21 (×2): qty 1

## 2011-01-21 MED ORDER — ONDANSETRON HCL 4 MG/2ML IJ SOLN: 4.0000 mg | Freq: Four times a day (QID) | INTRAMUSCULAR | Status: DC | PRN

## 2011-01-21 MED ORDER — MOVING RIGHT ALONG BOOK
Freq: Once | Status: AC
  Administered 2011-01-22: 14:00:00
  Filled 2011-01-21: qty 1

## 2011-01-21 MED ORDER — PANTOPRAZOLE SODIUM 40 MG PO TBEC
40.0000 mg | DELAYED_RELEASE_TABLET | Freq: Every day | ORAL | Status: DC
  Administered 2011-01-21 – 2011-01-25 (×5): 40 mg via ORAL
  Filled 2011-01-21 (×5): qty 1

## 2011-01-21 MED ORDER — ASPIRIN EC 325 MG PO TBEC
325.0000 mg | DELAYED_RELEASE_TABLET | Freq: Every day | ORAL | Status: DC
  Administered 2011-01-21 – 2011-01-25 (×5): 325 mg via ORAL
  Filled 2011-01-21 (×5): qty 1

## 2011-01-21 NOTE — Progress Notes (Addendum)
10 Days Post-Op  Procedure(s) (LRB): CORONARY ARTERY BYPASS GRAFTING (CABG) (N/A) Subjective: Slept well, feeling stronger. No new c/o  Objective  Telemetry Sinus Brady  Temp:  [97.5 F (36.4 C)-98.3 F (36.8 C)] 97.5 F (36.4 C) (11/25 0616) Pulse Rate:  [51-61] 61  (11/25 0616) Resp:  [18-21] 20  (11/25 0616) BP: (100-154)/(46-79) 107/51 mmHg (11/25 0616) SpO2:  [93 %-96 %] 93 % (11/25 0616) Weight:  [196 lb 12.8 oz (89.268 kg)] 196 lb 12.8 oz (89.268 kg) (11/25 0457)preop 181 lbs   Intake/Output Summary (Last 24 hours) at 01/21/11 0831 Last data filed at 01/21/11 0809  Gross per 24 hour  Intake   1040 ml  Output    925 ml  Net    115 ml       General appearance: alert, cooperative and no distress Heart: mildly diminished in the bases Lungs: mildly diminished in the bases Abdomen: soft, non-tender; bowel sounds normal; no masses,  no organomegaly Extremities: 2+ edema bilat LE's Wound: incisions healing well without signs of infection Heart RRR brady-58 Lab Results:  [facility] 01/20/11 0425 01/19/11 1825  NA 133* 133*  K 4.1 4.3  CL 93* --  CO2 30 --  GLUCOSE 87 161*  BUN 34* --  CREATININE 0.96 --  CALCIUM 9.1 --  MG -- --  PHOS -- --   No results found for this basename: AST:2,ALT:2,ALKPHOS:2,BILITOT:2,PROT:2,ALBUMIN:2 in the last 72 hours No results found for this basename: LIPASE:2,AMYLASE:2 in the last 72 hours  Basename 01/20/11 0425 01/19/11 1825  WBC 5.1 --  NEUTROABS -- --  HGB 8.5* 8.5*  HCT 25.2* 25.0*  MCV 91.3 --  PLT 101* --   No results found for this basename: CKTOTAL:4,CKMB:4,TROPONINI:4 in the last 72 hours  Basename 01/19/11 1035  POCBNP 4151.0*   No results found for this basename: DDIMER in the last 72 hours No results found for this basename: HGBA1C in the last 72 hours No results found for this basename: CHOL,HDL,LDLCALC,TRIG,CHOLHDL in the last 72 hours No results found for this basename:  TSH,T4TOTAL,FREET3,T3FREE,THYROIDAB in the last 72 hours No results found for this basename: VITAMINB12,FOLATE,FERRITIN,TIBC,IRON,RETICCTPCT in the last 72 hours  Medications: Scheduled    . amiodarone  400 mg Oral BID  . aspirin EC  325 mg Oral Daily   Or  . aspirin  324 mg Per Tube Daily  . carvedilol  6.25 mg Oral BID WC  . docusate sodium  200 mg Oral Daily  . enoxaparin  30 mg Subcutaneous Q24H  . furosemide  40 mg Oral Daily  . gabapentin  300 mg Oral TID  . insulin aspart  0-20 Units Subcutaneous TID WC  . insulin aspart  4 Units Subcutaneous TID WC  . insulin glargine  60 Units Subcutaneous QHS  . lisinopril  10 mg Oral Daily  . metFORMIN  500 mg Oral BID WC  . moving right along book   Does not apply Once  . pantoprazole  40 mg Oral QAC breakfast  . PARoxetine  20 mg Oral QAM  . potassium chloride  20 mEq Oral BID  . povidone-iodine  1 application Topical BID  . simvastatin  20 mg Oral QHS  . DISCONTD: bisacodyl  10 mg Oral Daily  . DISCONTD: bisacodyl  10 mg Rectal Daily  . DISCONTD: cefTAZidime (FORTAZ) IV  1 g Intravenous Q8H  . DISCONTD: docusate sodium  200 mg Oral Daily  . DISCONTD: furosemide  40 mg Intravenous BID  . DISCONTD: pantoprazole  40 mg Oral Q1200  . DISCONTD: potassium chloride  20 mEq Oral Daily     Radiology/Studies:  No results found.  INR: Will add last result for INR, ABG once components are confirmed Will add last 4 CBG results once components are confirmed  Assessment/Plan: S/P Procedure(s) (LRB): CORONARY ARTERY BYPASS GRAFTING (CABG) (N/A)  1. Continue diuresis 2.aggressive pulm toilet/ cardiac rehab 3. CBG with good control 71-105 range since transfer 4. Recheck labs in am 5. Check 12 lead for QTc , decrease coreg  LOS: 16 days    GOLD,WAYNE E 11/25/20128:31 AM    Chart reviewed, patient examined, agree with above. She is still 8 lbs over preop weight and has significant ankle and pedal edema.  Will increase lasix to  bid.  She feels much better.

## 2011-01-22 ENCOUNTER — Inpatient Hospital Stay (HOSPITAL_COMMUNITY): Payer: Managed Care, Other (non HMO)

## 2011-01-22 ENCOUNTER — Other Ambulatory Visit: Payer: Self-pay

## 2011-01-22 DIAGNOSIS — IMO0001 Reserved for inherently not codable concepts without codable children: Secondary | ICD-10-CM

## 2011-01-22 DIAGNOSIS — E1165 Type 2 diabetes mellitus with hyperglycemia: Secondary | ICD-10-CM

## 2011-01-22 LAB — GLUCOSE, CAPILLARY
Glucose-Capillary: 138 mg/dL — ABNORMAL HIGH (ref 70–99)
Glucose-Capillary: 121 mg/dL — ABNORMAL HIGH (ref 70–99)
Glucose-Capillary: 132 mg/dL — ABNORMAL HIGH (ref 70–99)

## 2011-01-22 LAB — CBC
HCT: 24 % — ABNORMAL LOW (ref 36.0–46.0)
Hemoglobin: 8 g/dL — ABNORMAL LOW (ref 12.0–15.0)
MCH: 31 pg (ref 26.0–34.0)
MCHC: 33.3 g/dL (ref 30.0–36.0)
MCV: 93 fL (ref 78.0–100.0)
Platelets: 135 10*3/uL — ABNORMAL LOW (ref 150–400)
RBC: 2.58 MIL/uL — ABNORMAL LOW (ref 3.87–5.11)
RDW: 13.9 % (ref 11.5–15.5)
WBC: 5.2 10*3/uL (ref 4.0–10.5)

## 2011-01-22 LAB — BASIC METABOLIC PANEL
BUN: 46 mg/dL — ABNORMAL HIGH (ref 6–23)
CO2: 30 mEq/L (ref 19–32)
Calcium: 8.4 mg/dL (ref 8.4–10.5)
Chloride: 96 mEq/L (ref 96–112)
Creatinine, Ser: 1.26 mg/dL — ABNORMAL HIGH (ref 0.50–1.10)
GFR calc Af Amer: 52 mL/min — ABNORMAL LOW (ref >90)
GFR calc non Af Amer: 45 mL/min — ABNORMAL LOW (ref >90)
Glucose, Bld: 157 mg/dL — ABNORMAL HIGH (ref 70–99)
Potassium: 4.9 mEq/L (ref 3.5–5.1)
Sodium: 133 mEq/L — ABNORMAL LOW (ref 135–145)

## 2011-01-22 MED ORDER — FOLIC ACID 1 MG PO TABS
1.0000 mg | ORAL_TABLET | Freq: Every day | ORAL | Status: DC
  Administered 2011-01-22 – 2011-01-25 (×4): 1 mg via ORAL
  Filled 2011-01-22 (×4): qty 1

## 2011-01-22 MED ORDER — POLYSACCHARIDE IRON 150 MG PO CAPS
150.0000 mg | ORAL_CAPSULE | Freq: Every day | ORAL | Status: DC
  Administered 2011-01-22 – 2011-01-25 (×4): 150 mg via ORAL
  Filled 2011-01-22 (×4): qty 1

## 2011-01-22 MED ORDER — POTASSIUM CHLORIDE CRYS ER 20 MEQ PO TBCR: 40.0000 meq | EXTENDED_RELEASE_TABLET | Freq: Every day | ORAL | Status: DC

## 2011-01-22 MED ORDER — POTASSIUM CHLORIDE CRYS ER 20 MEQ PO TBCR
20.0000 meq | EXTENDED_RELEASE_TABLET | Freq: Every day | ORAL | Status: DC
  Administered 2011-01-22 – 2011-01-25 (×4): 20 meq via ORAL
  Filled 2011-01-22 (×3): qty 1

## 2011-01-22 MED ORDER — SODIUM CHLORIDE 0.9 % IJ SOLN
10.0000 mL | INTRAMUSCULAR | Status: DC | PRN
  Administered 2011-01-22 – 2011-01-24 (×7): 10 mL

## 2011-01-22 MED ORDER — SODIUM CHLORIDE 0.9 % IJ SOLN: 10.0000 mL | Freq: Two times a day (BID) | INTRAMUSCULAR | Status: DC

## 2011-01-22 NOTE — Progress Notes (Signed)
UR Completed.  [name] Jane [phone]. 01/22/2011  

## 2011-01-22 NOTE — Progress Notes (Addendum)
11 Days Post-Op Procedure(s) (LRB): CORONARY ARTERY BYPASS GRAFTING (CABG) (N/A)  Subjective: Patient feeling better this am.  Objective: Vital signs in last 24 hours: Patient Vitals for the past 24 hrs:  BP Temp Temp src Pulse Resp SpO2 Weight  01/22/11 0521 124/63 mmHg 98 F (36.7 C) Oral 79  19  94 % 193 lb (87.544 kg)  01/21/11 1951 121/67 mmHg 98.3 F (36.8 C) Oral 80  20  98 % -  01/21/11 1359 100/55 mmHg 97.9 F (36.6 C) Oral 48  22  94 % -   Pre op weight  82.2 kg Current Weight  01/22/11 193 lb (87.544 kg)       Intake/Output from previous day: 11/25 0701 - 11/26 0700 In: 480 [P.O.:480] Out: 2850 [Urine:2850]   Physical Exam:  Cardiovascular: RRR, no murmurs, gallops, or rubs. Pulmonary: Decreased at bases  bilaterally; no rales, wheezes, or rhonchi. Abdomen: Soft, non tender, bowel sounds present. Extremities: 2+ bilateral lower extremity edema. Wounds: Clean and dry.  No erythema or signs of infection.  Lab Results: CBC: Basename 01/22/11 0500 01/20/11 0425  WBC 5.2 5.1  HGB 8.0* 8.5*  HCT 24.0* 25.2*  PLT 135* 101*   BMET:  Basename 01/22/11 0500 01/20/11 0425  NA 133* 133*  K 4.9 4.1  CL 96 93*  CO2 30 30  GLUCOSE 157* 87  BUN 46* 34*  CREATININE 1.26* 0.96  CALCIUM 8.4 9.1    PT/INR: No results found for this basename: LABPROT,INR in the last 72 hours ABG:  INR: Will add last result for INR, ABG once components are confirmed Will add last 4 CBG results once components are confirmed  Assessment/Plan:  1. CV - Previous Afib (s/p D/C cardioversion) nowApaced at 80.  EKG this am shows bifasicular block (seen previously).On Amiodarone 400 bid only. Coreg discontinued 11/25. 2.  Pulmonary - Encourage incentive spirometer,flutter valve. Wean O2 as tolerates. 3. Volume Overload - Diuresed well with Lasix 40 bid. Will continue for today. Check BMET am as Cr has increased to 1.26. 4.  Acute blood loss anemia - H/H stable at 8/24. Start Nu  Iron. 5.DM-CBGs 155/137/138.Continue Metformin 500 bid and insulin. 6.Thrombocytopenia-Plts remain 133,000.   Ardelle Balls, PA 01/22/2011   patient examined and medical record reviewed,agree with above note.Recheck 2-D Echo before discharge. [name],Yanai Hobson 01/22/2011

## 2011-01-22 NOTE — Progress Notes (Signed)
CARDIAC REHAB PHASE I   PRE:  Rate/Rhythm: 72 paced    BP: sitting 110/70    SaO2: 98 2L  MODE:  Ambulation: 140 ft   POST:  Rate/Rhythm: 71 paced    BP: sitting 110/60     SaO2: 98 2L  Tolerated fair with RW, 2L O2 and assist x2.  Fairly steady, just weak, esp. With distance.  No events.  To chair. PT to see in pm [phone]  Harriet Masson CES, ACSM

## 2011-01-22 NOTE — Plan of Care (Signed)
Problem: Phase III Progression Outcomes Goal: Transfer to PCTU/Telemetry POD Outcome: Completed/Met Date Met:  01/22/11 01/22/11

## 2011-01-22 NOTE — Progress Notes (Signed)
Continuing to follow for possible acute inpatient rehab admit.  Noted patient not medically ready for rehab yet.  I will follow along with you for progress.  Call me for questions.  Pager [phone]

## 2011-01-22 NOTE — Progress Notes (Signed)
Pt walked 338ft with a walker. Pt tolerated well. Pt used 2liters of O2. Pt didn't have any complaints of shortness of breath. Pt back in bed with call bell within reach.

## 2011-01-22 NOTE — Progress Notes (Signed)
Pt. Did not feel up to ambulating last night. Encouraged to walk more in am.

## 2011-01-23 ENCOUNTER — Inpatient Hospital Stay (HOSPITAL_COMMUNITY): Payer: Managed Care, Other (non HMO)

## 2011-01-23 ENCOUNTER — Encounter (HOSPITAL_COMMUNITY): Payer: Self-pay | Admitting: Cardiothoracic Surgery

## 2011-01-23 DIAGNOSIS — E1165 Type 2 diabetes mellitus with hyperglycemia: Secondary | ICD-10-CM

## 2011-01-23 DIAGNOSIS — IMO0001 Reserved for inherently not codable concepts without codable children: Secondary | ICD-10-CM

## 2011-01-23 LAB — BASIC METABOLIC PANEL
BUN: 44 mg/dL — ABNORMAL HIGH (ref 6–23)
CO2: 31 mEq/L (ref 19–32)
Chloride: 97 mEq/L (ref 96–112)
GFR calc non Af Amer: 46 mL/min — ABNORMAL LOW (ref >90)
Glucose, Bld: 111 mg/dL — ABNORMAL HIGH (ref 70–99)
Potassium: 4.8 mEq/L (ref 3.5–5.1)
Sodium: 137 mEq/L (ref 135–145)

## 2011-01-23 LAB — GLUCOSE, CAPILLARY
Glucose-Capillary: 138 mg/dL — ABNORMAL HIGH (ref 70–99)
Glucose-Capillary: 203 mg/dL — ABNORMAL HIGH (ref 70–99)
Glucose-Capillary: 123 mg/dL — ABNORMAL HIGH (ref 70–99)
Glucose-Capillary: 153 mg/dL — ABNORMAL HIGH (ref 70–99)
Glucose-Capillary: 182 mg/dL — ABNORMAL HIGH (ref 70–99)
Glucose-Capillary: 136 mg/dL — ABNORMAL HIGH (ref 70–99)

## 2011-01-23 MED ORDER — AMIODARONE HCL 200 MG PO TABS
200.0000 mg | ORAL_TABLET | Freq: Two times a day (BID) | ORAL | Status: DC
  Administered 2011-01-24 – 2011-01-25 (×3): 200 mg via ORAL
  Filled 2011-01-23 (×4): qty 1

## 2011-01-23 MED ORDER — INSULIN GLARGINE 100 UNIT/ML ~~LOC~~ SOLN: 62.0000 [IU] | Freq: Every day | SUBCUTANEOUS | Status: DC

## 2011-01-23 MED ORDER — INSULIN GLARGINE 100 UNIT/ML ~~LOC~~ SOLN
30.0000 [IU] | Freq: Two times a day (BID) | SUBCUTANEOUS | Status: DC
  Administered 2011-01-23 – 2011-01-25 (×5): 30 [IU] via SUBCUTANEOUS
  Filled 2011-01-23 (×2): qty 3

## 2011-01-23 NOTE — Progress Notes (Signed)
Patient just walked 350 feet with cardiac rehab.  Also, please note OT now recommending home with [facility] therapies.  I agree patient no longer needs acute inpatient rehab.  Should be fine with home health therapies.  I will sign off for inpatient rehab at this point.

## 2011-01-23 NOTE — Progress Notes (Signed)
  Echocardiogram 2D Echocardiogram has been performed.  Juanita Laster Trenace Coughlin, RDCS 01/23/2011, 12:10 PM

## 2011-01-23 NOTE — Plan of Care (Signed)
Problem: Discharge Progression Outcomes Goal: Activity appropriate for discharge plan Outcome: Progressing OT evaluation completed. Recommend HHOT and tub transfer bench at D/C and will follow acutely. Thanks-  Cassandria Anger, OTR/L Pager: [phone] 01/23/2011 .

## 2011-01-23 NOTE — Progress Notes (Signed)
Physical Therapy Treatment Patient Details Name: [name] MRN: [id number] DOB: 18-May-1950 Today's Date: 01/23/2011  PT Assessment/Plan  PT - Assessment/Plan Comments on Treatment Session: Patient reports thigh soreness from increased activity.  She is better able to mobilize with limited use of arms.  Re-assessment of goals performed today.   PT Plan: Discharge plan remains appropriate;Frequency remains appropriate PT Frequency: Min 3X/week Follow Up Recommendations: Home health PT;24 hour supervision/assistance Equipment Recommended: Rolling walker with 5" wheels;3 in 1 bedside comode;Tub/shower bench (per PT/OT notes.  ) PT Goals  Acute Rehab PT Goals PT Goal Formulation: With patient Time For Goal Achievement: 2 weeks PT Goal: Rolling Supine to Left Side - Progress: Other (comment) (not tested today, goal still appropriate.  ) PT Goal: Supine/Side to Sit - Progress: Other (comment) (not tested today, goal still appropriate) Pt will Transfer Sit to Stand/Stand to Sit: with modified independence;without upper extremity assist PT Transfer Goal: Sit to Stand/Stand to Sit - Progress: Revised (modified due to lack of progress/goal met) Pt will Ambulate: >150 feet;with modified independence;with least restrictive assistive device PT Goal: Ambulate - Progress: Revised (modified due to lack of progress/goal met) PT Goal: Up/Down Stairs - Progress: Other (comment) (not tested today, goal still appropriate.  ) Additional Goals PT Goal: Additional Goal #1 - Progress: Progressing toward goal  PT Treatment Precautions/Restrictions  Precautions Precautions: Sternal;Fall Required Braces or Orthoses: No Restrictions Weight Bearing Restrictions: No Mobility (including Balance) Bed Mobility Sit to Supine - Left: 6: Modified independent (Device/Increase time);With rail;HOB flat Sit to Supine - Left Details (indicate cue type and reason): patient was able to get both legs back into bed on  her own today.  This is an improvement per patient.   Transfers Sit to Stand: 5: Supervision Sit to Stand Details (indicate cue type and reason): supervision for safety.  Patient able to get up with very minimal upper extremity support.   Stand to Sit: 5: Supervision Stand to Sit Details: supervision for safety.  Verbal cues to try to use legs and not arms.   Ambulation/Gait Ambulation/Gait Assistance: 5: Supervision Ambulation/Gait Assistance Details (indicate cue type and reason): supervision for safety secondary to patient stopping every 50' to rest legs even with RW.  I beleve that legs are sore seondary to prolonged hospiralization and increased reliance on legs because of sternal precautions.   Ambulation Distance (Feet): 150 Feet Assistive device: Rolling walker Gait Pattern: Step-through pattern    Exercise  Other Exercises Other Exercises: Patient performed incintive spirometer x6 reps.  She was only able to get to 750 max.  Reviewed correct proceedure with patient (she was taking short, quick breaths and not long prolonged breaths).  Encouraged continued practice.   End of Session PT - End of Session Equipment Utilized During Treatment: Other (comment) (RW, external pacer) Activity Tolerance: Patient tolerated treatment well;Other (comment) (limited by leg/thigh soreness bil. ) Patient left: in bed;with call bell in reach General Behavior During Session: [facility] for tasks performed Cognition: [facility] for tasks performed  Marylynne Keelin B. Onelia Cadmus, PT, DPT [phone]  01/23/2011, 3:43 PM

## 2011-01-23 NOTE — Progress Notes (Addendum)
12 Days Post-Op Procedure(s) (LRB): CORONARY ARTERY BYPASS GRAFTING (CABG) (N/A)  Subjective: Patient finishing breakfast.  Denies sob or other complaints at this time.  Objective: Vital signs in last 24 hours: Patient Vitals for the past 24 hrs:  BP Temp Temp src Pulse Resp SpO2 Weight  01/23/11 0509 134/72 mmHg 97.8 F (36.6 C) Oral 72  20  99 % 190 lb 0.6 oz (86.2 kg)  01/22/11 2033 114/63 mmHg 97.9 F (36.6 C) Oral 74  20  98 % -  01/22/11 1318 127/70 mmHg 98.2 F (36.8 C) Oral 72  20  98 % -  01/22/11 1021 114/69 mmHg - - - - - -   Pre op weight  82.2 kg Current Weight  01/23/11 190 lb 0.6 oz (86.2 kg)       Intake/Output from previous day: 11/26 0701 - 11/27 0700 In: -  Out: 2500 [Urine:2500]   Physical Exam:  Cardiovascular: RRR, no murmurs, gallops, or rubs. Pulmonary: Decreased at bases  Bilaterally R>L; no rales, wheezes, or rhonchi. Abdomen: Soft, non tender, bowel sounds present. Extremities: 2+ bilateral lower extremity edema. Wounds: Clean and dry.  No erythema or signs of infection.  Lab Results: CBC:  Basename 01/22/11 0500  WBC 5.2  HGB 8.0*  HCT 24.0*  PLT 135*   BMET:   Basename 01/23/11 0500 01/22/11 0500  NA 137 133*  K 4.8 4.9  CL 97 96  CO2 31 30  GLUCOSE 111* 157*  BUN 44* 46*  CREATININE 1.24* 1.26*  CALCIUM 8.7 8.4    PT/INR: No results found for this basename: LABPROT,INR in the last 72 hours ABG:  INR: Will add last result for INR, ABG once components are confirmed Will add last 4 CBG results once components are confirmed  Assessment/Plan:  1. CV - Previous Afib (s/p D/C cardioversion) now Apaced at 72.  On Amiodarone 400 bid, Lisinopril 10. Coreg discontinued 11/25.  Hopefully, conduction system will "recover" so that PPM is not needed. Will need an echo prior to discharge. 2.  Pulmonary - Encourage incentive spirometer,flutter valve. Wean O2 as tolerates. CXR this am shows persistent bilateral pleural effusions R much  greater than trace left and atx at right base. Continue with Lasix bid, and may need to consider a right thoracentesis. 3. Volume Overload - Diuresing well with Lasix 40 bid. Will continue for today.  4.  Acute blood loss anemia - H/H stable at 8/24.Continue Nu Iron. 5.DM-CBGs 178/121/132/.Continue Metformin 500 bid.  Will change Lantus insulin to bid (as taken pre op) and stop tid meal coverage. 6.Thrombocytopenia-Plts remain 133,000. 7.Cr slightly decreased from 1.26 to 1.24.    patient examined and medical record reviewed,agree with above note.R effusion too small for thoracentesis. Cont lasix,daily labs,daily CXR to follow effusions [name],[name] 01/23/2011    ZIMMERMAN,DONIELLE M, PA 01/23/2011

## 2011-01-23 NOTE — Progress Notes (Signed)
CSW note recommendation is for home with home health at this time. CSW advised NCM of pt disposition. No other SW needs identified, CSW signing off.

## 2011-01-23 NOTE — Progress Notes (Signed)
Occupational Therapy Evaluation Patient Details Name: [name] MRN: [id number] DOB: [birth year] Today's Date: 01/23/2011  Problem List:  Patient Active Problem List  Diagnoses  . Type II or unspecified type diabetes mellitus without mention of complication, uncontrolled  . HYPERLIPIDEMIA-MIXED  . DEPRESSION  . Essential hypertension, benign  . DIVERTICULOSIS  . FATTY LIVER DISEASE  . ARTHRITIS  . Unstable angina  . Coronary atherosclerosis of native coronary artery  . Abnormal TSH  . Thrombocytopenia  . Carotid stenosis  . Atrial fibrillation    Past Medical History:  Past Medical History  Diagnosis Date  . Coronary atherosclerosis of native coronary artery     BMS circ 2001, DES LAD 2005, DES LAD and RCA 2008  . Drug allergy     Allergy to Plavix (hives) - took Ticlid in past  . Essential hypertension, benign   . Mixed hyperlipidemia   . Type 2 diabetes mellitus   . Breast cancer     Chemo and mastectomy  . Angina   . Myocardial infarction ~ 2002  . Diabetes mellitus   . Shortness of breath 01/05/11     "w/exertion"  . Arthritis     rheumatoid  . Depression   . Peripheral neuropathy     "from diabetes"  . Peptic ulcer disease ~ 1975   Past Surgical History:  Past Surgical History  Procedure Date  . Right mastectomy   . Mastectomy 1988    right  . Cholecystectomy 1969  . Abdominal hysterectomy 1989  . Tonsillectomy and adenoidectomy 1979  . Appendectomy 1969  . Tubal ligation 1972  . Coronary angioplasty with stent placement 4 Times    "had 2 stents each time; last time 2010"  . Cardiac catheterization 01/05/11    "no stents"    OT Assessment/Plan/Recommendation OT Assessment Clinical Impression Statement: Pt. will benefit from OT in the acute setting to increase functional independence with ADLs and decrease burden of care at D/C home. OT Recommendation/Assessment: Patient will need skilled OT in the acute care venue OT Problem List: Decreased  strength;Decreased activity tolerance;Impaired balance (sitting and/or standing);Decreased safety awareness;Decreased knowledge of precautions Barriers to Discharge: None OT Therapy Diagnosis : Generalized weakness OT Plan OT Frequency: Min 2X/week OT Treatment/Interventions: Self-care/ADL training;DME and/or AE instruction;Energy conservation;Therapeutic activities;Patient/family education;Balance training OT Recommendation Follow Up Recommendations: Home health OT Equipment Recommended: Tub/shower bench Individuals Consulted Consulted and Agree with Results and Recommendations: Patient OT Goals Acute Rehab OT Goals OT Goal Formulation: With patient Time For Goal Achievement: 2 weeks ADL Goals Pt Will Perform Grooming: with modified independence;Standing at sink ADL Goal: Grooming - Progress: Progressing toward goals Pt Will Perform Lower Body Bathing: with set-up;Sit to stand from bed ADL Goal: Lower Body Bathing - Progress: Progressing toward goals Pt Will Perform Lower Body Dressing: with set-up;Sit to stand from bed ADL Goal: Lower Body Dressing - Progress: Progressing toward goals Pt Will Transfer to Toilet: with modified independence;Stand pivot transfer ADL Goal: Toilet Transfer - Progress: Progressing toward goals Pt Will Perform Toileting - Hygiene: Independently;Sitting on 3-in-1 or toilet ADL Goal: Toileting - Hygiene - Progress: Progressing toward goals  OT Evaluation Precautions/Restrictions  Precautions Precautions: Fall;Sternal Required Braces or Orthoses: No Restrictions Weight Bearing Restrictions: No Prior Functioning   Prior Function Level of Independence: Independent with homemaking with ambulation ADL ADL Eating/Feeding: Performed;Independent Where Assessed - Eating/Feeding: Edge of bed Grooming: Performed;Wash/dry hands;Wash/dry face;Teeth care;Set up;Supervision/safety Grooming Details (indicate cue type and reason): Pt. completed ADLs at sink ~4  mins  before needing to finish tasks in supported sitting position Where Assessed - Grooming: Standing at sink;Sitting, chair Upper Body Bathing: Performed;Chest;Right arm;Left arm;Abdomen;Set up Where Assessed - Upper Body Bathing: Sitting, chair Lower Body Bathing: Simulated;Minimal assistance Lower Body Bathing Details (indicate cue type and reason): Assist for lower aspect of legs Where Assessed - Lower Body Bathing: Sit to stand from bed Upper Body Dressing: Performed;Set up Upper Body Dressing Details (indicate cue type and reason): With donning gown Where Assessed - Upper Body Dressing: Sitting, bed Lower Body Dressing: Simulated;Minimal assistance Lower Body Dressing Details (indicate cue type and reason): With starting socks due to unable to reach all the way down to socks Where Assessed - Lower Body Dressing: Sit to stand from bed Toilet Transfer: Performed;Minimal assistance Toilet Transfer Details (indicate cue type and reason): Min verbal cues for hands on knees Toilet Transfer Method: Stand pivot Toilet Transfer Equipment: Bedside commode Toileting - Clothing Manipulation: Simulated;Set up Where Assessed - Toileting Clothing Manipulation: Sit to stand from 3-in-1 or toilet Toileting - Hygiene: Performed;Set up Where Assessed - Toileting Hygiene: Sit on 3-in-1 or toilet Tub/Shower Transfer: Not assessed Tub/Shower Transfer Method: Not assessed Equipment Used: Rolling walker ADL Comments: Pt. completed bed-sink with RW use and frequent verbal cues for hand placement during transfers and to follow sternal precautions. Pt. recalled 1 sternal precaution of no lifting. Vision/Perception  Vision - History Baseline Vision: Wears glasses all the time Patient Visual Report: No change from baseline Vision - Assessment Eye Alignment: Within Functional Limits Vision Assessment: Vision not tested Cognition   Sensation/Coordination   Extremity Assessment RUE Assessment RUE  Assessment: Within Functional Limits LUE Assessment LUE Assessment: Within Functional Limits Mobility  Bed Mobility Bed Mobility: No Transfers Transfers: Yes Sit to Stand: 4: Min assist;From elevated surface;From bed;Without upper extremity assist Sit to Stand Details (indicate cue type and reason): Min cues to place hands on knees Exercises   End of Session OT - End of Session Equipment Utilized During Treatment: Gait belt Activity Tolerance: Patient tolerated treatment well Patient left: in chair;with call bell in reach Nurse Communication: Mobility status for transfers General Behavior During Session: [facility] for tasks performed Cognition: [facility] for tasks performed   Larisa Lanius, OTR/L Pager [phone] 01/23/2011, 9:50 AM

## 2011-01-23 NOTE — Progress Notes (Signed)
CARDIAC REHAB PHASE I   PRE:  Rate/Rhythm: 72 paced  BP:  Supine:   Sitting: 98/50  Standing:    SaO2: 96 % ra  MODE:  Ambulation: 350 ft   POST:  Rate/Rhythem: 72 paced  BP:  Supine:   Sitting: 100/50  Standing:    SaO2: 95 % ra  Pt ambulated with walker and 2 assist tolerated well. States feels stronger today. Back to chair with feet up and call bell in reach. Did well on RA.   Rosalie Doctor

## 2011-01-23 NOTE — Plan of Care (Signed)
Problem: Phase III Progression Outcomes Goal: Discharge plan remains appropriate-arrangements made Outcome: Progressing PT/OT recommending HHPT/HHOT, RW, 3-in-1, and tub transfer bench for discharge as well as 24 hour supervision.

## 2011-01-23 NOTE — Progress Notes (Signed)
Pt with external pacer set to AAI 56, with sensitivity of 0.5.  Pt has been NSR in the 60's all day, and continues that rhythm now; however, tele showing random pacer spikes.  Initial increase in sensitivity increased the frequency and randomness of spikes.  RN consulted with PA Gershon Crane who was on the floor who advised switching the leads at the box.  RN did this and again increased sensitivity.  Frequency of spikes decreased but they continued.  Wayne Gold advised turning pacer off, which RN did at this time.  PA Doree Fudge notified of this change, and states she plans to round on PT when out of the OR this afternoon.  PT is asymptomatic, NSR 60-68, resting comfortably in bed with call bell in reach.  Will continue to monitor closely.

## 2011-01-24 ENCOUNTER — Inpatient Hospital Stay (HOSPITAL_COMMUNITY): Payer: Managed Care, Other (non HMO)

## 2011-01-24 LAB — CBC
HCT: 24.2 % — ABNORMAL LOW (ref 36.0–46.0)
Hemoglobin: 7.9 g/dL — ABNORMAL LOW (ref 12.0–15.0)
MCH: 30.7 pg (ref 26.0–34.0)
MCHC: 32.6 g/dL (ref 30.0–36.0)
MCV: 94.2 fL (ref 78.0–100.0)
Platelets: 157 10*3/uL (ref 150–400)
RBC: 2.57 MIL/uL — ABNORMAL LOW (ref 3.87–5.11)
RDW: 14 % (ref 11.5–15.5)
WBC: 6.1 10*3/uL (ref 4.0–10.5)

## 2011-01-24 LAB — BASIC METABOLIC PANEL
BUN: 49 mg/dL — ABNORMAL HIGH (ref 6–23)
CO2: 30 mEq/L (ref 19–32)
Calcium: 8.6 mg/dL (ref 8.4–10.5)
Chloride: 96 mEq/L (ref 96–112)
Creatinine, Ser: 1.24 mg/dL — ABNORMAL HIGH (ref 0.50–1.10)
GFR calc Af Amer: 53 mL/min — ABNORMAL LOW (ref >90)
GFR calc non Af Amer: 46 mL/min — ABNORMAL LOW (ref >90)
Glucose, Bld: 138 mg/dL — ABNORMAL HIGH (ref 70–99)
Potassium: 4.7 mEq/L (ref 3.5–5.1)
Sodium: 134 mEq/L — ABNORMAL LOW (ref 135–145)

## 2011-01-24 LAB — GLUCOSE, CAPILLARY
Glucose-Capillary: 125 mg/dL — ABNORMAL HIGH (ref 70–99)
Glucose-Capillary: 185 mg/dL — ABNORMAL HIGH (ref 70–99)
Glucose-Capillary: 143 mg/dL — ABNORMAL HIGH (ref 70–99)
Glucose-Capillary: 161 mg/dL — ABNORMAL HIGH (ref 70–99)

## 2011-01-24 MED ORDER — POLYSACCHARIDE IRON 150 MG PO CAPS: 150.0000 mg | ORAL_CAPSULE | Freq: Every day | ORAL | Status: DC

## 2011-01-24 MED ORDER — POTASSIUM CHLORIDE CRYS ER 20 MEQ PO TBCR: 10.0000 meq | EXTENDED_RELEASE_TABLET | Freq: Every day | ORAL | Status: DC

## 2011-01-24 MED ORDER — POTASSIUM CHLORIDE CRYS ER 10 MEQ PO TBCR: 10.0000 meq | EXTENDED_RELEASE_TABLET | Freq: Every day | ORAL | Status: DC

## 2011-01-24 MED ORDER — FOLIC ACID 1 MG PO TABS: 1.0000 mg | ORAL_TABLET | Freq: Every day | ORAL | Status: DC

## 2011-01-24 MED ORDER — FUROSEMIDE 40 MG PO TABS: 40.0000 mg | ORAL_TABLET | Freq: Every day | ORAL | Status: DC

## 2011-01-24 MED ORDER — INSULIN GLARGINE 100 UNIT/ML ~~LOC~~ SOLN: 30.0000 [IU] | Freq: Two times a day (BID) | SUBCUTANEOUS | Status: DC

## 2011-01-24 MED ORDER — TRAMADOL HCL 50 MG PO TABS: 50.0000 mg | ORAL_TABLET | ORAL | Status: AC | PRN

## 2011-01-24 MED ORDER — AMIODARONE HCL 200 MG PO TABS: 200.0000 mg | ORAL_TABLET | Freq: Two times a day (BID) | ORAL | Status: DC

## 2011-01-24 MED ORDER — INSULIN LISPRO 100 UNIT/ML ~~LOC~~ SOLN: 2.0000 [IU] | Freq: Three times a day (TID) | SUBCUTANEOUS | Status: DC

## 2011-01-24 NOTE — Progress Notes (Addendum)
13 Days Post-Op Procedure(s) (LRB): CORONARY ARTERY BYPASS GRAFTING (CABG) (N/A)  Subjective: Patient feeling ok this am. No real complaints.  Objective: Vital signs in last 24 hours: Patient Vitals for the past 24 hrs:  BP Temp Temp src Pulse Resp SpO2 Weight  01/24/11 0610 125/59 mmHg 97.4 F (36.3 C) Oral 61  20  91 % -  01/24/11 0517 - - - - - - 190 lb 4.1 oz (86.3 kg)  01/23/11 2050 109/52 mmHg 98.8 F (37.1 C) Oral 62  22  93 % -  01/23/11 1311 113/70 mmHg 98 F (36.7 C) Oral 63  20  98 % -   Pre op weight  82.2 kg Current Weight  01/24/11 190 lb 4.1 oz (86.3 kg)       Intake/Output from previous day: 11/27 0701 - 11/28 0700 In: -  Out: 450 [Urine:450]   Physical Exam:  Cardiovascular: RRR, no murmurs, gallops, or rubs. Pulmonary: Decreased at bases  bilaterally R>L; no rales, wheezes, or rhonchi. Abdomen: Soft, non tender, bowel sounds present. Extremities: 2+ bilateral lower extremity edema (is decreasing) Wounds: Clean and dry.  No erythema or signs of infection.  Lab Results: CBC:  Basename 01/24/11 0504 01/22/11 0500  WBC 6.1 5.2  HGB 7.9* 8.0*  HCT 24.2* 24.0*  PLT 157 135*   BMET:   Basename 01/24/11 0504 01/23/11 0500  NA 134* 137  K 4.7 4.8  CL 96 97  CO2 30 31  GLUCOSE 138* 111*  BUN 49* 44*  CREATININE 1.24* 1.24*  CALCIUM 8.6 8.7    PT/INR: No results found for this basename: LABPROT,INR in the last 72 hours ABG:  INR: Will add last result for INR, ABG once components are confirmed Will add last 4 CBG results once components are confirmed  Assessment/Plan:  1. CV - Previous Afib (s/p D/C cardioversion) and Apaced.  Was on Amiodarone 400 bid. HR has remained around 60.  Amiodarone has been decreased to 200 bid.Continue Lisinopril 10.   Not on a BB secondary to bradycardia.Will order echo . 2.  Pulmonary - Encourage incentive spirometer,flutter valve. On RA this am. 3. Volume Overload - Diuresing well with Lasix 40 bid. Will  continue for today.    4.  Acute blood loss anemia - H/H slightly decreased to 7.9/24.2.Not symptomatic.Continue Nu Iron. 5.DM-CBGs 182/136/125.Continue Metformin 500 bid.  Continue Lantus insulin bid.  6.Thrombocytopenia resolved-Plts now 157,000. 7.Cr stable at  1.24. 8.Mild hyponatremia. 9.Possibly d/c 1-2 days.      Ardelle Balls, PA 01/24/2011   I have seen and examined [name] and agree with the above assessment  and plan.  Delight Ovens MD 01/24/2011 3:11 PM

## 2011-01-24 NOTE — Progress Notes (Signed)
Discussed in the long length of stay meeting [name] Weeks 01/24/2011  

## 2011-01-24 NOTE — Progress Notes (Signed)
Pt ambulated 300 ft using walker on room air saturation at 94% tolerated well.

## 2011-01-24 NOTE — Progress Notes (Signed)
Occupational Therapy Treatment Patient Details Name: [name] MRN: [id number] DOB: January 02, 1951 Today's Date: 01/24/2011  OT Assessment/Plan OT Assessment/Plan OT Plan: Discharge plan remains appropriate OT Frequency: Min 2X/week Follow Up Recommendations: Home health OT Equipment Recommended: Rolling walker with 5" wheels;3 in 1 bedside comode;Tub/shower bench OT Goals Acute Rehab OT Goals OT Goal Formulation: With patient Time For Goal Achievement: 2 weeks ADL Goals Pt Will Perform Grooming: with modified independence;Standing at sink ADL Goal: Grooming - Progress: Progressing toward goals Pt Will Perform Lower Body Bathing: with set-up;Sit to stand from bed ADL Goal: Lower Body Bathing - Progress: Not addressed Pt Will Perform Lower Body Dressing: with set-up;Sit to stand from bed ADL Goal: Lower Body Dressing - Progress: Not addressed Pt Will Transfer to Toilet: with modified independence;Stand pivot transfer ADL Goal: Toilet Transfer - Progress: Not addressed Pt Will Perform Toileting - Hygiene: Independently;Sitting on 3-in-1 or toilet ADL Goal: Toileting - Hygiene - Progress: Not addressed  OT Treatment Precautions/Restrictions  Precautions Precautions: Sternal;Fall Restrictions Weight Bearing Restrictions: No   ADL ADL Tub/Shower Transfer: Other (comment) (Demonstrated technique for pt. ) Tub/Shower Transfer Method: Other (comment) (Demonstrated tub transfer technique with equipment ) Tub/Shower Transfer Equipment: Transfer tub bench ADL Comments: Pt. declined activity at this time due to just finishing her bathing and recently walking with cardiac rehab. Pt. educated on energy conservation techniques with ADLs to increase pt's activity tolerance and independence with ADLs. Pt. provided with demonstration of transfer technique with the tub transfer bench and use of RW during the transfer into sitting on the bench.  Mobility  Bed Mobility Bed Mobility: No  End  of Session OT - End of Session Activity Tolerance: Patient limited by fatigue Patient left: in chair;with call bell in reach Nurse Communication: Other (comment) (Per pt's request asked for nutrition consult.) General Behavior During Session: [facility] for tasks performed Cognition: [facility] for tasks performed  Anida Deol, OTR/L Pager [phone]  01/24/2011, 2:44 PM

## 2011-01-24 NOTE — Progress Notes (Signed)
Physician Discharge Summary  Patient ID: [name] MRN: [id number] DOB/AGE: Apr 02, 1950 60 y.o.  Admit date: 01/05/2011 Discharge date: 01/25/2011  Admission Diagnoses:  1.History of CAD (s/p PCI with BMS to circ 01', DES to LAD 05', and DES to LAD and RCA 08') 2.History of hypertension 3.History of diabetes mellitus 4.History of dyslipidemia 5.Histoy of tobacco abuse 6.History of right breast cancer (s/p right mastectomy)  Discharge Diagnoses:   1.History of CAD(s/p PCI with BMS to circ 01', DES to LAD 05', and DES to LAD and RCA 08') 2.History of hypertension 3.History of diabetes mellitus 4.History of dyslipidemia 5.Histoy of tobacco abuse 6.History of right breast cancer (s/p right mastectomy) 7.Post op afib 8.ABL anemia 9.Thrombocytopenia (resolved prior to admission) 10. Carotid artery disease (60-79%) bilaterally   Procedure (s):  1.Cardiac catheterization on 01/05/2011 by Dr. Antoine Poche 2.2 D Echo on 11/12.11/19,11/27 3.CABGx4 (SVG to LAD, SVG to ramus intermediate, SVG to OM, SVG to distal RCA) with EVH bilateral thighs on 01/11/2011  History of Presenting Illness:  This is a 60 year old Caucasian female with the aforementioned past medical history who has had a history of CAD for approximately the past 10 years.  According to this, she has had multiple PCI's with the bare metal stent to the circumflex in 2001, a DES stent to the LAD in 2005 in the ES stent to the LAD and RCA in 2008.  She has been on Ticagelor therapy as well. She was a minimal 01/05/2011 for unstable angina appeared underwent cardiac catheterization on 01/05/2011 by Dr. Antoine Poche.  She was found to have multivessel coronary disease (an ostial 95% LAD, 60% OM1, focal mid 80% stenosis of the RCA), and left ventricular ejection fraction 55-65%. A 2-D echocardiogram was obtained on 01/08/2011. Result showed a 65% left ventricular ejection fraction  and normal valvular anatomy. A cardiothoracic  consultation was then obtained with Dr. Donata Clay for the consideration of coronary artery bypass grafting surgery. Pre operative carotid duplex carotid ultrasound showed a 60-79% (at the low end of the scale) right internal carotid artery stenosis and a 60-79% left internal carotid artery stenosis (low to mid end of the scale).  Potential risks, complications, and benefits of the surgery discussed with the patient and she agreed to proceed.  After a washout period from the Ticagelor, she underwent coronary bypass grafting x4 by Dr. Donata Clay on 01/11/2011.  Brief Hospital Course: Patient was extubated without difficulty earlier the evening of surgery. He remained afebrile and hemodynamically stable. Swan-Ganz, A-line, chest tubes, and Foley were all removed early in her postoperative course. As previously stated, the patient had a history of diabetes and was restarted on her metformin (when she tolerated her diet better) and she was also started on low doses of insulin with good control of her glucose.  It should be noted she was on dopamine postoperatively but was eventually able to be weaned off. She was  volume overloaded and diuresed accordingly. Then went into SVT and rapid A. fib as well as hypotension. She was cardioverted for a total of 4 times in (100 J times one, 200 J x3). She was given IV Lopressor and two amiodarone bolus'  And gttp followed by IV diltiazem. She eventually did convert to sinus bradycardia and A pacing  and was initiated. She did require reintubation as well. She was found to have acute blood loss anemia. Her H&H was low as 7.1 and 21.3. She was given 2 units of packed red blood cells. Followup H&H was  8.9 and 25.5 respectively. Urine output was marginal and her creatinine was up to 1.42. and she was started on renal dopamine. 2-D echocardiogram done showed that the study was very limited but the EF was estimated to be 30%, diffuse hypokinesis and a systolic function with mildly  reduced. She was then able to be extubated without difficulty. She was on IV Elita Quick as she was felt a high risk for pneumonia (a swallow evaluation was done).Urine output improved and dopamine drip was stopped. Another  echocardiogram was done on  01/23/2011. Results showed  the  LVEF to be 40-45%, mid to distal anterior apical hypokinesis,LA mildly dilated, trivial tricuspid regurgitation, and a small pericardial effusion. A physical  therapy consult was obtained to assist the patient with ambulation. Her  amiodarone drip was discontinued and she was placed on amiodarone by mouth. The patient continued to improve and made steady steady progress. She was transferred from the ICU to [facility] for further convalescence on 01/20/2011. He was placed on Coreg. This later had to be discontinued secondary to bradycardia. Patient was started on Nu Iron for acute blood loss anemia. She did have thrombocytopenia post operatively; however, this did resolve and her  last platelet count was up to 157,000.he was requiring a couple liters of oxygen via nasal cannula but this was able to be weaned off as well. She no longer required a pacing and her heart rate remained in the low 60s in sinus rhythm. She's already tolerating a diet and has had multiple bowel movements. Provided she remains afebrile, hemodynamically stable, and pending morning round evaluation, she'll be surgically stable for discharge on 01/25/2011. Epicardial pacing wires and chest tube sutures will be removed prior to her discharge.    Filed Vitals:   01/24/11 1408  BP: 93/50  Pulse: 64  Temp: 97.7 F (36.5 C)  Resp: 18     Latest Vital Signs: Blood pressure 93/50, pulse 64, temperature 97.7 F (36.5 C), temperature source Oral, resp. rate 18, height 5\' 5"  (1.651 m), weight 190 lb 4.1 oz (86.3 kg), last menstrual period 01/05/1996, SpO2 92.00%.  Physical Exam:  Cardiovascular: RRR, no murmurs, gallops, or rubs.  Pulmonary: Decreased at bases  bilaterally R>L; no rales, wheezes, or rhonchi.  Abdomen: Soft, non tender, bowel sounds present.  Extremities: 2+ bilateral lower extremity edema (is decreasing)  Wounds: Clean and dry. No erythema or signs of infection.    Discharge Condition:Stable  Recent laboratory studies:  Lab Results  Component Value Date   WBC 6.1 01/24/2011   HGB 7.9* 01/24/2011   HCT 24.2* 01/24/2011   MCV 94.2 01/24/2011   PLT 157 01/24/2011   Lab Results  Component Value Date   NA 134* 01/24/2011   K 4.7 01/24/2011   CL 96 01/24/2011   CO2 30 01/24/2011   CREATININE 1.24* 01/24/2011   GLUCOSE 138* 01/24/2011      Diagnostic Studies: Dg Chest 2 View  01/24/2011  *RADIOLOGY REPORT*  Clinical Data: Post CABG, effusion.  CHEST - 2 VIEW  Comparison: 01/23/2011 and 01/11/2011.  Findings: Trachea is midline.  Heart is mildly enlarged, stable. Sternotomy wires are stable in position from baseline postoperative examination of 01/11/2011. Left PICC tip projects over the SVC/RA junction.  There are small bilateral pleural effusions, right greater than left, stable, with mild bibasilar air space disease. Linear subsegmental atelectasis in the lingula.  IMPRESSION:  Small bilateral pleural effusions and bibasilar air space disease, right greater than left, stable.  Original Report Authenticated By:  Reyes Ivan, M.D.     Discharge Orders    Future Appointments: Provider: Department: Dept Phone: Center:   02/06/2011 9:00 AM Jonelle Sidle, MD [facility] [phone] [facility]   02/12/2011 2:00 PM [facility] [phone] [facility]      Discharge Medications: Current Discharge Medication List    START taking these medications   Details  amiodarone (PACERONE) 200 MG tablet Take 1 tablet (200 mg total) by mouth 2 (two) times daily. Qty: 60 tablet, Refills: 1    folic acid (FOLVITE) 1 MG tablet Take 1 tablet (1 mg total) by mouth daily. For one month then may stop.      furosemide (LASIX) 40 MG tablet Take 1 tablet (40 mg total) by mouth daily. For ten days then stop. Qty: 10 tablet, Refills: 0    polysaccharide iron (NIFEREX) 150 MG CAPS capsule Take 1 capsule (150 mg total) by mouth daily. For one month then may stop. Qty: 30 each, Refills: 0    potassium chloride SA (K-DUR,KLOR-CON) 10 MEQ tablet Take 1 tablet (10 mEq total) by mouth daily. For ten days then stop. Qty: 10 tablet, Refills: 0    traMADol (ULTRAM) 50 MG tablet Take 1-2 tablets (50-100 mg total) by mouth every 4 (four) hours as needed for pain. Maximum dose= 8 tablets per day Qty: 45 tablet, Refills: 0      CONTINUE these medications which have CHANGED   Details  insulin glargine (LANTUS) 100 UNIT/ML injection Inject 30 Units into the skin 2 (two) times daily. Patient to gradually increase her insulin glargine to pre op dose of 100 units [place] two times daily.  Contact medical doctor for any questions. Qty: 10 mL    insulin lispro (HUMALOG) 100 UNIT/ML injection Inject 2 Units into the skin 3 (three) times daily before meals. Patient to gradually increase insulin lispro to the pre op dose of 84 units [place] three times daily.  Contact medical doctor for any questions. Qty: 10 mL      CONTINUE these medications which have NOT CHANGED   Details  aspirin 325 MG tablet Take 325 mg by mouth daily.      gabapentin (NEURONTIN) 300 MG capsule Take 300 mg by mouth 3 (three) times daily.      lisinopril (PRINIVIL,ZESTRIL) 10 MG tablet Take 10 mg by mouth daily.      metFORMIN (GLUCOPHAGE) 500 MG tablet Take 500 mg by mouth 2 (two) times daily with a meal.      metoprolol (LOPRESSOR) 50 MG tablet Take 50 mg by mouth 2 (two) times daily.      PARoxetine (PAXIL) 20 MG tablet Take 20 mg by mouth every morning.      simvastatin (ZOCOR) 20 MG tablet Take 20 mg by mouth at bedtime.        STOP taking these medications     diclofenac (VOLTAREN) 75 MG EC tablet      Nutritional Supplements (VITAL  HN PO)      OVER THE COUNTER MEDICATION      OVER THE COUNTER MEDICATION      Ticagrelor (BRILINTA) 90 MG TABS tablet         Follow Up Appointments: Follow-up Information    Follow up with VAN Dinah Beers, MD on 02/12/2011. (PA/LAT CXR  02/12/2011 at 1:30 pm;Appointment to see Dr. Zenaida Niece Trigt's PA at 2 pm)    Contact information:   [address] [place] [address] [phone]  Follow up with Nona Dell, MD. Make an appointment in 2 weeks. (Call for a follow up appointment for 2 weeks)    Contact information:   [address] [place]. [place] [address] [phone]       Follow up with Medical Doctor. Make an appointment in 1 month. (Follow up management of diabetes )          Signed: Ardelle Balls, PA-C 01/24/2011, 3:54 PM

## 2011-01-24 NOTE — Progress Notes (Signed)
   CARE MANAGEMENT NOTE 01/24/2011  Patient:  BETSAIDA, [place]   Account Number:  [id number]  Date Initiated:  01/11/2011  Documentation initiated by:  GRAVES-BIGELOW,BRENDA  Subjective/Objective Assessment:   Pt is from [place]. Severe 2 vessel CAD awaiting CABG. Awaiting ticagrelor washout. Plan CABG for 01-11-11.     Action/Plan:   Pt will benefit from [facility] RN at d/c.   Anticipated DC Date:  01/25/2011   Anticipated DC Plan:  HOME W HOME HEALTH SERVICES  In-house referral  Clinical Social Worker      DC Planning Services  CM consult      [facility] Choice  HOME HEALTH  DURABLE MEDICAL EQUIPMENT   Choice offered to / List presented to:  C-1 Patient   DME arranged  [facility]      DME agency  [facility]     HH arranged  HH-1 RN  HH-2 PT  HH-3 OT      [facility] agency  [facility]   Status of service:  Completed, signed off Medicare Important Message given?   (If response is "NO", the following Medicare IM given date fields will be blank) Date Medicare IM given:   Date Additional Medicare IM given:    Discharge Disposition:  HOME W HOME HEALTH SERVICES  Per UR Regulation:  Reviewed for med. necessity/level of care/duration of stay  Comments:  01/24/11 Laurianne Floresca,RN,BSN 1445 PT FOR DISCHARGE IN 1-2 DAYS HOME WITH HUSBAND.  PT NOT A CANDIDATE FOR REHAB, AS SHE IS DOING TOO WELL AND IS SAFE TO GO HOME WITH HOME HEALTH.  WILL NEED HOME HEALTH RN, PT, OT, AND DME.  REFERRAL TO [facility], PER PT CHOICE AND [place] FOR DME NEEDS.  START OF CARE 24-48H POST DC DATE.  [facility] TO CONTACT PT'S HUSBAND JOSEPH ON CELL [phone], REGARDING DELIVERY INFO. Phone #[phone]  01-22-11 8:30am Avie Arenas, RNBSN - [phone] UR Completed.  01-19-11 10:45am Johny Shears - [phone] UR Completed.  01/17/11 Vaishnavi Dalby,RN,BSN 1510 PHYSICAL THERAPIST'S RECOMMENDATIONS ARE FOR INPT REHAB. OBTAINED ORDER FOR REHAB  CONSULT FROM PA.  PT HAS HUSBAND, FAMILY TO ASSIST AT DISCHARGE.  WILL FOLLOW FOR RESULTS OF CONSULT.  01-17-11 10:15am Avie Arenas, RNBSN [phone] UR Completed.  01-11-11 1506 Tomi Bamberger, RN,BSN [phone] CM will continue to f/u for additional d/c plans.

## 2011-01-24 NOTE — Progress Notes (Signed)
CARDIAC REHAB PHASE I   PRE:  Rate/Rhythm: 64 SR    BP: sitting 133/60    SaO2: 94 RA  MODE:  Ambulation: 690 ft   POST:  Rate/Rhythm: 70    BP: sitting 143/49     SaO2: 95 RA  Tolerated well with RW, assist x1.  x3 rest stops for fatigue.  Sts legs fatigue first but this is progressively improving.  To recliner.  [phone]  Harriet Masson CES, ACSM

## 2011-01-25 LAB — GLUCOSE, CAPILLARY
Glucose-Capillary: 141 mg/dL — ABNORMAL HIGH (ref 70–99)
Glucose-Capillary: 166 mg/dL — ABNORMAL HIGH (ref 70–99)

## 2011-01-25 LAB — PRO B NATRIURETIC PEPTIDE: Pro B Natriuretic peptide (BNP): 3283 pg/mL — ABNORMAL HIGH (ref 0–125)

## 2011-01-25 MED ORDER — FUROSEMIDE 40 MG PO TABS: 40.0000 mg | ORAL_TABLET | Freq: Every day | ORAL | Status: DC

## 2011-01-25 MED ORDER — FUROSEMIDE 40 MG PO TABS
40.0000 mg | ORAL_TABLET | Freq: Two times a day (BID) | ORAL | Status: DC
  Administered 2011-01-25: 40 mg via ORAL
  Filled 2011-01-25 (×3): qty 1

## 2011-01-25 MED ORDER — POTASSIUM CHLORIDE CRYS ER 10 MEQ PO TBCR: 10.0000 meq | EXTENDED_RELEASE_TABLET | Freq: Every day | ORAL | Status: DC

## 2011-01-25 NOTE — Progress Notes (Signed)
EPW removed per MD order and policy procedure.  Sites painted with betadine, to ectopy or bleeding noted, VSS, pt tolerated well.  Pt to remain in bed x 1 hr, frequent vital assessment initiated per protocol.  Will continue to monitor closely. Ave Filter

## 2011-01-25 NOTE — Progress Notes (Signed)
Pt discharged with husband via volunteer services in wheelchair.  Discharge instructions, medications, and follow up appointments reviewed with pt and husband, no questions or concerns. Ave Filter

## 2011-01-25 NOTE — Progress Notes (Signed)
14 Days Post-Op Procedure(s) (LRB): CORONARY ARTERY BYPASS GRAFTING (CABG) (N/A)  Subjective: Patient has some pain in her left hamstring (although less so this am).She thinks she "pulled a muscle" yeserday.  She ambulated several times and thinks she "over did it".  Objective: Vital signs in last 24 hours: Patient Vitals for the past 24 hrs:  BP Temp Temp src Pulse Resp SpO2 Weight  01/25/11 0518 105/55 mmHg 97.8 F (36.6 C) Oral 61  18  93 % 194 lb 9.6 oz (88.27 kg)  01/24/11 2100 139/67 mmHg 98.2 F (36.8 C) Oral 65  20  97 % -  01/24/11 1408 93/50 mmHg 97.7 F (36.5 C) Oral 64  18  92 % -   Pre op weight  82.2 kg Current Weight  01/25/11 194 lb 9.6 oz (88.27 kg)       Intake/Output from previous day: 11/28 0701 - 11/29 0700 In: 840 [P.O.:840] Out: -    Physical Exam:  Cardiovascular: RRR, no murmurs, gallops, or rubs. Pulmonary: Decreased at bases  bilaterally R>L; no rales, wheezes, or rhonchi. Abdomen: Soft, non tender, bowel sounds present. Extremities: Bilateral lower extremity edema (but is decreasing) Wounds: Clean and dry.  No erythema or signs of infection.  Minor tenderness left inner thigh/hamstring area.  There is swelling of both thighs (L>R) secondary to [facility] harvest.  Lab Results: CBC:  Basename 01/24/11 0504  WBC 6.1  HGB 7.9*  HCT 24.2*  PLT 157   BMET:   Basename 01/24/11 0504 01/23/11 0500  NA 134* 137  K 4.7 4.8  CL 96 97  CO2 30 31  GLUCOSE 138* 111*  BUN 49* 44*  CREATININE 1.24* 1.24*  CALCIUM 8.6 8.7    PT/INR: No results found for this basename: LABPROT,INR in the last 72 hours ABG:  INR: Will add last result for INR, ABG once components are confirmed Will add last 4 CBG results once components are confirmed  Assessment/Plan:  1. CV - Previous Afib (s/p D/C cardioversion) and Apaced.  Was on Amiodarone 400 bid. HR has remained around 60.  Amiodarone has been decreased to 200 bid.Continue Lisinopril 10.   Not on a BB  secondary to bradycardia. 2.  Pulmonary - Encourage incentive spirometer,flutter valve. On RA this am. 3. Volume Overload - Diuresing well with Lasix 40 bid. Will continue.   4.  Acute blood loss anemia - H/H slightly decreased to 7.9/24.2.Not symptomatic.Continue Nu Iron. 5.DM-CBGs 143/161/141.Continue Metformin 500 bid.  Continue Lantus insulin bid.  6.Thrombocytopenia resolved-Plts now 157,000. 7.Cr stable at  1.24. 8.Remove EPW. 9.Possibly d/c later today.      [name] M, PA 01/25/2011   I have seen and examined [name] and agree with the above assessment  and plan.  Delight Ovens MD 01/25/2011 7:27 AM

## 2011-01-25 NOTE — Progress Notes (Signed)
Pt chest tube sutures removed per MD order and per facility policy. Steri strips applied. Pt tolerated well. Pt educated. Will continue to monitor. Kalman Drape, RN

## 2011-01-25 NOTE — Progress Notes (Signed)
Cardiac Rehab Phase 1  [phone] - [phone] Education done for D/C with understanding. D/C video on for pt. Pt refused out pt card rehab - states has done before and will do exercises at home.   Rosalie Doctor

## 2011-01-25 NOTE — Progress Notes (Signed)
Pt complaint of left inner thigh pain located midway with a 4" diameter. Stated it has been bothering he for 3 days (after she walked) but now is a 8/10 "constant sharp pain". Stated "it feels like a pulled muscle and that its hard to straighten her leg".  There is no redness or heat over the area or the rest of her legs; both legs are really tight.  Gave Tramadol 100mg  at 0100. Informed Dr. Charm Barges of findings and concern, leg will be addressed during rounding.  Pt was asleep at 0120 during pain recheck.  Will continue to monitor. Kalman Drape, RN

## 2011-01-29 ENCOUNTER — Other Ambulatory Visit: Payer: Self-pay

## 2011-01-29 ENCOUNTER — Inpatient Hospital Stay (HOSPITAL_COMMUNITY): Payer: Managed Care, Other (non HMO)

## 2011-01-29 ENCOUNTER — Encounter: Payer: Self-pay | Admitting: Adult Health

## 2011-01-29 ENCOUNTER — Encounter (INDEPENDENT_AMBULATORY_CARE_PROVIDER_SITE_OTHER): Payer: Managed Care, Other (non HMO) | Admitting: Adult Health

## 2011-01-29 ENCOUNTER — Inpatient Hospital Stay (HOSPITAL_COMMUNITY)
Admission: AD | Admit: 2011-01-29 | Discharge: 2011-02-06 | DRG: 292 | Disposition: A | Discharge status: Home / Self Care | Payer: Managed Care, Other (non HMO) | Source: Ambulatory Visit | Attending: Internal Medicine | Admitting: Internal Medicine

## 2011-01-29 ENCOUNTER — Telehealth: Payer: Self-pay | Admitting: Cardiology

## 2011-01-29 ENCOUNTER — Encounter: Payer: Self-pay | Admitting: *Deleted

## 2011-01-29 VITALS — BP 95/58 | HR 55 | Ht 65.0 in | Wt 196.0 lb

## 2011-01-29 DIAGNOSIS — D649 Anemia, unspecified: Secondary | ICD-10-CM

## 2011-01-29 DIAGNOSIS — I2510 Atherosclerotic heart disease of native coronary artery without angina pectoris: Secondary | ICD-10-CM

## 2011-01-29 DIAGNOSIS — E782 Mixed hyperlipidemia: Secondary | ICD-10-CM | POA: Diagnosis present

## 2011-01-29 DIAGNOSIS — F329 Major depressive disorder, single episode, unspecified: Secondary | ICD-10-CM

## 2011-01-29 DIAGNOSIS — R0989 Other specified symptoms and signs involving the circulatory and respiratory systems: Secondary | ICD-10-CM

## 2011-01-29 DIAGNOSIS — R001 Bradycardia, unspecified: Secondary | ICD-10-CM | POA: Diagnosis present

## 2011-01-29 DIAGNOSIS — I6529 Occlusion and stenosis of unspecified carotid artery: Secondary | ICD-10-CM

## 2011-01-29 DIAGNOSIS — E875 Hyperkalemia: Secondary | ICD-10-CM | POA: Diagnosis present

## 2011-01-29 DIAGNOSIS — K5730 Diverticulosis of large intestine without perforation or abscess without bleeding: Secondary | ICD-10-CM

## 2011-01-29 DIAGNOSIS — I428 Other cardiomyopathies: Secondary | ICD-10-CM | POA: Insufficient documentation

## 2011-01-29 DIAGNOSIS — E785 Hyperlipidemia, unspecified: Secondary | ICD-10-CM

## 2011-01-29 DIAGNOSIS — E1142 Type 2 diabetes mellitus with diabetic polyneuropathy: Secondary | ICD-10-CM | POA: Diagnosis present

## 2011-01-29 DIAGNOSIS — I10 Essential (primary) hypertension: Secondary | ICD-10-CM | POA: Diagnosis present

## 2011-01-29 DIAGNOSIS — E1149 Type 2 diabetes mellitus with other diabetic neurological complication: Secondary | ICD-10-CM | POA: Diagnosis present

## 2011-01-29 DIAGNOSIS — N179 Acute kidney failure, unspecified: Secondary | ICD-10-CM | POA: Diagnosis present

## 2011-01-29 DIAGNOSIS — I5023 Acute on chronic systolic (congestive) heart failure: Principal | ICD-10-CM | POA: Diagnosis present

## 2011-01-29 DIAGNOSIS — M129 Arthropathy, unspecified: Secondary | ICD-10-CM

## 2011-01-29 DIAGNOSIS — K7689 Other specified diseases of liver: Secondary | ICD-10-CM

## 2011-01-29 DIAGNOSIS — I452 Bifascicular block: Secondary | ICD-10-CM

## 2011-01-29 DIAGNOSIS — R7989 Other specified abnormal findings of blood chemistry: Secondary | ICD-10-CM

## 2011-01-29 DIAGNOSIS — I4891 Unspecified atrial fibrillation: Secondary | ICD-10-CM

## 2011-01-29 DIAGNOSIS — I200 Unstable angina: Secondary | ICD-10-CM

## 2011-01-29 DIAGNOSIS — R161 Splenomegaly, not elsewhere classified: Secondary | ICD-10-CM

## 2011-01-29 DIAGNOSIS — D500 Iron deficiency anemia secondary to blood loss (chronic): Secondary | ICD-10-CM | POA: Diagnosis present

## 2011-01-29 DIAGNOSIS — K7460 Unspecified cirrhosis of liver: Secondary | ICD-10-CM

## 2011-01-29 DIAGNOSIS — D696 Thrombocytopenia, unspecified: Secondary | ICD-10-CM | POA: Diagnosis not present

## 2011-01-29 DIAGNOSIS — I509 Heart failure, unspecified: Secondary | ICD-10-CM | POA: Diagnosis present

## 2011-01-29 DIAGNOSIS — I498 Other specified cardiac arrhythmias: Secondary | ICD-10-CM | POA: Diagnosis present

## 2011-01-29 DIAGNOSIS — K760 Fatty (change of) liver, not elsewhere classified: Secondary | ICD-10-CM

## 2011-01-29 DIAGNOSIS — I5021 Acute systolic (congestive) heart failure: Secondary | ICD-10-CM

## 2011-01-29 LAB — COMPREHENSIVE METABOLIC PANEL
ALT: 31 U/L (ref 0–35)
AST: 33 U/L (ref 0–37)
CO2: 24 mEq/L (ref 19–32)
Calcium: 9.5 mg/dL (ref 8.4–10.5)
GFR calc non Af Amer: 12 mL/min — ABNORMAL LOW (ref >90)
Sodium: 130 mEq/L — ABNORMAL LOW (ref 135–145)
Total Protein: 7.6 g/dL (ref 6.0–8.3)

## 2011-01-29 LAB — DIFFERENTIAL
Eosinophils Relative: 2 % (ref 0–5)
Lymphocytes Relative: 19 % (ref 12–46)
Lymphs Abs: 2 10*3/uL (ref 0.7–4.0)
Monocytes Relative: 8 % (ref 3–12)
Neutrophils Relative %: 71 % (ref 43–77)

## 2011-01-29 LAB — BASIC METABOLIC PANEL
BUN: 107 mg/dL — ABNORMAL HIGH (ref 6–23)
Calcium: 9.2 mg/dL (ref 8.4–10.5)
GFR calc Af Amer: 13 mL/min — ABNORMAL LOW (ref >90)
GFR calc non Af Amer: 11 mL/min — ABNORMAL LOW (ref >90)
Glucose, Bld: 273 mg/dL — ABNORMAL HIGH (ref 70–99)

## 2011-01-29 LAB — CBC
Hemoglobin: 9.5 g/dL — ABNORMAL LOW (ref 12.0–15.0)
MCV: 94.5 fL (ref 78.0–100.0)
Platelets: 333 10*3/uL (ref 150–400)
RBC: 3.08 MIL/uL — ABNORMAL LOW (ref 3.87–5.11)
WBC: 10.6 10*3/uL — ABNORMAL HIGH (ref 4.0–10.5)

## 2011-01-29 LAB — CARDIAC PANEL(CRET KIN+CKTOT+MB+TROPI)
Relative Index: INVALID (ref 0.0–2.5)
Total CK: 47 U/L (ref 7–177)
Troponin I: <0.3 ng/mL (ref <0.30)

## 2011-01-29 LAB — NA AND K (SODIUM & POTASSIUM), RAND UR: Sodium, Ur: 25 mEq/L

## 2011-01-29 LAB — MAGNESIUM: Magnesium: 2.5 mg/dL (ref 1.5–2.5)

## 2011-01-29 LAB — GLUCOSE, CAPILLARY: Glucose-Capillary: 131 mg/dL — ABNORMAL HIGH (ref 70–99)

## 2011-01-29 MED ORDER — SENNOSIDES-DOCUSATE SODIUM 8.6-50 MG PO TABS: 1.0000 | ORAL_TABLET | Freq: Every evening | ORAL | Status: DC | PRN

## 2011-01-29 MED ORDER — CALCIUM CHLORIDE 10 % IV SOLN: 1.0000 g | Freq: Once | INTRAVENOUS | Status: DC

## 2011-01-29 MED ORDER — ENOXAPARIN SODIUM 40 MG/0.4ML ~~LOC~~ SOLN: 40.0000 mg | SUBCUTANEOUS | Status: DC

## 2011-01-29 MED ORDER — INSULIN REGULAR BOLUS VIA INFUSION: 10.0000 [IU] | Freq: Once | INTRAVENOUS | Status: DC

## 2011-01-29 MED ORDER — DOCUSATE SODIUM 100 MG PO CAPS
100.0000 mg | ORAL_CAPSULE | Freq: Two times a day (BID) | ORAL | Status: DC
  Administered 2011-01-31 – 2011-02-06 (×13): 100 mg via ORAL
  Filled 2011-01-29 (×13): qty 1

## 2011-01-29 MED ORDER — AMIODARONE HCL 200 MG PO TABS: 200.0000 mg | ORAL_TABLET | Freq: Every day | ORAL | Status: DC

## 2011-01-29 MED ORDER — ONDANSETRON HCL 4 MG/2ML IJ SOLN
4.0000 mg | Freq: Four times a day (QID) | INTRAMUSCULAR | Status: DC | PRN
  Administered 2011-01-29: 4 mg via INTRAVENOUS
  Filled 2011-01-29: qty 2

## 2011-01-29 MED ORDER — CALCIUM GLUCONATE 10 % IV SOLN: 1.0000 g | Freq: Once | INTRAVENOUS | Status: DC

## 2011-01-29 MED ORDER — SODIUM CHLORIDE 0.9 % IV SOLN
INTRAVENOUS | Status: AC
  Administered 2011-01-29: 1 g via INTRAVENOUS
  Filled 2011-01-29: qty 10

## 2011-01-29 MED ORDER — INSULIN GLARGINE 100 UNIT/ML ~~LOC~~ SOLN: 20.0000 [IU] | Freq: Two times a day (BID) | SUBCUTANEOUS | Status: DC

## 2011-01-29 MED ORDER — FUROSEMIDE 10 MG/ML IJ SOLN
INTRAMUSCULAR | Status: AC
  Filled 2011-01-29: qty 10

## 2011-01-29 MED ORDER — DEXTROSE 50 % IV SOLN
INTRAVENOUS | Status: AC
  Administered 2011-01-29: 25 g via INTRAVENOUS
  Filled 2011-01-29: qty 50

## 2011-01-29 MED ORDER — ALBUTEROL SULFATE (5 MG/ML) 0.5% IN NEBU: 2.5000 mg | INHALATION_SOLUTION | RESPIRATORY_TRACT | Status: DC | PRN

## 2011-01-29 MED ORDER — ASPIRIN 325 MG PO TABS
325.0000 mg | ORAL_TABLET | Freq: Every day | ORAL | Status: DC
  Administered 2011-01-30 – 2011-02-06 (×8): 325 mg via ORAL
  Filled 2011-01-29 (×8): qty 1

## 2011-01-29 MED ORDER — FUROSEMIDE 10 MG/ML IJ SOLN
100.0000 mg | Freq: Once | INTRAVENOUS | Status: AC
  Administered 2011-01-29: 100 mg via INTRAVENOUS
  Filled 2011-01-29: qty 10

## 2011-01-29 MED ORDER — HYDROMORPHONE HCL PF 1 MG/ML IJ SOLN
0.5000 mg | INTRAMUSCULAR | Status: DC | PRN
  Administered 2011-02-02: 0.5 mg via INTRAVENOUS
  Filled 2011-01-29: qty 1

## 2011-01-29 MED ORDER — INSULIN REGULAR HUMAN 100 UNIT/ML IJ SOLN: 6.0000 [IU] | Freq: Once | INTRAMUSCULAR | Status: DC

## 2011-01-29 MED ORDER — FUROSEMIDE 10 MG/ML IJ SOLN
100.0000 mg | Freq: Two times a day (BID) | INTRAVENOUS | Status: DC
  Filled 2011-01-29: qty 10

## 2011-01-29 MED ORDER — PAROXETINE HCL 20 MG PO TABS
20.0000 mg | ORAL_TABLET | ORAL | Status: DC
  Administered 2011-01-30: 20 mg via ORAL
  Filled 2011-01-29: qty 1

## 2011-01-29 MED ORDER — SODIUM CHLORIDE 0.9 % IJ SOLN
10.0000 mL | Freq: Two times a day (BID) | INTRAMUSCULAR | Status: DC
  Administered 2011-01-29 – 2011-02-05 (×14): 10 mL
  Filled 2011-01-29 (×8): qty 3
  Filled 2011-01-29 (×2): qty 6
  Filled 2011-01-29 (×3): qty 3

## 2011-01-29 MED ORDER — INSULIN GLARGINE 100 UNIT/ML ~~LOC~~ SOLN: 35.0000 [IU] | Freq: Two times a day (BID) | SUBCUTANEOUS | Status: DC

## 2011-01-29 MED ORDER — INSULIN ASPART 100 UNIT/ML ~~LOC~~ SOLN: 6.0000 [IU] | Freq: Once | SUBCUTANEOUS | Status: DC

## 2011-01-29 MED ORDER — FUROSEMIDE 10 MG/ML IJ SOLN: 40.0000 mg | Freq: Three times a day (TID) | INTRAMUSCULAR | Status: DC

## 2011-01-29 MED ORDER — METFORMIN HCL 500 MG PO TABS: 1000.0000 mg | ORAL_TABLET | Freq: Two times a day (BID) | ORAL | Status: DC

## 2011-01-29 MED ORDER — DOPAMINE-DEXTROSE 3.2-5 MG/ML-% IV SOLN
5.0000 ug/kg/min | INTRAVENOUS | Status: DC
  Administered 2011-01-29 – 2011-01-30 (×2): 5 ug/kg/min via INTRAVENOUS
  Filled 2011-01-29 (×2): qty 250

## 2011-01-29 MED ORDER — SODIUM BICARBONATE 8.4 % IV SOLN
50.0000 meq | Freq: Once | INTRAVENOUS | Status: AC
  Administered 2011-01-29: 50 meq via INTRAVENOUS

## 2011-01-29 MED ORDER — INSULIN REGULAR HUMAN 100 UNIT/ML IJ SOLN
INTRAMUSCULAR | Status: AC
  Filled 2011-01-29: qty 1

## 2011-01-29 MED ORDER — ONDANSETRON HCL 4 MG PO TABS: 4.0000 mg | ORAL_TABLET | Freq: Four times a day (QID) | ORAL | Status: DC | PRN

## 2011-01-29 MED ORDER — INSULIN ASPART 100 UNIT/ML ~~LOC~~ SOLN
SUBCUTANEOUS | Status: AC
  Filled 2011-01-29: qty 3

## 2011-01-29 MED ORDER — GABAPENTIN 300 MG PO CAPS
300.0000 mg | ORAL_CAPSULE | Freq: Three times a day (TID) | ORAL | Status: DC
  Administered 2011-01-29 – 2011-01-30 (×4): 300 mg via ORAL
  Filled 2011-01-29 (×3): qty 1

## 2011-01-29 MED ORDER — SODIUM CHLORIDE 0.9 % IV SOLN
INTRAVENOUS | Status: DC
  Administered 2011-01-29: 20:00:00 via INTRAVENOUS
  Administered 2011-01-30: 125 mL/h via INTRAVENOUS
  Administered 2011-01-30: 75 mL/h via INTRAVENOUS
  Administered 2011-01-31: 08:00:00 via INTRAVENOUS

## 2011-01-29 MED ORDER — SODIUM BICARBONATE 8.4 % IV SOLN
INTRAVENOUS | Status: AC
  Administered 2011-01-29: 50 meq via INTRAVENOUS
  Filled 2011-01-29: qty 50

## 2011-01-29 MED ORDER — FOLIC ACID 1 MG PO TABS
1.0000 mg | ORAL_TABLET | Freq: Every day | ORAL | Status: DC
  Administered 2011-01-29 – 2011-01-30 (×2): 1 mg via ORAL
  Filled 2011-01-29 (×2): qty 1

## 2011-01-29 MED ORDER — INSULIN ASPART 100 UNIT/ML ~~LOC~~ SOLN
0.0000 [IU] | Freq: Three times a day (TID) | SUBCUTANEOUS | Status: DC
  Administered 2011-01-30: 1 [IU] via SUBCUTANEOUS
  Administered 2011-01-30: 2 [IU] via SUBCUTANEOUS
  Administered 2011-01-31: 1 [IU] via SUBCUTANEOUS
  Administered 2011-01-31: 2 [IU] via SUBCUTANEOUS
  Administered 2011-01-31: 3 [IU] via SUBCUTANEOUS
  Administered 2011-02-01: 2 [IU] via SUBCUTANEOUS
  Administered 2011-02-01: 1 [IU] via SUBCUTANEOUS
  Administered 2011-02-01 – 2011-02-02 (×2): 3 [IU] via SUBCUTANEOUS
  Administered 2011-02-02 (×2): 2 [IU] via SUBCUTANEOUS
  Administered 2011-02-03 (×2): 3 [IU] via SUBCUTANEOUS
  Administered 2011-02-03: 5 [IU] via SUBCUTANEOUS
  Administered 2011-02-04 – 2011-02-05 (×5): 2 [IU] via SUBCUTANEOUS
  Administered 2011-02-05: 3 [IU] via SUBCUTANEOUS
  Administered 2011-02-06: 2 [IU] via SUBCUTANEOUS
  Administered 2011-02-06 (×2): 3 [IU] via SUBCUTANEOUS
  Filled 2011-01-29: qty 3

## 2011-01-29 MED ORDER — CLOPIDOGREL BISULFATE 75 MG PO TABS: 75.0000 mg | ORAL_TABLET | Freq: Every day | ORAL | Status: DC

## 2011-01-29 MED ORDER — SODIUM POLYSTYRENE SULFONATE 15 GM/60ML PO SUSP
30.0000 g | ORAL | Status: DC
  Administered 2011-01-29: 30 g via ORAL
  Filled 2011-01-29: qty 120

## 2011-01-29 MED ORDER — SODIUM CHLORIDE 0.9 % IJ SOLN
10.0000 mL | INTRAMUSCULAR | Status: DC | PRN
  Filled 2011-01-29 (×6): qty 3

## 2011-01-29 MED ORDER — DEXTROSE 50 % IV SOLN
25.0000 g | Freq: Once | INTRAVENOUS | Status: AC
  Administered 2011-01-29: 25 g via INTRAVENOUS

## 2011-01-29 MED ORDER — SIMVASTATIN 20 MG PO TABS
20.0000 mg | ORAL_TABLET | Freq: Every day | ORAL | Status: DC
  Administered 2011-01-29 – 2011-02-05 (×8): 20 mg via ORAL
  Filled 2011-01-29 (×8): qty 1

## 2011-01-29 MED ORDER — ENOXAPARIN SODIUM 40 MG/0.4ML ~~LOC~~ SOLN: 40.0000 mg | Freq: Every day | SUBCUTANEOUS | Status: DC

## 2011-01-29 MED ORDER — SODIUM POLYSTYRENE SULFONATE 15 GM/60ML PO SUSP
45.0000 g | Freq: Once | ORAL | Status: AC
  Administered 2011-01-29: 45 g via ORAL
  Filled 2011-01-29: qty 180

## 2011-01-29 MED ORDER — INSULIN ASPART 100 UNIT/ML ~~LOC~~ SOLN
10.0000 [IU] | Freq: Once | SUBCUTANEOUS | Status: AC
  Administered 2011-01-29: 10 [IU] via INTRAVENOUS

## 2011-01-29 MED ORDER — ACETAMINOPHEN 325 MG PO TABS: 650.0000 mg | ORAL_TABLET | Freq: Four times a day (QID) | ORAL | Status: DC | PRN

## 2011-01-29 MED ORDER — GUAIFENESIN-DM 100-10 MG/5ML PO SYRP: 5.0000 mL | ORAL_SOLUTION | ORAL | Status: DC | PRN

## 2011-01-29 MED ORDER — INSULIN ASPART 100 UNIT/ML ~~LOC~~ SOLN: 6.0000 [IU] | Freq: Three times a day (TID) | SUBCUTANEOUS | Status: DC

## 2011-01-29 MED ORDER — TRAMADOL HCL 50 MG PO TABS
50.0000 mg | ORAL_TABLET | Freq: Four times a day (QID) | ORAL | Status: DC | PRN
  Administered 2011-01-31 – 2011-02-05 (×2): 50 mg via ORAL
  Filled 2011-01-29 (×2): qty 1

## 2011-01-29 MED ORDER — INSULIN ASPART 100 UNIT/ML ~~LOC~~ SOLN: 10.0000 [IU] | Freq: Once | SUBCUTANEOUS | Status: DC

## 2011-01-29 MED ORDER — POTASSIUM CHLORIDE CRYS ER 10 MEQ PO TBCR: 10.0000 meq | EXTENDED_RELEASE_TABLET | Freq: Every day | ORAL | Status: DC

## 2011-01-29 MED ORDER — ALUM & MAG HYDROXIDE-SIMETH 200-200-20 MG/5ML PO SUSP: 30.0000 mL | Freq: Four times a day (QID) | ORAL | Status: DC | PRN

## 2011-01-29 MED ORDER — FUROSEMIDE 10 MG/ML IJ SOLN: 40.0000 mg | Freq: Four times a day (QID) | INTRAMUSCULAR | Status: DC

## 2011-01-29 MED ORDER — SODIUM CHLORIDE 0.9 % IJ SOLN
INTRAMUSCULAR | Status: AC
  Administered 2011-01-29: 10 mL
  Filled 2011-01-29: qty 12

## 2011-01-29 MED ORDER — POLYSACCHARIDE IRON 150 MG PO CAPS
150.0000 mg | ORAL_CAPSULE | Freq: Every day | ORAL | Status: DC
  Administered 2011-01-30 – 2011-02-06 (×8): 150 mg via ORAL
  Filled 2011-01-29 (×8): qty 1

## 2011-01-29 MED ORDER — SODIUM CHLORIDE 0.9 % IV SOLN
1.0000 g | Freq: Once | INTRAVENOUS | Status: AC
  Administered 2011-01-29: 1 g via INTRAVENOUS
  Filled 2011-01-29: qty 10

## 2011-01-29 MED ORDER — SODIUM CHLORIDE 0.9 % IJ SOLN
INTRAMUSCULAR | Status: AC
  Filled 2011-01-29: qty 3

## 2011-01-29 MED ORDER — SIMVASTATIN 20 MG PO TABS: 20.0000 mg | ORAL_TABLET | Freq: Every day | ORAL | Status: DC

## 2011-01-29 NOTE — Procedures (Signed)
Central Venous Catheter Insertion Procedure Note EARA BURRUEL [id number] Nov 10, 1950  Procedure: Insertion of Central Venous Catheter Indications: Drug and/or fluid administration and Frequent blood sampling  Procedure Details Consent: Risks of procedure as well as the alternatives and risks of each were explained to the (patient/caregiver).  Consent for procedure obtained. Time Out: Verified patient identification, verified procedure, site/side was marked, verified correct patient position, special equipment/implants available, medications/allergies/relevent history reviewed, required imaging and test results available.  Performed  Maximum sterile technique was used including gloves, gown, hand hygiene, mask and sheet. Skin prep: Chlorhexidine; local anesthetic administered A antimicrobial bonded/coated triple lumen catheter was placed in the left subclavian vein using the Seldinger technique.  Evaluation Blood flow good Complications: No apparent complications Patient did tolerate procedure well. Chest X-ray ordered to verify placement.  CXR: pending.  Shealyn Sean A 01/29/2011, 6:45 PM

## 2011-01-29 NOTE — Progress Notes (Signed)
Lab called with critical lab result-potassium level of 7. Made Dr. Lendell Caprice aware. New orders for [facility] to be administered.

## 2011-01-29 NOTE — Telephone Encounter (Signed)
PT HAS GAIN 4 LBS SINCE 11/30. SHE IS ON LASIX 40 MG. HER SUGAR HAS BEEN HIGH. SHE HAS ALSO BEEN SLIGHTLY SOB. SHE HAS NO ENERGY ALL SHE WANTS TO DO IS SLEEP.

## 2011-01-29 NOTE — Consult Note (Addendum)
CARDIOLOGY CONSULT NOTE  Patient ID: [name] MRN: [id number] DOB/AGE: [birth year] 61 y.o.  Admit date: 01/29/2011 Referring Physician:[email or web address] Primary PhysicianFALGUI,VICENTE T, MD Primary Cardiologist: Diona Browner Reason for Consultation: Acute Systolic CHF Principal Problem:  *Systolic CHF Active Problems:  Coronary atherosclerosis of native coronary artery  Cardiomyopathy, nonischemic  HPI: [name] is a 60 y/o patient of Dr. Diona Browner with recent history of native vessel CAD requiring CABG. She was seen originally by Dr. Diona Browner for unstable angina on 01/04/2011 for the first time. She was subsequently sent to [facility] for cardiac catheterization which showed severe 2 vessel CAD with 95% ostial LAD, 95% mid diag stenosis, RCA with focal mid 80% stenosis between 2 non overlapping stents  Stents to mid LAD patent along with those in the RCA were patent.. EF was 65%.  She had 4 vessel CABG per Dr. Morton Peters on 01/11/2011.    She had a complicated post-operative recovery period to include Atrial fibrillation, SVT, and hypotension. She was placed on amiodarone and for a time was on inotrope therapy.She was cardioverted a total of 4 times. She was started on coreg, but could not tolerate this secondary to bradycardia, and required a transvenous pacemaker.She returned to NSR.    She was also found to be anemic during that admission requiring 2 units of PRBC's tranfused. Repeat echo prior to discharge showed EF of 40-45% with a small pericardial effusion, after post operative echo revealed EF of 30%. .She was also treated for pneumonia.     She came to our office today at the request of her husband with complaints of dyspnea at rest, orthopnea, generalized edema and chest pressure with generalized pain in her extremities. She is pale and weak.  Review of records during discharge shows a Hgb of 7.9.. Creatinine of 1.24.   Review of systems complete and found to be negative unless listed above   Past  Medical History  Diagnosis Date  . Coronary atherosclerosis of native coronary artery     BMS circ 2001, DES LAD 2005, DES LAD and RCA 2008  . Drug allergy     Allergy to Plavix (hives) - took Ticlid in past  . Essential hypertension, benign   . Mixed hyperlipidemia   . Type 2 diabetes mellitus   . Breast cancer     Chemo and mastectomy  . Angina   . Myocardial infarction ~ 2002  . Diabetes mellitus   . Shortness of breath 01/05/11     "w/exertion"  . Arthritis     rheumatoid  . Depression   . Peripheral neuropathy     "from diabetes"  . Peptic ulcer disease ~ 1975    No family history on file.  History   Social History  . Marital Status: Married    Spouse Name: N/A    Number of Children: N/A  . Years of Education: N/A   Occupational History  . Not on file.   Social History Main Topics  . Smoking status: Former Smoker -- 1.0 packs/day for 20 years    Types: Cigarettes    Quit date: 02/27/2007  . Smokeless tobacco: Never Used  . Alcohol Use: No  . Drug Use: No  . Sexually Active: Not on file   Other Topics Concern  . Not on file   Social History Narrative  . No narrative on file    Past Surgical History  Procedure Date  . Right mastectomy   . Mastectomy 1988    right  .  Cholecystectomy 1969  . Abdominal hysterectomy 1989  . Tonsillectomy and adenoidectomy 1979  . Appendectomy 1969  . Tubal ligation 1972  . Coronary angioplasty with stent placement 4 Times    "had 2 stents each time; last time 2010"  . Cardiac catheterization 01/05/11    "no stents"  . Coronary artery bypass graft 01/11/2011    Procedure: CORONARY ARTERY BYPASS GRAFTING (CABG);  Surgeon: Kathlee Nations Suann Larry, MD;  Location: [facility] OR;  Service: Open Heart Surgery;  Laterality: N/A;  cabg x four, using left internal mammary artery and right leg greater saphenous vein harvestede endoscopically     Prescriptions prior to admission  Medication Sig Dispense Refill  . amiodarone (PACERONE) 200  MG tablet Take 1 tablet (200 mg total) by mouth 2 (two) times daily.  60 tablet  1  . aspirin 325 MG tablet Take 325 mg by mouth daily.        . folic acid (FOLVITE) 1 MG tablet Take 1 tablet (1 mg total) by mouth daily. For one month then may stop.      . furosemide (LASIX) 40 MG tablet Take 1 tablet (40 mg total) by mouth daily. For  30 tablet  0  . gabapentin (NEURONTIN) 300 MG capsule Take 300 mg by mouth 3 (three) times daily.        . insulin glargine (LANTUS) 100 UNIT/ML injection Inject 30 Units into the skin 2 (two) times daily. Patient to gradually increase her insulin glargine to pre op dose of 100 units [place] two times daily.  Contact medical doctor for any questions.  10 mL    . insulin lispro (HUMALOG) 100 UNIT/ML injection Inject 2 Units into the skin 3 (three) times daily before meals. Patient to gradually increase insulin lispro to the pre op dose of 84 units [place] three times daily.  Contact medical doctor for any questions.  10 mL    . lisinopril (PRINIVIL,ZESTRIL) 10 MG tablet Take 10 mg by mouth daily.        . metFORMIN (GLUCOPHAGE) 500 MG tablet Take 500 mg by mouth 2 (two) times daily with a meal.        . metoprolol (LOPRESSOR) 50 MG tablet Take 50 mg by mouth 2 (two) times daily.        Marland Kitchen PARoxetine (PAXIL) 20 MG tablet Take 20 mg by mouth every morning.        . polysaccharide iron (NIFEREX) 150 MG CAPS capsule Take 1 capsule (150 mg total) by mouth daily. For one month then may stop.  30 each  0  . potassium chloride (K-DUR,KLOR-CON) 10 MEQ tablet Take 1 tablet (10 mEq total) by mouth daily.  30 tablet  0  . simvastatin (ZOCOR) 20 MG tablet Take 20 mg by mouth at bedtime.        . traMADol (ULTRAM) 50 MG tablet Take 1-2 tablets (50-100 mg total) by mouth every 4 (four) hours as needed for pain. Maximum dose= 8 tablets per day  45 tablet  0    Physical Exam: General: Well developed, well nourished, short of breath and lethargic. Head: Eyes PERRLA, No xanthomas.   Normal  cephalic and atramatic  Lungs:Absent breath sound bibasilar with crackles in the middle lobes.  No wheezes. Heart: HRRR S1 S2 bradycardic, 1/6 systolic murmur.  Pulses are 2+ & equal.            No carotid bruit. Mild JVD at 12 cm..  No abdominal bruits. No femoral  bruits. Abdomen: Bowel sounds are positive, abdomen soft and non-tender without masses or                  Hernia's noted. Msk:  Back normal, normal gait. Diminished strength and tone for age. Extremities: 2+ pitting edema of the LE bilaterally to the thighs.   Neuro: Alert and oriented X 3. Psych:  Good affect, responds appropriately   Labs:   Lab Results  Component Value Date   WBC 6.1 01/24/2011   HGB 7.9* 01/24/2011   HCT 24.2* 01/24/2011   MCV 94.2 01/24/2011   PLT 157 01/24/2011     Lab 01/24/11 0504  NA 134*  K 4.7  CL 96  CO2 30  BUN 49*  CREATININE 1.24*  CALCIUM 8.6  PROT --  BILITOT --  ALKPHOS --  ALT --  AST --  GLUCOSE 138*   Lab Results  Component Value Date   CKTOTAL 559* 01/15/2011   CKMB 7.3* 01/15/2011   TROPONINI 8.88* 01/15/2011    Lab Results  Component Value Date   CHOL 146 01/06/2011   Lab Results  Component Value Date   HDL 35* 01/06/2011   Lab Results  Component Value Date   LDLCALC 78 01/06/2011   Lab Results  Component Value Date   TRIG 165* 01/06/2011   Lab Results  Component Value Date   CHOLHDL 4.2 01/06/2011   No results found for this basename: LDLDIRECT      Radiology:CXR pending  ZOX:WRUEA bradycardia, rate of 30 bpm. Bifascicular block. Anterior septal infarct.  ASSESSMENT AND PLAN:   1. Acute systolic CHF: She comes to the office with significant volume overload, with symptoms of orthopnea and chest pressure. She is short of breath at rest and very fatigued. She is hypotensive and bradycardic. I have discussed this with Dr. Diona Browner who agrees that this patient should be admitted from our office.  I have called Dr. Lendell Caprice to admit for [facility].  She  will be placed in the unit.for diureses and closer monitoring until she is hemodynamicallly stable. We will stop the metoprolol as she was unable to tolerate BB post-operatively at [facility], decrease pacerone to 200mg  daily. She will be placed on renal dose of dopamine for assistance with BP and HR. She will be followed closely.  2. CAD s/p 4 vessel CABG: She had a complicated post-operative period after CABG on 01/11/2011 with complications of blood loss anemia, afib, SVT and pneumonia. She complains of chest pressure but I believe this to be a combination of post-operative pain and CHF. Consider repeating echo at Dr. Desmond Dike discretion.   3. Bradycardia: She was unable to tolerate BB during post-operative period, however, she was placed on metoprolol along with pacerone on discharge.  Metoprolol will be stopped.  Do not think she requires a temporary pacemaker at this time.Will monitor closely for this. Dopamine should assist in HR.   Signed: Bettey Mare. Lyman Bishop NP [facility] 01/29/2011, 4:27 PM Co-Sign MD  Attending note:  Patient was seen and examined, discussed with Ms. Lawrence whose findings are outlined above. She was recently referred for a cardiac catheterization with symptoms of unstable angina, diagnosed with progressive multivessel CAD with history of multiple prior interventions, and ultimately underwent CABG by Dr. Donata Clay on November 15. Postoperative course was complicated by rapid atrial fibrillation/SVT requiring amiodarone, transient hypotension, and bradycardia on beta blockers. LVEF was noted to be approximately 40-45% by followup echocardiogram, and she was also anemic in  the postoperative setting requiring packed red cell transfusion.  She presents to the office today after calling [facility] reporting symptoms of significant weakness and shortness of breath, no chest pain or palpitations. She reports taking her medications as outlined. In review of the discharge summary,  she was apparently asked to resume metoprolol at previous home dosing, and was continued on amiodarone at 200 mg twice daily. She was also discharged on Lasix 40 mg daily. She reports having progressive lower extremity edema and orthopnea as well.  ECG is reviewed showing marked sinus bradycardia just under 40 beats per minute, nonspecific ST-T changes, bifascicular block. Lab work at recent discharge from the hospital showed potassium 4.7, BUN 49, creatinine 1.2, hemoglobin 7.9, platelets 157. Chest x-ray from 11/28 showed small pleural effusions with airspace disease, right worse than left.  The patient was admitted directly from our office today to the [facility] step down unit and the case was discussed with Dr. Lendell Caprice on the hospitalist team. General plan is to hold both amiodarone and metoprolol, institute intravenous diuresis, and also initiate dopamine infusion once adequate IV access is placed. She will need a PICC line or central line. Depending on followup blood work, consideration can be given to further packed red cell transfusion if needed. We will also obtain a set of cardiac markers, although doubt active ischemia at this time. She most likely has acute on chronic systolic heart failure, complicated by marked bradycardia and volume overload, which is presumably medication induced. She also has evidence of underlying conduction system disease, and will need to be careful to determine whether pacemaker placement may ultimately need to be considered, once medications have washed out. Of note, she has not been anticoagulated with recent atrial fibrillation in the postoperative setting.  Jonelle Sidle, M.D., F.A.C.C.

## 2011-01-29 NOTE — H&P (Addendum)
Hospital Admission Note Date: 01/29/2011  Patient name: [name] Medical record number: [id number] Date of birth: [birth year] Age: 60 y.o. Gender: female PCP: Leone Payor, MD  Attending physician: Christiane Ha  Chief Complaint: Short of breath and weak  History of Present Illness:  [name] is an 60 y.o. female who was directly admitted from Dr. Ival Bible office with bradycardia and CHF exacerbation. She had a coronary artery bypass graft last month at [facility] and had postoperative atrial fibrillation, SVT, anemia requiring transfusion. Apparently, she had to be cardioverted 4 times and was placed on an amiodarone drip and inotropic therapy. At one point she became bradycardic and required a transvenous pacemaker. She subsequently returned to normal sinus rhythm. Most recent echocardiogram on 01/23/2011 shows an ejection fraction of about 40%. She was discharged from the hospital just last week on ACE inhibitor, metoprolol, amiodarone, aspirin, potassium, and a few other medications. Since then, she has become progressively more short of breath, dyspneic on exertion, orthopnea, paroxysmal nocturnal dyspnea. Also, she has gained 4 pounds in 4 days. She was evaluated in the office and noted to be fluid overloaded and bradycardic with a heart rate of around 40. She was directly admitted from the office to the step down unit. Since then, lab work has come back in her potassium is 7.0. Creatinine is 3.84. BUN is 105. At discharge, on 11/27, her potassium was 4.7, BUN 49, creatinine 1.24. Pro BNP today is 12,000. At discharge, it was 3000. Chest x-ray showed increased by basilar infiltrate or atelectasis and unchanged bilateral pleural effusions. EKG shows sinus bradycardia with a rate about 38 and right bundle branch block, left anterior fascicular block, which is old, but new flipped T waves in the V. leads. Patient does not have IV access currently. She has incisional  pain but no anginal type chest pain.  Past Medical History  Diagnosis Date  . Coronary atherosclerosis of native coronary artery     BMS circ 2001, DES LAD 2005, DES LAD and RCA 2008  . Drug allergy     Allergy to Plavix (hives) - took Ticlid in past  . Essential hypertension, benign   . Mixed hyperlipidemia   . Type 2 diabetes mellitus   . Breast cancer     Chemo and mastectomy  . Angina   . Myocardial infarction ~ 2002  . Diabetes mellitus   . Shortness of breath 01/05/11     "w/exertion"  . Arthritis     rheumatoid  . Depression   . Peripheral neuropathy     "from diabetes"  . Peptic ulcer disease ~ 1975    Meds: Prescriptions prior to admission  Medication Sig Dispense Refill  . amiodarone (PACERONE) 200 MG tablet Take 1 tablet (200 mg total) by mouth 2 (two) times daily.  60 tablet  1  . aspirin 325 MG tablet Take 325 mg by mouth daily.        . folic acid (FOLVITE) 1 MG tablet Take 1 tablet (1 mg total) by mouth daily. For one month then may stop.      . furosemide (LASIX) 40 MG tablet Take 1 tablet (40 mg total) by mouth daily. For  30 tablet  0  . gabapentin (NEURONTIN) 300 MG capsule Take 300 mg by mouth 3 (three) times daily.        . insulin glargine (LANTUS) 100 UNIT/ML injection Inject 35 Units into the skin 2 (two) times daily. Patient to gradually increase  her insulin glargine to pre op dose of 100 units [place] two times daily.  Contact medical doctor for any questions.  10 mL    . insulin lispro (HUMALOG) 100 UNIT/ML injection Inject 6 Units into the skin 3 (three) times daily before meals. Patient to gradually increase insulin lispro to the pre op dose of 84 units [place] three times daily.  Contact medical doctor for any questions.  10 mL    . lisinopril (PRINIVIL,ZESTRIL) 10 MG tablet Take 10 mg by mouth daily.        . metFORMIN (GLUCOPHAGE) 500 MG tablet Take 500 mg by mouth 2 (two) times daily with a meal.        . metoprolol (LOPRESSOR) 50 MG tablet Take 50 mg by  mouth 2 (two) times daily.        Marland Kitchen PARoxetine (PAXIL) 20 MG tablet Take 20 mg by mouth every morning.        . polysaccharide iron (NIFEREX) 150 MG CAPS capsule Take 1 capsule (150 mg total) by mouth daily. For one month then may stop.  30 each  0  . potassium chloride (K-DUR,KLOR-CON) 10 MEQ tablet Take 1 tablet (10 mEq total) by mouth daily.  30 tablet  0  . simvastatin (ZOCOR) 20 MG tablet Take 20 mg by mouth at bedtime.        . traMADol (ULTRAM) 50 MG tablet Take 1-2 tablets (50-100 mg total) by mouth every 4 (four) hours as needed for pain. Maximum dose= 8 tablets per day  45 tablet  0    Allergies: Clopidogrel bisulfate; Codeine; and Morphine History   Social History  . Marital Status: Married    Spouse Name: N/A    Number of Children: N/A  . Years of Education: N/A   Occupational History  . Not on file.   Social History Main Topics  . Smoking status: Former Smoker -- 1.0 packs/day for 20 years    Types: Cigarettes    Quit date: 02/27/2007  . Smokeless tobacco: Never Used  . Alcohol Use: No  . Drug Use: No  . Sexually Active: Not on file   Other Topics Concern  . Not on file   Social History Narrative  . No narrative on file   No family history on file. Past Surgical History  Procedure Date  . Right mastectomy   . Mastectomy 1988    right  . Cholecystectomy 1969  . Abdominal hysterectomy 1989  . Tonsillectomy and adenoidectomy 1979  . Appendectomy 1969  . Tubal ligation 1972  . Coronary angioplasty with stent placement 4 Times    "had 2 stents each time; last time 2010"  . Cardiac catheterization 01/05/11    "no stents"  . Coronary artery bypass graft 01/11/2011    Procedure: CORONARY ARTERY BYPASS GRAFTING (CABG);  Surgeon: Kathlee Nations Suann Larry, MD;  Location: [facility] OR;  Service: Open Heart Surgery;  Laterality: N/A;  cabg x four, using left internal mammary artery and right leg greater saphenous vein harvestede endoscopically    Review of Systems: Systems  reviewed and as per HPI, otherwise negative.  Physical Exam: Height 5\' 5"  (1.651 m), weight 89.6 kg (197 lb 8.5 oz), last menstrual period 01/05/1996. Ht 5\' 5"  (1.651 m)  Wt 89.6 kg (197 lb 8.5 oz)  BMI 32.87 kg/m2  LMP 01/05/1996  General Appearance:    Alert, pale and weak   Head:    Normocephalic, without obvious abnormality, atraumatic  Eyes:  PERRL, conjunctiva/corneas clear pale, EOM's intact, fundi    benign, both eyes     Nose:   Nares normal, septum midline, mucosa normal, no drainage    or sinus tenderness  Throat:   Lips, mucosa, and tongue normal; teeth and gums normal  Neck:   Supple, symmetrical, trachea midline, no adenopathy;    thyroid:  no enlargement/tenderness/nodules; no carotid   Bruit.thick, difficult to assess JVD   Back:     Symmetric, no curvature, ROM normal, no CVA tenderness  Lungs:    diminished at the bases   Chest Wall:    median sternotomy incision without drainage or signs of infection.    Heart:    slow, regular, S1 and S2 normal, no murmur, rub   or gallop  Breast Exam:    No tenderness, masses, or nipple abnormality  Abdomen:     Soft, non-tender, bowel sounds active all four quadrants,    no masses, no organomegaly, incisions without drainage   Genitalia:   deferred   Rectal:   deferred   Extremities:   Extremities normal, atraumatic, no cyanosis or edema  Pulses:   2+ and symmetric all extremities  Skin:   2+ pitting edema   Lymph nodes:   Cervical, supraclavicular, and axillary nodes normal  Neurologic:   CNII-XII intact, normal strength, sensation and reflexes    throughout     Psychiatric: Normal affect cooperative Lab results: Basic Metabolic Panel: BMET    Component Value Date/Time   NA 130* 01/29/2011 1643   K 7.0* 01/29/2011 1643   CL 93* 01/29/2011 1643   CO2 24 01/29/2011 1643   GLUCOSE 76 01/29/2011 1643   BUN 105* 01/29/2011 1643   CREATININE 3.84* 01/29/2011 1643   CREATININE 0.84 01/04/2011 1009   CALCIUM 9.5 01/29/2011  1643   GFRNONAA 12* 01/29/2011 1643   GFRAA 14* 01/29/2011 1643   Phosphorus 8.8. Magnesium 2.5. Liver function tests essentially unremarkable  CBC:  Basename 01/29/11 1643  WBC 10.6*  NEUTROABS 7.5  HGB 9.5*  HCT 29.1*  MCV 94.5  PLT 333   Cardiac Enzymes:  Basename 01/29/11 1643  CKTOTAL PENDING  CKMB 2.9  CKMBINDEX --  TROPONINI <0.30   BNP:  Basename 01/29/11 1644  POCBNP 12246.0*   D-Dimer:  Basename 01/29/11 1643  DDIMER 6.13*   Urinalysis: Pending  Imaging results:  Dg Chest Port 1 View  01/29/2011  *RADIOLOGY REPORT*  Clinical Data: Bradycardia.  PORTABLE CHEST - 1 VIEW  Comparison: 01/24/2011.  Findings: Stable enlarged cardiac silhouette and post CABG changes. Increased opacity at both lung bases with persistent bilateral pleural fluid.  IMPRESSION:  1.  Increased bibasilar atelectasis/pneumonia. 2.  No significant change in bilateral pleural fluid.  Original Report Authenticated By: Darrol Angel, M.D.    Assessment & Plan: Principal Problem:  *Systolic CHF, acute on chronic Active Problems:  Bradycardia  Hyperkalemia  ARF (acute renal failure)  Essential hypertension, benign  Coronary atherosclerosis of native coronary artery  DM type 2 with diabetic peripheral neuropathy  HYPERLIPIDEMIA-MIXED  Hyperphosphatemia  Bifascicular block  Most pressing currently is patient's lack of IV access. A consult to Dr. Franky Macho to put in a central line. She may also most need a temporary dialysis catheter, and I've consulted Dr. Kristian Covey. She will get  Kayexalate. Once IV access is established, D50, insulin, calcium chloride, sodium bicarbonate. Stop ACE inhibitor. Stop potassium. Stop amiodarone and metoprolol. Dr. Diona Browner recommended a dopamine drip. IV Lasix. Stop metformin. Decrease Lantus  and lispro, give sliding scale insulin. Daily weights, Foley catheter, strict ins and outs. D-dimer was ordered by cardiology, but doubt PE. Cycle cardiac enzymes and  check TSH.   Critical care time 90 minutes.  Cadan Maggart L 01/29/2011, 5:33 PM

## 2011-01-29 NOTE — Consult Note (Signed)
Reason for Consult: Acute kidney injury and hyperkalemia Referring Physician: Dr. Gonzella Lex [name] is an 60 y.o. female.  HPI: Patient with  history of  diabetes coronary artery disease status post her CABG about 10 days ago presently admitted because of  weakness, bradycardia, and also difficulty breathing. According to the patient she was sent from the hospital after she is CABG about 3 days ago. At that  time her renal function seems to be normal.  Even though  patient has long-standing history of diabetes she denies any proteinuria, leg swelling, or diabetic retinopathy. Patient denies any previous history of kidney stone or renal failure. Presently  patient was found to have elevated BUN and  creatinine and also hyperkalemia.  Past Medical History  Diagnosis Date  . Coronary atherosclerosis of native coronary artery     BMS circ 2001, DES LAD 2005, DES LAD and RCA 2008  . Drug allergy     Allergy to Plavix (hives) - took Ticlid in past  . Essential hypertension, benign   . Mixed hyperlipidemia   . Type 2 diabetes mellitus   . Breast cancer     Chemo and mastectomy  . Angina   . Myocardial infarction ~ 2002  . Diabetes mellitus   . Shortness of breath 01/05/11     "w/exertion"  . Arthritis     rheumatoid  . Depression   . Peripheral neuropathy     "from diabetes"  . Peptic ulcer disease ~ 1975    Past Surgical History  Procedure Date  . Right mastectomy   . Mastectomy 1988    right  . Cholecystectomy 1969  . Abdominal hysterectomy 1989  . Tonsillectomy and adenoidectomy 1979  . Appendectomy 1969  . Tubal ligation 1972  . Coronary angioplasty with stent placement 4 Times    "had 2 stents each time; last time 2010"  . Cardiac catheterization 01/05/11    "no stents"  . Coronary artery bypass graft 01/11/2011    Procedure: CORONARY ARTERY BYPASS GRAFTING (CABG);  Surgeon: Kathlee Nations Suann Larry, MD;  Location: [facility] OR;  Service: Open Heart Surgery;  Laterality: N/A;   cabg x four, using left internal mammary artery and right leg greater saphenous vein harvestede endoscopically    No family history on file.  Social History:  reports that she quit smoking about 3 years ago. Her smoking use included Cigarettes. She has a 20 pack-year smoking history. She has never used smokeless tobacco. She reports that she does not drink alcohol or use illicit drugs.  Allergies:  Allergies  Allergen Reactions  . Clopidogrel Bisulfate Hives  . Codeine Nausea And Vomiting  . Morphine Nausea And Vomiting    Medications: I have reviewed the patient's current medications.  Results for orders placed during the hospital encounter of 01/29/11 (from the past 48 hour(s))  CBC     Status: Abnormal   Collection Time   01/29/11  4:43 PM      Component Value Range Comment   WBC 10.6 (*) 4.0 - 10.5 (K/uL)    RBC 3.08 (*) 3.87 - 5.11 (MIL/uL)    Hemoglobin 9.5 (*) 12.0 - 15.0 (g/dL)    HCT 16.1 (*) 09.6 - 46.0 (%)    MCV 94.5  78.0 - 100.0 (fL)    MCH 30.8  26.0 - 34.0 (pg)    MCHC 32.6  30.0 - 36.0 (g/dL)    RDW 04.5  40.9 - 81.1 (%)    Platelets 333  150 - 400 (K/uL)   DIFFERENTIAL     Status: Normal   Collection Time   01/29/11  4:43 PM      Component Value Range Comment   Neutrophils Relative 71  43 - 77 (%)    Neutro Abs 7.5  1.7 - 7.7 (K/uL)    Lymphocytes Relative 19  12 - 46 (%)    Lymphs Abs 2.0  0.7 - 4.0 (K/uL)    Monocytes Relative 8  3 - 12 (%)    Monocytes Absolute 0.9  0.1 - 1.0 (K/uL)    Eosinophils Relative 2  0 - 5 (%)    Eosinophils Absolute 0.2  0.0 - 0.7 (K/uL)    Basophils Relative 0  0 - 1 (%)    Basophils Absolute 0.0  0.0 - 0.1 (K/uL)   COMPREHENSIVE METABOLIC PANEL     Status: Abnormal   Collection Time   01/29/11  4:43 PM      Component Value Range Comment   Sodium 130 (*) 135 - 145 (mEq/L)    Potassium 7.0 (*) 3.5 - 5.1 (mEq/L)    Chloride 93 (*) 96 - 112 (mEq/L)    CO2 24  19 - 32 (mEq/L)    Glucose, Bld 76  70 - 99 (mg/dL)    BUN 119  (*) 6 - 23 (mg/dL)    Creatinine, Ser 1.47 (*) 0.50 - 1.10 (mg/dL)    Calcium 9.5  8.4 - 10.5 (mg/dL)    Total Protein 7.6  6.0 - 8.3 (g/dL)    Albumin 3.4 (*) 3.5 - 5.2 (g/dL)    AST 33  0 - 37 (U/L)    ALT 31  0 - 35 (U/L)    Alkaline Phosphatase 80  39 - 117 (U/L)    Total Bilirubin 0.5  0.3 - 1.2 (mg/dL)    GFR calc non Af Amer 12 (*) >90 (mL/min)    GFR calc Af Amer 14 (*) >90 (mL/min)   D-DIMER, QUANTITATIVE     Status: Abnormal   Collection Time   01/29/11  4:43 PM      Component Value Range Comment   D-Dimer, Quant 6.13 (*) 0.00 - 0.48 (ug/mL-FEU)   CARDIAC PANEL(CRET KIN+CKTOT+MB+TROPI)     Status: Normal   Collection Time   01/29/11  4:43 PM      Component Value Range Comment   Total CK 47  7 - 177 (U/L)    CK, MB 2.9  0.3 - 4.0 (ng/mL)    Troponin I <0.30  <0.30 (ng/mL)    Relative Index RELATIVE INDEX IS INVALID  0.0 - 2.5    MAGNESIUM     Status: Normal   Collection Time   01/29/11  4:43 PM      Component Value Range Comment   Magnesium 2.5  1.5 - 2.5 (mg/dL)   PHOSPHORUS     Status: Abnormal   Collection Time   01/29/11  4:43 PM      Component Value Range Comment   Phosphorus 8.8 (*) 2.3 - 4.6 (mg/dL)   PRO B NATRIURETIC PEPTIDE     Status: Abnormal   Collection Time   01/29/11  4:44 PM      Component Value Range Comment   BNP, POC 12246.0 (*) 0 - 125 (pg/mL)     Dg Chest Port 1 View  01/29/2011  *RADIOLOGY REPORT*  Clinical Data: Bradycardia.  PORTABLE CHEST - 1 VIEW  Comparison: 01/24/2011.  Findings: Stable enlarged cardiac silhouette  and post CABG changes. Increased opacity at both lung bases with persistent bilateral pleural fluid.  IMPRESSION:  1.  Increased bibasilar atelectasis/pneumonia. 2.  No significant change in bilateral pleural fluid.  Original Report Authenticated By: Darrol Angel, M.D.    Review of Systems  Respiratory: Positive for shortness of breath.   Cardiovascular: Positive for orthopnea and leg swelling. Negative for chest pain.    Gastrointestinal: Positive for nausea. Negative for vomiting and abdominal pain.  Neurological: Positive for weakness.   Height 5\' 5"  (1.651 m), weight 89.6 kg (197 lb 8.5 oz), last menstrual period 01/05/1996. Physical Exam  Constitutional: She is oriented to person, place, and time.  Eyes: No scleral icterus.  Cardiovascular: Normal heart sounds and intact distal pulses.   No murmur heard. Respiratory: No respiratory distress. She has no wheezes.  GI: There is no tenderness. There is no rebound.  Musculoskeletal: She exhibits edema.  Neurological: She is alert and oriented to person, place, and time.    Assessment/Plan: Problem #1 acute kidney injury at this moment the etiology not clear. Patient has recent cardiac cath status post CABG but her renal function seems to be within normal range. Presently her BUN creatinine was found to be high. This could be a combiation of ACE inhibitor and possibly artero embolic disease versus ATN. Since patient just came as this moment or sure whether she is oliguric or note. According to the patient recently she's not making that much amount of urine. She has poor appetite otherwise she doesn't have any vomiting. She denies also any diarrhea. Problem #2 hyperkalemia possibly multifactorial including ACE inhibitor, beta blocker and also renal failure and. Since the patient has history of diabetes type neuropathy also need to be entertained as contributing factor. Problem #3 history of bradycardia this seems to be present post CABG. She was also monitored on a beta blocker. Problem #4 history of diabetes Problem #5 coronary artery disease status post CABG she'll have any chest pain at this moment. Problem #6 bifascicular block this also seems to be an early finding. Problem #7 history of fatty liver Problem #8 history of a trial fibrillation.  Recommendation we'll give her calcium gluconate 1 ampule We'll give her D50 and 10 units of insulin We'll start  her on normal saline at the 75 cc per hour We'll use h Lasix 100 mg IV twice a day Her last metabolic factor and follow her input and output. Cardiovascular excellent give her an additional dose. If her renal function doesn't improve have discussed with her probably will consider dialyzing  her.  Talissa Apple S 01/29/2011, 6:59 PM

## 2011-01-30 ENCOUNTER — Encounter: Payer: Self-pay | Admitting: Cardiology

## 2011-01-30 DIAGNOSIS — N179 Acute kidney failure, unspecified: Secondary | ICD-10-CM

## 2011-01-30 LAB — IRON AND TIBC
Iron: 33 ug/dL — ABNORMAL LOW (ref 42–135)
TIBC: 381 ug/dL (ref 250–470)

## 2011-01-30 LAB — CARDIAC PANEL(CRET KIN+CKTOT+MB+TROPI)
CK, MB: 2.9 ng/mL (ref 0.3–4.0)
CK, MB: 2.7 ng/mL (ref 0.3–4.0)
Relative Index: 2.8 — ABNORMAL HIGH (ref 0.0–2.5)
Relative Index: INVALID (ref 0.0–2.5)
Total CK: 102 U/L (ref 7–177)
Total CK: 86 U/L (ref 7–177)

## 2011-01-30 LAB — GLUCOSE, CAPILLARY
Glucose-Capillary: 132 mg/dL — ABNORMAL HIGH (ref 70–99)
Glucose-Capillary: 174 mg/dL — ABNORMAL HIGH (ref 70–99)
Glucose-Capillary: 169 mg/dL — ABNORMAL HIGH (ref 70–99)
Glucose-Capillary: 234 mg/dL — ABNORMAL HIGH (ref 70–99)

## 2011-01-30 LAB — URINE MICROSCOPIC-ADD ON

## 2011-01-30 LAB — VITAMIN B12: Vitamin B-12: 1274 pg/mL — ABNORMAL HIGH (ref 211–911)

## 2011-01-30 LAB — CBC
MCH: 31.1 pg (ref 26.0–34.0)
MCHC: 32.8 g/dL (ref 30.0–36.0)
Platelets: 255 10*3/uL (ref 150–400)
RDW: 14.6 % (ref 11.5–15.5)

## 2011-01-30 LAB — URINALYSIS, ROUTINE W REFLEX MICROSCOPIC
Glucose, UA: NEGATIVE mg/dL
Nitrite: NEGATIVE
Specific Gravity, Urine: >1.03 — ABNORMAL HIGH (ref 1.005–1.030)
pH: 5 (ref 5.0–8.0)

## 2011-01-30 LAB — FOLATE: Folate: >20 ng/mL

## 2011-01-30 LAB — FERRITIN: Ferritin: 258 ng/mL (ref 10–291)

## 2011-01-30 LAB — RETICULOCYTES: RBC.: 2.6 MIL/uL — ABNORMAL LOW (ref 3.87–5.11)

## 2011-01-30 LAB — BASIC METABOLIC PANEL
Calcium: 9.1 mg/dL (ref 8.4–10.5)
Creatinine, Ser: 3.97 mg/dL — ABNORMAL HIGH (ref 0.50–1.10)
GFR calc Af Amer: 13 mL/min — ABNORMAL LOW (ref >90)

## 2011-01-30 LAB — TSH: TSH: 0.98 u[IU]/mL (ref 0.350–4.500)

## 2011-01-30 MED ORDER — ENOXAPARIN SODIUM 30 MG/0.3ML ~~LOC~~ SOLN
30.0000 mg | Freq: Every day | SUBCUTANEOUS | Status: DC
  Administered 2011-01-30 – 2011-02-01 (×3): 30 mg via SUBCUTANEOUS
  Filled 2011-01-30 (×3): qty 0.3

## 2011-01-30 MED ORDER — FUROSEMIDE 10 MG/ML IJ SOLN
160.0000 mg | Freq: Two times a day (BID) | INTRAVENOUS | Status: DC
  Administered 2011-01-30 – 2011-01-31 (×4): 160 mg via INTRAVENOUS
  Filled 2011-01-30 (×5): qty 16

## 2011-01-30 MED ORDER — SODIUM CHLORIDE 0.9 % IJ SOLN
INTRAMUSCULAR | Status: AC
  Filled 2011-01-30: qty 3

## 2011-01-30 NOTE — Progress Notes (Signed)
SUBJECTIVE:I am feeling much better.  Principal Problem:  *Systolic CHF, acute on chronic Active Problems:  Coronary atherosclerosis of native coronary artery  Essential hypertension, benign  HYPERLIPIDEMIA-MIXED  DM type 2 with diabetic peripheral neuropathy  Bradycardia  Hyperkalemia  Hyperphosphatemia  ARF (acute renal failure)  Bifascicular block  Anemia   LABS: Basic Metabolic Panel:  Basename 01/30/11 0445 01/29/11 2039 01/29/11 1643  NA 133* 129* --  K 5.7* 5.7* --  CL 96 92* --  CO2 26 25 --  GLUCOSE 84 273* --  BUN 109* 107* --  CREATININE 3.97* 3.93* --  CALCIUM 9.1 9.2 --  MG -- -- 2.5  PHOS -- -- 8.8*   Liver Function Tests:  [facility] 01/29/11 1643  AST 33  ALT 31  ALKPHOS 80  BILITOT 0.5  PROT 7.6  ALBUMIN 3.4*   No results found for this basename: LIPASE:2,AMYLASE:2 in the last 72 hours CBC:  Basename 01/30/11 0445 01/29/11 1643  WBC 6.5 10.6*  NEUTROABS -- 7.5  HGB 8.4* 9.5*  HCT 25.6* 29.1*  MCV 94.8 94.5  PLT 255 333   Cardiac Enzymes:  Basename 01/30/11 0442 01/30/11 0027 01/29/11 1643  CKTOTAL 86 102 47  CKMB 2.7 2.9 2.9  CKMBINDEX -- -- --  TROPONINI <0.30 <0.30 <0.30   BNP:  Basename 01/29/11 1644  POCBNP 12246.0*   D-Dimer:  Basename 01/29/11 1643  DDIMER 6.13*   Hemoglobin A1C:  Basename 01/29/11 1643  HGBA1C 6.8*   Fasting Lipid Panel: No results found for this basename: CHOL,HDL,LDLCALC,TRIG,CHOLHDL,LDLDIRECT in the last 72 hours Thyroid Function Tests:  Basename 01/29/11 1643  TSH 0.980  T4TOTAL --  T3FREE --  THYROIDAB --    RADIOLOGY: Dg Chest Port 1 View  01/29/2011  *RADIOLOGY REPORT*  Clinical Data: Bradycardia.  PORTABLE CHEST - 1 VIEW  Comparison: 01/24/2011.  Findings: Stable enlarged cardiac silhouette and post CABG changes. Increased opacity at both lung bases with persistent bilateral pleural fluid.  IMPRESSION:  1.  Increased bibasilar atelectasis/pneumonia. 2.  No significant change in  bilateral pleural fluid.  Original Report Authenticated By: Darrol Angel, M.D.   Dg Chest Port 1v Same Day  01/29/2011  *RADIOLOGY REPORT*  Clinical Data: Central line placement  PORTABLE CHEST - 1 VIEW SAME DAY  Comparison: Portable exam 1900 hours compared to 1643 hours  Findings: New left subclavian central venous catheter, tip projecting over SVC at the level of the azygos confluence. Enlargement of cardiac silhouette post CABG. Pulmonary vascular congestion. Bibasilar infiltrates and question pleural effusions little changed. Atelectasis left mid lung. No pneumothorax following central line placement.  IMPRESSION: No pneumothorax following central line placement. Persistent bibasilar consolidation and question pleural effusions.  Original Report Authenticated By: Lollie Marrow, M.D.     PHYSICAL EXAM BP 103/48  Pulse 44  Temp(Src) 97.5 F (36.4 C) (Oral)  Resp 10  Ht 5\' 5"  (1.651 m)  Wt 197 lb 8.5 oz (89.6 kg)  BMI 32.87 kg/m2  SpO2 95%  LMP 01/05/1996 General: Well developed, well nourished, in no acute distress Head: Eyes PERRLA, No xanthomas.   Normal cephalic and atramatic  Lungs:Diminished bibasilar with crackles noted throughout lungs Heart: HRRR S1 S2, bradycardic .  Pulses are 2+ & equal.            No carotid bruit. + JVD at 12 cm.  No abdominal bruits. No femoral bruits. Abdomen: Bowel sounds are positive, abdomen soft and non-tender without masses or  Hernia's noted. Msk:  Back normal, normal gait. Normal strength and tone for age. Extremities: No clubbing, cyanosis 2+ edema to the thighs bilateraly. Neuro: Alert and oriented X 3. Psych:  Good affect, responds appropriately  TELEMETRY: Reviewed telemetry pt in Bradycardia rates in the 40's.  ASSESSMENT AND PLAN:  1. Acute Systolic Heart failure: This is multifactorial with acute renal failure, bradycardia, anemia, hyperkalemia. She is 3 weeks post CABG (01/11/2011). Potassium is improved with  kayexulate from 7.0 to 5.7. She is positive on I/O as she is on IV fluids and diuretics per nephrology. Continues on lasix 160 mg Q 6 hrs. BNP on admission 12246.0.  Breathing better, but lungs sounds are diminished.  Dopamine for renal perfusion continues at 5 mcgs.   2. Bradycardia: She is now off of metoprolol and amiodarone. HR is in the 40's now with dopamine assist and better BP control, as she was hypotensive on admission. She did not tolerate BB at [facility] hospital post-operatively requiring a pacemaker temporarily. She is stable at this time, without need to place pacemaker at present.   3. CAD s/p CABG:  Cardiac enzymes are negative X 2 which excludes ischemic event causing CHF. She continues to have soreness at sternotomy site.   4. Acute renal failure: Cannot exclude thrombotic event to kidney's as etiology.  Cardiac cath was completed on 01/05/2011, with no post contrast dye nephropathy on review of labs from that date to discharge with stable Creatinine with some elevation to 1.4  4 days post-operatively.  Dr. Kristian Covey is managing this. He is considering dialysis if medical treatment is unsuccessful.   5. Anemia: She was found to be anemic during recent hospitalization at [facility] with transfusions necessary at that time. Do not see anemia profile completed.  Most recent was done on 2009 and found to be WNL.  She was diagnosed with blood loss anemia in the setting of CABG. Will repeat this lab. Will type and cross for two units today.

## 2011-01-30 NOTE — Progress Notes (Signed)
Pt has a cabg at [facility] and was released on 01/25/11. She stated that she has not felt good since she was sent home. She was adimitted with a HR of 38 her HR now is 44. And she looks much better on 01/30/11. RT left bbs Tx. Prn due to the pt not being ambulatory without assisatance.

## 2011-01-30 NOTE — Consult Note (Signed)
  Cardiology Attending Patient interviewed and examined. Discussed with Joni Reining, NP.  Patient presented with acute renal failure, perhaps related to ACE inhibitor. Bradycardia on beta blockers, which should be regarded as contraindicated for her, may also have been a significant factor.  Current treatment with high-dose furosemide, intravenous fluids and dopamine should be efficacious in the setting, but for now fluid balance is net positive. If this continues to be the case, IV fluid infusion rate will need to be decreased and/or dose of furosemide increased. Maintain sinus rhythm. Continued presence of amiodarone in her system is contributing to bradycardia. This medication also cannot be utilized in the future unless a permanent pacemaker is first placed. She remains fluid overloaded and anemic, which was the case at the time of her discharge from [facility].  As long as hemoglobin remains greater than 7, symptoms are tolerable and she is improving, transfusion may not be necessary.    [place]ainfield Bing, MD 01/30/2011, 5:22 PM

## 2011-01-30 NOTE — Progress Notes (Signed)
Overnight, potassium improved. Remained bradycardic.  Subjective: "I feel 100% better. " Orthopnea gone. Shortness of breath improved. No bleeding.  Objective: Vital signs in last 24 hours: Filed Vitals:   01/30/11 0600 01/30/11 0615 01/30/11 0630 01/30/11 0645  BP: 105/45 107/47 107/43 103/48  Pulse: 45 44 44 44  Temp:      TempSrc:      Resp: 11 10 9 10   Height:      Weight:      SpO2: 96% 96% 96% 95%   Weight change:   Intake/Output Summary (Last 24 hours) at 01/30/11 0825 Last data filed at 01/30/11 0600  Gross per 24 hour  Intake 886.44 ml  Output    225 ml  Net 661.44 ml   Physical Exam: General: Eating breakfast. Able to lie supine momentarily Lungs: Decreased at the bases with rales. No wheezes or rhonchi. Cardiovascular slow regular Abdomen soft nontender Extremities 2+ pitting edema  Lab Results: Basic Metabolic Panel:  Lab 01/30/11 1610 01/29/11 2039 01/29/11 1643  NA 133* 129* --  K 5.7* 5.7* --  CL 96 92* --  CO2 26 25 --  GLUCOSE 84 273* --  BUN 109* 107* --  CREATININE 3.97* 3.93* --  CALCIUM 9.1 9.2 --  MG -- -- 2.5  PHOS -- -- 8.8*   Liver Function Tests:  Lab 01/29/11 1643  AST 33  ALT 31  ALKPHOS 80  BILITOT 0.5  PROT 7.6  ALBUMIN 3.4*   No results found for this basename: LIPASE:2,AMYLASE:2 in the last 168 hours No results found for this basename: AMMONIA:2 in the last 168 hours CBC:  Lab 01/30/11 0445 01/29/11 1643  WBC 6.5 10.6*  NEUTROABS -- 7.5  HGB 8.4* 9.5*  HCT 25.6* 29.1*  MCV 94.8 94.5  PLT 255 333   Cardiac Enzymes:  Lab 01/30/11 0442 01/30/11 0027 01/29/11 1643  CKTOTAL 86 102 47  CKMB 2.7 2.9 2.9  CKMBINDEX -- -- --  TROPONINI <0.30 <0.30 <0.30   BNP:  Lab 01/29/11 1644 01/25/11 0621  POCBNP 12246.0* 3283.0*   D-Dimer:  Lab 01/29/11 1643  DDIMER 6.13*   CBG:  Lab 01/30/11 0730 01/30/11 0457 01/29/11 2113 01/25/11 1153 01/25/11 0552 01/24/11 2109  GLUCAP 132* 83 131* 166* 141* 161*    Hemoglobin A1C:  Lab 01/29/11 1643  HGBA1C 6.8*   Fasting Lipid Panel: No results found for this basename: CHOL,HDL,LDLCALC,TRIG,CHOLHDL,LDLDIRECT in the last 960 hours Thyroid Function Tests:  Lab 01/29/11 1643  TSH 0.980  T4TOTAL --  FREET4 --  T3FREE --  THYROIDAB --   Coagulation: No results found for this basename: LABPROT:4,INR:4 in the last 168 hours Anemia Panel: No results found for this basename: VITAMINB12,FOLATE,FERRITIN,TIBC,IRON,RETICCTPCT in the last 168 hours Urine Drug Screen: Drugs of Abuse  No results found for this basename: labopia, cocainscrnur, labbenz, amphetmu, thcu, labbarb    Alcohol Level: No results found for this basename: ETH:2 in the last 168 hours Urinalysis: Results for ELLYSA, PARRACK (MRN [id number]) as of 01/30/2011 08:29  Ref. Range 01/29/2011 21:50  Color, Urine Latest Range: YELLOW  AMBER (A)  APPearance Latest Range: CLEAR  CLOUDY (A)  Specific Gravity, Urine Latest Range: 1.005-1.030  >1.030 (H)  pH Latest Range: 5.0-8.0  5.0  Glucose, UA Latest Range: NEGATIVE mg/dL NEGATIVE  Bilirubin Urine Latest Range: NEGATIVE  SMALL (A)  Ketones, ur Latest Range: NEGATIVE mg/dL NEGATIVE  Protein Latest Range: NEGATIVE mg/dL 147 (A)  Urobilinogen, UA Latest Range: 0.0-1.0 mg/dL 0.2  Nitrite Latest  Range: NEGATIVE  NEGATIVE  Leukocytes, UA Latest Range: NEGATIVE  TRACE (A)  WBC, UA Latest Range: <3 WBC/hpf 7-10  RBC / HPF Latest Range: <3 RBC/hpf 21-50  Squamous Epithelial / LPF Latest Range: RARE  MANY (A)  Bacteria, UA Latest Range: RARE  MANY (A)    Micro Results: Recent Results (from the past 240 hour(s))  MRSA PCR SCREENING     Status: Normal   Collection Time   01/29/11  5:55 PM      Component Value Range Status Comment   MRSA by PCR NEGATIVE  NEGATIVE  Final    Studies/Results: Dg Chest Port 1 View  01/29/2011  *RADIOLOGY REPORT*  Clinical Data: Bradycardia.  PORTABLE CHEST - 1 VIEW  Comparison: 01/24/2011.  Findings:  Stable enlarged cardiac silhouette and post CABG changes. Increased opacity at both lung bases with persistent bilateral pleural fluid.  IMPRESSION:  1.  Increased bibasilar atelectasis/pneumonia. 2.  No significant change in bilateral pleural fluid.  Original Report Authenticated By: Darrol Angel, M.D.   Dg Chest Port 1v Same Day  01/29/2011  *RADIOLOGY REPORT*  Clinical Data: Central line placement  PORTABLE CHEST - 1 VIEW SAME DAY  Comparison: Portable exam 1900 hours compared to 1643 hours  Findings: New left subclavian central venous catheter, tip projecting over SVC at the level of the azygos confluence. Enlargement of cardiac silhouette post CABG. Pulmonary vascular congestion. Bibasilar infiltrates and question pleural effusions little changed. Atelectasis left mid lung. No pneumothorax following central line placement.  IMPRESSION: No pneumothorax following central line placement. Persistent bibasilar consolidation and question pleural effusions.  Original Report Authenticated By: Lollie Marrow, M.D.   Scheduled Meds:   . aspirin  325 mg Oral Daily  . calcium gluconate  1 g Intravenous Once  . dextrose  25 g Intravenous Once  . docusate sodium  100 mg Oral BID  . enoxaparin  40 mg Subcutaneous Q24H  . folic acid  1 mg Oral Daily  . furosemide  100 mg Intravenous Once  . furosemide  160 mg Intravenous BID  . gabapentin  300 mg Oral TID  . insulin aspart  0-9 Units Subcutaneous TID WC  . insulin aspart  10 Units Intravenous Once  . PARoxetine  20 mg Oral Q0700  . polysaccharide iron  150 mg Oral Daily  . simvastatin  20 mg Oral QHS  . sodium bicarbonate  50 mEq Intravenous Once  . sodium chloride  10 mL Intracatheter Q12H  . sodium chloride      . sodium polystyrene  45 g Oral Once  . DISCONTD: amiodarone  200 mg Oral Daily  . DISCONTD: calcium chloride  1 g Intravenous Once  . DISCONTD: calcium chloride  1 g Intravenous Once  . DISCONTD: calcium gluconate  1 g Intravenous Once   . DISCONTD: clopidogrel  75 mg Oral Q breakfast  . DISCONTD: enoxaparin (LOVENOX) injection  40 mg Subcutaneous Daily  . DISCONTD: furosemide  100 mg Intravenous BID  . DISCONTD: furosemide  40 mg Intravenous Q6H  . DISCONTD: furosemide  40 mg Intravenous Q8H  . DISCONTD: insulin aspart  10 Units Subcutaneous Once  . DISCONTD: insulin aspart  6 Units Subcutaneous TID WC  . DISCONTD: insulin aspart  6 Units Subcutaneous Once  . DISCONTD: insulin glargine  20 Units Subcutaneous BID  . DISCONTD: insulin glargine  35 Units Subcutaneous BID  . DISCONTD: insulin regular  6 Units Subcutaneous Once  . DISCONTD: insulin regular  10 Units  Intravenous Once  . DISCONTD: insulin regular  10 Units Intravenous Once  . DISCONTD: metFORMIN  1,000 mg Oral BID WC  . DISCONTD: potassium chloride  10 mEq Oral Daily  . DISCONTD: simvastatin  20 mg Oral q1800  . DISCONTD: sodium polystyrene  30 g Oral Q4H   Continuous Infusions:   . sodium chloride 75 mL/hr at 01/30/11 0600  . DOPamine 5 mcg/kg/min (01/30/11 0600)   PRN Meds:.acetaminophen, albuterol, guaiFENesin-dextromethorphan, HYDROmorphone, ondansetron (ZOFRAN) IV, ondansetron, senna-docusate, sodium chloride, traMADol, DISCONTD: alum & mag hydroxide-simeth  Assessment/Plan: Principal Problem:  *Systolic CHF, acute on chronic Active Problems:  Bradycardia  Hyperkalemia  ARF (acute renal failure)  Essential hypertension, benign  Coronary atherosclerosis of native coronary artery  DM type 2 with diabetic peripheral neuropathy  Anemia  HYPERLIPIDEMIA-MIXED  Hyperphosphatemia  Bifascicular block  Hyperkalemia is better. Still bradycardic at all. Will give another dose of Kayexalate. Continue dopamine at 5 mcg per kilogram per hour. Renal function is worse. Dr. Kristian Covey is following. Continue step down monitoring. Will check Hemoccults of the stool and and anemia panel.  Appreciate consultants.   LOS: 1 day   [name] 01/30/2011,  8:25 AM

## 2011-01-30 NOTE — Progress Notes (Signed)
Subjective: Interval History: has no complaint of nausea or vomiting this morning. Overall she says she is feeling better. And her breathing is also better she denies any shortness of breath or orthopnea. She was able to sleep last night. She is an episode of diarrhea yesterday possibly secondary to kyoxalate.  Objective: Vital signs in last 24 hours: Temp:  [97.4 F (36.3 C)-97.5 F (36.4 C)] 97.5 F (36.4 C) (12/04 0408) Pulse Rate:  [39-55] 44  (12/04 0645) Resp:  [9-20] 10  (12/04 0645) BP: (84-124)/(33-83) 103/48 mmHg (12/04 0645) SpO2:  [88 %-98 %] 95 % (12/04 0645) Weight:  [88.905 kg (196 lb)-89.6 kg (197 lb 8.5 oz)] 197 lb 8.5 oz (89.6 kg) (12/03 1638) Weight change:   Intake/Output from previous day: 12/03 0701 - 12/04 0700 In: 886.4 [I.V.:834.4; IV Piggyback:52] Out: 225 [Urine:225] Intake/Output this shift:    General appearance: alert, cooperative and no distress Resp: clear to auscultation bilaterally Cardio: regular rate and rhythm, S1, S2 normal, no murmur, click, rub or gallop GI: soft, non-tender; bowel sounds normal; no masses,  no organomegaly Extremities: edema 1-2+ edema bilaterally.  Lab Results:  Basename 01/30/11 0445 01/29/11 1643  WBC 6.5 10.6*  HGB 8.4* 9.5*  HCT 25.6* 29.1*  PLT 255 333   BMET:  Basename 01/30/11 0445 01/29/11 2039  NA 133* 129*  K 5.7* 5.7*  CL 96 92*  CO2 26 25  GLUCOSE 84 273*  BUN 109* 107*  CREATININE 3.97* 3.93*  CALCIUM 9.1 9.2   No results found for this basename: PTH:2 in the last 72 hours Iron Studies: No results found for this basename: IRON,TIBC,TRANSFERRIN,FERRITIN in the last 72 hours  Studies/Results: Dg Chest Port 1 View  01/29/2011  *RADIOLOGY REPORT*  Clinical Data: Bradycardia.  PORTABLE CHEST - 1 VIEW  Comparison: 01/24/2011.  Findings: Stable enlarged cardiac silhouette and post CABG changes. Increased opacity at both lung bases with persistent bilateral pleural fluid.  IMPRESSION:  1.  Increased  bibasilar atelectasis/pneumonia. 2.  No significant change in bilateral pleural fluid.  Original Report Authenticated By: Darrol Angel, M.D.   Dg Chest Port 1v Same Day  01/29/2011  *RADIOLOGY REPORT*  Clinical Data: Central line placement  PORTABLE CHEST - 1 VIEW SAME DAY  Comparison: Portable exam 1900 hours compared to 1643 hours  Findings: New left subclavian central venous catheter, tip projecting over SVC at the level of the azygos confluence. Enlargement of cardiac silhouette post CABG. Pulmonary vascular congestion. Bibasilar infiltrates and question pleural effusions little changed. Atelectasis left mid lung. No pneumothorax following central line placement.  IMPRESSION: No pneumothorax following central line placement. Persistent bibasilar consolidation and question pleural effusions.  Original Report Authenticated By: Lollie Marrow, M.D.    I have reviewed the patient's current medications.  Assessment/Plan: Problem #1 acute kidney injury presently her BUN and creatinine is 109 and 3.97 still increasing. Patient still seems to be oliguric. Problem #2 history of hyperkalemia status post a to kyoxalate  her potassium is 5.7 has improved. Problem #3 history of hyponatremia sodium was history seems to be better. Problem #4 history of hypertension blood pressure seems to be reasonabley controlled. Problem #4 history of diabetes Problem #5 history of  bradycardia her heart rate remained low. Problem #7 history of coronary artery disease status post CABG Problem #8 history of a trial fibrillation Problem #9 history of her CHF patient is still with some edema presently she is on Lasix. However patient at this moment denies any difficulty breathing.  Recommendation we'll continue his IV fluid We'll increase her Lasix to 120 mg IV twice a day We'll check her CBC and basic metabolic panel in the morning. We'll do also ultrasound of the kidneys.    LOS: 1 day   Bilaal Leib  S 01/30/2011,7:11 AM

## 2011-01-31 ENCOUNTER — Inpatient Hospital Stay (HOSPITAL_COMMUNITY): Payer: Managed Care, Other (non HMO)

## 2011-01-31 ENCOUNTER — Encounter: Payer: Self-pay | Admitting: *Deleted

## 2011-01-31 ENCOUNTER — Ambulatory Visit (HOSPITAL_COMMUNITY): Payer: Managed Care, Other (non HMO)

## 2011-01-31 LAB — BASIC METABOLIC PANEL
BUN: 108 mg/dL — ABNORMAL HIGH (ref 6–23)
Chloride: 97 mEq/L (ref 96–112)
GFR calc Af Amer: 18 mL/min — ABNORMAL LOW (ref >90)
GFR calc non Af Amer: 15 mL/min — ABNORMAL LOW (ref >90)
Potassium: 5 mEq/L (ref 3.5–5.1)
Sodium: 134 mEq/L — ABNORMAL LOW (ref 135–145)

## 2011-01-31 LAB — URINE MICROSCOPIC-ADD ON

## 2011-01-31 LAB — CBC
HCT: 24.5 % — ABNORMAL LOW (ref 36.0–46.0)
Hemoglobin: 7.9 g/dL — ABNORMAL LOW (ref 12.0–15.0)
MCHC: 32.2 g/dL (ref 30.0–36.0)
RDW: 14.4 % (ref 11.5–15.5)
WBC: 4.7 10*3/uL (ref 4.0–10.5)

## 2011-01-31 LAB — GLUCOSE, CAPILLARY
Glucose-Capillary: 175 mg/dL — ABNORMAL HIGH (ref 70–99)
Glucose-Capillary: 237 mg/dL — ABNORMAL HIGH (ref 70–99)
Glucose-Capillary: 139 mg/dL — ABNORMAL HIGH (ref 70–99)

## 2011-01-31 LAB — URINALYSIS, ROUTINE W REFLEX MICROSCOPIC
Glucose, UA: NEGATIVE mg/dL
Leukocytes, UA: NEGATIVE
pH: 5.5 (ref 5.0–8.0)

## 2011-01-31 LAB — OSMOLALITY: Osmolality: 324 mOsm/kg — ABNORMAL HIGH (ref 275–300)

## 2011-01-31 LAB — PREPARE RBC (CROSSMATCH)

## 2011-01-31 MED ORDER — CALCIUM ACETATE 667 MG PO CAPS
667.0000 mg | ORAL_CAPSULE | Freq: Three times a day (TID) | ORAL | Status: DC
  Administered 2011-01-31 – 2011-02-02 (×7): 667 mg via ORAL
  Filled 2011-01-31 (×8): qty 1

## 2011-01-31 MED ORDER — FUROSEMIDE 10 MG/ML IJ SOLN
INTRAMUSCULAR | Status: AC
  Filled 2011-01-31: qty 20

## 2011-01-31 MED ORDER — DEXTROSE 5 % IV SOLN
120.0000 mg | Freq: Four times a day (QID) | INTRAVENOUS | Status: DC
  Administered 2011-02-01 (×2): 120 mg via INTRAVENOUS
  Filled 2011-01-31 (×3): qty 12

## 2011-01-31 MED ORDER — GABAPENTIN 300 MG PO CAPS
300.0000 mg | ORAL_CAPSULE | Freq: Two times a day (BID) | ORAL | Status: DC
  Administered 2011-01-31 – 2011-02-06 (×14): 300 mg via ORAL
  Filled 2011-01-31 (×15): qty 1

## 2011-01-31 NOTE — Progress Notes (Signed)
[name]  MRN: [id number]  DOB/AGE: [id number] 61 y.o.  Primary Care Physician:FALGUI,VICENTE T, MD  Admit date: 01/29/2011  Chief Complaint: No chief complaint on file.   S-Pt presented on  01/29/2011 with No chief complaint on file. .    Pt today feels better      . aspirin  325 mg Oral Daily  . docusate sodium  100 mg Oral BID  . enoxaparin (LOVENOX) injection  30 mg Subcutaneous Daily  . furosemide  160 mg Intravenous BID  . gabapentin  300 mg Oral BID  . insulin aspart  0-9 Units Subcutaneous TID WC  . polysaccharide iron  150 mg Oral Daily  . simvastatin  20 mg Oral QHS  . sodium chloride  10 mL Intracatheter Q12H  . sodium chloride      . DISCONTD: folic acid  1 mg Oral Daily  . DISCONTD: gabapentin  300 mg Oral TID  . DISCONTD: PARoxetine  20 mg Oral Q0700         UVO:ZDGUY from the symptoms mentioned above,there are no other symptoms referable to all systems reviewed.  Physical Exam: Vital signs in last 24 hours: Temp:  [97.4 F (36.3 C)-98.8 F (37.1 C)] 98.8 F (37.1 C) (12/05 1545) Pulse Rate:  [50-57] 55  (12/05 1545) Resp:  [10-23] 15  (12/05 1545) BP: (82-128)/(31-79) 109/38 mmHg (12/05 1545) SpO2:  [95 %-99 %] 99 % (12/05 1500) Weight:  [208 lb 3.2 oz (94.439 kg)] 208 lb 3.2 oz (94.439 kg) (12/05 0500) Weight change: 9 lb 11.2 oz (4.4 kg) Last BM Date: 01/30/11  Intake/Output from previous day: 12/04 0701 - 12/05 0700 In: 3024.1 [P.O.:430; I.V.:2494.1; IV Piggyback:100] Out: 1750 [Urine:1750] Total I/O In: 672.2 [P.O.:240; I.V.:370.2; Blood:12; IV Piggyback:50] Out: 950 [Urine:950]   Filed Vitals:    01/30/11 0600  01/30/11 0615  01/30/11 0630  01/30/11 0645   BP:  105/45  107/47  107/43  103/48   Pulse:  45  44  44  44      Physical Exam: General- pt is awake,alert, oriented to time place and person Resp- No acute REsp distress, CTA B/L NO Rhonchi CVS- S1S2 regular ij rate and rhythm GIT- BS+, soft, NT, ND EXT- NO LE  Edema, Cyanosis   Lab Results: CBC  Basename 01/31/11 0348 01/30/11 0445  WBC 4.7 6.5  HGB 7.9* 8.4*  HCT 24.5* 25.6*  PLT 195 255    BMET  Basename 01/31/11 0348 01/30/11 0445  NA 134* 133*  K 5.0 5.7*  CL 97 96  CO2 28 26  GLUCOSE 166* 84  BUN 108* 109*  CREATININE 3.06* 3.97*  CALCIUM 8.9 9.1   Creatinine TRend  3.06<-- 3.97      MICRO Recent Results (from the past 240 hour(s))  MRSA PCR SCREENING     Status: Normal   Collection Time   01/29/11  5:55 PM      Component Value Range Status Comment   MRSA by PCR NEGATIVE  NEGATIVE  Final       Lab Results  Component Value Date   CALCIUM 8.9 01/31/2011   CAION 1.19 01/12/2011   PHOS 7.4* 01/31/2011      Impression: 1)Renal  AKI on CKD                AKI secondary to Pre Renal Vs ATN Vs Athero Embolic ds Vs Medications                Reason  for Prerenal - pt Hypotensive + ACE + Diuretics                Reason for ATN- Hypotensive + Bleeding Episodes Post op and above                Reason for Atheroembolic- Recent hx of CABG                AKI little better as Creat trending down.                 CKD stage 2/3 .                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   5)CAD- s/p CABG   6)FEN  Normokalemia was HYperkalemic Hyponatremia-secondary To CHF +  AKI    7)Acid base Co2 at goal   8) DM type 3- Stable being followed by primary team.  9) CVS    CAD s.p CABG                 CHF                 BradyCardia  Plan:   1) Agree with reducing IVF 2)will start Phosp Binders 3)will follow Bmet to see Creat  trend      [name] S 01/31/2011, 4:00 PM

## 2011-01-31 NOTE — Progress Notes (Signed)
Subjective: About the same as yesterday  Objective: Vital signs in last 24 hours: Filed Vitals:   01/31/11 0700 01/31/11 0759 01/31/11 0800 01/31/11 0900  BP: 105/37  90/37 105/43  Pulse: 51  51 53  Temp:  97.4 F (36.3 C)    TempSrc:  Oral    Resp: 13  13 21   Height:      Weight:      SpO2: 98%  97% 95%   Weight change: 4.4 kg (9 lb 11.2 oz)  Intake/Output Summary (Last 24 hours) at 01/31/11 0934 Last data filed at 01/31/11 0900  Gross per 24 hour  Intake 3049.01 ml  Output   1750 ml  Net 1299.01 ml   Physical Exam: General: Appears weak. Slightly winded with talking. Lungs: Decreased at the bases no rales today. Cardiovascular slow regular Abdomen soft nontender Extremities 2+ pitting edema  Lab Results: Basic Metabolic Panel:  Lab 01/31/11 1610 01/31/11 0348 01/30/11 0445 01/29/11 1643  NA -- 134* 133* --  K -- 5.0 5.7* --  CL -- 97 96 --  CO2 -- 28 26 --  GLUCOSE -- 166* 84 --  BUN -- 108* 109* --  CREATININE -- 3.06* 3.97* --  CALCIUM -- 8.9 9.1 --  MG -- -- -- 2.5  PHOS 7.4* -- -- 8.8*   Liver Function Tests:  Lab 01/29/11 1643  AST 33  ALT 31  ALKPHOS 80  BILITOT 0.5  PROT 7.6  ALBUMIN 3.4*   No results found for this basename: LIPASE:2,AMYLASE:2 in the last 168 hours No results found for this basename: AMMONIA:2 in the last 168 hours CBC:  Lab 01/31/11 0348 01/30/11 0445 01/29/11 1643  WBC 4.7 6.5 --  NEUTROABS -- -- 7.5  HGB 7.9* 8.4* --  HCT 24.5* 25.6* --  MCV 95.3 94.8 --  PLT 195 255 --   Cardiac Enzymes:  Lab 01/30/11 0442 01/30/11 0027 01/29/11 1643  CKTOTAL 86 102 47  CKMB 2.7 2.9 2.9  CKMBINDEX -- -- --  TROPONINI <0.30 <0.30 <0.30   BNP:  Lab 01/29/11 1644 01/25/11 0621  POCBNP 12246.0* 3283.0*   D-Dimer:  Lab 01/29/11 1643  DDIMER 6.13*   CBG:  Lab 01/31/11 0720 01/30/11 2044 01/30/11 1648 01/30/11 1108 01/30/11 0730 01/30/11 0457  GLUCAP 175* 234* 169* 174* 132* 83   Hemoglobin A1C:  Lab 01/29/11 1643   HGBA1C 6.8*   Fasting Lipid Panel: No results found for this basename: CHOL,HDL,LDLCALC,TRIG,CHOLHDL,LDLDIRECT in the last 960 hours Thyroid Function Tests:  Lab 01/29/11 1643  TSH 0.980  T4TOTAL --  FREET4 --  T3FREE --  THYROIDAB --   Coagulation: No results found for this basename: LABPROT:4,INR:4 in the last 168 hours Anemia Panel:  Lab 01/30/11 0951 01/30/11 0441  VITAMINB12 -- 1274*  FOLATE -- >20.0  FERRITIN -- 258  TIBC -- 381  IRON -- 33*  RETICCTPCT 4.0* --   Urine Drug Screen: Drugs of Abuse  No results found for this basename: labopia,  cocainscrnur,  labbenz,  amphetmu,  thcu,  labbarb    Alcohol Level: No results found for this basename: ETH:2 in the last 168 hours Urinalysis: Results for [name], [name] (MRN [id number]) as of 01/30/2011 08:29  Ref. Range 01/29/2011 21:50  Color, Urine Latest Range: YELLOW  AMBER (A)  APPearance Latest Range: CLEAR  CLOUDY (A)  Specific Gravity, Urine Latest Range: 1.005-1.030  >1.030 (H)  pH Latest Range: 5.0-8.0  5.0  Glucose, UA Latest Range: NEGATIVE mg/dL NEGATIVE  Bilirubin  Urine Latest Range: NEGATIVE  SMALL (A)  Ketones, ur Latest Range: NEGATIVE mg/dL NEGATIVE  Protein Latest Range: NEGATIVE mg/dL 161 (A)  Urobilinogen, UA Latest Range: 0.0-1.0 mg/dL 0.2  Nitrite Latest Range: NEGATIVE  NEGATIVE  Leukocytes, UA Latest Range: NEGATIVE  TRACE (A)  WBC, UA Latest Range: <3 WBC/hpf 7-10  RBC / HPF Latest Range: <3 RBC/hpf 21-50  Squamous Epithelial / LPF Latest Range: RARE  MANY (A)  Bacteria, UA Latest Range: RARE  MANY (A)    Micro Results: Recent Results (from the past 240 hour(s))  MRSA PCR SCREENING     Status: Normal   Collection Time   01/29/11  5:55 PM      Component Value Range Status Comment   MRSA by PCR NEGATIVE  NEGATIVE  Final    Studies/Results: Dg Chest 1 View  01/31/2011  *RADIOLOGY REPORT*  Clinical Data: CHF  CHEST - 1 VIEW  Comparison: 01/29/2011  Findings: Stable left subclavian  central venous catheter.  Moderate cardiomegaly.  Bibasilar atelectasis and bilateral pleural effusions stable.  No pneumothorax.  IMPRESSION: Stable bilateral effusions and bibasilar atelectasis.  Original Report Authenticated By: Donavan Burnet, M.D.   Dg Chest Port 1 View  01/29/2011  *RADIOLOGY REPORT*  Clinical Data: Bradycardia.  PORTABLE CHEST - 1 VIEW  Comparison: 01/24/2011.  Findings: Stable enlarged cardiac silhouette and post CABG changes. Increased opacity at both lung bases with persistent bilateral pleural fluid.  IMPRESSION:  1.  Increased bibasilar atelectasis/pneumonia. 2.  No significant change in bilateral pleural fluid.  Original Report Authenticated By: Darrol Angel, M.D.   Dg Chest Port 1v Same Day  01/29/2011  *RADIOLOGY REPORT*  Clinical Data: Central line placement  PORTABLE CHEST - 1 VIEW SAME DAY  Comparison: Portable exam 1900 hours compared to 1643 hours  Findings: New left subclavian central venous catheter, tip projecting over SVC at the level of the azygos confluence. Enlargement of cardiac silhouette post CABG. Pulmonary vascular congestion. Bibasilar infiltrates and question pleural effusions little changed. Atelectasis left mid lung. No pneumothorax following central line placement.  IMPRESSION: No pneumothorax following central line placement. Persistent bibasilar consolidation and question pleural effusions.  Original Report Authenticated By: Lollie Marrow, M.D.   Scheduled Meds:    . aspirin  325 mg Oral Daily  . docusate sodium  100 mg Oral BID  . enoxaparin (LOVENOX) injection  30 mg Subcutaneous Daily  . furosemide  160 mg Intravenous BID  . gabapentin  300 mg Oral TID  . insulin aspart  0-9 Units Subcutaneous TID WC  . polysaccharide iron  150 mg Oral Daily  . simvastatin  20 mg Oral QHS  . sodium chloride  10 mL Intracatheter Q12H  . sodium chloride      . DISCONTD: enoxaparin  40 mg Subcutaneous Q24H  . DISCONTD: folic acid  1 mg Oral Daily  .  DISCONTD: PARoxetine  20 mg Oral Q0700   Continuous Infusions:    . sodium chloride 75 mL/hr at 01/31/11 0900  . DISCONTD: DOPamine 5 mcg/kg/min (01/31/11 0900)   PRN Meds:.acetaminophen, albuterol, guaiFENesin-dextromethorphan, HYDROmorphone, ondansetron (ZOFRAN) IV, ondansetron, senna-docusate, sodium chloride, traMADol  Assessment/Plan: Principal Problem:  *Systolic CHF, acute on chronic Active Problems:  Bradycardia, improved  Hyperkalemia, resolved  ARF (acute renal failure), improving  Essential hypertension, benign  Coronary atherosclerosis of native coronary artery  DM type 2 with diabetic peripheral neuropathy  Anemia, probably iron deficiency post op  HYPERLIPIDEMIA-MIXED  Hyperphosphatemia  Bifascicular block  Patient's weight  has increased. Decrease IV fluids and continue IV Lasix. Stop dopamine and monitor heart rate. Transfuse one unit of blood. Continue iron supplementation. Continue step down monitoring. PT evaluation.   LOS: 2 days   Winfred Redel L 01/31/2011, 9:34 AM

## 2011-01-31 NOTE — Progress Notes (Signed)
Subjective:  Feeling better. Hoping to get up today.  She is experiencing no dyspnea nor chest discomfort.  Blood transfusion in progress.  Objective:  Vital Signs in the last 24 hours: Temp:  [97.4 F (36.3 C)-97.8 F (36.6 C)] 97.4 F (36.3 C) (12/05 0759) Pulse Rate:  [4-53] 51  (12/05 0800) Resp:  [9-21] 13  (12/05 0800) BP: (58-119)/(31-79) 90/37 mmHg (12/05 0800) SpO2:  [90 %-99 %] 97 % (12/05 0800) Weight:  [208 lb 3.2 oz (94.439 kg)] 208 lb 3.2 oz (94.439 kg) (12/05 0500)  Intake/Output from previous day: 12/04 0701 - 12/05 0700 In: 2940.7 [P.O.:430; I.V.:2410.7; IV Piggyback:100] Out: 1750 [Urine:1750] Intake/Output from this shift:  Physical Exam: NECK: Without JVD, HJR, or bruit LUNGS: Decreased breath sounds throughout with a few basilar crackles; basically clear HEART: Regular rate and rhythm, 2/6 systolic murmur at left sternal border,no gallop, rub, bruit, thrill, or heave EXTREMITIES: Without cyanosis, clubbing, 1+ ankle edema  Basename 01/31/11 0348 01/30/11 0445  WBC 4.7 6.5  HGB 7.9* 8.4*  PLT 195 255    Basename 01/31/11 0348 01/30/11 0445  NA 134* 133*  K 5.0 5.7*  CL 97 96  CO2 28 26  GLUCOSE 166* 84  BUN 108* 109*  CREATININE 3.06* 3.97*    Basename 01/30/11 0442 01/30/11 0027  TROPONINI <0.30 <0.30   Assessment/Plan:  1. Acute Systolic Heart failure: This is multifactorial with acute renal failure, bradycardia, anemia, hyperkalemia. She is 3 weeks post CABG (01/11/2011).  2.Dopamine for renal perfusion continues at 5 mcgs.  3. Bradycardia: She is now off of metoprolol and amiodarone. HR is in the 50's now with dopamine assist and better BP control, as she was hypotensive on admission. She did not tolerate BB at [facility] hospital post-operatively requiring a pacemaker temporarily. She is stable at this time, without need to place pacemaker at present.  4. CAD s/p CABG:  5. Hyperkalemia resolved 4. Acute renal failure:creatinine 3.06 today 5.  Anemia: Hemoglobin down to 7.9 today 6. Diminished breath sounds: Patient needs incentive sprirometry.   LOS: 2 days  [name] 01/31/2011, 8:39 AM   Cardiology Attending Patient interviewed and examined. Discussed with [name] Reedy, PA-C.  Above note annotated and modified based upon my findings.  Patient continues to improve, but has not yet diuresed. Dose of furosemide will be increased and normal saline infusion decreased. Transfused blood is providing additional volume. Creatinine has decreased over the past 24 hours and hopefully will continue to do so at an accelerating rate. She remains bradycardic, but not to an extent that would cause hemodynamic compromise.  I anticipate that all of her problems will resolve over the next week or 2, and that we can look forward to discharge in your future.  [name] Bing, MD 01/31/2011, 7:19 PM

## 2011-01-31 NOTE — Progress Notes (Signed)
Physical Therapy Evaluation Patient Details Name: [name] MRN: [id number] DOB: 09/02/1950 Today's Date: 01/31/2011 Time: 6213-0865.  Charges: 1 eval Problem List:  Patient Active Problem List  Diagnoses  . Type II or unspecified type diabetes mellitus without mention of complication, uncontrolled  . HYPERLIPIDEMIA-MIXED  . DEPRESSION  . Essential hypertension, benign  . DIVERTICULOSIS  . FATTY LIVER DISEASE  . ARTHRITIS  . Unstable angina  . Coronary atherosclerosis of native coronary artery  . Abnormal TSH  . Thrombocytopenia  . Carotid stenosis  . Atrial fibrillation  . Systolic CHF, acute on chronic  . DM type 2 with diabetic peripheral neuropathy  . Bradycardia  . Hyperkalemia  . Hyperphosphatemia  . ARF (acute renal failure)  . Bifascicular block  . Anemia    Past Medical History:  Past Medical History  Diagnosis Date  . Coronary atherosclerosis of native coronary artery     BMS circ 2001, DES LAD 2005, DES LAD and RCA 2008  . Drug allergy     Allergy to Plavix (hives) - took Ticlid in past  . Essential hypertension, benign   . Mixed hyperlipidemia   . Type 2 diabetes mellitus   . Breast cancer     Chemo and mastectomy  . Angina   . Myocardial infarction ~ 2002  . Diabetes mellitus   . Shortness of breath 01/05/11     "w/exertion"  . Arthritis     rheumatoid  . Depression   . Peripheral neuropathy     "from diabetes"  . Peptic ulcer disease ~ 1975   Past Surgical History:  Past Surgical History  Procedure Date  . Right mastectomy   . Mastectomy 1988    right  . Cholecystectomy 1969  . Abdominal hysterectomy 1989  . Tonsillectomy and adenoidectomy 1979  . Appendectomy 1969  . Tubal ligation 1972  . Coronary angioplasty with stent placement 4 Times    "had 2 stents each time; last time 2010"  . Cardiac catheterization 01/05/11    "no stents"  . Coronary artery bypass graft 01/11/2011    Procedure: CORONARY ARTERY BYPASS GRAFTING (CABG);   Surgeon: Kathlee Nations Suann Larry, MD;  Location: [facility] OR;  Service: Open Heart Surgery;  Laterality: N/A;  cabg x four, using left internal mammary artery and right leg greater saphenous vein harvestede endoscopically    PT Assessment/Plan/Recommendation PT Assessment Clinical Impression Statement: Pt is a pleasant 60 year old female referred to PT secondary to generalized weakness and decreased activity tolerance.  After examination it was found that she is currently limited by her decreased activity tolerance.  She will benefit from skilled PT in order to address the above impairement in order to safely return home. PT Recommendation/Assessment: Patient will need skilled PT in the acute care venue PT Problem List: Decreased strength;Decreased activity tolerance;Decreased mobility PT Therapy Diagnosis : Difficulty walking;Generalized weakness PT Plan PT Frequency: Min 3X/week PT Treatment/Interventions: Gait training;Functional mobility training;Therapeutic exercise;Patient/family education PT Recommendation Follow Up Recommendations: Home health PT PT Goals  Acute Rehab PT Goals PT Goal Formulation: With patient Time For Goal Achievement: 5 days Pt will Roll Supine to Left Side: Independently Pt will go Supine/Side to Sit: with modified independence;with HOB 0 degrees;with rail Pt will go Sit to Stand: with modified independence Pt will go Stand to Sit: with modified independence Pt will Ambulate: 51 - 150 feet;with modified independence  PT Evaluation Precautions/Restrictions  Precautions Precautions: Sternal;Fall Prior Functioning  Home Living Lives With: Spouse Receives Help From:  Family Type of Home: House Home Layout: Two level;Able to live on main level with bedroom/bathroom Home Access: Level entry Bathroom Shower/Tub: Tub/shower unit Bathroom Toilet: Standard Home Adaptive Equipment: Hand-held shower hose;Shower chair with back;Walker - standard;Bedside commode/3-in-1 Prior  Function Level of Independence: Independent with homemaking with ambulation Vocation: On disability Cognition Cognition Arousal/Alertness: Awake/alert Sensation/Coordination   Extremity Assessment RLE Strength RLE Overall Strength Comments: 4/5 overall LLE Strength LLE Overall Strength Comments: 4/5 overall Mobility (including Balance) Bed Mobility Sit to Supine - Left: 5: Supervision Transfers Sit to Stand: 5: Supervision Stand to Sit: 5: Supervision Ambulation/Gait Ambulation/Gait: Yes Ambulation/Gait Assistance: 5: Supervision Ambulation Distance (Feet): 5 Feet Assistive device: Rolling walker Gait Pattern: Step-through pattern Stairs: No Wheelchair Mobility Wheelchair Mobility: No    Exercise    End of Session PT - End of Session Equipment Utilized During Treatment: Gait belt Activity Tolerance: Patient limited by fatigue Patient left: in bed Nurse Communication: Mobility status for transfers;Mobility status for ambulation  Damon Baisch 01/31/2011, 1:47 PM

## 2011-02-01 DIAGNOSIS — I498 Other specified cardiac arrhythmias: Secondary | ICD-10-CM

## 2011-02-01 LAB — TYPE AND SCREEN: Unit division: 0

## 2011-02-01 LAB — BASIC METABOLIC PANEL
BUN: 93 mg/dL — ABNORMAL HIGH (ref 6–23)
Calcium: 9 mg/dL (ref 8.4–10.5)
GFR calc non Af Amer: 26 mL/min — ABNORMAL LOW (ref >90)
Glucose, Bld: 132 mg/dL — ABNORMAL HIGH (ref 70–99)
Sodium: 138 mEq/L (ref 135–145)

## 2011-02-01 LAB — GLUCOSE, CAPILLARY
Glucose-Capillary: 173 mg/dL — ABNORMAL HIGH (ref 70–99)
Glucose-Capillary: 217 mg/dL — ABNORMAL HIGH (ref 70–99)
Glucose-Capillary: 311 mg/dL — ABNORMAL HIGH (ref 70–99)

## 2011-02-01 MED ORDER — ENOXAPARIN SODIUM 40 MG/0.4ML ~~LOC~~ SOLN
40.0000 mg | Freq: Every day | SUBCUTANEOUS | Status: DC
  Administered 2011-02-02 – 2011-02-03 (×2): 40 mg via SUBCUTANEOUS
  Filled 2011-02-01 (×3): qty 0.4

## 2011-02-01 MED ORDER — FUROSEMIDE 10 MG/ML IJ SOLN
60.0000 mg | Freq: Four times a day (QID) | INTRAMUSCULAR | Status: DC
  Administered 2011-02-01 – 2011-02-02 (×4): 60 mg via INTRAVENOUS
  Filled 2011-02-01 (×4): qty 6

## 2011-02-01 MED ORDER — FUROSEMIDE 10 MG/ML IJ SOLN
INTRAMUSCULAR | Status: AC
  Filled 2011-02-01: qty 10

## 2011-02-01 NOTE — Progress Notes (Signed)
Attempted to start iv. Patient has very poor veins. Very sclerosed. Suggest that if patient need long term treatment after discharge she should have a PICC line. She will be placed on our  Do not attempt peripheral iv.

## 2011-02-01 NOTE — Progress Notes (Signed)
Subjective: Feels much better. Breathing easier. Swelling improved. More energy. Worked with physical therapy  Objective: Vital signs in last 24 hours: Filed Vitals:   02/01/11 0500 02/01/11 0600 02/01/11 0700 02/01/11 0800  BP: 119/49 113/39 110/41 131/41  Pulse: 58 58 57 61  Temp:   98 F (36.7 C)   TempSrc:   Oral   Resp: 12 10 9 16   Height:      Weight: 93.9 kg (207 lb 0.2 oz)     SpO2: 98% 99% 99% 99%   Weight change: -0.1 kg (-3.5 oz)  Intake/Output Summary (Last 24 hours) at 02/01/11 0912 Last data filed at 02/01/11 0600  Gross per 24 hour  Intake 1230.4 ml  Output   5050 ml  Net -3819.6 ml   Physical Exam: General: Much brighter. In chair. Lungs: Decreased at the bases no rales Cardiovascular regular rate rhythm Abdomen soft nontender Extremities trace pitting edema  Lab Results: Basic Metabolic Panel:  Lab 02/01/11 7829 01/31/11 0349 01/31/11 0348 01/29/11 1643  NA 138 -- 134* --  K 4.8 -- 5.0 --  CL 100 -- 97 --  CO2 31 -- 28 --  GLUCOSE 132* -- 166* --  BUN 93* -- 108* --  CREATININE 1.99* -- 3.06* --  CALCIUM 9.0 -- 8.9 --  MG -- -- -- 2.5  PHOS -- 7.4* -- 8.8*   Liver Function Tests:  Lab 01/29/11 1643  AST 33  ALT 31  ALKPHOS 80  BILITOT 0.5  PROT 7.6  ALBUMIN 3.4*   No results found for this basename: LIPASE:2,AMYLASE:2 in the last 168 hours No results found for this basename: AMMONIA:2 in the last 168 hours CBC:  Lab 02/01/11 0415 01/31/11 0348 01/30/11 0445 01/29/11 1643  WBC -- 4.7 6.5 --  NEUTROABS -- -- -- 7.5  HGB 9.1* 7.9* -- --  HCT 27.9* 24.5* -- --  MCV -- 95.3 94.8 --  PLT -- 195 255 --   Cardiac Enzymes:  Lab 01/30/11 0442 01/30/11 0027 01/29/11 1643  CKTOTAL 86 102 47  CKMB 2.7 2.9 2.9  CKMBINDEX -- -- --  TROPONINI <0.30 <0.30 <0.30   BNP:  Lab 01/29/11 1644  POCBNP 12246.0*   D-Dimer:  Lab 01/29/11 1643  DDIMER 6.13*   CBG:  Lab 02/01/11 0709 01/31/11 1649 01/31/11 1056 01/31/11 0720 01/30/11  2044 01/30/11 1648  GLUCAP 134* 139* 237* 175* 234* 169*   Hemoglobin A1C:  Lab 01/29/11 1643  HGBA1C 6.8*   Fasting Lipid Panel: No results found for this basename: CHOL,HDL,LDLCALC,TRIG,CHOLHDL,LDLDIRECT in the last 562 hours Thyroid Function Tests:  Lab 01/29/11 1643  TSH 0.980  T4TOTAL --  FREET4 --  T3FREE --  THYROIDAB --   Coagulation: No results found for this basename: LABPROT:4,INR:4 in the last 168 hours Anemia Panel:  Lab 01/30/11 0951 01/30/11 0441  VITAMINB12 -- 1274*  FOLATE -- >20.0  FERRITIN -- 258  TIBC -- 381  IRON -- 33*  RETICCTPCT 4.0* --   Urine Drug Screen: Drugs of Abuse  No results found for this basename: labopia,  cocainscrnur,  labbenz,  amphetmu,  thcu,  labbarb    Alcohol Level: No results found for this basename: ETH:2 in the last 168 hours Urinalysis: Results for [name], [name] (MRN [id number]) as of 01/30/2011 08:29  Ref. Range 01/29/2011 21:50  Color, Urine Latest Range: YELLOW  AMBER (A)  APPearance Latest Range: CLEAR  CLOUDY (A)  Specific Gravity, Urine Latest Range: 1.005-1.030  >1.030 (H)  pH Latest  Range: 5.0-8.0  5.0  Glucose, UA Latest Range: NEGATIVE mg/dL NEGATIVE  Bilirubin Urine Latest Range: NEGATIVE  SMALL (A)  Ketones, ur Latest Range: NEGATIVE mg/dL NEGATIVE  Protein Latest Range: NEGATIVE mg/dL 161 (A)  Urobilinogen, UA Latest Range: 0.0-1.0 mg/dL 0.2  Nitrite Latest Range: NEGATIVE  NEGATIVE  Leukocytes, UA Latest Range: NEGATIVE  TRACE (A)  WBC, UA Latest Range: <3 WBC/hpf 7-10  RBC / HPF Latest Range: <3 RBC/hpf 21-50  Squamous Epithelial / LPF Latest Range: RARE  MANY (A)  Bacteria, UA Latest Range: RARE  MANY (A)    Micro Results: Recent Results (from the past 240 hour(s))  MRSA PCR SCREENING     Status: Normal   Collection Time   01/29/11  5:55 PM      Component Value Range Status Comment   MRSA by PCR NEGATIVE  NEGATIVE  Final    Studies/Results: Dg Chest 1 View  01/31/2011  *RADIOLOGY REPORT*   Clinical Data: CHF  CHEST - 1 VIEW  Comparison: 01/29/2011  Findings: Stable left subclavian central venous catheter.  Moderate cardiomegaly.  Bibasilar atelectasis and bilateral pleural effusions stable.  No pneumothorax.  IMPRESSION: Stable bilateral effusions and bibasilar atelectasis.  Original Report Authenticated By: Donavan Burnet, M.D.   US Renal  01/31/2011  *RADIOLOGY REPORT*  Clinical Data: Acute renal failure.  RENAL/URINARY TRACT ULTRASOUND COMPLETE  Comparison:  CT scan 04/05/2005.  Findings:  Right Kidney:  13.0 cm in length. Normal renal cortical thickness and echogenicity without focal lesions or hydronephrosis. 1.3 cm echogenic focus is noted in the upper pole region.  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  Left Kidney:  11.7 cm in length. Normal renal cortical thickness and echogenicity without focal lesions or hydronephrosis.  Bladder:  Decompressed by a Foley catheter.  Additional findings:  Diffuse fatty infiltration of the liver is noted.  IMPRESSION:  1.  Normal renal cortical thickness and echogenicity without hydronephrosis. 2.  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 3.  Diffuse fatty infiltration of the liver.  Original Report Authenticated By: P. Loralie Champagne, M.D.   Scheduled Meds:    . aspirin  325 mg Oral Daily  . calcium acetate  667 mg Oral TID WC  . docusate sodium  100 mg Oral BID  . enoxaparin (LOVENOX) injection  30 mg Subcutaneous Daily  . furosemide  120 mg Intravenous Q6H  . gabapentin  300 mg Oral BID  . insulin aspart  0-9 Units Subcutaneous TID WC  . polysaccharide iron  150 mg Oral Daily  . simvastatin  20 mg Oral QHS  . sodium chloride  10 mL Intracatheter Q12H  . DISCONTD: furosemide  160 mg Intravenous BID  . DISCONTD: gabapentin  300 mg Oral TID   Continuous Infusions:    . sodium chloride 20 mL/hr at 02/01/11 0600  . DISCONTD: DOPamine 5 mcg/kg/min (01/31/11 0900)   PRN  Meds:.acetaminophen, albuterol, guaiFENesin-dextromethorphan, HYDROmorphone, ondansetron (ZOFRAN) IV, ondansetron, senna-docusate, sodium chloride, traMADol  Assessment/Plan: Principal Problem:  *Systolic CHF, acute on chronic Active Problems:  Bradycardia, improved  Hyperkalemia, resolved  ARF (acute renal failure), improving  Essential hypertension, benign  Coronary atherosclerosis of native coronary artery  DM type 2 with diabetic peripheral neuropathy  Anemia, probably iron deficiency post op  HYPERLIPIDEMIA-MIXED  Hyperphosphatemia  Bifascicular block  Looks much better today. Hemoglobin, edema, diuresis, bradycardia, creatinine, way all improved. Agree with transfer out of the unit. Peripheral IV  access has been problematic in the past this, so may need to continue with the triple lumen catheter for now.   LOS: 3 days   Clements Toro L 02/01/2011, 9:12 AM

## 2011-02-01 NOTE — Progress Notes (Signed)
Subjective: Feels much better. No chest pain. Appetite is good. She is moving around somewhat in the room with PT. Leg swelling is better.   Objective: Temp:  [97.9 F (36.6 C)-98.9 F (37.2 C)] 98 F (36.7 C) (12/06 0700) Pulse Rate:  [52-61] 61  (12/06 0800) Resp:  [9-23] 16  (12/06 0800) BP: (82-132)/(32-70) 131/41 mmHg (12/06 0800) SpO2:  [95 %-100 %] 99 % (12/06 0800) Weight:  [207 lb 0.2 oz (93.9 kg)] 207 lb 0.2 oz (93.9 kg) (12/06 0500)  Weight change: -3.5 oz (-0.1 kg)  I/O last 3 completed shifts: In: 2574.6 [P.O.:360; I.V.:1652.6; Blood:362; IV Piggyback:200] Out: 6100 [Urine:6100]  Telemetry - Sinus rhythm.  Exam -   General - NAD.  Lungs - Decreased breath sounds at the bases consistent with effusions.  Cardiac - Regular rate and rhythm, no S3.  Abdomen - NABS.  Extremities - Trace to 1+ edema, improved overall.  Testing -   Lab Results  Component Value Date   WBC 4.7 01/31/2011   HGB 9.1* 02/01/2011   HCT 27.9* 02/01/2011   MCV 95.3 01/31/2011   PLT 195 01/31/2011    Lab Results  Component Value Date   CREATININE 1.99* 02/01/2011   BUN 93* 02/01/2011   NA 138 02/01/2011   K 4.8 02/01/2011   CL 100 02/01/2011   CO2 31 02/01/2011    Lab Results  Component Value Date   CKTOTAL 86 01/30/2011   CKMB 2.7 01/30/2011   TROPONINI <0.30 01/30/2011    ECG -  Renal ultrasound 1. Normal renal cortical thickness and echogenicity without hydronephrosis. 2. 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 3. Diffuse fatty infiltration of the liver.  CXR Stable bilateral effusions and bibasilar atelectasis.   Current Medications    . aspirin  325 mg Oral Daily  . calcium acetate  667 mg Oral TID WC  . docusate sodium  100 mg Oral BID  . enoxaparin (LOVENOX) injection  30 mg Subcutaneous Daily  . furosemide  120 mg Intravenous Q6H  . gabapentin  300 mg Oral BID  . insulin aspart  0-9 Units  Subcutaneous TID WC  . polysaccharide iron  150 mg Oral Daily  . simvastatin  20 mg Oral QHS  . sodium chloride  10 mL Intracatheter Q12H  . DISCONTD: furosemide  160 mg Intravenous BID  . DISCONTD: gabapentin  300 mg Oral TID     Assessment:  1. Bradycardia, improved now off of amiodarone and metoprolol.  2. Acute on chronic systolic heart failure complicated by above, improving with better diuresis.  LVEF 40-45% by most recent echocardiogram. On high-dose intravenous Lasix. Has been taken off of renal dose dopamine.  3. Acute renal insufficiency, creatinine improving, down to 1.9. Nephrology has been following. Renal ultrasound largely unremarkable. Suspect related to decreased perfusion in the setting of her presentation with bradycardia and relative hypotension.  4. Multivessel CAD status post recent CABG.  5. Hyperlipidemia, on simvastatin.  6. Anemia, status post packed red cell transfusion.   Plan:  Continue aspirin, Zocor, intravenous Lasix with plan for further diuresis. She is on DVT prophylaxis dose Lovenox. Would try to obtain peripheral IV access so that central line can be discontinued. She can likely be transferred out to the telemetry unit, and continue to increase activity with PT. We will need to hold off on any rate lowering medications, including amiodarone and metoprolol, and will also plan to hold off  ACE inhibitor, ARB, and Aldactone in light of her renal insufficiency. With LV dysfunction, medical regimen will need to be further titrated over time, depending on her hemodynamics and return to baseline creatinine. Also need to watch anemia. At this point, does not look like pacemaker will need to be considered.  Jonelle Sidle, M.D., F.A.C.C.

## 2011-02-01 NOTE — Progress Notes (Signed)
Subjective: Interval History: has no complaint of nausea or vomiting. Overall patient seems to be feeling better. She denies any orthopnea or paroxysmal nocturnal dyspnea. Her she still feels somewhat weak. And appetite is good..  Objective: Vital signs in last 24 hours: Temp:  [97.9 F (36.6 C)-98.9 F (37.2 C)] 98 F (36.7 C) (12/06 0700) Pulse Rate:  [52-62] 62  (12/06 0900) Resp:  [9-23] 20  (12/06 0900) BP: (82-132)/(32-70) 124/37 mmHg (12/06 0900) SpO2:  [92 %-100 %] 92 % (12/06 0900) Weight:  [93.9 kg (207 lb 0.2 oz)] 207 lb 0.2 oz (93.9 kg) (12/06 0500) Weight change: -0.1 kg (-3.5 oz)  Intake/Output from previous day: 12/05 0701 - 12/06 0700 In: 1567.2 [P.O.:360; I.V.:645.2; Blood:362; IV Piggyback:200] Out: 5050 [Urine:5050] Intake/Output this shift:    General appearance: alert, cooperative and no distress Resp: clear to auscultation bilaterally Cardio: regular rate and rhythm, S1, S2 normal, no murmur, click, rub or gallop GI: soft, non-tender; bowel sounds normal; no masses,  no organomegaly Extremities: edema She has the 2+ edema on her right and probably 1+ edema on the left  Lab Results:  Basename 02/01/11 0415 01/31/11 0348 01/30/11 0445  WBC -- 4.7 6.5  HGB 9.1* 7.9* --  HCT 27.9* 24.5* --  PLT -- 195 255   BMET:  Basename 02/01/11 0415 01/31/11 0348  NA 138 134*  K 4.8 5.0  CL 100 97  CO2 31 28  GLUCOSE 132* 166*  BUN 93* 108*  CREATININE 1.99* 3.06*  CALCIUM 9.0 8.9   No results found for this basename: PTH:2 in the last 72 hours Iron Studies:  Basename 01/30/11 0441  IRON 33*  TIBC 381  TRANSFERRIN --  FERRITIN 258    Studies/Results: Dg Chest 1 View  01/31/2011  *RADIOLOGY REPORT*  Clinical Data: CHF  CHEST - 1 VIEW  Comparison: 01/29/2011  Findings: Stable left subclavian central venous catheter.  Moderate cardiomegaly.  Bibasilar atelectasis and bilateral pleural effusions stable.  No pneumothorax.  IMPRESSION: Stable bilateral  effusions and bibasilar atelectasis.  Original Report Authenticated By: Donavan Burnet, M.D.   US Renal  01/31/2011  *RADIOLOGY REPORT*  Clinical Data: Acute renal failure.  RENAL/URINARY TRACT ULTRASOUND COMPLETE  Comparison:  CT scan 04/05/2005.  Findings:  Right Kidney:  13.0 cm in length. Normal renal cortical thickness and echogenicity without focal lesions or hydronephrosis. 1.3 cm echogenic focus is noted in the upper pole region.  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  Left Kidney:  11.7 cm in length. Normal renal cortical thickness and echogenicity without focal lesions or hydronephrosis.  Bladder:  Decompressed by a Foley catheter.  Additional findings:  Diffuse fatty infiltration of the liver is noted.  IMPRESSION:  1.  Normal renal cortical thickness and echogenicity without hydronephrosis. 2.  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 3.  Diffuse fatty infiltration of the liver.  Original Report Authenticated By: P. Loralie Champagne, M.D.    I have reviewed the patient's current medications.  Assessment/Plan: Problem #1 acute kidney injury patient BUN and creatinine seems to be improving progressively. Her BUN is 93 creatinine is 1.99. Patient doesn't have any uremic sinus symptoms. Problem #2 Hyperkalemia potassium is 4.8 it has improved. Problem #3 history of CHF she is on Lasix her urine output seems to be good. And patient has lost about 2 and half to 3 L in last 24 hours. Patient is still seems to edema. Problem #  4 history of hypertension blood pressure seems to be controlled very well Problem #5 history of coronary artery disease she status post CABG presently she denies any chest pain. Problem #6 history of bradycardia Problem #7 history of hyperphosphatemia this is related to her renal failure. Problem #8 history of anemia Problem #9 history of diabetes. Recommendation we'll change her Lasix to 60 mg IV twice a  day We'll check her basic metabolic panel CBC and phosphorus in the morning. We'll continue his other medications.    LOS: 3 days   Mujtaba Bollig S 02/01/2011,9:17 AM

## 2011-02-01 NOTE — Progress Notes (Signed)
Pt is alert and oriented x3, VSS.  Francie Massing, RN gave report to N. Rubye Oaks, RN on Dept 300.  Pt transferred to room 307 via chair by Rosalene Billings, RN.  Pt has an order to be placed on telemetry which was communicated to Jonnie Kind, Charity fundraiser.  Pt's belongings taken with the pt to the room.

## 2011-02-02 LAB — BASIC METABOLIC PANEL
GFR calc Af Amer: 47 mL/min — ABNORMAL LOW (ref >90)
GFR calc non Af Amer: 40 mL/min — ABNORMAL LOW (ref >90)
Potassium: 4.5 mEq/L (ref 3.5–5.1)
Sodium: 139 mEq/L (ref 135–145)

## 2011-02-02 LAB — GLUCOSE, CAPILLARY: Glucose-Capillary: 169 mg/dL — ABNORMAL HIGH (ref 70–99)

## 2011-02-02 LAB — CBC
Hemoglobin: 9.9 g/dL — ABNORMAL LOW (ref 12.0–15.0)
RBC: 3.24 MIL/uL — ABNORMAL LOW (ref 3.87–5.11)
WBC: 3.3 10*3/uL — ABNORMAL LOW (ref 4.0–10.5)

## 2011-02-02 LAB — PHOSPHORUS: Phosphorus: 4.1 mg/dL (ref 2.3–4.6)

## 2011-02-02 MED ORDER — FUROSEMIDE 40 MG PO TABS
40.0000 mg | ORAL_TABLET | Freq: Two times a day (BID) | ORAL | Status: DC
  Administered 2011-02-02 – 2011-02-06 (×9): 40 mg via ORAL
  Filled 2011-02-02 (×10): qty 1

## 2011-02-02 MED ORDER — INSULIN GLARGINE 100 UNIT/ML ~~LOC~~ SOLN
10.0000 [IU] | Freq: Every day | SUBCUTANEOUS | Status: DC
  Administered 2011-02-02: 10 [IU] via SUBCUTANEOUS
  Filled 2011-02-02: qty 3

## 2011-02-02 MED ORDER — FUROSEMIDE 10 MG/ML IJ SOLN: 40.0000 mg | Freq: Four times a day (QID) | INTRAMUSCULAR | Status: DC

## 2011-02-02 NOTE — Progress Notes (Signed)
Subjective: Interval History: has no complaint of nausea or vomiting. She denies also any difficulty breathing. No orthopnea or paroxysmal nocturnal dyspnea..  Objective: Vital signs in last 24 hours: Temp:  [97.7 F (36.5 C)-98.2 F (36.8 C)] 97.7 F (36.5 C) (12/07 0647) Pulse Rate:  [59-68] 64  (12/07 0647) Resp:  [10-18] 18  (12/07 0647) BP: (81-149)/(39-73) 120/73 mmHg (12/07 0647) SpO2:  [90 %-96 %] 90 % (12/07 0647) Weight:  [83.553 kg (184 lb 3.2 oz)] 184 lb 3.2 oz (83.553 kg) (12/07 0900) Weight change:   Intake/Output from previous day: 12/06 0701 - 12/07 0700 In: 232 [P.O.:180; I.V.:40; IV Piggyback:12] Out: 6351 [Urine:6350; Stool:1] Intake/Output this shift: Total I/O In: 10 [I.V.:10] Out: -   General appearance: alert, cooperative and no distress Resp: clear to auscultation bilaterally Cardio: regular rate and rhythm, S1, S2 normal, no murmur, click, rub or gallop GI: soft, non-tender; bowel sounds normal; no masses,  no organomegaly Extremities: edema 1+ edema bilaterally  Lab Results:  Basename 02/02/11 0521 02/01/11 0415 01/31/11 0348  WBC 3.3* -- 4.7  HGB 9.9* 9.1* --  HCT 30.5* 27.9* --  PLT 147* -- 195   BMET:  Basename 02/02/11 0521 02/01/11 0415  NA 139 138  K 4.5 4.8  CL 96 100  CO2 35* 31  GLUCOSE 196* 132*  BUN 70* 93*  CREATININE 1.38* 1.99*  CALCIUM 9.8 9.0   No results found for this basename: PTH:2 in the last 72 hours Iron Studies: No results found for this basename: IRON,TIBC,TRANSFERRIN,FERRITIN in the last 72 hours  Studies/Results: US Renal  01/31/2011  *RADIOLOGY REPORT*  Clinical Data: Acute renal failure.  RENAL/URINARY TRACT ULTRASOUND COMPLETE  Comparison:  CT scan 04/05/2005.  Findings:  Right Kidney:  13.0 cm in length. Normal renal cortical thickness and echogenicity without focal lesions or hydronephrosis. 1.3 cm echogenic focus is noted in the upper pole region.  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  Left  Kidney:  11.7 cm in length. Normal renal cortical thickness and echogenicity without focal lesions or hydronephrosis.  Bladder:  Decompressed by a Foley catheter.  Additional findings:  Diffuse fatty infiltration of the liver is noted.  IMPRESSION:  1.  Normal renal cortical thickness and echogenicity without hydronephrosis. 2.  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 3.  Diffuse fatty infiltration of the liver.  Original Report Authenticated By: P. Loralie Champagne, M.D.    I have reviewed the patient'[name] current medications.  Assessment/Plan: Problem #1 acute kidney injury BUN is 70 Ms. 1.38 progressively improving. And patient is a symptomatic. Problem #2 hyperkalemia potassium is 4.5 corrected Problem #3 history of anemia her hemoglobin and hematocrit is stable Problem #4 history of CHF she is on Lasix she is a good urine output and her CHF seems to be improving. She has still some edema. Problem #4 history of hypertension blood pressure seems to be reasonably controlled Problem #6 history of bradycardia Problem #7 history of diabetes Problem #8 history of a trial fibrillation her heart rate is controlled. Recommendation we'll decrease her Lasix to 40 mg IV twice a day We'll check  basic metabolic panel in the morning.    LOS: 4 days   [name] 02/02/2011,9:37 AM

## 2011-02-02 NOTE — Progress Notes (Addendum)
SUBJECTIVE:Tired. Did not sleep well. Continues soreness in her chest.  Principal Problem:  *Systolic CHF, acute on chronic Active Problems:  Coronary atherosclerosis of native coronary artery  Essential hypertension, benign  HYPERLIPIDEMIA-MIXED  DM type 2 with diabetic peripheral neuropathy  Bradycardia  Hyperkalemia  Hyperphosphatemia  ARF (acute renal failure)  Bifascicular block  Anemia   LABS: Basic Metabolic Panel:  Basename 02/02/11 0521 02/01/11 0415 01/31/11 0349  NA 139 138 --  K 4.5 4.8 --  CL 96 100 --  CO2 35* 31 --  GLUCOSE 196* 132* --  BUN 70* 93* --  CREATININE 1.38* 1.99* --  CALCIUM 9.8 9.0 --  MG -- -- --  PHOS 4.1 -- 7.4*   CBC:  Basename 02/02/11 0521 02/01/11 0415 01/31/11 0348  WBC 3.3* -- 4.7  NEUTROABS -- -- --  HGB 9.9* 9.1* --  HCT 30.5* 27.9* --  MCV 94.1 -- 95.3  PLT 147* -- 195   RADIOLOGY: US Renal  01/31/2011  *RADIOLOGY REPORT*  Clinical Data: Acute renal failure.  RENAL/URINARY TRACT ULTRASOUND COMPLETE  Comparison:  CT scan 04/05/2005.  Findings:  Right Kidney:  13.0 cm in length. Normal renal cortical thickness and echogenicity without focal lesions or hydronephrosis. 1.3 cm echogenic focus is noted in the upper pole region.  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  Left Kidney:  11.7 cm in length. Normal renal cortical thickness and echogenicity without focal lesions or hydronephrosis.  Bladder:  Decompressed by a Foley catheter.  Additional findings:  Diffuse fatty infiltration of the liver is noted.  IMPRESSION:  1.  Normal renal cortical thickness and echogenicity without hydronephrosis. 2.  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 3.  Diffuse fatty infiltration of the liver.  Original Report Authenticated By: P. Loralie Champagne, M.D.     PHYSICAL EXAM BP 120/73  Pulse 64  Temp(Src) 97.7 F (36.5 C) (Oral)  Resp 18  Ht 5\' 5"  (1.651 m)  Wt 184 lb 3.2 oz  (83.553 kg)  BMI 30.65 kg/m2  SpO2 90%  LMP 11/09/1997Urine output 6099!  General: Well developed, well nourished, in no acute distress Head: Eyes PERRLA, No xanthomas.   Normal cephalic and atramatic  Lungs: Clear bilaterally to auscultation and percussion. Heart: HRRR S1 S2, No MRG .  Pulses are 2+ & equal.            No carotid bruit. No JVD.  No abdominal bruits. No femoral bruits. Abdomen: Bowel sounds are positive, abdomen soft and non-tender without masses or                  Hernia's noted. Msk:  Back normal, normal gait. Normal strength and tone for age. Extremities: No clubbing, cyanosis or edema.  DP +1 Neuro: Alert and oriented X 3. Psych:  Good affect, responds appropriately  TELEMETRY: Reviewed telemetry pt in NSR rates in the 60's. :  ASSESSMENT AND PLAN:  1. Acute on Chronic Systolic Heart Failure: She is substantially improved with excellent diuresis. Her wt is down 23 lbs from admission. Breathing status is significantly improved.  Hyperkalemia has normalized. Will d/c foley catheter, change IV lasix to 40 mg BID.  Continue current medications. Consider restarting ACE inhibitor now that acute hyperkalemia is resolved. Will discuss with Dr. Diona Browner.  2. Deconditioning: Will have PT see her to aid in strenghtening.  3. Bradycardia: Much improved of both metoprolol and amiodarone. Will follow this.  With  increased activity and wash out of amiodarone, HR may increase over time. She has had no recurrence of Atrial fiv during this admission.   4. Hypotension: Stable. She is on no antihypertensives at this time.  Bettey Mare. Lyman Bishop NP [facility] 02/02/2011, 9:02 AM   Attending note:  Patient seen and examined. Clinically much better, although will need further work with PT/OT, also likely at home. Rhythm and blood pressure are stable.  1. Bradycardia, improved now off of amiodarone and metoprolol. Will repeat followup ECG to assess intrinsic conduction.  Doubt that she will need a pacemaker.  2. Acute on chronic systolic heart failure complicated by above, improving with diuresis. LVEF 40-45% by most recent echocardiogram. She has had a significant weight loss. Change Lasix to 40 mg p.o. B.i.d., and further down titrate if necessary.  3. Acute renal insufficiency, creatinine improving, down to 1.38. Potassium normal.  4. Multivessel CAD status post recent CABG.   5. Hyperlipidemia, on simvastatin.   6. Anemia, status post packed red cell transfusion. Stable.  Jonelle Sidle, M.D., F.A.C.C.

## 2011-02-02 NOTE — Progress Notes (Signed)
UR Chart Review Completed  

## 2011-02-02 NOTE — Progress Notes (Signed)
Physical Therapy Treatment Patient Details Name: DESTINEY SANABIA MRN: [id number] DOB: [birth year] Today's Date: 02/02/2011  PT Assessment/Plan  PT - Assessment/Plan Comments on Treatment Session: Pt is significantly weaker today than when 1st evaluated on 01-31-11...general LE strength now  3-/5, bed mobility and transfers are more labored...it was noted that O2 sat was  only 88% on RA.Marland KitchenMarland KitchenI notified RN and we initiated O2 at 2L/min...O2 sat rebounded to 95%...if overall malaise is not improved by next week, we may want to look at SNF  at d/c PT Plan: Discharge plan remains appropriate;Frequency remains appropriate PT Goals  Acute Rehab PT Goals PT Goal: Rolling Supine to Left Side - Progress: Progressing toward goal PT Goal: Supine/Side to Sit - Progress: Progressing toward goal PT Goal: Sit to Stand - Progress: Progressing toward goal PT Goal: Stand to Sit - Progress: Progressing toward goal PT Goal: Ambulate - Progress: Progressing toward goal  PT Treatment Precautions/Restrictions  Precautions Precautions: Sternal;Fall Restrictions Weight Bearing Restrictions: No Mobility (including Balance) Bed Mobility Rolling Left: 4: Min assist Left Sidelying to Sit: 3: Mod assist;HOB elevated (comment degrees) (HOB at 45deg) Left Sidelying to Sit Details (indicate cue type and reason): pt very weak and has difficulty sliding LEs off of bed, assist needed to control trunk Sit to Supine - Left: 4: Min assist Transfers Sit to Stand: 5: Supervision Sit to Stand Details (indicate cue type and reason): transfer very slow and labored Stand to Sit: 5: Supervision Ambulation/Gait Ambulation/Gait Assistance: 4: Min assist Ambulation/Gait Assistance Details (indicate cue type and reason): assist needed to help guide walker, verbal cues for increased thoracic extension Assistive device: Rolling walker Gait Pattern: Trunk flexed;Step-through pattern Stairs: No Wheelchair Mobility Wheelchair Mobility:  No    Exercise  General Exercises - Lower Extremity Ankle Circles/Pumps: AROM;Both;10 reps;Supine Quad Sets: AROM;Both;10 reps;Supine Gluteal Sets: AROM;Both;10 reps;Supine Short Arc Quad: AROM;10 reps;Both;Supine Heel Slides: 10 reps;Both;Supine Hip ABduction/ADduction: AROM;Both;10 reps;Supine End of Session PT - End of Session Equipment Utilized During Treatment: Gait belt Activity Tolerance: Patient limited by fatigue Patient left: in chair;with call bell in reach;with family/visitor present Nurse Communication: Mobility status for transfers General Behavior During Session: [facility] for tasks performed Cognition: [facility] for tasks performed  Konrad Penta 02/02/2011, 2:20 PM

## 2011-02-02 NOTE — Progress Notes (Addendum)
Subjective: Felt slightly short of breath with oxygen off  Objective: Vital signs in last 24 hours: Filed Vitals:   02/01/11 2300 02/02/11 0647 02/02/11 0900 02/02/11 1405  BP: 149/63 120/73  130/62  Pulse: 68 64  67  Temp: 97.8 F (36.6 C) 97.7 F (36.5 C)    TempSrc: Oral Oral    Resp: 18 18    Height:      Weight:   83.553 kg (184 lb 3.2 oz)   SpO2: 91% 90%  88%   Weight change:   Intake/Output Summary (Last 24 hours) at 02/02/11 1447 Last data filed at 02/02/11 0923  Gross per 24 hour  Intake     16 ml  Output   5951 ml  Net  -5935 ml   Physical Exam: General: fatigued and weak appearing. In chair. Lungs: Decreased at the bases no rales Cardiovascular regular rate rhythm Abdomen soft nontender Extremities 1+ pitting edema  Lab Results: Basic Metabolic Panel:  Lab 02/02/11 2130 02/01/11 0415 01/31/11 0349 01/29/11 1643  NA 139 138 -- --  K 4.5 4.8 -- --  CL 96 100 -- --  CO2 35* 31 -- --  GLUCOSE 196* 132* -- --  BUN 70* 93* -- --  CREATININE 1.38* 1.99* -- --  CALCIUM 9.8 9.0 -- --  MG -- -- -- 2.5  PHOS 4.1 -- 7.4* --   Liver Function Tests:  Lab 01/29/11 1643  AST 33  ALT 31  ALKPHOS 80  BILITOT 0.5  PROT 7.6  ALBUMIN 3.4*   No results found for this basename: LIPASE:2,AMYLASE:2 in the last 168 hours No results found for this basename: AMMONIA:2 in the last 168 hours CBC:  Lab 02/02/11 0521 02/01/11 0415 01/31/11 0348 01/29/11 1643  WBC 3.3* -- 4.7 --  NEUTROABS -- -- -- 7.5  HGB 9.9* 9.1* -- --  HCT 30.5* 27.9* -- --  MCV 94.1 -- 95.3 --  PLT 147* -- 195 --   Cardiac Enzymes:  Lab 01/30/11 0442 01/30/11 0027 01/29/11 1643  CKTOTAL 86 102 47  CKMB 2.7 2.9 2.9  CKMBINDEX -- -- --  TROPONINI <0.30 <0.30 <0.30   BNP:  Lab 01/29/11 1644  POCBNP 12246.0*   D-Dimer:  Lab 01/29/11 1643  DDIMER 6.13*   CBG:  Lab 02/02/11 1109 02/02/11 0727 02/01/11 2101 02/01/11 1714 02/01/11 1119 02/01/11 0709  GLUCAP 192* 169* 311* 217*  173* 134*   Hemoglobin A1C:  Lab 01/29/11 1643  HGBA1C 6.8*   Fasting Lipid Panel: No results found for this basename: CHOL,HDL,LDLCALC,TRIG,CHOLHDL,LDLDIRECT in the last 865 hours Thyroid Function Tests:  Lab 01/29/11 1643  TSH 0.980  T4TOTAL --  FREET4 --  T3FREE --  THYROIDAB --   Coagulation: No results found for this basename: LABPROT:4,INR:4 in the last 168 hours Anemia Panel:  Lab 01/30/11 0951 01/30/11 0441  VITAMINB12 -- 1274*  FOLATE -- >20.0  FERRITIN -- 258  TIBC -- 381  IRON -- 33*  RETICCTPCT 4.0* --   Urine Drug Screen: Drugs of Abuse  No results found for this basename: labopia,  cocainscrnur,  labbenz,  amphetmu,  thcu,  labbarb    Alcohol Level: No results found for this basename: ETH:2 in the last 168 hours Urinalysis: Results for [name], [name] (MRN [id number]) as of 01/30/2011 08:29  Ref. Range 01/29/2011 21:50  Color, Urine Latest Range: YELLOW  AMBER (A)  APPearance Latest Range: CLEAR  CLOUDY (A)  Specific Gravity, Urine Latest Range: 1.005-1.030  >1.030 (H)  pH Latest Range: 5.0-8.0  5.0  Glucose, UA Latest Range: NEGATIVE mg/dL NEGATIVE  Bilirubin Urine Latest Range: NEGATIVE  SMALL (A)  Ketones, ur Latest Range: NEGATIVE mg/dL NEGATIVE  Protein Latest Range: NEGATIVE mg/dL 960 (A)  Urobilinogen, UA Latest Range: 0.0-1.0 mg/dL 0.2  Nitrite Latest Range: NEGATIVE  NEGATIVE  Leukocytes, UA Latest Range: NEGATIVE  TRACE (A)  WBC, UA Latest Range: <3 WBC/hpf 7-10  RBC / HPF Latest Range: <3 RBC/hpf 21-50  Squamous Epithelial / LPF Latest Range: RARE  MANY (A)  Bacteria, UA Latest Range: RARE  MANY (A)    Micro Results: Recent Results (from the past 240 hour(s))  MRSA PCR SCREENING     Status: Normal   Collection Time   01/29/11  5:55 PM      Component Value Range Status Comment   MRSA by PCR NEGATIVE  NEGATIVE  Final    Studies/Results: US Renal  01/31/2011  *RADIOLOGY REPORT*  Clinical Data: Acute renal failure.  RENAL/URINARY  TRACT ULTRASOUND COMPLETE  Comparison:  CT scan 04/05/2005.  Findings:  Right Kidney:  13.0 cm in length. Normal renal cortical thickness and echogenicity without focal lesions or hydronephrosis. 1.3 cm echogenic focus is noted in the upper pole region.  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  Left Kidney:  11.7 cm in length. Normal renal cortical thickness and echogenicity without focal lesions or hydronephrosis.  Bladder:  Decompressed by a Foley catheter.  Additional findings:  Diffuse fatty infiltration of the liver is noted.  IMPRESSION:  1.  Normal renal cortical thickness and echogenicity without hydronephrosis. 2.  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 3.  Diffuse fatty infiltration of the liver.  Original Report Authenticated By: P. Loralie Champagne, M.D.   Scheduled Meds:    . aspirin  325 mg Oral Daily  . calcium acetate  667 mg Oral TID WC  . docusate sodium  100 mg Oral BID  . enoxaparin (LOVENOX) injection  40 mg Subcutaneous Daily  . furosemide  40 mg Oral BID  . gabapentin  300 mg Oral BID  . insulin aspart  0-9 Units Subcutaneous TID WC  . polysaccharide iron  150 mg Oral Daily  . simvastatin  20 mg Oral QHS  . sodium chloride  10 mL Intracatheter Q12H  . DISCONTD: enoxaparin (LOVENOX) injection  30 mg Subcutaneous Daily  . DISCONTD: furosemide  40 mg Intravenous Q6H  . DISCONTD: furosemide  60 mg Intravenous Q6H   Continuous Infusions:   PRN Meds:.acetaminophen, albuterol, guaiFENesin-dextromethorphan, HYDROmorphone, ondansetron (ZOFRAN) IV, ondansetron, senna-docusate, sodium chloride, traMADol  Assessment/Plan: Principal Problem:  *Systolic CHF, acute on chronic Active Problems:  Bradycardia, improved  Hyperkalemia, resolved  ARF (acute renal failure), resolving  Essential hypertension, benign  Coronary atherosclerosis of native coronary artery  DM type 2 with diabetic peripheral neuropathy. Will  resume lantus at lower dose  Anemia, probably iron deficiency post op, s/p transfusion of PRBC  HYPERLIPIDEMIA-MIXED  Hyperphosphatemia  Bifascicular block  Continue diuresis, daily labs. Hopefully, home in 24 to 48 hours if stable.   LOS: 4 days   Stepahnie Campo L 02/02/2011, 2:47 PM

## 2011-02-02 NOTE — Progress Notes (Signed)
Inpatient Diabetes Program Recommendations  AACE/ADA: New Consensus Statement on Inpatient Glycemic Control (2009)  Target Ranges:  Prepandial:   less than 140 mg/dL      Peak postprandial:   less than 180 mg/dL (1-2 hours)      Critically ill patients:  140 - 180 mg/dL   Reason for Visit: Elevated prandial glucose:  134, 173, 217, 311 mg/dL  Inpatient Diabetes Program Recommendations Insulin - Basal: Add low dose Lantus 10-15 units daily Insulin - Meal Coverage: Add Novolog 2 units TID for meal coverage

## 2011-02-03 LAB — BASIC METABOLIC PANEL
BUN: 48 mg/dL — ABNORMAL HIGH (ref 6–23)
CO2: 37 mEq/L — ABNORMAL HIGH (ref 19–32)
Calcium: 10.4 mg/dL (ref 8.4–10.5)
Creatinine, Ser: 1.17 mg/dL — ABNORMAL HIGH (ref 0.50–1.10)
Glucose, Bld: 221 mg/dL — ABNORMAL HIGH (ref 70–99)

## 2011-02-03 LAB — CBC
MCH: 30.7 pg (ref 26.0–34.0)
MCHC: 32.9 g/dL (ref 30.0–36.0)
MCV: 93.2 fL (ref 78.0–100.0)
Platelets: 135 10*3/uL — ABNORMAL LOW (ref 150–400)
RDW: 14.7 % (ref 11.5–15.5)

## 2011-02-03 LAB — GLUCOSE, CAPILLARY: Glucose-Capillary: 252 mg/dL — ABNORMAL HIGH (ref 70–99)

## 2011-02-03 MED ORDER — INSULIN GLARGINE 100 UNIT/ML ~~LOC~~ SOLN
17.0000 [IU] | Freq: Every day | SUBCUTANEOUS | Status: DC
  Administered 2011-02-03 – 2011-02-04 (×2): 17 [IU] via SUBCUTANEOUS

## 2011-02-03 NOTE — Progress Notes (Signed)
Subjective: Interval History: has no complaint of nausea or vomiting.  Overall she is feeling better. She denies any difficulty increasing..  Objective: Vital signs in last 24 hours: Temp:  [97.6 F (36.4 C)-98.5 F (36.9 C)] 98.5 F (36.9 C) (12/08 0542) Pulse Rate:  [67-76] 76  (12/08 0542) Resp:  [16-20] 16  (12/08 0542) BP: (108-146)/(62-67) 108/66 mmHg (12/08 0542) SpO2:  [88 %-98 %] 91 % (12/08 0542) Weight:  [81.103 kg (178 lb 12.8 oz)-83.553 kg (184 lb 3.2 oz)] 178 lb 12.8 oz (81.103 kg) (12/08 0542) Weight change:   Intake/Output from previous day: 12/07 0701 - 12/08 0700 In: 1450 [P.O.:1440; I.V.:10] Out: 4175 [Urine:4175] Intake/Output this shift:    General appearance: alert, cooperative and no distress Resp: clear to auscultation bilaterally Cardio: regular rate and rhythm, S1, S2 normal, no murmur, click, rub or gallop GI: soft, non-tender; bowel sounds normal; no masses,  no organomegaly Extremities: edema 1-2+ edema bilaterally  Lab Results:  Basename 02/03/11 0549 02/02/11 0521  WBC 4.1 3.3*  HGB 10.3* 9.9*  HCT 31.3* 30.5*  PLT 135* 147*   BMET:  Basename 02/03/11 0549 02/02/11 0521  NA 138 139  K 4.1 4.5  CL 93* 96  CO2 37* 35*  GLUCOSE 221* 196*  BUN 48* 70*  CREATININE 1.17* 1.38*  CALCIUM 10.4 9.8   No results found for this basename: PTH:2 in the last 72 hours Iron Studies: No results found for this basename: IRON,TIBC,TRANSFERRIN,FERRITIN in the last 72 hours  Studies/Results: No results found.  I have reviewed the patient's current medications.  Assessment/Plan: Problem #1 acute kidney injury BUN is 48 creatinine is 1.17 renal function seems to be improving. Problem #2 history of hyperkalemia potassium is 4.1 which is good Problem #3 history of CHF patient still with good urine output she is about 2 and have it is negative today still she has some swelling of the legs. However her patient is a symptomatic. Problem #4 history of  coronary artery disease status post CABG she is a symptomatic. Problem #5 history of hypertension blood pressure seems to be controlled very well Problem #6 history of bradycardia Problem #7 history of a trial fibrillation her heart rate is controlled Problem #8 history of arthritis to Problem #9 history of depression. Recommendation we'll DC IV Lasix Demadex 40 mg once a day on when necessary basis. Since her renal function has improved to her Sine-Off.    LOS: 5 days   Basma Buchner S 02/03/2011,7:54 AM

## 2011-02-03 NOTE — Progress Notes (Signed)
Pt ambulated from chair to room door using walker on room air with assist from nurse tech. Tolerated well. Pt's O2 sat ranged from 89-90 % on RA during ambulation. Pt returned to bed, oxygen reapplied and bed alarm turned on. Pt's husband at bedside. Dagoberto Ligas, RN

## 2011-02-03 NOTE — Progress Notes (Signed)
Subjective: She feels better and less short of breath. No complaints of chest pain. She still gets a little winded with activity.  Objective: Vital signs in last 24 hours: Filed Vitals:   02/02/11 1500 02/02/11 1743 02/02/11 2035 02/03/11 0542  BP: 121/65  146/67 108/66  Pulse: 70  76 76  Temp: 97.8 F (36.6 C)  97.6 F (36.4 C) 98.5 F (36.9 C)  TempSrc:      Resp: 16  20 16   Height:      Weight:  82 kg (180 lb 12.4 oz)  81.103 kg (178 lb 12.8 oz)  SpO2: 98%  94% 91%   Weight change:  weight on admission 89.6 kg and today's weight 81.1 kg.  Intake/Output Summary (Last 24 hours) at 02/03/11 1540 Last data filed at 02/03/11 1300  Gross per 24 hour  Intake    720 ml  Output   2975 ml  Net  -2255 ml   Physical Exam: General: No acute distress. Lungs: Decreased at the bases no crackles or wheezing. Cardiovascular regular rate rhythm Abdomen soft nontender Extremities: Trace to 1+ pedal pitting edema  Lab Results: Basic Metabolic Panel:  Lab 02/03/11 7829 02/02/11 0521 01/31/11 0349 01/29/11 1643  NA 138 139 -- --  K 4.1 4.5 -- --  CL 93* 96 -- --  CO2 37* 35* -- --  GLUCOSE 221* 196* -- --  BUN 48* 70* -- --  CREATININE 1.17* 1.38* -- --  CALCIUM 10.4 9.8 -- --  MG -- -- -- 2.5  PHOS -- 4.1 7.4* --   Liver Function Tests:  Lab 01/29/11 1643  AST 33  ALT 31  ALKPHOS 80  BILITOT 0.5  PROT 7.6  ALBUMIN 3.4*   No results found for this basename: LIPASE:2,AMYLASE:2 in the last 168 hours No results found for this basename: AMMONIA:2 in the last 168 hours CBC:  Lab 02/03/11 0549 02/02/11 0521 01/29/11 1643  WBC 4.1 3.3* --  NEUTROABS -- -- 7.5  HGB 10.3* 9.9* --  HCT 31.3* 30.5* --  MCV 93.2 94.1 --  PLT 135* 147* --   Cardiac Enzymes:  Lab 01/30/11 0442 01/30/11 0027 01/29/11 1643  CKTOTAL 86 102 47  CKMB 2.7 2.9 2.9  CKMBINDEX -- -- --  TROPONINI <0.30 <0.30 <0.30   BNP:  Lab 01/29/11 1644  POCBNP 12246.0*   D-Dimer:  Lab 01/29/11 1643    DDIMER 6.13*   CBG:  Lab 02/03/11 1147 02/03/11 0724 02/02/11 2033 02/02/11 1648 02/02/11 1109 02/02/11 0727  GLUCAP 210* 208* 216* 241* 192* 169*   Hemoglobin A1C:  Lab 01/29/11 1643  HGBA1C 6.8*   Fasting Lipid Panel: No results found for this basename: CHOL,HDL,LDLCALC,TRIG,CHOLHDL,LDLDIRECT in the last 562 hours Thyroid Function Tests:  Lab 01/29/11 1643  TSH 0.980  T4TOTAL --  FREET4 --  T3FREE --  THYROIDAB --   Coagulation: No results found for this basename: LABPROT:4,INR:4 in the last 168 hours Anemia Panel:  Lab 01/30/11 0951 01/30/11 0441  VITAMINB12 -- 1274*  FOLATE -- >20.0  FERRITIN -- 258  TIBC -- 381  IRON -- 33*  RETICCTPCT 4.0* --   Urine Drug Screen: Drugs of Abuse  No results found for this basename: labopia,  cocainscrnur,  labbenz,  amphetmu,  thcu,  labbarb    Alcohol Level: No results found for this basename: ETH:2 in the last 168 hours Urinalysis: Results for LASHONDRA, VAQUERANO (MRN [id number]) as of 01/30/2011 08:29  Ref. Range 01/29/2011 21:50  Color, Urine  Latest Range: YELLOW  AMBER (A)  APPearance Latest Range: CLEAR  CLOUDY (A)  Specific Gravity, Urine Latest Range: 1.005-1.030  >1.030 (H)  pH Latest Range: 5.0-8.0  5.0  Glucose, UA Latest Range: NEGATIVE mg/dL NEGATIVE  Bilirubin Urine Latest Range: NEGATIVE  SMALL (A)  Ketones, ur Latest Range: NEGATIVE mg/dL NEGATIVE  Protein Latest Range: NEGATIVE mg/dL 409 (A)  Urobilinogen, UA Latest Range: 0.0-1.0 mg/dL 0.2  Nitrite Latest Range: NEGATIVE  NEGATIVE  Leukocytes, UA Latest Range: NEGATIVE  TRACE (A)  WBC, UA Latest Range: <3 WBC/hpf 7-10  RBC / HPF Latest Range: <3 RBC/hpf 21-50  Squamous Epithelial / LPF Latest Range: RARE  MANY (A)  Bacteria, UA Latest Range: RARE  MANY (A)    Micro Results: Recent Results (from the past 240 hour(s))  MRSA PCR SCREENING     Status: Normal   Collection Time   01/29/11  5:55 PM      Component Value Range Status Comment   MRSA by PCR  NEGATIVE  NEGATIVE  Final    Studies/Results: No results found. Scheduled Meds:    . aspirin  325 mg Oral Daily  . docusate sodium  100 mg Oral BID  . enoxaparin (LOVENOX) injection  40 mg Subcutaneous Daily  . furosemide  40 mg Oral BID  . gabapentin  300 mg Oral BID  . insulin aspart  0-9 Units Subcutaneous TID WC  . insulin glargine  10 Units Subcutaneous QHS  . polysaccharide iron  150 mg Oral Daily  . simvastatin  20 mg Oral QHS  . sodium chloride  10 mL Intracatheter Q12H  . DISCONTD: calcium acetate  667 mg Oral TID WC   Continuous Infusions:   PRN Meds:.acetaminophen, albuterol, guaiFENesin-dextromethorphan, HYDROmorphone, ondansetron (ZOFRAN) IV, ondansetron, senna-docusate, sodium chloride, traMADol  Assessment/Plan: Principal Problem:  *Systolic CHF, acute on chronic, compensated. Active Problems:  Bradycardia, improved  Hyperkalemia, resolved  ARF (acute renal failure), resolving  Essential hypertension, benign  Coronary atherosclerosis of native coronary artery  DM type 2 with diabetic peripheral neuropathy.  Anemia, probably iron deficiency post op, s/p transfusion of PRBC  HYPERLIPIDEMIA-MIXED  Hyperphosphatemia  Bifascicular block Thrombocytopenia Deconditioning.    The patient is symptomatically improved. She has diuresed multiple liters over the past several days. Her weight is down to 81.1 kg compared to 94 kg on day #2 and 89.6 kg at the time of admission. Her renal function has progressively improved.  She is deconditioned and I will encourage activity with the assistance of family and nursing.  We will check her oxygen saturations off of supplemental oxygen intermittently tomorrow to see if she may qualify for home oxygen or not.  I will increase Lantus insulin to 17 units from 10 units each bedtime given that her capillary blood glucose is trending up.    LOS: 5 days   [name] 02/03/2011, 3:40 PM

## 2011-02-04 LAB — BASIC METABOLIC PANEL
Chloride: 98 mEq/L (ref 96–112)
Creatinine, Ser: 1.05 mg/dL (ref 0.50–1.10)
GFR calc Af Amer: 65 mL/min — ABNORMAL LOW (ref >90)
GFR calc non Af Amer: 56 mL/min — ABNORMAL LOW (ref >90)
Potassium: 3.7 mEq/L (ref 3.5–5.1)

## 2011-02-04 LAB — CBC
Hemoglobin: 10.2 g/dL — ABNORMAL LOW (ref 12.0–15.0)
MCH: 30.9 pg (ref 26.0–34.0)
MCHC: 32.9 g/dL (ref 30.0–36.0)
MCV: 93.9 fL (ref 78.0–100.0)

## 2011-02-04 LAB — GLUCOSE, CAPILLARY
Glucose-Capillary: 173 mg/dL — ABNORMAL HIGH (ref 70–99)
Glucose-Capillary: 176 mg/dL — ABNORMAL HIGH (ref 70–99)
Glucose-Capillary: 220 mg/dL — ABNORMAL HIGH (ref 70–99)

## 2011-02-04 MED ORDER — SODIUM CHLORIDE 0.9 % IJ SOLN
INTRAMUSCULAR | Status: AC
  Administered 2011-02-04: 17:00:00
  Filled 2011-02-04: qty 3

## 2011-02-04 NOTE — Progress Notes (Signed)
Pt ambulated in hallway with front wheel walker and assistance from [place], [place]. Pt ambulated on RA. O2 sats ranged from 90-94 %. Tolerated well. Pt returned to bed. Oxygen left off at this time as pt's O2 saturation is 93 on RA. Will continue to monitor. Dagoberto Ligas, RN

## 2011-02-04 NOTE — Progress Notes (Signed)
Subjective: She feels better and less short of breath. She did see a little blood on the toilet paper when she had a bowel movement this morning. She says that she has hemorrhoids.  Objective: Vital signs in last 24 hours: Filed Vitals:   02/03/11 1400 02/03/11 1800 02/03/11 2054 02/04/11 0516  BP: 111/65  116/63 118/72  Pulse: 75  70 70  Temp: 98 F (36.7 C)  98.1 F (36.7 C) 97.8 F (36.6 C)  TempSrc:      Resp: 16  20 20   Height:      Weight: 79.833 kg (176 lb)   80.1 kg (176 lb 9.4 oz)  SpO2: 90% 89% 94% 97%   Weight change: -3.719 kg (-8 lb 3.2 oz) weight on admission 89.6 kg and today's weight 81.1 kg.  Intake/Output Summary (Last 24 hours) at 02/04/11 1302 Last data filed at 02/04/11 1200  Gross per 24 hour  Intake    250 ml  Output   2275 ml  Net  -2025 ml   Physical Exam: General: No acute distress. Lungs: Decreased at the bases no crackles or wheezing. Cardiovascular regular rate rhythm Abdomen soft nontender Extremities: Trace to 1+ pedal pitting edema Skin: No petechiae  Lab Results: Basic Metabolic Panel:  Lab 02/04/11 1610 02/03/11 0549 02/02/11 0521 01/31/11 0349 01/29/11 1643  NA 140 138 -- -- --  K 3.7 4.1 -- -- --  CL 98 93* -- -- --  CO2 35* 37* -- -- --  GLUCOSE 169* 221* -- -- --  BUN 36* 48* -- -- --  CREATININE 1.05 1.17* -- -- --  CALCIUM 9.4 10.4 -- -- --  MG -- -- -- -- 2.5  PHOS -- -- 4.1 7.4* --   Liver Function Tests:  Lab 01/29/11 1643  AST 33  ALT 31  ALKPHOS 80  BILITOT 0.5  PROT 7.6  ALBUMIN 3.4*   No results found for this basename: LIPASE:2,AMYLASE:2 in the last 168 hours No results found for this basename: AMMONIA:2 in the last 168 hours CBC:  Lab 02/04/11 0639 02/03/11 0549 01/29/11 1643  WBC 3.2* 4.1 --  NEUTROABS -- -- 7.5  HGB 10.2* 10.3* --  HCT 31.0* 31.3* --  MCV 93.9 93.2 --  PLT 119* 135* --   Cardiac Enzymes:  Lab 01/30/11 0442 01/30/11 0027 01/29/11 1643  CKTOTAL 86 102 47  CKMB 2.7 2.9 2.9    CKMBINDEX -- -- --  TROPONINI <0.30 <0.30 <0.30   BNP:  Lab 01/29/11 1644  POCBNP 12246.0*   D-Dimer:  Lab 01/29/11 1643  DDIMER 6.13*   CBG:  Lab 02/04/11 1112 02/04/11 0722 02/03/11 2053 02/03/11 1705 02/03/11 1147 02/03/11 0724  GLUCAP 176* 173* 234* 252* 210* 208*   Hemoglobin A1C:  Lab 01/29/11 1643  HGBA1C 6.8*   Fasting Lipid Panel: No results found for this basename: CHOL,HDL,LDLCALC,TRIG,CHOLHDL,LDLDIRECT in the last 960 hours Thyroid Function Tests:  Lab 01/29/11 1643  TSH 0.980  T4TOTAL --  FREET4 --  T3FREE --  THYROIDAB --   Coagulation: No results found for this basename: LABPROT:4,INR:4 in the last 168 hours Anemia Panel:  Lab 01/30/11 0951 01/30/11 0441  VITAMINB12 -- 1274*  FOLATE -- >20.0  FERRITIN -- 258  TIBC -- 381  IRON -- 33*  RETICCTPCT 4.0* --   Urine Drug Screen: Drugs of Abuse  No results found for this basename: labopia,  cocainscrnur,  labbenz,  amphetmu,  thcu,  labbarb    Alcohol Level: No results found  for this basename: ETH:2 in the last 168 hours Urinalysis: Results for JESSIKAH, DICKER (MRN [id number]) as of 01/30/2011 08:29  Ref. Range 01/29/2011 21:50  Color, Urine Latest Range: YELLOW  AMBER (A)  APPearance Latest Range: CLEAR  CLOUDY (A)  Specific Gravity, Urine Latest Range: 1.005-1.030  >1.030 (H)  pH Latest Range: 5.0-8.0  5.0  Glucose, UA Latest Range: NEGATIVE mg/dL NEGATIVE  Bilirubin Urine Latest Range: NEGATIVE  SMALL (A)  Ketones, ur Latest Range: NEGATIVE mg/dL NEGATIVE  Protein Latest Range: NEGATIVE mg/dL 962 (A)  Urobilinogen, UA Latest Range: 0.0-1.0 mg/dL 0.2  Nitrite Latest Range: NEGATIVE  NEGATIVE  Leukocytes, UA Latest Range: NEGATIVE  TRACE (A)  WBC, UA Latest Range: <3 WBC/hpf 7-10  RBC / HPF Latest Range: <3 RBC/hpf 21-50  Squamous Epithelial / LPF Latest Range: RARE  MANY (A)  Bacteria, UA Latest Range: RARE  MANY (A)    Micro Results: Recent Results (from the past 240 hour(s))   MRSA PCR SCREENING     Status: Normal   Collection Time   01/29/11  5:55 PM      Component Value Range Status Comment   MRSA by PCR NEGATIVE  NEGATIVE  Final    Studies/Results: No results found. Scheduled Meds:    . aspirin  325 mg Oral Daily  . docusate sodium  100 mg Oral BID  . furosemide  40 mg Oral BID  . gabapentin  300 mg Oral BID  . insulin aspart  0-9 Units Subcutaneous TID WC  . insulin glargine  17 Units Subcutaneous QHS  . polysaccharide iron  150 mg Oral Daily  . simvastatin  20 mg Oral QHS  . sodium chloride  10 mL Intracatheter Q12H  . DISCONTD: enoxaparin (LOVENOX) injection  40 mg Subcutaneous Daily  . DISCONTD: insulin glargine  10 Units Subcutaneous QHS   Continuous Infusions:   PRN Meds:.acetaminophen, albuterol, guaiFENesin-dextromethorphan, HYDROmorphone, ondansetron (ZOFRAN) IV, ondansetron, senna-docusate, sodium chloride, traMADol  Assessment/Plan: Principal Problem:  *Systolic CHF, acute on chronic. Active Problems:  Bradycardia, improved  Hyperkalemia, resolved  ARF (acute renal failure), resolving  Essential hypertension, benign  Coronary atherosclerosis of native coronary artery  DM type 2 with diabetic peripheral neuropathy.  Anemia, probably iron deficiency post op, s/p transfusion of PRBC  HYPERLIPIDEMIA-MIXED  Hyperphosphatemia  Bifascicular block Thrombocytopenia Deconditioning.    Thrombocytopenia: The patient's platelet count has progressively decreased. It was 255 on admission. It is now 119. Etiology is unclear. Doubt DIC as there is no sign of infection. She has been receiving prophylactic Lovenox and she is on aspirin chronically. I will discontinue the Lovenox and order a HIT panel. We'll also order a blood smear for the pathologist to read. Will monitor her platelet count daily until it stabilizes. We'll consider hematology consultation.  Her CHF exacerbation is symptomatically improved. She has diuresed multiple liters over  the past several days. Her weight is down to 80.1 kg compared to 94 kg on day #2 and 89.6 kg at the time of admission. Her renal function has progressively improved.  She is deconditioned and I will encourage activity with the assistance of family and nursing.  Her oxygen saturations have improved but they are still borderline low ranging from 89-90% on room air. I do not believe she qualifies for home oxygen.  Her renal function is stable. Her capillary blood glucose is improving on the increase in insulin.    LOS: 6 days   Karia Ehresman 02/04/2011, 1:02 PM

## 2011-02-05 ENCOUNTER — Inpatient Hospital Stay (HOSPITAL_COMMUNITY): Payer: Managed Care, Other (non HMO)

## 2011-02-05 DIAGNOSIS — I10 Essential (primary) hypertension: Secondary | ICD-10-CM

## 2011-02-05 LAB — CBC
HCT: 31.2 % — ABNORMAL LOW (ref 36.0–46.0)
Hemoglobin: 10.4 g/dL — ABNORMAL LOW (ref 12.0–15.0)
MCH: 30.6 pg (ref 26.0–34.0)
MCHC: 33.3 g/dL (ref 30.0–36.0)
MCV: 91.8 fL (ref 78.0–100.0)
Platelets: 114 10*3/uL — ABNORMAL LOW (ref 150–400)
RBC: 3.4 MIL/uL — ABNORMAL LOW (ref 3.87–5.11)
RDW: 14.1 % (ref 11.5–15.5)
WBC: 4.3 10*3/uL (ref 4.0–10.5)

## 2011-02-05 LAB — BASIC METABOLIC PANEL
BUN: 34 mg/dL — ABNORMAL HIGH (ref 6–23)
CO2: 35 mEq/L — ABNORMAL HIGH (ref 19–32)
Chloride: 95 mEq/L — ABNORMAL LOW (ref 96–112)
Creatinine, Ser: 1.17 mg/dL — ABNORMAL HIGH (ref 0.50–1.10)
Glucose, Bld: 226 mg/dL — ABNORMAL HIGH (ref 70–99)

## 2011-02-05 LAB — DIFFERENTIAL
Basophils Absolute: 0 10*3/uL (ref 0.0–0.1)
Basophils Relative: 0 % (ref 0–1)
Eosinophils Relative: 5 % (ref 0–5)
Lymphocytes Relative: 31 % (ref 12–46)
Monocytes Absolute: 0.4 10*3/uL (ref 0.1–1.0)

## 2011-02-05 LAB — GLUCOSE, CAPILLARY
Glucose-Capillary: 196 mg/dL — ABNORMAL HIGH (ref 70–99)
Glucose-Capillary: 247 mg/dL — ABNORMAL HIGH (ref 70–99)
Glucose-Capillary: 184 mg/dL — ABNORMAL HIGH (ref 70–99)
Glucose-Capillary: 284 mg/dL — ABNORMAL HIGH (ref 70–99)

## 2011-02-05 MED ORDER — SODIUM CHLORIDE 0.9 % IJ SOLN
INTRAMUSCULAR | Status: AC
  Filled 2011-02-05: qty 3

## 2011-02-05 MED ORDER — INSULIN GLARGINE 100 UNIT/ML ~~LOC~~ SOLN
25.0000 [IU] | Freq: Every day | SUBCUTANEOUS | Status: DC
  Administered 2011-02-05: 25 [IU] via SUBCUTANEOUS

## 2011-02-05 MED ORDER — METFORMIN HCL 500 MG PO TABS
500.0000 mg | ORAL_TABLET | Freq: Two times a day (BID) | ORAL | Status: DC
  Administered 2011-02-05 – 2011-02-06 (×3): 500 mg via ORAL
  Filled 2011-02-05 (×3): qty 1

## 2011-02-05 NOTE — Progress Notes (Addendum)
SUBJECTIVE:Tired but feeling a lot better. Has been up walking in the halls. Anxious now to return home.  Principal Problem:  *Systolic CHF, acute on chronic Active Problems:  Coronary atherosclerosis of native coronary artery  Essential hypertension, benign  HYPERLIPIDEMIA-MIXED  Thrombocytopenia  DM type 2 with diabetic peripheral neuropathy  Bradycardia  Hyperkalemia  Hyperphosphatemia  ARF (acute renal failure)  Bifascicular block  Anemia  Fatty liver   LABS: Basic Metabolic Panel:  Basename 02/05/11 0534 02/04/11 0636  NA 137 140  K 3.9 3.7  CL 95* 98  CO2 35* 35*  GLUCOSE 226* 169*  BUN 34* 36*  CREATININE 1.17* 1.05  CALCIUM 9.4 9.4  MG -- --  PHOS -- --   CBC:  Basename 02/05/11 0534 02/04/11 0639  WBC 4.3 3.2*  NEUTROABS 2.4 --  HGB 10.4* 10.2*  HCT 31.2* 31.0*  MCV 91.8 93.9  PLT 114* 119*     PHYSICAL EXAM BP 124/73  Pulse 83  Temp(Src) 98.1 F (36.7 C) (Oral)  Resp 20  Ht 5\' 5"  (1.651 m)  Wt 175 lb 7.8 oz (79.6 kg)  BMI 29.20 kg/m2  SpO2 90%  LMP 01/05/1996 (Admission wt 208 lbs) Total wt loss: 33 lbs. General: Well developed, well nourished, in no acute distress Head: Eyes PERRLA, No xanthomas.   Normal cephalic and atramatic  Lungs: Clear bilaterally to auscultation and percussion. Heart: HRRR S1 S2, No MRG .  Pulses are 2+ & equal.            No carotid bruit. No JVD.  No abdominal bruits. No femoral bruits. Abdomen: Bowel sounds are positive, abdomen soft and non-tender without masses or                  Hernia's noted. Msk:  Back normal, normal gait. Diminished strength and tone for age. Extremities: No clubbing, cyanosis or edema.  DP +1 Neuro: Alert and oriented X 3. Psych:  Good affect, responds appropriately  TELEMETRY: Reviewed telemetry pt in: NSR rates in the 70's-80's  ASSESSMENT AND PLAN: 1. Acute on Chronic Systolic Heart Failure: She is substantially improved with excellent diuresis. Her wt     is down 33 lbs from  admission. Breathing status is significantly improved. BP well controlled. No              evidence of fluid overload at this time.  2. Deconditioning: Improving slowly. She has been up walking in the halls a couple of times a day over          the weekend. 3. Bradycardia:  Resolved. She is on now rate control medication at this time.No evidence of recurrent        afib as during prior hospitalization for CABG. Consider low dose BB for CAD at later date when she is back to baseline concerning activity. BP is controlled without medications as well.  4. Thrombocytopenia: HIT profile is pending.  Lovenox has been d/c'd.     Bettey Mare. Lyman Bishop NP [facility] 02/05/2011, 8:21 AM    Attending note:  Patient seen and examined with Ms. Lawrence whose findings are outlined above. She has had a substantial diuresis during hospitalization, feels much better. She is improving with PT/OT, eager to go home. Followup lab work shows BUN 34, creatinine 1.1, hemoglobin stable at 10.4. She has had a gradual drift down in platelet count, now 114, has been taken off of Lovenox, and has pending consultation with Hematology.  She has been  hemodynamically stable with normal heart rates. Current medications include aspirin, Lasix, and Zocor. She is on no rate lowering medications, and amiodarone has been discontinued. No intercurrent atrial fibrillation noted.  Perhaps later as an outpatient, we may be able to consider using a low-dose ACE inhibitor in light of her reduced LVEF, presuming renal function remains normal. I am hesitant to add any rate lowering medicines, even low-dose Coreg, in light of her presentation.  Jonelle Sidle, M.D., F.A.C.C.

## 2011-02-05 NOTE — Progress Notes (Signed)
Inpatient Diabetes Program Recommendations  AACE/ADA: New Consensus Statement on Inpatient Glycemic Control (2009)  Target Ranges:  Prepandial:   less than 140 mg/dL      Peak postprandial:   less than 180 mg/dL (1-2 hours)      Critically ill patients:  140 - 180 mg/dL   Reason for Visit: Elevated glucose:  226 mg/dL this am and elevated prandial glucose 12/9  Inpatient Diabetes Program Recommendations Insulin - Basal: Increase Lantus to 22 units qhs Insulin - Meal Coverage: Add Novolog 2 units TID for meal coverage

## 2011-02-05 NOTE — Progress Notes (Signed)
Subjective: She has no complaints of chest pain or shortness of breath.  Objective: Vital signs in last 24 hours: Filed Vitals:   02/04/11 1423 02/04/11 1800 02/04/11 2106 02/05/11 0512  BP: 122/68  127/74 124/73  Pulse: 81  88 83  Temp: 98.3 F (36.8 C)  99.1 F (37.3 C) 98.1 F (36.7 C)  TempSrc:      Resp: 18  20 20   Height:      Weight:    79.6 kg (175 lb 7.8 oz)  SpO2: 91% 92% 91% 90%   Weight change: -0.233 kg (-8.2 oz) weight on admission 89.6 kg and today's weight 81.1 kg.  Intake/Output Summary (Last 24 hours) at 02/05/11 1136 Last data filed at 02/05/11 0104  Gross per 24 hour  Intake    490 ml  Output   1725 ml  Net  -1235 ml   Physical Exam: General: No acute distress. Lungs: Decreased at the bases no crackles or wheezing. Cardiovascular regular rate rhythm Abdomen soft nontender Extremities: Trace to 1+ pedal pitting edema Skin: No petechiae  Lab Results: Basic Metabolic Panel:  Lab 02/05/11 4098 02/04/11 0636 02/02/11 0521 01/31/11 0349 01/29/11 1643  NA 137 140 -- -- --  K 3.9 3.7 -- -- --  CL 95* 98 -- -- --  CO2 35* 35* -- -- --  GLUCOSE 226* 169* -- -- --  BUN 34* 36* -- -- --  CREATININE 1.17* 1.05 -- -- --  CALCIUM 9.4 9.4 -- -- --  MG -- -- -- -- 2.5  PHOS -- -- 4.1 7.4* --   Liver Function Tests:  Lab 01/29/11 1643  AST 33  ALT 31  ALKPHOS 80  BILITOT 0.5  PROT 7.6  ALBUMIN 3.4*   No results found for this basename: LIPASE:2,AMYLASE:2 in the last 168 hours No results found for this basename: AMMONIA:2 in the last 168 hours CBC:  Lab 02/05/11 0534 02/04/11 0639 01/29/11 1643  WBC 4.3 3.2* --  NEUTROABS 2.4 -- 7.5  HGB 10.4* 10.2* --  HCT 31.2* 31.0* --  MCV 91.8 93.9 --  PLT 114* 119* --   Cardiac Enzymes:  Lab 01/30/11 0442 01/30/11 0027 01/29/11 1643  CKTOTAL 86 102 47  CKMB 2.7 2.9 2.9  CKMBINDEX -- -- --  TROPONINI <0.30 <0.30 <0.30   BNP: No components found with this basename: POCBNP:3 D-Dimer:  Lab  01/29/11 1643  DDIMER 6.13*   CBG:  Lab 02/05/11 0729 02/04/11 2105 02/04/11 1653 02/04/11 1112 02/04/11 0722 02/03/11 2053  GLUCAP 196* 220* 192* 176* 173* 234*   Hemoglobin A1C:  Lab 01/29/11 1643  HGBA1C 6.8*   Fasting Lipid Panel: No results found for this basename: CHOL,HDL,LDLCALC,TRIG,CHOLHDL,LDLDIRECT in the last 119 hours Thyroid Function Tests:  Lab 01/29/11 1643  TSH 0.980  T4TOTAL --  FREET4 --  T3FREE --  THYROIDAB --   Coagulation: No results found for this basename: LABPROT:4,INR:4 in the last 168 hours Anemia Panel:  Lab 01/30/11 0951 01/30/11 0441  VITAMINB12 -- 1274*  FOLATE -- >20.0  FERRITIN -- 258  TIBC -- 381  IRON -- 33*  RETICCTPCT 4.0* --   Urine Drug Screen: Drugs of Abuse  No results found for this basename: labopia,  cocainscrnur,  labbenz,  amphetmu,  thcu,  labbarb    Alcohol Level: No results found for this basename: ETH:2 in the last 168 hours Urinalysis: Results for [name], [name] (MRN [id number]) as of 01/30/2011 08:29  Ref. Range 01/29/2011 21:50  Color, Urine  Latest Range: YELLOW  AMBER (A)  APPearance Latest Range: CLEAR  CLOUDY (A)  Specific Gravity, Urine Latest Range: 1.005-1.030  >1.030 (H)  pH Latest Range: 5.0-8.0  5.0  Glucose, UA Latest Range: NEGATIVE mg/dL NEGATIVE  Bilirubin Urine Latest Range: NEGATIVE  SMALL (A)  Ketones, ur Latest Range: NEGATIVE mg/dL NEGATIVE  Protein Latest Range: NEGATIVE mg/dL 161 (A)  Urobilinogen, UA Latest Range: 0.0-1.0 mg/dL 0.2  Nitrite Latest Range: NEGATIVE  NEGATIVE  Leukocytes, UA Latest Range: NEGATIVE  TRACE (A)  WBC, UA Latest Range: <3 WBC/hpf 7-10  RBC / HPF Latest Range: <3 RBC/hpf 21-50  Squamous Epithelial / LPF Latest Range: RARE  MANY (A)  Bacteria, UA Latest Range: RARE  MANY (A)    Micro Results: Recent Results (from the past 240 hour(s))  MRSA PCR SCREENING     Status: Normal   Collection Time   01/29/11  5:55 PM      Component Value Range Status Comment    MRSA by PCR NEGATIVE  NEGATIVE  Final    Studies/Results: No results found. Scheduled Meds:    . aspirin  325 mg Oral Daily  . docusate sodium  100 mg Oral BID  . furosemide  40 mg Oral BID  . gabapentin  300 mg Oral BID  . insulin aspart  0-9 Units Subcutaneous TID WC  . insulin glargine  25 Units Subcutaneous QHS  . metFORMIN  500 mg Oral BID WC  . polysaccharide iron  150 mg Oral Daily  . simvastatin  20 mg Oral QHS  . sodium chloride  10 mL Intracatheter Q12H  . sodium chloride      . DISCONTD: insulin glargine  17 Units Subcutaneous QHS   Continuous Infusions:   PRN Meds:.acetaminophen, albuterol, guaiFENesin-dextromethorphan, HYDROmorphone, ondansetron (ZOFRAN) IV, ondansetron, senna-docusate, sodium chloride, traMADol  Assessment/Plan: Principal Problem:  *Systolic CHF, acute on chronic. Active Problems:  Bradycardia, improved  Hyperkalemia, resolved  ARF (acute renal failure), resolving  Essential hypertension, benign  Coronary atherosclerosis of native coronary artery  DM type 2 with diabetic peripheral neuropathy.  Anemia, probably iron deficiency post op, s/p transfusion of PRBC  HYPERLIPIDEMIA-MIXED  Hyperphosphatemia  Bifascicular block Thrombocytopenia Deconditioning.    Thrombocytopenia: The patient's platelet count has progressively decreased. It was 255 on admission. It is now 114. Etiology is unclear. Doubt DIC as there is no sign of infection. She had been receiving prophylactic Lovenox and she is on aspirin chronically. Lovenox was discontinued. She remains on heparin. The results of the HIT panel are pending. Hematology has been consulted. A blood smear was obtained yesterday for review. No active bleeding noted.  Chronic anemia, likely secondary to perioperative blood loss during the recent CABG. She was transfused multiple units of packed red blood cells during a previous hospitalization. She was transfused one unit during this hospitalization. Her  hemoglobin has been stable over the past couple days.  Her CHF exacerbation is symptomatically improved. She has diuresed multiple liters over the past several days. Her weight is down to 79.6 kg compared to 94 kg on day #2 and 89.6 kg at the time of admission. Her renal function has progressively improved.  Holding metoprolol and lisinopril per cardiology. Amiodarone has been discontinued due to bradycardia.  Type 2 diabetes mellitus. The diabetes coordinator recommendations are noted. However, she has been off of metformin and therefore it will be restarted. Will increase Lantus to 25 units each bedtime.  She is deconditioned; she has been encouraged to increase activity with  the assistance of family and nursing.  Her oxygen saturations have improved but they are still borderline low ranging from 89-90% on room air. I do not believe she qualifies for home oxygen.  Her renal function is stable.    LOS: 7 days   [name] 02/05/2011, 11:36 AM

## 2011-02-05 NOTE — Progress Notes (Signed)
Physical Therapy Treatment Patient Details Name: [name] MRN: [id number] DOB: 1950-06-05 Today's Date: 02/05/2011  TIME: 1122-1145/14mins GT-2mins TE   PT Assessment/Plan  PT - Assessment/Plan Comments on Treatment Session: Pt tolerated all therapy well today;needed only one rest break after ambulating 110' before beginnng chair exercises;pt ablet to use BSC with supervision only PT Goals  Acute Rehab PT Goals PT Goal: Rolling Supine to Left Side - Progress: Met PT Goal: Supine/Side to Sit - Progress: Progressing toward goal PT Goal: Sit to Stand - Progress: Met PT Goal: Stand to Sit - Progress: Met PT Goal: Ambulate - Progress: Progressing toward goal  PT Treatment Precautions/Restrictions  Precautions Precautions: Sternal;Fall Restrictions Weight Bearing Restrictions: No Mobility (including Balance) Bed Mobility Sit to Supine - Left: 6: Modified independent (Device/Increase time) Sit to Supine - Left Details (indicate cue type and reason): HOB elevated 65 degrees Transfers Transfers: Yes Sit to Stand: 5: Supervision Stand to Sit: 5: Supervision Ambulation/Gait Ambulation/Gait: Yes Ambulation/Gait Assistance: 5: Supervision Ambulation/Gait Assistance Details (indicate cue type and reason): verbal cues for thoracic extension Ambulation Distance (Feet): 110 Feet Assistive device: Rolling walker Gait Pattern: Trunk flexed Gait velocity: very slow Stairs: No Wheelchair Mobility Wheelchair Mobility: No    Exercise  General Exercises - Upper Extremity Shoulder Flexion: Both;10 reps Shoulder Horizontal ABduction: Both;10 reps General Exercises - Lower Extremity Long Arc Quad: Both;20 reps Toe Raises: Both;20 reps Heel Raises: Both (20 reps) End of Session PT - End of Session Equipment Utilized During Treatment: Gait belt Activity Tolerance: Patient tolerated treatment well Patient left: in chair;with call bell in reach;with family/visitor present (husband  present;nursing aware of no chair alarm ) Nurse Communication: Mobility status for transfers General Behavior During Session: [facility] for tasks performed Cognition: [facility] for tasks performed  Franciscojavier Wronski ATKINSO 02/05/2011, 12:01 PM

## 2011-02-06 ENCOUNTER — Other Ambulatory Visit (HOSPITAL_COMMUNITY): Payer: Self-pay | Admitting: Oncology

## 2011-02-06 ENCOUNTER — Ambulatory Visit: Payer: Medicare HMO | Admitting: Cardiology

## 2011-02-06 ENCOUNTER — Encounter (HOSPITAL_COMMUNITY): Payer: Self-pay | Admitting: Internal Medicine

## 2011-02-06 DIAGNOSIS — D696 Thrombocytopenia, unspecified: Secondary | ICD-10-CM

## 2011-02-06 DIAGNOSIS — R161 Splenomegaly, not elsewhere classified: Secondary | ICD-10-CM | POA: Diagnosis present

## 2011-02-06 DIAGNOSIS — I5023 Acute on chronic systolic (congestive) heart failure: Principal | ICD-10-CM

## 2011-02-06 DIAGNOSIS — IMO0001 Reserved for inherently not codable concepts without codable children: Secondary | ICD-10-CM

## 2011-02-06 DIAGNOSIS — D649 Anemia, unspecified: Secondary | ICD-10-CM

## 2011-02-06 LAB — CBC
MCV: 91 fL (ref 78.0–100.0)
Platelets: 102 10*3/uL — ABNORMAL LOW (ref 150–400)
RBC: 3.44 MIL/uL — ABNORMAL LOW (ref 3.87–5.11)
WBC: 4.7 10*3/uL (ref 4.0–10.5)

## 2011-02-06 LAB — BASIC METABOLIC PANEL
CO2: 34 mEq/L — ABNORMAL HIGH (ref 19–32)
Calcium: 9.2 mg/dL (ref 8.4–10.5)
GFR calc Af Amer: 64 mL/min — ABNORMAL LOW (ref >90)
Sodium: 135 mEq/L (ref 135–145)

## 2011-02-06 LAB — GLUCOSE, CAPILLARY: Glucose-Capillary: 195 mg/dL — ABNORMAL HIGH (ref 70–99)

## 2011-02-06 LAB — PATHOLOGIST SMEAR REVIEW

## 2011-02-06 MED ORDER — ONE-DAILY MULTI VITAMINS PO TABS: 1.0000 | ORAL_TABLET | Freq: Every day | ORAL | Status: DC

## 2011-02-06 MED ORDER — FUROSEMIDE 40 MG PO TABS
40.0000 mg | ORAL_TABLET | Freq: Every day | ORAL | Status: DC
  Filled 2011-02-06: qty 1

## 2011-02-06 NOTE — Consult Note (Signed)
[facility] Consultation Oncology  Name: [name]      MRN: [id number]    Location: A307/A307-01  Date: 02/06/2011 Time:9:47 AM   REFERRING PHYSICIAN:  Elliot Cousin  REASON FOR CONSULT:  Thrombocytopenia   DIAGNOSIS:  Thrombocytopenia with splenomegaly  HISTORY OF PRESENT ILLNESS:    This is a 60 year old Caucasian woman who has a medical history significant for acute on chronic systolic congestive heart failure, history of bradycardia, history of acute renal failure, type 2 diabetes, anemia, history of right sided breast cancer, and fatty infiltration of liver who was directly admitted to the hospital from her cardiologist office do to bradycardia and congestive heart failure exacerbation. Patient is status post cardiac bypass graft at [place] approximately 1 month ago. It is noted that the patient reports that she received approximately 5 units of blood products at [facility] where she was admitted for her cardiac bypass graft last month.  The patient denies ever being told that she had low platelets, but she does admit to being anemic for over 1 year.  She is on OTC iron pills.  She does report that she knew she had a fatty liver.  She reports knowing that for nearly 15 years.  She denies any hepatitis or HIV risk factors.   Interesting, the patient's platelet count was 333 (WNL) on admission to the [facility] on 01/29/2011.  Unfortunately, her count is noted to decline daily (333--- 255---195--- 147--- 135--- 119--- 114--- 102).  Her LMWH was discontinued yesterday.  No other obvious medication cause for this thrombocytopenia.   Approximately 5 years ago, a CT of abdomen revealed a generously sized spleen.  Therefore, this study was repeated yesterday and reveals splenomegaly, but not significantly larger than study 5 years ago.  The patient reports chronically low abdominal pain with no known etiology.  She denies RUQ and LUQ discomfort or fullness.  She  denies any fevers, chills, and sweats.  She denies any bleeding.  She denies any headaches, dizziness, double vision.  She is moving her bowels appropriately with no blood or black tarry stool.  She denies any hematuria or urinary complaints.   The patient has a history of breast cancer for which she underwent a right  Mastectomy, chemotherapy, and tamoxifen endocrine therapy (she reports she took this medication during chemotherapy for 6 months only).  She was diagnosed at the age of 64.  Patient complains of a sore throat starting 1 day ago.  PE is negative.  No erythema noted.   PAST MEDICAL HISTORY:   Past Medical History  Diagnosis Date  . Coronary atherosclerosis of native coronary artery     BMS circ 2001, DES LAD 2005, DES LAD and RCA 2008  . Drug allergy     Allergy to Plavix (hives) - took Ticlid in past  . Essential hypertension, benign   . Mixed hyperlipidemia   . Type 2 diabetes mellitus   . Breast cancer     Chemo and mastectomy  . Angina   . Myocardial infarction ~ 2002  . Diabetes mellitus   . Shortness of breath 01/05/11     "w/exertion"  . Arthritis     rheumatoid  . Depression   . Peripheral neuropathy     "from diabetes"  . Peptic ulcer disease ~ 1975  . Cirrhosis 02/06/2011    PER CT  . Splenomegaly 02/06/2011    PER CT    ALLERGIES: Allergies  Allergen Reactions  .  Clopidogrel Bisulfate Hives  . Codeine Nausea And Vomiting  . Morphine Nausea And Vomiting      MEDICATIONS: I have reviewed the patient's current medications.     PAST SURGICAL HISTORY Past Surgical History  Procedure Date  . Right mastectomy   . Mastectomy 1988    right  . Cholecystectomy 1969  . Abdominal hysterectomy 1989  . Tonsillectomy and adenoidectomy 1979  . Appendectomy 1969  . Tubal ligation 1972  . Coronary angioplasty with stent placement 4 Times    "had 2 stents each time; last time 2010"  . Cardiac catheterization 01/05/11    "no stents"  . Coronary artery  bypass graft 01/11/2011    Procedure: CORONARY ARTERY BYPASS GRAFTING (CABG);  Surgeon: Kathlee Nations Suann Larry, MD;  Location: [facility] OR;  Service: Open Heart Surgery;  Laterality: N/A;  cabg x four, using left internal mammary artery and right leg greater saphenous vein harvestede endoscopically    FAMILY HISTORY: No family history on file.  SOCIAL HISTORY:  reports that she quit smoking about 3 years ago. Her smoking use included Cigarettes. She has a 20 pack-year smoking history. She has never used smokeless tobacco. She reports that she does not drink alcohol or use illicit drugs.  PERFORMANCE STATUS: The patient's performance status is 1 - Symptomatic but completely ambulatory  PHYSICAL EXAM: Most Recent Vital Signs: Blood pressure 119/69, pulse 90, temperature 99.1 F (37.3 C), temperature source Oral, resp. rate 18, height 5\' 5"  (1.651 m), weight 78.7 kg (173 lb 8 oz), last menstrual period 01/05/1996, SpO2 91.00%. General appearance: alert, cooperative, appears older than stated age and no distress Neck: supple, symmetrical, trachea midline Back: symmetric, no curvature. ROM normal. No CVA tenderness. Lungs: clear to auscultation bilaterally Heart: regular rate and rhythm, S1, S2 normal, no murmur, click, rub or gallop and midline surgical site over sternum healing without signs of infection Abdomen: abnormal findings:  mild tenderness in the LUQ and in the lower abdomen Extremities: edema 1+ B/L pitting edema in LE inferior to knee Skin: Skin color, texture, turgor normal. No rashes or lesions Neurologic: Grossly normal  LABORATORY DATA:  Results for orders placed during the hospital encounter of 01/29/11 (from the past 48 hour(s))  GLUCOSE, CAPILLARY     Status: Abnormal   Collection Time   02/04/11 11:12 AM      Component Value Range Comment   Glucose-Capillary 176 (*) 70 - 99 (mg/dL)    Comment 1 Notify RN     GLUCOSE, CAPILLARY     Status: Abnormal   Collection Time   02/04/11   4:53 PM      Component Value Range Comment   Glucose-Capillary 192 (*) 70 - 99 (mg/dL)    Comment 1 Notify RN      Comment 2 Documented in Chart     GLUCOSE, CAPILLARY     Status: Abnormal   Collection Time   02/04/11  9:05 PM      Component Value Range Comment   Glucose-Capillary 220 (*) 70 - 99 (mg/dL)    Comment 1 Notify RN     BASIC METABOLIC PANEL     Status: Abnormal   Collection Time   02/05/11  5:34 AM      Component Value Range Comment   Sodium 137  135 - 145 (mEq/L)    Potassium 3.9  3.5 - 5.1 (mEq/L)    Chloride 95 (*) 96 - 112 (mEq/L)    CO2 35 (*) 19 - 32 (  mEq/L)    Glucose, Bld 226 (*) 70 - 99 (mg/dL)    BUN 34 (*) 6 - 23 (mg/dL)    Creatinine, Ser 7.82 (*) 0.50 - 1.10 (mg/dL)    Calcium 9.4  8.4 - 10.5 (mg/dL)    GFR calc non Af Amer 49 (*) >90 (mL/min)    GFR calc Af Amer 57 (*) >90 (mL/min)   CBC     Status: Abnormal   Collection Time   02/05/11  5:34 AM      Component Value Range Comment   WBC 4.3  4.0 - 10.5 (K/uL)    RBC 3.40 (*) 3.87 - 5.11 (MIL/uL)    Hemoglobin 10.4 (*) 12.0 - 15.0 (g/dL)    HCT 95.6 (*) 21.3 - 46.0 (%)    MCV 91.8  78.0 - 100.0 (fL)    MCH 30.6  26.0 - 34.0 (pg)    MCHC 33.3  30.0 - 36.0 (g/dL)    RDW 08.6  57.8 - 46.9 (%)    Platelets 114 (*) 150 - 400 (K/uL)   DIFFERENTIAL     Status: Normal   Collection Time   02/05/11  5:34 AM      Component Value Range Comment   Neutrophils Relative 56  43 - 77 (%)    Neutro Abs 2.4  1.7 - 7.7 (K/uL)    Lymphocytes Relative 31  12 - 46 (%)    Lymphs Abs 1.3  0.7 - 4.0 (K/uL)    Monocytes Relative 8  3 - 12 (%)    Monocytes Absolute 0.4  0.1 - 1.0 (K/uL)    Eosinophils Relative 5  0 - 5 (%)    Eosinophils Absolute 0.2  0.0 - 0.7 (K/uL)    Basophils Relative 0  0 - 1 (%)    Basophils Absolute 0.0  0.0 - 0.1 (K/uL)   GLUCOSE, CAPILLARY     Status: Abnormal   Collection Time   02/05/11  7:29 AM      Component Value Range Comment   Glucose-Capillary 196 (*) 70 - 99 (mg/dL)   GLUCOSE,  CAPILLARY     Status: Abnormal   Collection Time   02/05/11 11:48 AM      Component Value Range Comment   Glucose-Capillary 247 (*) 70 - 99 (mg/dL)   GLUCOSE, CAPILLARY     Status: Abnormal   Collection Time   02/05/11  5:02 PM      Component Value Range Comment   Glucose-Capillary 184 (*) 70 - 99 (mg/dL)   GLUCOSE, CAPILLARY     Status: Abnormal   Collection Time   02/05/11  8:45 PM      Component Value Range Comment   Glucose-Capillary 284 (*) 70 - 99 (mg/dL)    Comment 1 Notify RN     BASIC METABOLIC PANEL     Status: Abnormal   Collection Time   02/06/11  4:47 AM      Component Value Range Comment   Sodium 135  135 - 145 (mEq/L)    Potassium 3.6  3.5 - 5.1 (mEq/L)    Chloride 93 (*) 96 - 112 (mEq/L)    CO2 34 (*) 19 - 32 (mEq/L)    Glucose, Bld 203 (*) 70 - 99 (mg/dL)    BUN 28 (*) 6 - 23 (mg/dL)    Creatinine, Ser 6.29  0.50 - 1.10 (mg/dL)    Calcium 9.2  8.4 - 10.5 (mg/dL)    GFR calc non Af Denyse Dago  55 (*) >90 (mL/min)    GFR calc Af Amer 64 (*) >90 (mL/min)   CBC     Status: Abnormal   Collection Time   02/06/11  4:47 AM      Component Value Range Comment   WBC 4.7  4.0 - 10.5 (K/uL)    RBC 3.44 (*) 3.87 - 5.11 (MIL/uL)    Hemoglobin 10.5 (*) 12.0 - 15.0 (g/dL)    HCT 16.1 (*) 09.6 - 46.0 (%)    MCV 91.0  78.0 - 100.0 (fL)    MCH 30.5  26.0 - 34.0 (pg)    MCHC 33.5  30.0 - 36.0 (g/dL)    RDW 04.5  40.9 - 81.1 (%)    Platelets 102 (*) 150 - 400 (K/uL)   GLUCOSE, CAPILLARY     Status: Abnormal   Collection Time   02/06/11  7:31 AM      Component Value Range Comment   Glucose-Capillary 195 (*) 70 - 99 (mg/dL)       RADIOGRAPHY: Ct Abdomen Pelvis Wo Contrast  02/05/2011  *RADIOLOGY REPORT*  Clinical Data: Evaluate for splenomegaly.  Thrombocytopenia. Breast cancer.  Diabetic.  Hypertension.  Post hysterectomy and appendectomy.  CT ABDOMEN AND PELVIS WITHOUT CONTRAST  Technique:  Multidetector CT imaging of the abdomen and pelvis was performed following the  standard protocol without intravenous contrast.  Comparison: 01/31/2011 renal sonogram.  02/08/2007CT.  Findings: Small left-sided pleural effusion with basilar atelectasis/subtle infiltrate.  Cirrhotic appearance of the liver.  Liver appears dense. Unenhanced imaging without focal liver lesion.  The spleen is enlarged and measures 11.8 cm in length with transverse dimension of 14.9 x 5.9 cm consistent with a splenic volume of 519 ml (upper range normal 412 ml).  Prominent coronary artery calcifications.  Mild cardiomegaly. Trace to small pericardial effusion.  Scattered colonic diverticula.  No extraluminal bowel inflammatory process, free air or free fluid.  Atherosclerotic type changes of the aorta with mild ectasia. Atherosclerotic type changes of the aortic branch vessels including iliac arteries.  Partially decompressed urinary bladder with circumferential thickening of the bladder wall without obvious mass.  Left upper pole nonobstructing 2.6 cm stone approaching a partial staghorn calculus appearance.  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  There is mild fullness of the right renal collecting system with mild infiltration of the fat planes along the lower right kidney and right ureter.  No calcified renal stone seen along the course of the right ureter.  This slight collecting system fullness could be explained by a noncalcified stone, blood clot, tumor or recently passed stone.  No focal pancreatic or adrenal lesion noted on this unenhanced exam.  Scattered normal sized lymph nodes upper abdomen and retroperitoneal region.  Elongated and portacaval leads.  No bony destructive lesion.  Degenerative changes lower lumbar spine.  Non specific subcutaneous lesions lower abdomen pelvis may be related to injections.  Clinical correlation recommended.  Minimal third spacing of fluid.  IMPRESSION: Findings consistent with cirrhosis.  Splenomegaly.  Nonobstructing left  upper pole renal calculus.  Mild fullness right renal collecting system.  Cause indeterminate. Recently passed stone not excluded.  Prominent coronary artery calcifications.  Mild cardiomegaly. Trace pericardial effusion.  Atherosclerotic type changes aorta without aneurysmal dilation.  Nonspecific subcutaneous lesions anterior abdominal and pelvic subcutaneous region. This may be related to recent injections.  Small left-sided pleural effusion with basilar atelectasis/subtle infiltrate.  Original Report Authenticated By: Fuller Canada, M.D.  ASSESSMENT/PLAN:  1. Thrombocytopenia, ? Drug-induced vs iron deficiency vs dilutional.  Will monitor on outpatient basis.  Will repeat blood work in 2 weeks following D/C from hospital. 2. Splenomegaly by CT criteria 3. Diffuse fatty infiltration of liver 4. CHF, followed by cardiology 5. Type II DM 6. CAD, s/p cardiac bypass graft 7. Anemia, chronic.  H/O blood transfusion following cardiac bypass graft at [facility]. 8. Will discuss the patient's case with Dr. Mariel Sleet 9. Throat culture today. 10. Follow-up with the [facility] upon discharge in one month.  Will be scheduled by [facility] scheduler.  Communication completed.  11. 02/15/11 Lab work at [facility]: CBC diff, serum serotonin, and 24 hour urine collection for 5-HIAA   All questions were answered. The patient knows to call the clinic with any problems, questions or concerns. We can certainly see the patient much sooner if necessary.  The patient and plan discussed with Glenford Peers, MD and he is in agreement with the aforementioned.  Sameria Morss

## 2011-02-06 NOTE — Progress Notes (Addendum)
SUBJECTIVE: I am going stir crazy!  No complaints of pain or DOE.   Principal Problem:  *Systolic CHF, acute on chronic Active Problems:  Coronary atherosclerosis of native coronary artery  Essential hypertension, benign  HYPERLIPIDEMIA-MIXED  Thrombocytopenia  DM type 2 with diabetic peripheral neuropathy  Bradycardia  Hyperkalemia  Hyperphosphatemia  ARF (acute renal failure)  Bifascicular block  Anemia  Fatty liver  Cirrhosis  Splenomegaly   LABS: Basic Metabolic Panel:  Basename 02/06/11 0447 02/05/11 0534  NA 135 137  K 3.6 3.9  CL 93* 95*  CO2 34* 35*  GLUCOSE 203* 226*  BUN 28* 34*  CREATININE 1.07 1.17*  CALCIUM 9.2 9.4  MG -- --  PHOS -- --   CBC:  Basename 02/06/11 0447 02/05/11 0534  WBC 4.7 4.3  NEUTROABS -- 2.4  HGB 10.5* 10.4*  HCT 31.3* 31.2*  MCV 91.0 91.8  PLT 102* 114*    RADIOLOGY: Ct Abdomen Pelvis Wo Contrast  02/05/2011  *RADIOLOGY REPORT*  Clinical Data: Evaluate for splenomegaly.  Thrombocytopenia. Breast cancer.  Diabetic.  Hypertension.  Post hysterectomy and appendectomy.  CT ABDOMEN AND PELVIS WITHOUT CONTRAST  .  IMPRESSION: Findings consistent with cirrhosis.  Splenomegaly.  Nonobstructing left upper pole renal calculus.  Mild fullness right renal collecting system.  Cause indeterminate. Recently passed stone not excluded.  Prominent coronary artery calcifications.  Mild cardiomegaly. Trace pericardial effusion.  Atherosclerotic type changes aorta without aneurysmal dilation.  Nonspecific subcutaneous lesions anterior abdominal and pelvic subcutaneous region. This may be related to recent injections.  Small left-sided pleural effusion with basilar atelectasis/subtle infiltrate.  Original Report Authenticated By: Fuller Canada, M.D.     PHYSICAL EXAM BP 119/69  Pulse 90  Temp(Src) 99.1 F (37.3 C) (Oral)  Resp 18  Ht 5\' 5"  (1.651 m)  Wt 173 lb 8 oz (78.7 kg)  BMI 28.87 kg/m2  SpO2 91%  LMP 01/05/1996 Wt down 34 lbs since  admission. General: Well developed, well nourished, in no acute distress Head: Eyes PERRLA, No xanthomas.   Normal cephalic and atramatic  Lungs: Clear bilaterally to auscultation and percussion. Heart: HRRR S1 S2, distant heart sounds.  No MRG .  Pulses are 2+ & equal.            No carotid bruit. No JVD.  No abdominal bruits. No femoral bruits. Abdomen: Bowel sounds are positive, abdomen soft and non-tender without masses or                  Hernia's noted. Msk:  Back normal, normal gait. Normal strength and tone for age. Extremities: No clubbing, cyanosis or edema.  DP +1 Neuro: Alert and oriented X 3. Psych:  Good affect, responds appropriately  TELEMETRY: Reviewed telemetry pt in: NSR  ASSESSMENT AND PLAN:  1. Acute on chronic systolic heart failure: She is doing very well. She is down a total of 34 pounds since admission. Breathing status has remarkably improved. Electrolytes are within normal limits. She is very anxious to return home. Would recommend doing so once hematology consultation is completed. I have allowed her to sit outside in the sun to help her to feel less confined. Recommend continuing Lasix 40 mg twice a day as an outpatient along with enteric-coated aspirin. Heart rate is well-controlled without evidence of bradycardia can consider as an outpatient restarting a rate control medication however would heart walk on doing so until she was back to baseline activity. Concern about returned to a significantly low heart rate even  with the lowest dose of a beta blocker. She  was unable tolerate beta blocker during recent admission we'll make followup appointment for early evaluation..  2. CAD: Status post coronary artery bypass grafting: She is stable from this point. She remains mildly tender and sore at the sternotomy incision site, otherwise asymptomatic. She will need to followup with Dr. Morton Peters post discharge. She may have a previously scheduled appointment.  3.  Bradycardia: Completely resolved. Recommendations as above.  4. Thrombocytopenia: Platelets continue to decrease. Hematology consultation has been completed although notes and not available for review. If able to treat as an outpatient she can return home from a cardiac standpoint and await their recommendations.  Bettey Mare. Lyman Bishop NP [facility] 02/06/2011, 9:20 AM   Attending note:  Reviewed with Ms. Lawrence. Clinically much better from cardiac perspective. Workup for thrombocytopenia per Hematology consultation pending. From cardiac perspective would continue Lasix at 40 mg QD as outpatient, ASA, Zocor - no beta blocker, ACE-I, or Amiodarone at this point. We should see her back for cardiology followup within 2 weeks. Can recheck BMET at that time and determine standing Lasix dosing from there.  Jonelle Sidle, M.D., F.A.C.C.

## 2011-02-06 NOTE — Discharge Summary (Signed)
Pt d/c to home accompanied by spouse.  In stable condition.  All f/u appts given to pt with verbal understanding.  Pt instructed to return to ED if s/s worsen or her condition does not improve.  All questions and concerns discussed. Discharged in stable condition with spouse.

## 2011-02-06 NOTE — Progress Notes (Signed)
Physical Therapy Treatment Patient Details Name: [name] MRN: [id number] DOB: 1950/12/01 Today's Date: 02/06/2011  TIME:[id number]/ TE-24mins GT  PT Assessment/Plan  PT - Assessment/Plan Comments on Treatment Session: Pt tolerated therapy well today despite feeling extremely tired "from a bad night"; Pt had taken O2 cannula off prior to therapy, gait training and exercises done without O2 and sats remained above 91% PT Goals  Acute Rehab PT Goals PT Goal: Supine/Side to Sit - Progress: Progressing toward goal PT Goal: Sit to Stand - Progress: Met PT Goal: Stand to Sit - Progress: Met PT Goal: Ambulate - Progress: Progressing toward goal PT Goal: Up/Down Stairs - Progress: Progressing toward goal  PT Treatment Precautions/Restrictions  Precautions Precautions: Sternal;Fall Restrictions Weight Bearing Restrictions: No Mobility (including Balance) Bed Mobility Sit to Supine - Left: 6: Modified independent (Device/Increase time) Sit to Supine - Left Details (indicate cue type and reason): HOB elevated 30 degrees Transfers Transfers: Yes Sit to Stand: 6: Modified independent (Device/Increase time) Stand to Sit: 6: Modified independent (Device/Increase time) Ambulation/Gait Ambulation/Gait: Yes Ambulation/Gait Assistance: 5: Supervision Ambulation Distance (Feet): 110 Feet Assistive device: Rolling walker Gait velocity: extremely slow    Exercise  General Exercises - Upper Extremity Shoulder Flexion: Both;10 reps Shoulder Horizontal ABduction: Both;10 reps General Exercises - Lower Extremity Long Arc Quad: Both;20 reps Toe Raises: Both;20 reps Heel Raises: Both (20 reps) End of Session PT - End of Session Equipment Utilized During Treatment: Gait belt Activity Tolerance: Patient tolerated treatment well;Patient limited by fatigue Patient left: in chair;with call bell in reach;with family/visitor present General Behavior During Session: [facility] for tasks  performed Cognition: [facility] for tasks performed  Aron Needles ATKINSO 02/06/2011, 10:31 AM

## 2011-02-07 ENCOUNTER — Encounter (HOSPITAL_COMMUNITY): Payer: Self-pay | Admitting: Internal Medicine

## 2011-02-07 DIAGNOSIS — N200 Calculus of kidney: Secondary | ICD-10-CM

## 2011-02-07 HISTORY — DX: Calculus of kidney: N20.0

## 2011-02-07 LAB — HEPARIN INDUCED THROMBOCYTOPENIA PNL
UFH High Dose UFH H: 0 % Release
UFH Low Dose 0.5 IU/mL: 0 % Release
UFH SRA Result: NEGATIVE

## 2011-02-07 LAB — STREP A DNA PROBE

## 2011-02-07 NOTE — Progress Notes (Signed)
Utilization review completed.  

## 2011-02-07 NOTE — Discharge Summary (Signed)
Physician Discharge Summary  [name] MRN: [id number] DOB/AGE: [birth year] 61 y.o.  PCP: Leone Payor, MD   Admit date: 01/29/2011 Discharge date: 02/06/2011  Discharge Diagnoses:  1. Acute on chronic systolic congestive heart failure. The latest 2-D echocardiogram revealed an ejection fraction of 40-45%. The patient's weight on admission was 89.6 kg. And at the time of discharge, it was 78.7. She diuresed a -12.4 L. Her BNP was greater than 12,000 on admission and decreased to 3200 during the hospitalization. ACE inhibitor therapy was discontinued secondary to relative hypotension, acute renal failure, and hyperkalemia.  2. Bradycardia and bifascicular block. Attributed to amiodarone and metoprolol. Both medications are now contraindicated in this patient. 3. Acute renal failure with associated hyperkalemia and hyperphosphatemia. Her BUN was 105 on admission and 28 at the time of discharge. Her creatinine was 3.48 on admission and 1.07 at the time of discharge. The acute renal failure was thought to be secondary to hypoperfusion in the setting of bradycardia and relative hypotension. 4. Hyponatremia, secondary to congestive heart failure. Resolved. Her serum sodium was 135 at the time of discharge. 5. Anemia secondary to recent blood loss from CABG, status post 1 unit of packed red blood cells during this hospitalization. Her hemoglobin was 10.5 prior to discharge. Her vitamin B 12 was 1274, folate greater than 20, ferritin 258, total iron 33, and TIBC 381. 6. Thrombocytopenia, likely multifactorial. HIT panel pending at the time of discharge. Her platelet count was 333 on admission and 102 at the time of discharge. 7. Radiographic evidence of cirrhosis, fatty liver, and splenomegaly. 8. Type 2 diabetes mellitus. Hemoglobin A1c was 6.8. 9. Deconditioning. 10. 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 Status post cardioversion x4. Perioperative blood loss, requiring multiple units of packed red blood cell transfusions.   Discharge Medication List as of 02/07/2011  8:31 AM    START taking these medications   Details  Multiple Vitamin (MULTIVITAMIN) tablet Take 1 tablet by mouth daily., Starting 02/06/2011, Until Discontinued, OTC      CONTINUE these medications which have NOT CHANGED   Details  aspirin 325 MG tablet Take 325 mg by mouth daily.  , Until Discontinued, Historical Med    folic acid (FOLVITE) 1 MG tablet Take 1 tablet (1 mg total) by mouth daily. For one month then may stop., Starting 01/24/2011, Until Thu 01/24/12, OTC    furosemide (LASIX) 40 MG tablet Take 1 tablet (40 mg total) by mouth daily. For, Starting 01/25/2011, Until Fri 01/25/12, Print    gabapentin (NEURONTIN) 300 MG capsule Take 300 mg by mouth 3 (three) times daily.  , Until Discontinued, Historical Med    insulin glargine (LANTUS) 100 UNIT/ML injection Inject 30 Units into the skin 2 (two) times daily. Patient to gradually increase her insulin glargine to pre op dose of 100 units [place] two times daily.  Contact medical doctor for any questions., Starting 01/24/2011, Until Discontinued, No Print    insulin lispro (HUMALOG) 100 UNIT/ML injection Inject 2 Units into the skin 3 (three) times daily before meals. Patient to gradually increase insulin lispro to the pre op dose of 84 units [place] three times daily.  Contact medical doctor for any questions., Starting 01/24/2011, Until Discontinued, No Pri nt    metFORMIN (GLUCOPHAGE) 500 MG tablet Take 500 mg by mouth 2 (two) times daily with a meal.  , Until Discontinued, Historical Med    PARoxetine (PAXIL) 20 MG tablet Take  20 mg by mouth every morning.  , Until Discontinued, Historical Med    polysaccharide iron (NIFEREX) 150 MG CAPS capsule Take 1 capsule (150 mg total) by mouth daily. For one month then may stop., Starting 01/24/2011, Until Discontinued,  Print    potassium chloride (K-DUR,KLOR-CON) 10 MEQ tablet Take 1 tablet (10 mEq total) by mouth daily., Starting 01/25/2011, Until Fri 01/25/12, Print    simvastatin (ZOCOR) 20 MG tablet Take 20 mg by mouth at bedtime.  , Until Discontinued, Historical Med      STOP taking these medications     traMADol (ULTRAM) 50 MG tablet      amiodarone (PACERONE) 200 MG tablet      lisinopril (PRINIVIL,ZESTRIL) 10 MG tablet      metoprolol (LOPRESSOR) 50 MG tablet         Discharge Condition: Improved and stable condition.  Disposition: Home or Self Care   Consults: Dr.Bekadu, Dr. Diona Browner, Dr. Dietrich Pates, and Dr. Jodene Nam. Dr. Lovell Sheehan for central line.   Significant Diagnostic Studies: Ct Abdomen Pelvis Wo Contrast  02/05/2011  *RADIOLOGY REPORT*  Clinical Data: Evaluate for splenomegaly.  Thrombocytopenia. Breast cancer.  Diabetic.  Hypertension.  Post hysterectomy and appendectomy.  CT ABDOMEN AND PELVIS WITHOUT CONTRAST  Technique:  Multidetector CT imaging of the abdomen and pelvis was performed following the standard protocol without intravenous contrast.  Comparison: 01/31/2011 renal sonogram.  02/08/2007CT.  Findings: Small left-sided pleural effusion with basilar atelectasis/subtle infiltrate.  Cirrhotic appearance of the liver.  Liver appears dense. Unenhanced imaging without focal liver lesion.  The spleen is enlarged and measures 11.8 cm in length with transverse dimension of 14.9 x 5.9 cm consistent with a splenic volume of 519 ml (upper range normal 412 ml).  Prominent coronary artery calcifications.  Mild cardiomegaly. Trace to small pericardial effusion.  Scattered colonic diverticula.  No extraluminal bowel inflammatory process, free air or free fluid.  Atherosclerotic type changes of the aorta with mild ectasia. Atherosclerotic type changes of the aortic branch vessels including iliac arteries.  Partially decompressed urinary bladder with circumferential thickening of the bladder  wall without obvious mass.  Left upper pole nonobstructing 2.6 cm stone approaching a partial staghorn calculus appearance.  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  There is mild fullness of the right renal collecting system with mild infiltration of the fat planes along the lower right kidney and right ureter.  No calcified renal stone seen along the course of the right ureter.  This slight collecting system fullness could be explained by a noncalcified stone, blood clot, tumor or recently passed stone.  No focal pancreatic or adrenal lesion noted on this unenhanced exam.  Scattered normal sized lymph nodes upper abdomen and retroperitoneal region.  Elongated and portacaval leads.  No bony destructive lesion.  Degenerative changes lower lumbar spine.  Non specific subcutaneous lesions lower abdomen pelvis may be related to injections.  Clinical correlation recommended.  Minimal third spacing of fluid.  IMPRESSION: Findings consistent with cirrhosis.  Splenomegaly.  Nonobstructing left upper pole renal calculus.  Mild fullness right renal collecting system.  Cause indeterminate. Recently passed stone not excluded.  Prominent coronary artery calcifications.  Mild cardiomegaly. Trace pericardial effusion.  Atherosclerotic type changes aorta without aneurysmal dilation.  Nonspecific subcutaneous lesions anterior abdominal and pelvic subcutaneous region. This may be related to recent injections.  Small left-sided pleural effusion with basilar atelectasis/subtle infiltrate.  Original Report Authenticated By: Fuller Canada, M.D.   Dg Chest  1 View  01/31/2011  *RADIOLOGY REPORT*  Clinical Data: CHF  CHEST - 1 VIEW  Comparison: 01/29/2011  Findings: Stable left subclavian central venous catheter.  Moderate cardiomegaly.  Bibasilar atelectasis and bilateral pleural effusions stable.  No pneumothorax.  IMPRESSION: Stable bilateral effusions and bibasilar atelectasis.   Original Report Authenticated By: Donavan Burnet, M.D.   Dg Chest 2 View  01/24/2011  *RADIOLOGY REPORT*  Clinical Data: Post CABG, effusion.  CHEST - 2 VIEW  Comparison: 01/23/2011 and 01/11/2011.  Findings: Trachea is midline.  Heart is mildly enlarged, stable. Sternotomy wires are stable in position from baseline postoperative examination of 01/11/2011. Left PICC tip projects over the SVC/RA junction.  There are small bilateral pleural effusions, right greater than left, stable, with mild bibasilar air space disease. Linear subsegmental atelectasis in the lingula.  IMPRESSION:  Small bilateral pleural effusions and bibasilar air space disease, right greater than left, stable.  Original Report Authenticated By: Reyes Ivan, M.D.   Dg Chest 2 View  01/23/2011  *RADIOLOGY REPORT*  Clinical Data: Shortness of breath.  Post CABG.  CHEST - 2 VIEW  Comparison: 01/22/2011.  Findings: Left PICC line tip proximal right atrium level.  To be within the distal superior vena cava, this can be retracted by 2.5 cm.  Post CABG.  Cardiomegaly.  Bilateral pleural effusions greater on the right.  Pulmonary vascular congestion.  No gross pneumothorax.  IMPRESSION: Post CABG.  Cardiomegaly.  Bilateral pleural effusions greater on the right with adjacent atelectasis.  Central pulmonary vessel prominence without frank pulmonary edema.  Original Report Authenticated By: Fuller Canada, M.D.   Dg Chest 2 View  01/22/2011  *RADIOLOGY REPORT*  Clinical Data: Shortness of breath.  CHEST - 2 VIEW  Comparison: Chest x-ray 01/19/2011.  Findings: The left PICC line is stable.  The heart is mildly enlarged but unchanged.  There are persistent bilateral pleural effusions with overlying atelectasis but overall the lungs shows slight improved aeration.  IMPRESSION: Persistent bilateral effusions and overlying atelectasis but overall improved lung aeration.  Original Report Authenticated By: P. Loralie Champagne, M.D.   Dg Chest 2  View If Not Done In The Past 7 Days  01/10/2011  *RADIOLOGY REPORT*  Clinical Data: Shortness of breath, chest pain  CHEST - 2 VIEW  Comparison: 01/05/2011  Findings: The lungs are essentially clear. No pleural effusion or pneumothorax.  The heart is top normal in size.  Coronary stents.  Mild degenerative changes of the visualized thoracolumbar spine.  IMPRESSION: No evidence of acute cardiopulmonary disease.  Original Report Authenticated By: Charline Bills, M.D.   US Renal  01/31/2011  *RADIOLOGY REPORT*  Clinical Data: Acute renal failure.  RENAL/URINARY TRACT ULTRASOUND COMPLETE  Comparison:  CT scan 04/05/2005.  Findings:  Right Kidney:  13.0 cm in length. Normal renal cortical thickness and echogenicity without focal lesions or hydronephrosis. 1.3 cm echogenic focus is noted in the upper pole region.  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  Left Kidney:  11.7 cm in length. Normal renal cortical thickness and echogenicity without focal lesions or hydronephrosis.  Bladder:  Decompressed by a Foley catheter.  Additional findings:  Diffuse fatty infiltration of the liver is noted.  IMPRESSION:  1.  Normal renal cortical thickness and echogenicity without hydronephrosis. 2.  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 3.  Diffuse fatty infiltration of the liver.  Original Report Authenticated By: P. Loralie Champagne, M.D.  Dg Chest Port 1 View  01/29/2011  *RADIOLOGY REPORT*  Clinical Data: Bradycardia.  PORTABLE CHEST - 1 VIEW  Comparison: 01/24/2011.  Findings: Stable enlarged cardiac silhouette and post CABG changes. Increased opacity at both lung bases with persistent bilateral pleural fluid.  IMPRESSION:  1.  Increased bibasilar atelectasis/pneumonia. 2.  No significant change in bilateral pleural fluid.  Original Report Authenticated By: Darrol Angel, M.D.   Dg Chest Port 1 View  01/19/2011  *RADIOLOGY REPORT*  Clinical Data: Short  of breath  PORTABLE CHEST - 1 VIEW  Comparison: 01/17/2011  Findings: Moderate cardiomegaly.  Bilateral central basilar opacity likely a combination of airspace disease and pleural fluid.  Stable left PICC.  No pneumothorax.  IMPRESSION: Stable bilateral airspace disease and bilateral effusions.  Original Report Authenticated By: Donavan Burnet, M.D.   Dg Chest Port 1 View  01/17/2011  *RADIOLOGY REPORT*  Clinical Data: Shortness of breath.  Postop heart surgery. Respiratory distress.  Follow-up.  PORTABLE CHEST - 1 VIEW  Comparison: 01/16/2011.  Findings: Tip of left PICC terminates in the area of the cavoatrial junction.  No pneumothorax.  Stable moderate enlargement of the cardiac silhouette. The patient has undergone previous median sternotomy and coronary artery bypass grafting. Subsegmental atelectasis in left midlung area unchanged.  Slight blunting of left costophrenic angle consistent with small amount of pleural effusion.  Minimal left basilar atelectasis.  Atelectasis and hazy opacity in the lower portion of the right lung.  Indistinctness of right costophrenic angle probably reflects small amount of right pleural effusion.  No consolidation evident.  IMPRESSION: PICC in place.  Stable cardiac silhouette enlargement.  Post CABG. Stable subsegmental atelectasis in left midlung area.  Small amount of left pleural effusion.  Minimal left basilar atelectasis. Atelectasis and hazy opacity in the lower portion of the right lung.  Indistinctness of right costophrenic angle probably reflects small amount of right pleural effusion.  No consolidation evident. No new lesion is evident.  Original Report Authenticated By: Crawford Givens, M.D.   Dg Chest Port 1 View  01/16/2011  *RADIOLOGY REPORT*  Clinical Data: Shortness of breath.  PORTABLE CHEST - 1 VIEW  Comparison: 01/15/2011.  Findings: Endotracheal tube, nasogastric tube and right central line has been removed.  Placement of the left PICC line with the tip  at the cavoatrial junction/proximal right atrium.  Tiny right apical pneumothorax not excluded.  Attention to this on follow-up.  This may be related to overlying structures rather than pneumothorax.  Cardiomegaly.  Pulmonary vascular congestion/pulmonary edema with pleural effusions.  Consolidation lung bases may be related to pleural effusions and atelectasis although infiltrate not excluded.  Post median sternotomy/CABG.  Atelectatic changes peripheral aspect left mid lung zone.  IMPRESSION: Left PICC line tip cavoatrial junction/proximal right atrium.  Tiny right apical pneumothorax not excluded although this may represent overlying structure.  Attention to this on follow-up.  Cardiomegaly with pulmonary vascular congestion/pulmonary edema.  Consolidation lung bases may reflect pleural fluid with atelectasis although infiltrate not excluded.  Original Report Authenticated By: Fuller Canada, M.D.   Dg Chest Port 1 View  01/15/2011  *RADIOLOGY REPORT*  Clinical Data: Follow-up heart surgery.  PORTABLE CHEST - 1 VIEW  Comparison: 01/14/2011.  Findings: Endotracheal tube terminates approximately 2.4 cm above the carina.  Nasogastric tube is followed into the stomach.  Right IJ central line tip projects over the SVC.  Sternotomy wires are stable in position.  Cardiomediastinal silhouette is stable.  There is improving focal airspace disease in  the left midlung zone.  Mild central pulmonary vascular congestion with bibasilar air space disease and bilateral pleural effusions.  IMPRESSION:  1.  Central pulmonary vascular congestion with bibasilar air space disease and bilateral pleural effusions. 2.  Improving focal airspace disease in the left midlung zone.  Original Report Authenticated By: Reyes Ivan, M.D.   Dg Chest Port 1 View  01/14/2011  *RADIOLOGY REPORT*  Clinical Data: Respiratory failure.  PORTABLE CHEST - 1 VIEW  Comparison: 01/13/2011  Findings: Endotracheal tube tip is 2.8 cm above the  carina.  Right internal jugular introducer sheath projects over the SVC.  Nasogastric tube extends beyond the inferior margin of today's image.  The patient is rotated to the right on today's exam, resulting in reduced diagnostic sensitivity and specificity.  Mild cardiomegaly noted with continued obscuration of both lung bases.  Stable focal density peripherally in the left midlung noted.  The edema is suggested on yesterday's chest radiograph has improved.  IMPRESSION:  1.  Improved edema. 2.  Layering bilateral pleural effusions with passive atelectasis. 3.  Cardiomegaly. 4.  Peripheral density left mid lung may represent fluid in the major fissure. 5.  Endotracheal tube position is satisfactory.  Nasogastric tube extends beyond the inferior margin of today's image.  Original Report Authenticated By: Dellia Cloud, M.D.   Dg Chest Portable 1 View In Am  01/13/2011  *RADIOLOGY REPORT*  Clinical Data: Status post CABG.  PORTABLE CHEST - 1 VIEW  Comparison: Chest 01/12/2011.  Findings: Left chest tube and Swan-Ganz catheter have been removed. Right IJ sheath remains in place.  No pneumothorax is identified.  There has been interval increase in small to moderate bilateral pleural effusions and basilar atelectasis.  Cardiomegaly is noted.  IMPRESSION:  1.  Status post Swan-Ganz catheter and left chest tube removal.  No pneumothorax. 2.  Increased bilateral pleural effusions and basilar atelectasis.  Original Report Authenticated By: Bernadene Bell. D'ALESSIO, M.D.   Dg Chest Portable 1 View In Am  01/12/2011  *RADIOLOGY REPORT*  Clinical Data: Day one postop open heart surgery.  PORTABLE CHEST - 1 VIEW  Comparison: 01/11/2011  Findings: Endotracheal tube and NG tube have been removed.  Chest tubes and Swan-Ganz catheter are unchanged.  New slight atelectasis at the lung bases.  No pneumothorax.  Vascularity is normal.  IMPRESSION: New slight atelectasis at the lung bases.  Original Report Authenticated By:  Gwynn Burly, M.D.   Dg Chest Portable 1 View  01/11/2011  *RADIOLOGY REPORT*  Clinical Data: Postoperative chest radiograph.  PORTABLE CHEST - 1 VIEW  Comparison: Chest radiograph performed 01/10/2011  Findings: The patient's endotracheal tube is seen ending 4 cm above the carina.  An enteric tube is noted extending below the diaphragm.  A right IJ Swan-Ganz catheter is noted ending at the right main pulmonary artery.  A mediastinal drain is grossly unremarkable in appearance.  Lung expansion is mildly decreased. Minimal bilateral atelectasis is noted.  No pleural effusion or pneumothorax is seen.  The cardiomediastinal silhouette is borderline enlarged; the patient is status post median sternotomy, with evidence of CABG. No acute osseous abnormalities are identified.  IMPRESSION:  1.  Endotracheal tube seen ending 4 cm above the carina. 2.  Other tubes and lines as described above. 3.  Minimal bilateral atelectasis and borderline cardiomegaly noted.  Original Report Authenticated By: Tonia Ghent, M.D.   Dg Chest Port 1v Same Day  01/29/2011  *RADIOLOGY REPORT*  Clinical Data: Central line placement  PORTABLE CHEST -  1 VIEW SAME DAY  Comparison: Portable exam 1900 hours compared to 1643 hours  Findings: New left subclavian central venous catheter, tip projecting over SVC at the level of the azygos confluence. Enlargement of cardiac silhouette post CABG. Pulmonary vascular congestion. Bibasilar infiltrates and question pleural effusions little changed. Atelectasis left mid lung. No pneumothorax following central line placement.  IMPRESSION: No pneumothorax following central line placement. Persistent bibasilar consolidation and question pleural effusions.  Original Report Authenticated By: Lollie Marrow, M.D.     Microbiology: Recent Results (from the past 240 hour(s))  MRSA PCR SCREENING     Status: Normal   Collection Time   01/29/11  5:55 PM      Component Value Range Status Comment   MRSA  by PCR NEGATIVE  NEGATIVE  Final   PATHOLOGIST SMEAR REVIEW     Status: Normal   Collection Time   02/04/11  1:30 PM      Component Value Range Status Comment   Tech Review Reviewed by Elana Alm. Hillard, MD   Final   STREP A DNA PROBE     Status: Normal   Collection Time   02/06/11  5:40 PM      Component Value Range Status Comment   Specimen Description THROAT   Final    Special Requests NONE   Final    Group A Strep Probe NEGATIVE   Final    Report Status 02/07/2011 FINAL   Final      Labs: Results for orders placed during the hospital encounter of 01/29/11 (from the past 48 hour(s))  GLUCOSE, CAPILLARY     Status: Abnormal   Collection Time   02/05/11  5:02 PM      Component Value Range Comment   Glucose-Capillary 184 (*) 70 - 99 (mg/dL)   GLUCOSE, CAPILLARY     Status: Abnormal   Collection Time   02/05/11  8:45 PM      Component Value Range Comment   Glucose-Capillary 284 (*) 70 - 99 (mg/dL)    Comment 1 Notify RN     BASIC METABOLIC PANEL     Status: Abnormal   Collection Time   02/06/11  4:47 AM      Component Value Range Comment   Sodium 135  135 - 145 (mEq/L)    Potassium 3.6  3.5 - 5.1 (mEq/L)    Chloride 93 (*) 96 - 112 (mEq/L)    CO2 34 (*) 19 - 32 (mEq/L)    Glucose, Bld 203 (*) 70 - 99 (mg/dL)    BUN 28 (*) 6 - 23 (mg/dL)    Creatinine, Ser 1.61  0.50 - 1.10 (mg/dL)    Calcium 9.2  8.4 - 10.5 (mg/dL)    GFR calc non Af Amer 55 (*) >90 (mL/min)    GFR calc Af Amer 64 (*) >90 (mL/min)   CBC     Status: Abnormal   Collection Time   02/06/11  4:47 AM      Component Value Range Comment   WBC 4.7  4.0 - 10.5 (K/uL)    RBC 3.44 (*) 3.87 - 5.11 (MIL/uL)    Hemoglobin 10.5 (*) 12.0 - 15.0 (g/dL)    HCT 09.6 (*) 04.5 - 46.0 (%)    MCV 91.0  78.0 - 100.0 (fL)    MCH 30.5  26.0 - 34.0 (pg)    MCHC 33.5  30.0 - 36.0 (g/dL)    RDW 40.9  81.1 - 91.4 (%)  Platelets 102 (*) 150 - 400 (K/uL)   GLUCOSE, CAPILLARY     Status: Abnormal   Collection Time   02/06/11   7:31 AM      Component Value Range Comment   Glucose-Capillary 195 (*) 70 - 99 (mg/dL)   GLUCOSE, CAPILLARY     Status: Abnormal   Collection Time   02/06/11 12:03 PM      Component Value Range Comment   Glucose-Capillary 202 (*) 70 - 99 (mg/dL)   GLUCOSE, CAPILLARY     Status: Abnormal   Collection Time   02/06/11  5:05 PM      Component Value Range Comment   Glucose-Capillary 230 (*) 70 - 99 (mg/dL)   STREP A DNA PROBE     Status: Normal   Collection Time   02/06/11  5:40 PM      Component Value Range Comment   Specimen Description THROAT      Special Requests NONE      Group A Strep Probe NEGATIVE      Report Status 02/07/2011 FINAL        HPI : The patient is a 60 year old woman with a past medical history significant for four-vessel CABG in November 2012, chronic systolic congestive heart failure, and type 2 diabetes mellitus, who was directly admitted from Dr. Ival Bible office with bradycardia and congestive heart failure exacerbation. She was noted to be volume overloaded in Dr. Ival Bible office. Her heart rate was low at around 40. The initial lab data revealed a serum potassium of 7.0, creatinine of 3.84, and BUN of 105. At the time of discharge from [facility] on 01/23/2011, her potassium was 4.7, BUN 49 and creatinine was 1.24. Her BNP on admission during this hospitalization was greater than 12,000. It was 3000 prior to discharge in November. Her chest x-ray on admission revealed increased bibasilar infiltrates or atelectasis and unchanged bilateral pleural effusions. Her EKG revealed sinus bradycardia with a heart rate of 38 beats per minute and bifascicular block. She was admitted to the ICU.  HOSPITAL COURSE: On admission, the patient had a lack of IV access. Dr. Franky Macho was consulted to place a central line. Following placement, she was admitted to the ICU. Her hyperkalemia was treated with Kayexalate, D50, insulin, calcium chloride, and sodium bicarbonate.  ACE inhibitor therapy and potassium supplementation were discontinued. Both amiodarone and metoprolol were discontinued secondary to severe bradycardia. Cardiologist, Dr. Diona Browner was consulted. He recommended starting a dopamine drip to help with both her heart rate and a relatively low blood pressure. IV Lasix was started. Nephrologist, Dr. Kristian Covey who was consulted as well. He increased the IV Lasix to 160 mg IV every 6 hours and started IV fluids concomitantly. A renal ultrasound was ordered and it was virtually unremarkable. Her TSH was assessed and it was within normal limits at 0.980.  Additional Kayexalate had to be given the next day for a persistent elevated serum potassium of 5.7. During the remainder of the hospitalization, however, her serum potassium normalized. During the first 2 days of the hospitalization, the patient actually gained weight. Lasix and IV fluids were adjusted accordingly.  Her renal function began to improve. Dopamine was discontinued following improvement in her renal function, heart rate, and blood pressure. She was transferred out of the ICU. She began diuresed and at least 1-2 L negative daily. Her body weight began to increase 1-2 kg daily. She became symptomatically improved. At the time of hospital discharge, she had diuresed well over  12 L of urine and her weight decreased by 11 kg. Dr. Diona Browner adjusted her medications at the time of discharge. Lasix orally was resumed at 40 mg daily for outpatient treatment. She was also continued on aspirin and statin therapy. Amiodarone, lisinopril, and all beta blockers were discontinued for the reasons mentioned above.  The patient remained hemodynamically stable following her transfer out of the ICU. However, her hemoglobin did fall to a nadir of 7.9. An anemia panel was ordered. The results were dictated above. She was transfused one unit of packed red blood cells. Following transfusion, her hemoglobin improved to 9.0 and then  subsequently to greater than 10 at the time of discharge.  Her platelet count began to fall gradually over the course of the hospitalization.. At the time of admission, her platelet count was 333. Several days following admission, it fell to 195 and then to 147. There was no evidence of acute blood loss noted. However, prophylactic Lovenox was discontinued. HIT panel was ordered. Hematologist Dr. Jodene Nam was consulted. He reviewed the blood smear that was obtained earlier. He noted in review of her previous radiographic studies, that she had splenomegaly. He ordered a CT scan of her abdomen and pelvis and it revealed changes consistent with cirrhosis and splenomegaly. He believed that her thrombocytopenia was likely multifactorial. He will followup with her in the outpatient setting a week following discharge for further evaluation and management and review of the HIT panel results pending.    Discharge Exam:  Blood pressure 119/69, pulse 90, temperature 99.1 F (37.3 C), temperature source Oral, resp. rate 18, height 5\' 5"  (1.651 m), weight 78.7 kg (173 lb 8 oz), last menstrual period 01/05/1996, SpO2 95.00%.  Lungs: Occasional crackles in the bases, otherwise clear. Heart: S1, S2, with a soft systolic murmur. Abdomen: Positive bowel sounds, soft, nontender, nondistended. Extremity: No pedal edema.   Discharge Orders    Future Appointments: Provider: Department: Dept Phone: Center:   02/12/2011 2:00 PM [facility] [phone] [facility]   02/14/2011 2:40 PM Jonelle Sidle, MD [facility] [phone] [facility]   02/15/2011 9:50 AM [facility] [phone] None   03/12/2011 1:30 PM Lorenza Burton, NP [facility] [phone] [facility]   03/19/2011 10:00 AM Randall An, MD [facility] [phone] None     Future Orders Please Complete By Expires   Diet - low sodium heart healthy      Diet Carb Modified      Increase activity slowly       Discharge instructions      Comments:   MAKE NOTE THAT AMIODARONE, METOPROLOL AND LISINOPRIL WERE STOPPED.      Follow-up Information    Follow up with [facility] in 4 weeks. (As needed)    Contact information:   [address] [place] [address] [phone]       Follow up with Nona Dell, MD on 02/14/2011. (2:40 pm [place] office)    Contact information:   [address] [place] [address] [phone]       Follow up with [facility] on 02/15/2011. (AT 10: AM. Hematologist.)    Contact information:   [address] [place] [address] [phone]       Follow up with Lorenza Burton, NP on 03/12/2011. (AT 1:30 PM. GASTROENTEROLOGY NP)    Contact information:   [address] [place] [address] [phone]          Discharge  time: 40 minutes.  Signed: Pasty Manninen 02/07/2011, 4:06 PM

## 2011-02-08 ENCOUNTER — Other Ambulatory Visit: Payer: Self-pay | Admitting: Cardiothoracic Surgery

## 2011-02-08 DIAGNOSIS — I2510 Atherosclerotic heart disease of native coronary artery without angina pectoris: Secondary | ICD-10-CM

## 2011-02-12 ENCOUNTER — Ambulatory Visit
Admission: RE | Admit: 2011-02-12 | Discharge: 2011-02-12 | Disposition: A | Discharge status: Home / Self Care | Payer: Managed Care, Other (non HMO) | Source: Ambulatory Visit | Attending: Cardiothoracic Surgery | Admitting: Cardiothoracic Surgery

## 2011-02-12 ENCOUNTER — Ambulatory Visit (INDEPENDENT_AMBULATORY_CARE_PROVIDER_SITE_OTHER): Payer: Self-pay | Admitting: Physician Assistant

## 2011-02-12 VITALS — BP 116/72 | HR 104 | Temp 97.2°F | Resp 16 | Ht 65.0 in | Wt 166.0 lb

## 2011-02-12 DIAGNOSIS — Z09 Encounter for follow-up examination after completed treatment for conditions other than malignant neoplasm: Secondary | ICD-10-CM

## 2011-02-12 DIAGNOSIS — I2510 Atherosclerotic heart disease of native coronary artery without angina pectoris: Secondary | ICD-10-CM

## 2011-02-12 NOTE — Patient Instructions (Signed)
Begin saline wet to dry dressing changes daily to lower sternum

## 2011-02-12 NOTE — Progress Notes (Signed)
HPI: Patient returns for routine postoperative follow-up having undergone coronary artery bypass grafting x4 by Dr. Donata Clay on 01/11/2011. . The patient's postoperative course was notable for rapid atrial fibrillation associated with hypotension and cardiogenic shock requiring DCCV and reintubation. She ultimately recovered and was extubated without difficulty. She was treated with IV antibiotics for probable pneumonia. She also was started on antihypertensive agents for hypertension including lisinopril and Coreg. She was also aggressively diuresed for volume overload. She ultimately was able to be discharged home on 01/25/2011.   Since hospital discharge the patient  Developed bradycardia and acute on chronic systolic CHF requiring admission at [facility] from 01/29/2011 -02/06/2011. She was treated with aggressive diuresis and diuresed around 12 L during her hospitalization. She also was taken off amiodarone and metoprolol secondary to bradycardia and a bifascicular block. She developed acute renal failure which was thought to be secondary to hypoperfusion and hypertension but this had resolved by the time of discharge with her creatinine at 1.07 on DC. She was noted to have anemia and thrombocytopenia, both of which were worked up by hematology and felt to be multifactorial. She ultimately was able to be discharged home in improved condition.   She currently feels improved. She still has some shortness of breath with exertion but denies any chest pain. Her weight has been stable and her lower extremity edema is essentially resolved. She has had some nausea but her appetite has gradually improved. She is walking daily. She has a followup appointment scheduled with Dr. Diona Browner this week.  Current Outpatient Prescriptions  Medication Sig Dispense Refill  . aspirin 325 MG tablet Take 325 mg by mouth daily.        . folic acid (FOLVITE) 1 MG tablet Take 1 tablet (1 mg total) by mouth daily. For  one month then may stop.      . furosemide (LASIX) 40 MG tablet Take 1 tablet (40 mg total) by mouth daily. For  30 tablet  0  . gabapentin (NEURONTIN) 300 MG capsule Take 300 mg by mouth 3 (three) times daily.        . insulin glargine (LANTUS) 100 UNIT/ML injection Inject 30 Units into the skin 2 (two) times daily. Patient to gradually increase her insulin glargine to pre op dose of 100 units [place] two times daily.  Contact medical doctor for any questions.  10 mL    . insulin lispro (HUMALOG) 100 UNIT/ML injection Inject 2 Units into the skin 3 (three) times daily before meals. Patient to gradually increase insulin lispro to the pre op dose of 84 units [place] three times daily.  Contact medical doctor for any questions.  10 mL    . metFORMIN (GLUCOPHAGE) 500 MG tablet Take 500 mg by mouth 2 (two) times daily with a meal.        . Multiple Vitamin (MULTIVITAMIN) tablet Take 1 tablet by mouth daily.      Marland Kitchen PARoxetine (PAXIL) 20 MG tablet Take 20 mg by mouth every morning.        . polysaccharide iron (NIFEREX) 150 MG CAPS capsule Take 1 capsule (150 mg total) by mouth daily. For one month then may stop.  30 each  0  . potassium chloride (K-DUR,KLOR-CON) 10 MEQ tablet Take 1 tablet (10 mEq total) by mouth daily.  30 tablet  0  . simvastatin (ZOCOR) 20 MG tablet Take 20 mg by mouth at bedtime.          Physical Exam: BP  116/72 HR 104 Resp 16 Wounds: the upper portion of her sternal wound has healed well.  The lower portion of the sternal wound has superficially separated. The edges were debrided at the bedside today of necrotic tissue and a small area of central fatty necrosis. There is no surrounding erythema. The sternum is stable to palpation. Heart: regular rate and rhythm Lungs: Breath sounds diminished in the left base Extremities: mild lower extremity edema. EVH incisions have healed well   Diagnostic Tests: Chest xray: small residual left pleural effusion  Assessment/Plan: The patient  seems to be progressing slowly at this point. Her volume status appears to be much improved from her previous hospitalization. Her blood pressures and heart rate are also improved. She has a followup appointment this week with cardiology and followup with hematology and nephrology in the coming weeks. She has seen her primary care physician and her blood sugars are stable. I have started her on saline wet-to-dry dressing changes daily to the lower sternum. She may begin to increase her activity at this point. We will see her back in 2 weeks with a repeat chest x-ray or sooner if she develops problems in the interim.

## 2011-02-14 ENCOUNTER — Encounter: Payer: Self-pay | Admitting: Cardiology

## 2011-02-14 ENCOUNTER — Ambulatory Visit (INDEPENDENT_AMBULATORY_CARE_PROVIDER_SITE_OTHER): Payer: Managed Care, Other (non HMO) | Admitting: Cardiology

## 2011-02-14 DIAGNOSIS — I5022 Chronic systolic (congestive) heart failure: Secondary | ICD-10-CM

## 2011-02-14 DIAGNOSIS — I498 Other specified cardiac arrhythmias: Secondary | ICD-10-CM

## 2011-02-14 DIAGNOSIS — I10 Essential (primary) hypertension: Secondary | ICD-10-CM

## 2011-02-14 DIAGNOSIS — I4891 Unspecified atrial fibrillation: Secondary | ICD-10-CM

## 2011-02-14 DIAGNOSIS — R001 Bradycardia, unspecified: Secondary | ICD-10-CM

## 2011-02-14 DIAGNOSIS — I2510 Atherosclerotic heart disease of native coronary artery without angina pectoris: Secondary | ICD-10-CM

## 2011-02-14 MED ORDER — CARVEDILOL 3.125 MG PO TABS: 3.1250 mg | ORAL_TABLET | Freq: Two times a day (BID) | ORAL | Status: DC

## 2011-02-14 NOTE — Patient Instructions (Signed)
Your physician has recommended you make the following change in your medication: start taking Carvedilol (Coreg) 3.125 mg twice daily  Your physician recommends that you return for lab work in: this week  Your physician recommends that you schedule a follow-up appointment in: 2 weeks

## 2011-02-14 NOTE — Progress Notes (Signed)
Clinical Summary [name] is a 60 y.o.female presenting for followup. She was admitted from our office in early December with severe bradycardia in the setting of acute renal failure, hyperkalemia, and marked volume overload status post bypass surgery. With medication adjustments, principally including discontinuation of amiodarone and beta blocker, stabilization of electrolytes, and optimization of volume status, the patient improved significantly. She had a substantial diuresis of greater than 25 pounds, and while showing some evidence of conduction system disease, her heart rate stabilized nicely, without any clear need for device therapy. Renal function also normalized.  Recent lab work from 12/11 showed potassium 3.6, BUN 28, creatinine 0.7, hemoglobin 10.5, platelets 102. Echocardiogram from late November had shown an LVEF of 40-45% with anteroapical wall motion abnormalities, grade 2 diastolic dysfunction, small pericardial effusion.  She is here with her husband. Has home health nursing. States that she is doing well in general, gradually increasing activity. Had TCTS visit this week and is also to see Hematology related to thrombocytopenia. Reports no bleeding problems.  ECG reviewed below.   Allergies  Allergen Reactions  . Clopidogrel Bisulfate Hives  . Codeine Nausea And Vomiting  . Morphine Nausea And Vomiting    Medication list reviewed.  Past Medical History  Diagnosis Date  . Coronary atherosclerosis of native coronary artery     BMS circ 2001, DES LAD 2005, DES LAD and RCA 2008, subsequent CABG  . Drug allergy     Allergy to Plavix (hives) - took Ticlid in past  . Essential hypertension, benign   . Mixed hyperlipidemia   . Type 2 diabetes mellitus   . Breast cancer     Chemo and mastectomy  . Myocardial infarction ~ 2002  . Arthritis     rheumatoid  . Depression   . Peripheral neuropathy   . Peptic ulcer disease ~ 1975  . Nephrolithiasis 02/07/2011    Past  Surgical History  Procedure Date  . Mastectomy 1988    right  . Cholecystectomy 1969  . Abdominal hysterectomy 1989  . Tonsillectomy and adenoidectomy 1979  . Appendectomy 1969  . Tubal ligation 1972  . Coronary artery bypass graft 01/11/2011    Procedure: CORONARY ARTERY BYPASS GRAFTING (CABG);  Surgeon: Kathlee Nations Suann Larry, MD;  Location: [facility];  Service: Open Heart Surgery;  Laterality: N/A;  cabg x four, using left internal mammary artery and right leg greater saphenous vein harvestede endoscopically    Social History [name] reports that she quit smoking about 3 years ago. Her smoking use included Cigarettes. She has a 20 pack-year smoking history. She has never used smokeless tobacco. Ms. Kizer reports that she does not drink alcohol.  Review of Systems No palpitations or syncope. No angina or orthopnea. Occasional headaches. No edema. Appetite improving.  Physical Examination Filed Vitals:   02/14/11 1423  BP: 125/73  Pulse: 101  Resp: 16   Overweight woman in no acute distress. HEENT: Conjunctiva and lids normal, oropharynx clear with moist mucosa. Neck: Supple, no elevated JVP or carotid bruits, no thyromegaly. Lungs: Clear to auscultation, diminished at bases, nonlabored breathing at rest. Cardiac: Regular rate and rhythm, no S3 or pericardial rub. Abdomen: Nontender, bowel sounds present, no guarding or rebound. Extremities: No pitting edema, distal pulses1+. Skin: Warm and dry. Musculoskeletal: No kyphosis. Neuropsychiatric: Alert and oriented x3, affect grossly appropriate.   ECG Sinus rhythm at 100, RBBB, LAFB, anteroseptal infarct pattern.   Problem List and Plan

## 2011-02-14 NOTE — Assessment & Plan Note (Addendum)
Volume status markedly improved. Check BMET and for now continue Lasix at 40 mg daily. Weight has been stable at home. No ACE inhibitor as yet. Will try low dose Coreg at 3.125 mg BID. Close followup in 2 weeks.

## 2011-02-14 NOTE — Assessment & Plan Note (Signed)
Blood pressure stable ? ?

## 2011-02-14 NOTE — Assessment & Plan Note (Signed)
Resolved. 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

## 2011-02-14 NOTE — Assessment & Plan Note (Signed)
Fortunately not recurrent from perioperative setting. Stay off Amiodarone.

## 2011-02-14 NOTE — Assessment & Plan Note (Signed)
Status post CABG for multivessel disease. Continue medical therapy and observation with home health.

## 2011-02-15 ENCOUNTER — Other Ambulatory Visit (HOSPITAL_COMMUNITY): Payer: Managed Care, Other (non HMO)

## 2011-02-15 ENCOUNTER — Other Ambulatory Visit (HOSPITAL_COMMUNITY): Payer: Self-pay | Admitting: Oncology

## 2011-02-15 ENCOUNTER — Ambulatory Visit (HOSPITAL_COMMUNITY): Payer: Managed Care, Other (non HMO) | Admitting: Oncology

## 2011-02-15 ENCOUNTER — Ambulatory Visit (HOSPITAL_BASED_OUTPATIENT_CLINIC_OR_DEPARTMENT_OTHER): Payer: Managed Care, Other (non HMO)

## 2011-02-15 DIAGNOSIS — D696 Thrombocytopenia, unspecified: Secondary | ICD-10-CM

## 2011-02-15 DIAGNOSIS — R161 Splenomegaly, not elsewhere classified: Secondary | ICD-10-CM

## 2011-02-15 LAB — CBC WITH DIFFERENTIAL/PLATELET
BASO%: 0.4 % (ref 0.0–2.0)
Basophils Absolute: 0 10*3/uL (ref 0.0–0.1)
EOS%: 2.7 % (ref 0.0–7.0)
MCH: 30.1 pg (ref 25.1–34.0)
MCHC: 33.7 g/dL (ref 31.5–36.0)
MCV: 89.3 fL (ref 79.5–101.0)
MONO%: 7.3 % (ref 0.0–14.0)
RBC: 3.67 10*6/uL — ABNORMAL LOW (ref 3.70–5.45)
RDW: 14.1 % (ref 11.2–14.5)

## 2011-02-15 LAB — BASIC METABOLIC PANEL
BUN: 32 mg/dL — ABNORMAL HIGH (ref 6–23)
Potassium: 4.8 mEq/L (ref 3.5–5.3)
Sodium: 140 mEq/L (ref 135–145)

## 2011-02-17 ENCOUNTER — Other Ambulatory Visit: Payer: Self-pay | Admitting: Oncology

## 2011-02-18 LAB — BASIC METABOLIC PANEL
Glucose, Bld: 159 mg/dL — ABNORMAL HIGH (ref 70–99)
Potassium: 4.8 mEq/L (ref 3.5–5.3)
Sodium: 140 mEq/L (ref 135–145)

## 2011-02-18 LAB — SEROTONIN SERUM: Serotonin, Serum: <10 ng/mL — ABNORMAL LOW (ref 26–165)

## 2011-02-21 ENCOUNTER — Ambulatory Visit (INDEPENDENT_AMBULATORY_CARE_PROVIDER_SITE_OTHER): Payer: Self-pay | Admitting: Surgery

## 2011-02-21 ENCOUNTER — Encounter: Payer: Self-pay | Admitting: Surgery

## 2011-02-21 ENCOUNTER — Other Ambulatory Visit: Payer: Self-pay | Admitting: Cardiothoracic Surgery

## 2011-02-21 ENCOUNTER — Encounter: Payer: Self-pay | Admitting: *Deleted

## 2011-02-21 VITALS — BP 120/70 | HR 94 | Temp 97.0°F | Resp 16 | Ht 65.0 in | Wt 161.2 lb

## 2011-02-21 DIAGNOSIS — T148XXA Other injury of unspecified body region, initial encounter: Secondary | ICD-10-CM

## 2011-02-21 DIAGNOSIS — I2510 Atherosclerotic heart disease of native coronary artery without angina pectoris: Secondary | ICD-10-CM

## 2011-02-21 DIAGNOSIS — Z09 Encounter for follow-up examination after completed treatment for conditions other than malignant neoplasm: Secondary | ICD-10-CM

## 2011-02-21 NOTE — Progress Notes (Signed)
HPI:  The patient returns today for examination of  the lower portion of her sternal wound following coronary bypass graft surgery by Dr. Donata Clay  last month. She was last seen in the office on 02/12/2011 at which time she was noted to have some separation and drainage from the lower end of her chest incision. This was debrided and she was started on twice-daily wet-to-dry dressings. Since her last visit she denies any fever or chills. She has had some soreness along the lower portion of her chest and upper abdomen. She's also noticed some drainage from the wound.  Current Outpatient Prescriptions  Medication Sig Dispense Refill  . aspirin 325 MG tablet Take 325 mg by mouth daily.        . carvedilol (COREG) 3.125 MG tablet Take 1 tablet (3.125 mg total) by mouth 2 (two) times daily.  60 tablet  3  . folic acid (FOLVITE) 1 MG tablet Take 1 tablet (1 mg total) by mouth daily. For one month then may stop.      . furosemide (LASIX) 40 MG tablet Take 1 tablet (40 mg total) by mouth daily. For  30 tablet  0  . gabapentin (NEURONTIN) 300 MG capsule Take 300 mg by mouth 3 (three) times daily.        . insulin glargine (LANTUS) 100 UNIT/ML injection Inject 30 Units into the skin 2 (two) times daily. Patient to gradually increase her insulin glargine to pre op dose of 100 units [place] two times daily.  Contact medical doctor for any questions.  10 mL    . insulin lispro (HUMALOG) 100 UNIT/ML injection Inject 2 Units into the skin 3 (three) times daily before meals. Patient to gradually increase insulin lispro to the pre op dose of 84 units [place] three times daily.  Contact medical doctor for any questions.  10 mL    . metFORMIN (GLUCOPHAGE) 500 MG tablet Take 500 mg by mouth 2 (two) times daily with a meal.        . Multiple Vitamin (MULTIVITAMIN) tablet Take 1 tablet by mouth daily.      Marland Kitchen PARoxetine (PAXIL) 20 MG tablet Take 20 mg by mouth every morning.        . polysaccharide iron (NIFEREX) 150 MG CAPS capsule  Take 1 capsule (150 mg total) by mouth daily. For one month then may stop.  30 each  0  . potassium chloride (K-DUR,KLOR-CON) 10 MEQ tablet Take 1 tablet (10 mEq total) by mouth daily.  30 tablet  0  . simvastatin (ZOCOR) 20 MG tablet Take 20 mg by mouth at bedtime.           Physical Exam:  BP 120/70  Pulse 94  Temp(Src) 97 F (36.1 C) (Oral)  Resp 16  Ht 5\' 5"  (1.651 m)  Wt 161 lb 3.2 oz (73.12 kg)  BMI 26.83 kg/m2  SpO2 93%  LMP 01/05/1996  The sternotomy incision is intact for the most part except for the lower end where there is a small wound measuring approximately 1.5 cm in length. There is some fibrinous debris along the edges of the wound and no sign of granulation tissue yet. There is also slight separation above this area joined by a tiny bridge of skin and there is some drainage from this area. I joined these 2 areas and turned it into one wound. There was a few cc's of serous fluid that looked like fat necrosis. There is no sign of infection at  this time and no erythema. The sternum feels stable.  The wound was debrided of this fibrinous debris along the edges. A wet-to-dry dressing was applied.     Impression:  She has nonhealing and separation of the lower end of her sternotomy incision probably related to fat necrosis. There is no sign of active infection.  Plan:  We will continue twice-daily wet-to-dry dressings to the wound and plan to see her back next week for followup.

## 2011-02-23 ENCOUNTER — Other Ambulatory Visit (HOSPITAL_COMMUNITY): Payer: Self-pay | Admitting: Oncology

## 2011-02-24 LAB — 5 HIAA, QUANTITATIVE, URINE, 24 HOUR: 5-HIAA, 24 Hr Urine: 2.1 mg/24 h (ref <=6.0)

## 2011-02-28 ENCOUNTER — Ambulatory Visit
Admission: RE | Admit: 2011-02-28 | Discharge: 2011-02-28 | Disposition: A | Discharge status: Home / Self Care | Payer: 59 | Source: Ambulatory Visit | Attending: Cardiothoracic Surgery | Admitting: Cardiothoracic Surgery

## 2011-02-28 ENCOUNTER — Ambulatory Visit (INDEPENDENT_AMBULATORY_CARE_PROVIDER_SITE_OTHER): Payer: Self-pay | Admitting: Cardiothoracic Surgery

## 2011-02-28 ENCOUNTER — Encounter: Payer: Self-pay | Admitting: Cardiothoracic Surgery

## 2011-02-28 ENCOUNTER — Other Ambulatory Visit: Payer: Self-pay

## 2011-02-28 ENCOUNTER — Inpatient Hospital Stay (HOSPITAL_COMMUNITY)
Admission: AD | Admit: 2011-02-28 | Discharge: 2011-03-08 | DRG: 857 | Disposition: A | Discharge status: Home / Self Care | Payer: 59 | Source: Ambulatory Visit | Attending: Cardiothoracic Surgery | Admitting: Cardiothoracic Surgery

## 2011-02-28 ENCOUNTER — Encounter (HOSPITAL_COMMUNITY): Payer: Self-pay | Admitting: *Deleted

## 2011-02-28 VITALS — BP 102/61 | HR 98 | Resp 18 | Ht 65.0 in | Wt 161.0 lb

## 2011-02-28 DIAGNOSIS — E1142 Type 2 diabetes mellitus with diabetic polyneuropathy: Secondary | ICD-10-CM | POA: Diagnosis present

## 2011-02-28 DIAGNOSIS — K7689 Other specified diseases of liver: Secondary | ICD-10-CM | POA: Diagnosis present

## 2011-02-28 DIAGNOSIS — Z7982 Long term (current) use of aspirin: Secondary | ICD-10-CM

## 2011-02-28 DIAGNOSIS — E785 Hyperlipidemia, unspecified: Secondary | ICD-10-CM | POA: Diagnosis present

## 2011-02-28 DIAGNOSIS — T148XXA Other injury of unspecified body region, initial encounter: Secondary | ICD-10-CM

## 2011-02-28 DIAGNOSIS — I5022 Chronic systolic (congestive) heart failure: Secondary | ICD-10-CM | POA: Diagnosis present

## 2011-02-28 DIAGNOSIS — T8140XA Infection following a procedure, unspecified, initial encounter: Principal | ICD-10-CM | POA: Diagnosis present

## 2011-02-28 DIAGNOSIS — K5730 Diverticulosis of large intestine without perforation or abscess without bleeding: Secondary | ICD-10-CM | POA: Diagnosis present

## 2011-02-28 DIAGNOSIS — I200 Unstable angina: Secondary | ICD-10-CM

## 2011-02-28 DIAGNOSIS — Z794 Long term (current) use of insulin: Secondary | ICD-10-CM

## 2011-02-28 DIAGNOSIS — E1149 Type 2 diabetes mellitus with other diabetic neurological complication: Secondary | ICD-10-CM | POA: Diagnosis present

## 2011-02-28 DIAGNOSIS — I2510 Atherosclerotic heart disease of native coronary artery without angina pectoris: Secondary | ICD-10-CM

## 2011-02-28 DIAGNOSIS — F329 Major depressive disorder, single episode, unspecified: Secondary | ICD-10-CM | POA: Diagnosis present

## 2011-02-28 DIAGNOSIS — Z951 Presence of aortocoronary bypass graft: Secondary | ICD-10-CM

## 2011-02-28 DIAGNOSIS — Y832 Surgical operation with anastomosis, bypass or graft as the cause of abnormal reaction of the patient, or of later complication, without mention of misadventure at the time of the procedure: Secondary | ICD-10-CM | POA: Diagnosis present

## 2011-02-28 DIAGNOSIS — I10 Essential (primary) hypertension: Secondary | ICD-10-CM | POA: Diagnosis present

## 2011-02-28 DIAGNOSIS — R001 Bradycardia, unspecified: Secondary | ICD-10-CM

## 2011-02-28 DIAGNOSIS — F3289 Other specified depressive episodes: Secondary | ICD-10-CM | POA: Diagnosis present

## 2011-02-28 DIAGNOSIS — Z853 Personal history of malignant neoplasm of breast: Secondary | ICD-10-CM

## 2011-02-28 DIAGNOSIS — M129 Arthropathy, unspecified: Secondary | ICD-10-CM | POA: Diagnosis present

## 2011-02-28 LAB — BASIC METABOLIC PANEL
BUN: 25 mg/dL — ABNORMAL HIGH (ref 6–23)
CO2: 28 mEq/L (ref 19–32)
Calcium: 9.3 mg/dL (ref 8.4–10.5)
Chloride: 98 mEq/L (ref 96–112)
Creatinine, Ser: 0.96 mg/dL (ref 0.50–1.10)
GFR calc Af Amer: 72 mL/min — ABNORMAL LOW (ref >90)
GFR calc non Af Amer: 63 mL/min — ABNORMAL LOW (ref >90)
Glucose, Bld: 144 mg/dL — ABNORMAL HIGH (ref 70–99)
Potassium: 4.8 mEq/L (ref 3.5–5.1)
Sodium: 137 mEq/L (ref 135–145)

## 2011-02-28 LAB — CBC
HCT: 33.5 % — ABNORMAL LOW (ref 36.0–46.0)
Hemoglobin: 11 g/dL — ABNORMAL LOW (ref 12.0–15.0)
MCH: 28.7 pg (ref 26.0–34.0)
MCHC: 32.8 g/dL (ref 30.0–36.0)
MCV: 87.5 fL (ref 78.0–100.0)
Platelets: 163 10*3/uL (ref 150–400)
RBC: 3.83 MIL/uL — ABNORMAL LOW (ref 3.87–5.11)
RDW: 13.6 % (ref 11.5–15.5)
WBC: 6.1 10*3/uL (ref 4.0–10.5)

## 2011-02-28 LAB — URINALYSIS, ROUTINE W REFLEX MICROSCOPIC
Bilirubin Urine: NEGATIVE
Glucose, UA: NEGATIVE mg/dL
Ketones, ur: NEGATIVE mg/dL
Nitrite: POSITIVE — AB
Protein, ur: NEGATIVE mg/dL
Specific Gravity, Urine: 1.019 (ref 1.005–1.030)
Urobilinogen, UA: 1 mg/dL (ref 0.0–1.0)
pH: 5 (ref 5.0–8.0)

## 2011-02-28 LAB — GLUCOSE, CAPILLARY
Glucose-Capillary: 79 mg/dL (ref 70–99)
Glucose-Capillary: 178 mg/dL — ABNORMAL HIGH (ref 70–99)

## 2011-02-28 LAB — APTT: aPTT: 35 seconds (ref 24–37)

## 2011-02-28 LAB — URINE MICROSCOPIC-ADD ON

## 2011-02-28 LAB — DIFFERENTIAL
Basophils Absolute: 0 10*3/uL (ref 0.0–0.1)
Basophils Relative: 0 % (ref 0–1)
Eosinophils Absolute: 0.1 10*3/uL (ref 0.0–0.7)
Eosinophils Relative: 2 % (ref 0–5)
Lymphocytes Relative: 31 % (ref 12–46)
Lymphs Abs: 1.9 10*3/uL (ref 0.7–4.0)
Monocytes Absolute: 0.4 10*3/uL (ref 0.1–1.0)
Monocytes Relative: 7 % (ref 3–12)
Neutro Abs: 3.7 10*3/uL (ref 1.7–7.7)
Neutrophils Relative %: 60 % (ref 43–77)

## 2011-02-28 LAB — PROTIME-INR
INR: 1.11 (ref 0.00–1.49)
Prothrombin Time: 14.5 seconds (ref 11.6–15.2)

## 2011-02-28 LAB — SURGICAL PCR SCREEN
MRSA, PCR: NEGATIVE
Staphylococcus aureus: NEGATIVE

## 2011-02-28 LAB — TYPE AND SCREEN
ABO/RH(D): B POS
Antibody Screen: NEGATIVE

## 2011-02-28 MED ORDER — SIMVASTATIN 20 MG PO TABS
20.0000 mg | ORAL_TABLET | Freq: Every day | ORAL | Status: DC
  Administered 2011-02-28 – 2011-03-07 (×7): 20 mg via ORAL
  Filled 2011-02-28 (×9): qty 1

## 2011-02-28 MED ORDER — PAROXETINE HCL 20 MG PO TABS
20.0000 mg | ORAL_TABLET | ORAL | Status: DC
  Administered 2011-03-01 – 2011-03-08 (×8): 20 mg via ORAL
  Filled 2011-02-28 (×10): qty 1

## 2011-02-28 MED ORDER — GABAPENTIN 300 MG PO CAPS
300.0000 mg | ORAL_CAPSULE | Freq: Three times a day (TID) | ORAL | Status: DC
  Administered 2011-02-28 – 2011-03-08 (×21): 300 mg via ORAL
  Filled 2011-02-28 (×26): qty 1

## 2011-02-28 MED ORDER — METFORMIN HCL 500 MG PO TABS
500.0000 mg | ORAL_TABLET | Freq: Two times a day (BID) | ORAL | Status: DC
  Administered 2011-02-28 – 2011-03-07 (×11): 500 mg via ORAL
  Filled 2011-02-28 (×18): qty 1

## 2011-02-28 MED ORDER — CARVEDILOL 3.125 MG PO TABS
3.1250 mg | ORAL_TABLET | Freq: Two times a day (BID) | ORAL | Status: DC
  Administered 2011-02-28 – 2011-03-07 (×14): 3.125 mg via ORAL
  Filled 2011-02-28 (×18): qty 1

## 2011-02-28 MED ORDER — ASPIRIN 325 MG PO TABS
325.0000 mg | ORAL_TABLET | Freq: Every day | ORAL | Status: DC
  Filled 2011-02-28 (×2): qty 1

## 2011-02-28 MED ORDER — INSULIN ASPART 100 UNIT/ML ~~LOC~~ SOLN
3.0000 [IU] | Freq: Three times a day (TID) | SUBCUTANEOUS | Status: AC
  Administered 2011-03-03 – 2011-03-06 (×7): 3 [IU] via SUBCUTANEOUS
  Filled 2011-02-28: qty 3

## 2011-02-28 MED ORDER — VANCOMYCIN HCL IN DEXTROSE 1-5 GM/200ML-% IV SOLN
1000.0000 mg | Freq: Two times a day (BID) | INTRAVENOUS | Status: AC
  Administered 2011-02-28 – 2011-03-04 (×8): 1000 mg via INTRAVENOUS
  Filled 2011-02-28 (×9): qty 200

## 2011-02-28 MED ORDER — INSULIN GLARGINE 100 UNIT/ML ~~LOC~~ SOLN
40.0000 [IU] | Freq: Two times a day (BID) | SUBCUTANEOUS | Status: DC
  Administered 2011-02-28 – 2011-03-08 (×13): 40 [IU] via SUBCUTANEOUS
  Filled 2011-02-28 (×2): qty 3

## 2011-02-28 MED ORDER — ASPIRIN 325 MG PO TABS: 325.0000 mg | ORAL_TABLET | Freq: Every day | ORAL | Status: DC

## 2011-02-28 MED ORDER — HYDROCODONE-ACETAMINOPHEN 5-325 MG PO TABS
1.0000 | ORAL_TABLET | ORAL | Status: DC | PRN
  Administered 2011-03-01 – 2011-03-06 (×7): 2 via ORAL
  Filled 2011-02-28 (×7): qty 2

## 2011-02-28 MED ORDER — ONE-DAILY MULTI VITAMINS PO TABS: 1.0000 | ORAL_TABLET | Freq: Every day | ORAL | Status: DC

## 2011-02-28 MED ORDER — FOLIC ACID 1 MG PO TABS
1.0000 mg | ORAL_TABLET | Freq: Every day | ORAL | Status: DC
  Administered 2011-03-03 – 2011-03-05 (×2): 1 mg via ORAL
  Filled 2011-02-28 (×8): qty 1

## 2011-02-28 MED ORDER — ACETAMINOPHEN 325 MG PO TABS: 650.0000 mg | ORAL_TABLET | Freq: Four times a day (QID) | ORAL | Status: DC | PRN

## 2011-02-28 MED ORDER — CEFUROXIME SODIUM 1.5 G IJ SOLR: 1.5000 g | Freq: Once | INTRAMUSCULAR | Status: DC

## 2011-02-28 MED ORDER — ASPIRIN 81 MG PO TBEC: 325.0000 mg | DELAYED_RELEASE_TABLET | Freq: Every day | ORAL | Status: DC

## 2011-02-28 MED ORDER — SODIUM CHLORIDE 0.9 % IV SOLN
INTRAVENOUS | Status: AC
  Administered 2011-02-28 – 2011-03-02 (×3): via INTRAVENOUS

## 2011-02-28 MED ORDER — TRAMADOL HCL 50 MG PO TABS: 50.0000 mg | ORAL_TABLET | Freq: Four times a day (QID) | ORAL | Status: DC | PRN

## 2011-02-28 MED ORDER — LORAZEPAM 1 MG PO TABS: 1.0000 mg | ORAL_TABLET | Freq: Four times a day (QID) | ORAL | Status: DC | PRN

## 2011-02-28 MED ORDER — ADULT MULTIVITAMIN W/MINERALS CH
1.0000 | ORAL_TABLET | Freq: Every day | ORAL | Status: DC
  Administered 2011-03-02 – 2011-03-08 (×7): 1 via ORAL
  Filled 2011-02-28 (×8): qty 1

## 2011-02-28 NOTE — Patient Instructions (Signed)
We will admit you for wound care of the sternum

## 2011-02-28 NOTE — Progress Notes (Signed)
[address]            [name] [address]          [phone]      HPI: [name] returns for followup after undergoing multi-vessel bypass grafting approximately 8 weeks ago. She is a brittle diabetic status post right mastectomy and having had radiation therapy. She is readmitted to the hospital in [place] for diuresis in the late December but is now back home. She has no current symptoms of CHF or angina. She has no fever. Over the sternal incision has separated and there is a space undermining incision up to the upper part of the incision. She denies fever. She has had no antibiotics and home health nursing has not been doing a wound care. It appears to be a superficial sternal infection. Her diabetes is been fairly well controlled now on split doses of Lantus 40 twice a day and meal coverage with Humalog. The leg incision is well-healed.  Current Outpatient Prescriptions  Medication Sig Dispense Refill  . aspirin 325 MG tablet Take 325 mg by mouth daily.        . carvedilol (COREG) 3.125 MG tablet Take 1 tablet (3.125 mg total) by mouth 2 (two) times daily.  60 tablet  3  . folic acid (FOLVITE) 1 MG tablet Take 1 tablet (1 mg total) by mouth daily. For one month then may stop.      Marland Kitchen gabapentin (NEURONTIN) 300 MG capsule Take 300 mg by mouth 3 (three) times daily.        . insulin glargine (LANTUS) 100 UNIT/ML injection Inject 40 Units into the skin 2 (two) times daily. Patient to gradually increase her insulin glargine to pre op dose of 100 units [place] two times daily.  Contact medical doctor for any questions.       . insulin lispro (HUMALOG) 100 UNIT/ML injection Inject 20 Units into the skin 3 (three) times daily before meals. Patient to gradually increase insulin lispro to the pre op dose of 84 units  three times daily.  Contact medical doctor for any questions.       . metFORMIN (GLUCOPHAGE) 500 MG tablet Take 500 mg by mouth 2 (two) times daily  with a meal.        . Multiple Vitamin (MULTIVITAMIN) tablet Take 1 tablet by mouth daily.      Marland Kitchen PARoxetine (PAXIL) 20 MG tablet Take 20 mg by mouth every morning.        . simvastatin (ZOCOR) 20 MG tablet Take 20 mg by mouth at bedtime.           Physical Exam: She vital signs blood pressure 102/60 pulse 98 and regular saturation 97% weight 161 height 5 foot 5 General appearance middle-aged Caucasian female no acute distress Breath sounds clear and equal slight diminished at the left base Cardiac rhythm regular without murmur Sternal incision with lower skin separation and a tunnel undermining the mid to upper part incision. There is some purulent material. 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 The patient need to be admitted the hospital for debridement and probable wound VAC and IV antibiotics. We'll culture the incision in the office  Plan: Admit for inpatient care of the  superficial sternal infection.

## 2011-02-28 NOTE — H&P (Signed)
[name] is an 61 y.o. female.                         [address]            [name] [id number]          [phone]      Chief Complaint: Open sternal wound following CABG November 2012 HPI: Patient is a 61 year old diabetic status post mastectomy for cancer with urgent multivessel bypass grafting proximally 8 weeks ago. She presents to the office for a routine followup. Clinically she is doing well except for a open sternal wound which appears to be superficial wound infection. She denies fever. She denies angina or symptoms of CHF. She has not been taking any antibiotics. Home health nursing is not been performing wound care  Past Medical History  Diagnosis Date  . Coronary atherosclerosis of native coronary artery     BMS circ 2001, DES LAD 2005, DES LAD and RCA 2008, subsequent CABG  . Drug allergy     Allergy to Plavix (hives) - took Ticlid in past  . Essential hypertension, benign   . Mixed hyperlipidemia   . Type 2 diabetes mellitus   . Breast cancer     Chemo and mastectomy  . Myocardial infarction ~ 2002  . Arthritis     rheumatoid  . Depression   . Peripheral neuropathy   . Peptic ulcer disease ~ 1975  . Nephrolithiasis 02/07/2011    Past Surgical History  Procedure Date  . Mastectomy 1988    right  . Cholecystectomy 1969  . Abdominal hysterectomy 1989  . Tonsillectomy and adenoidectomy 1979  . Appendectomy 1969  . Tubal ligation 1972  . Coronary artery bypass graft 01/11/2011    Procedure: CORONARY ARTERY BYPASS GRAFTING (CABG);  Surgeon: [name] Nations Suann Larry, MD;  Location: [facility] OR;  Service: Open Heart Surgery;  Laterality: N/A;  cabg x four, using left internal mammary artery and right leg greater saphenous vein harvestede endoscopically    No family history on file. Social History:  reports that she quit smoking about 4 years ago. Her smoking use included Cigarettes. She has a 20 pack-year smoking history. She has never used  smokeless tobacco. She reports that she does not drink alcohol or use illicit drugs.  Allergies:  Allergies  Allergen Reactions  . Clopidogrel Bisulfate Hives  . Codeine Nausea And Vomiting  . Morphine Nausea And Vomiting    Medications Prior to Admission  Medication Sig Dispense Refill  . aspirin 325 MG tablet Take 325 mg by mouth daily.        . carvedilol (COREG) 3.125 MG tablet Take 1 tablet (3.125 mg total) by mouth 2 (two) times daily.  60 tablet  3  . folic acid (FOLVITE) 1 MG tablet Take 1 tablet (1 mg total) by mouth daily. For one month then may stop.      Marland Kitchen gabapentin (NEURONTIN) 300 MG capsule Take 300 mg by mouth 3 (three) times daily.        . insulin glargine (LANTUS) 100 UNIT/ML injection Inject 40 Units into the skin 2 (two) times daily. Patient to gradually increase her insulin glargine to pre op dose of 100 units [place] two times daily.  Contact medical doctor for any questions.       . insulin lispro (HUMALOG) 100 UNIT/ML injection Inject 20 Units into the skin 3 (three) times daily before meals. Patient to  gradually increase insulin lispro to the pre op dose of 84 units [place] three times daily.  Contact medical doctor for any questions.       . metFORMIN (GLUCOPHAGE) 500 MG tablet Take 500 mg by mouth 2 (two) times daily with a meal.        . Multiple Vitamin (MULTIVITAMIN) tablet Take 1 tablet by mouth daily.      Marland Kitchen PARoxetine (PAXIL) 20 MG tablet Take 20 mg by mouth every morning.        . simvastatin (ZOCOR) 20 MG tablet Take 20 mg by mouth at bedtime.         No current facility-administered medications on file as of 02/28/2011.    No results found for this or any previous visit (from the past 48 hour(s)). Dg Chest 2 View  02/28/2011  *RADIOLOGY REPORT*  Clinical Data: Follow-up CABG  CHEST - 2 VIEW  Comparison: 02/12/2011  Findings: Previous median sternotomy and CABG.  Heart is at the upper limits of normal in size.  There are bilateral pleural effusions, more on the left  than the right, with mild basilar atelectasis.  This is improved since previous exam.  No worsening or new findings.  IMPRESSION: Smaller effusions and less basilar atelectasis.  Original Report Authenticated By: [name], M.D.    ROS she's been afebrile. Her weight has been stable at approximately 160 pounds at home. Her peripheral edema has resolved. Her EF following surgery is approximately 35-40%.  Blood pressure 102/61, pulse 98, resp. rate 18, height 5\' 5"  (1.651 m), weight 161 lb (73.029 kg), last menstrual period 01/05/1996, SpO2 97.00%. Physical Exam  General appearance alert and oriented without distress HEENT normocephalic Chest with a open lower sternal in decision with a subcutaneous defect up to the mid to upper sternal incision which needs to be treated and opened Cardiac rhythm regular without gallop Bowel soft Extremities without edema or tenderness vein harvest site healed Neurologic exam intact Assessment/Plan 61 year old diabetic with superficial sternal infection needs to be admitted for IV antibiotics debridement and a wound VAC placement in the OR tomorrow.  [name],[name] 02/28/2011, 3:09 PM

## 2011-03-01 ENCOUNTER — Encounter (HOSPITAL_COMMUNITY)
Admission: AD | Disposition: A | Discharge status: Home / Self Care | Payer: Self-pay | Source: Ambulatory Visit | Attending: Cardiothoracic Surgery

## 2011-03-01 ENCOUNTER — Inpatient Hospital Stay: Admit: 2011-03-01 | Payer: Self-pay | Admitting: Cardiothoracic Surgery

## 2011-03-01 ENCOUNTER — Inpatient Hospital Stay (HOSPITAL_COMMUNITY): Payer: 59 | Admitting: Certified Registered Nurse Anesthetist

## 2011-03-01 ENCOUNTER — Encounter (HOSPITAL_COMMUNITY): Payer: Self-pay | Admitting: Certified Registered Nurse Anesthetist

## 2011-03-01 DIAGNOSIS — L02219 Cutaneous abscess of trunk, unspecified: Secondary | ICD-10-CM

## 2011-03-01 DIAGNOSIS — L03319 Cellulitis of trunk, unspecified: Secondary | ICD-10-CM

## 2011-03-01 DIAGNOSIS — I519 Heart disease, unspecified: Secondary | ICD-10-CM

## 2011-03-01 HISTORY — PX: APPLICATION OF WOUND VAC: SHX5189

## 2011-03-01 HISTORY — PX: STERNAL WOUND DEBRIDEMENT: SHX1058

## 2011-03-01 LAB — GLUCOSE, CAPILLARY
Glucose-Capillary: 137 mg/dL — ABNORMAL HIGH (ref 70–99)
Glucose-Capillary: 162 mg/dL — ABNORMAL HIGH (ref 70–99)
Glucose-Capillary: 109 mg/dL — ABNORMAL HIGH (ref 70–99)
Glucose-Capillary: 110 mg/dL — ABNORMAL HIGH (ref 70–99)

## 2011-03-01 LAB — HEMOGLOBIN A1C
Hgb A1c MFr Bld: 6.2 % — ABNORMAL HIGH (ref <5.7)
Mean Plasma Glucose: 131 mg/dL — ABNORMAL HIGH (ref <117)

## 2011-03-01 LAB — GRAM STAIN

## 2011-03-01 SURGERY — DEBRIDEMENT, WOUND, STERNUM
Anesthesia: General | Site: Chest | Wound class: Dirty or Infected

## 2011-03-01 MED ORDER — CEFUROXIME SODIUM 1.5 G IJ SOLR
1.5000 g | Freq: Two times a day (BID) | INTRAMUSCULAR | Status: DC
  Filled 2011-03-01: qty 1.5

## 2011-03-01 MED ORDER — MEPERIDINE HCL 25 MG/ML IJ SOLN: 6.2500 mg | INTRAMUSCULAR | Status: DC | PRN

## 2011-03-01 MED ORDER — LACTATED RINGERS IV SOLN
INTRAVENOUS | Status: DC | PRN
  Administered 2011-03-01 (×2): via INTRAVENOUS

## 2011-03-01 MED ORDER — ROCURONIUM BROMIDE 100 MG/10ML IV SOLN
INTRAVENOUS | Status: DC | PRN
  Administered 2011-03-01: 40 mg via INTRAVENOUS

## 2011-03-01 MED ORDER — ONDANSETRON HCL 4 MG/2ML IJ SOLN
INTRAMUSCULAR | Status: DC | PRN
  Administered 2011-03-01: 4 mg via INTRAVENOUS

## 2011-03-01 MED ORDER — PROPOFOL 10 MG/ML IV EMUL
INTRAVENOUS | Status: DC | PRN
  Administered 2011-03-01: 150 mg via INTRAVENOUS

## 2011-03-01 MED ORDER — NEOSTIGMINE METHYLSULFATE 1 MG/ML IJ SOLN
INTRAMUSCULAR | Status: DC | PRN
  Administered 2011-03-01: 3 mg via INTRAVENOUS

## 2011-03-01 MED ORDER — FENTANYL CITRATE 0.05 MG/ML IJ SOLN
INTRAMUSCULAR | Status: DC | PRN
  Administered 2011-03-01: 100 ug via INTRAVENOUS

## 2011-03-01 MED ORDER — SODIUM CHLORIDE 0.9 % IR SOLN
Status: DC | PRN
  Administered 2011-03-01: 3000 mL

## 2011-03-01 MED ORDER — HYDROMORPHONE HCL PF 1 MG/ML IJ SOLN
0.2500 mg | INTRAMUSCULAR | Status: DC | PRN
  Administered 2011-03-01 – 2011-03-06 (×5): 0.5 mg via INTRAVENOUS
  Filled 2011-03-01 (×4): qty 1

## 2011-03-01 MED ORDER — DEXTROSE 5 % IV SOLN
1.5000 g | INTRAVENOUS | Status: DC | PRN
  Administered 2011-03-01: 1.5 g via INTRAVENOUS

## 2011-03-01 MED ORDER — ASPIRIN 325 MG PO TABS
325.0000 mg | ORAL_TABLET | Freq: Every day | ORAL | Status: DC
  Administered 2011-03-02 – 2011-03-08 (×7): 325 mg via ORAL
  Filled 2011-03-01 (×8): qty 1

## 2011-03-01 MED ORDER — SODIUM CHLORIDE 0.9 % IV SOLN
10.0000 mg | INTRAVENOUS | Status: DC | PRN
  Administered 2011-03-01: 80 ug/min via INTRAVENOUS

## 2011-03-01 MED ORDER — ONDANSETRON HCL 4 MG/2ML IJ SOLN: 4.0000 mg | Freq: Once | INTRAMUSCULAR | Status: AC | PRN

## 2011-03-01 MED ORDER — PHENYLEPHRINE HCL 10 MG/ML IJ SOLN
INTRAMUSCULAR | Status: DC | PRN
  Administered 2011-03-01: 120 ug via INTRAVENOUS

## 2011-03-01 MED ORDER — GLYCOPYRROLATE 0.2 MG/ML IJ SOLN
INTRAMUSCULAR | Status: DC | PRN
  Administered 2011-03-01: .5 mg via INTRAVENOUS

## 2011-03-01 MED ORDER — MIDAZOLAM HCL 5 MG/5ML IJ SOLN
INTRAMUSCULAR | Status: DC | PRN
  Administered 2011-03-01: 2 mg via INTRAVENOUS

## 2011-03-01 MED ORDER — DEXTROSE 5 % IV SOLN
1.5000 g | Freq: Two times a day (BID) | INTRAVENOUS | Status: DC
  Administered 2011-03-02: 1.5 g via INTRAVENOUS
  Filled 2011-03-01 (×3): qty 1.5

## 2011-03-01 MED ORDER — INSULIN ASPART 100 UNIT/ML ~~LOC~~ SOLN
0.0000 [IU] | Freq: Three times a day (TID) | SUBCUTANEOUS | Status: DC
  Administered 2011-03-03: 4 [IU] via SUBCUTANEOUS
  Administered 2011-03-05 – 2011-03-06 (×2): 3 [IU] via SUBCUTANEOUS
  Filled 2011-03-01: qty 3

## 2011-03-01 MED ORDER — INSULIN ASPART 100 UNIT/ML ~~LOC~~ SOLN: 0.0000 [IU] | Freq: Every day | SUBCUTANEOUS | Status: DC

## 2011-03-01 SURGICAL SUPPLY — 58 items
ATTRACTOMAT 16X20 MAGNETIC DRP (DRAPES) IMPLANT
BAG DECANTER FOR FLEXI CONT (MISCELLANEOUS) IMPLANT
BAG URIMETER 350ML BARDEX IC (UROLOGICAL SUPPLIES)
BAG URIMETER BARDEX IC 350 (UROLOGICAL SUPPLIES) IMPLANT
BANDAGE GAUZE ELAST BULKY 4 IN (GAUZE/BANDAGES/DRESSINGS) IMPLANT
BENZOIN TINCTURE PRP APPL 2/3 (GAUZE/BANDAGES/DRESSINGS) IMPLANT
BLADE SURG 10 STRL SS (BLADE) ×6 IMPLANT
CANISTER SUCTION 2500CC (MISCELLANEOUS) ×3 IMPLANT
CATH FOLEY 2WAY SLVR  5CC 16FR (CATHETERS)
CATH FOLEY 2WAY SLVR 5CC 16FR (CATHETERS) IMPLANT
CATH THORACIC 28FR RT ANG (CATHETERS) IMPLANT
CATH THORACIC 36FR (CATHETERS) IMPLANT
CATH THORACIC 36FR RT ANG (CATHETERS) IMPLANT
CLIP TI WIDE RED SMALL 24 (CLIP) IMPLANT
CLOTH BEACON ORANGE TIMEOUT ST (SAFETY) ×3 IMPLANT
CONN Y 3/8X3/8X3/8  BEN (MISCELLANEOUS)
CONN Y 3/8X3/8X3/8 BEN (MISCELLANEOUS) IMPLANT
CONT SPEC 4OZ CLIKSEAL STRL BL (MISCELLANEOUS) IMPLANT
COVER SURGICAL LIGHT HANDLE (MISCELLANEOUS) ×6 IMPLANT
DRAPE LAPAROSCOPIC ABDOMINAL (DRAPES) ×3 IMPLANT
DRAPE WARM FLUID 44X44 (DRAPE) ×3 IMPLANT
DRSG PAD ABDOMINAL 8X10 ST (GAUZE/BANDAGES/DRESSINGS) IMPLANT
DRSG VAC ATS LRG SENSATRAC (GAUZE/BANDAGES/DRESSINGS) ×6 IMPLANT
ELECT REM PT RETURN 9FT ADLT (ELECTROSURGICAL) ×3
ELECTRODE REM PT RTRN 9FT ADLT (ELECTROSURGICAL) ×1 IMPLANT
GAUZE XEROFORM 5X9 LF (GAUZE/BANDAGES/DRESSINGS) IMPLANT
GLOVE BIO SURGEON STRL SZ7.5 (GLOVE) ×6 IMPLANT
GOWN STRL NON-REIN LRG LVL3 (GOWN DISPOSABLE) ×6 IMPLANT
HANDPIECE INTERPULSE COAX TIP (DISPOSABLE) ×2
HEMOSTAT POWDER SURGIFOAM 1G (HEMOSTASIS) IMPLANT
HEMOSTAT SURGICEL 2X14 (HEMOSTASIS) IMPLANT
KIT BASIN OR (CUSTOM PROCEDURE TRAY) ×3 IMPLANT
KIT ROOM TURNOVER OR (KITS) ×3 IMPLANT
KIT SUCTION CATH 14FR (SUCTIONS) IMPLANT
NS IRRIG 1000ML POUR BTL (IV SOLUTION) ×3 IMPLANT
PACK CHEST (CUSTOM PROCEDURE TRAY) ×3 IMPLANT
PAD ARMBOARD 7.5X6 YLW CONV (MISCELLANEOUS) ×6 IMPLANT
PAD NEG PRESSURE SENSATRAC (MISCELLANEOUS) ×3 IMPLANT
SET HNDPC FAN SPRY TIP SCT (DISPOSABLE) ×1 IMPLANT
SOLUTION BETADINE 4OZ (MISCELLANEOUS) IMPLANT
SPONGE GAUZE 4X4 12PLY (GAUZE/BANDAGES/DRESSINGS) IMPLANT
SPONGE LAP 18X18 X RAY DECT (DISPOSABLE) ×3 IMPLANT
STAPLER VISISTAT 35W (STAPLE) IMPLANT
STRAP MONTGOMERY 1.25X11-1/8 (MISCELLANEOUS) IMPLANT
SUT ETHILON 3 0 FSL (SUTURE) IMPLANT
SUT STEEL 6MS V (SUTURE) IMPLANT
SUT STEEL STERNAL CCS#1 18IN (SUTURE) IMPLANT
SUT STEEL SZ 6 DBL 3X14 BALL (SUTURE) IMPLANT
SUT VIC AB 1 CTX 36 (SUTURE)
SUT VIC AB 1 CTX36XBRD ANBCTR (SUTURE) IMPLANT
SUT VIC AB 2-0 CTX 27 (SUTURE) IMPLANT
SUT VIC AB 3-0 X1 27 (SUTURE) IMPLANT
SWAB COLLECTION DEVICE MRSA (MISCELLANEOUS) ×3 IMPLANT
SYR 5ML LL (SYRINGE) IMPLANT
TOWEL OR 17X24 6PK STRL BLUE (TOWEL DISPOSABLE) ×3 IMPLANT
TOWEL OR 17X26 10 PK STRL BLUE (TOWEL DISPOSABLE) ×3 IMPLANT
TUBE ANAEROBIC SPECIMEN COL (MISCELLANEOUS) ×3 IMPLANT
WATER STERILE IRR 1000ML POUR (IV SOLUTION) ×3 IMPLANT

## 2011-03-01 NOTE — Anesthesia Procedure Notes (Signed)
Procedure Name: Intubation Date/Time: 03/01/2011 4:55 PM Performed by: Shirlyn Goltz, TOM Pre-anesthesia Checklist: Patient identified, Emergency Drugs available, Suction available, Patient being monitored and Timeout performed Patient Re-evaluated:Patient Re-evaluated prior to inductionOxygen Delivery Method: Circle System Utilized Preoxygenation: Pre-oxygenation with 100% oxygen Intubation Type: IV induction Ventilation: Mask ventilation without difficulty Laryngoscope Size: Mac and 4 Grade View: Grade I Tube type: Oral Tube size: 7.5 mm Airway Equipment and Method: stylet Placement Confirmation: ETT inserted through vocal cords under direct vision,  positive ETCO2 and breath sounds checked- equal and bilateral Secured at: 21 cm Tube secured with: Tape Dental Injury: Teeth and Oropharynx as per pre-operative assessment

## 2011-03-01 NOTE — Transfer of Care (Signed)
Immediate Anesthesia Transfer of Care Note  Patient: [name]  Procedure(s) Performed:  STERNAL WOUND DEBRIDEMENT; APPLICATION OF WOUND VAC  Patient Location: PACU  Anesthesia Type: General  Level of Consciousness: awake, alert , oriented and patient cooperative  Airway & Oxygen Therapy: Patient Spontanous Breathing and Patient connected to nasal cannula oxygen  Post-op Assessment: Report given to PACU RN, Post -op Vital signs reviewed and stable and Patient moving all extremities X 4  Post vital signs: Reviewed and stable  Complications: No apparent anesthesia complications

## 2011-03-01 NOTE — Pre-Procedure Instructions (Addendum)
The patient was examined and preop studies reviewed. There has been no change from the prior exam and the patient is ready for surgery.   Procedure,risks and benefits discussed

## 2011-03-01 NOTE — Progress Notes (Signed)
  Echocardiogram 2D Echocardiogram has been performed.  [name] F 03/01/2011, 10:14 AM

## 2011-03-01 NOTE — Preoperative (Signed)
Beta Blockers   Reason not to administer Beta Blockers:Not Applicable 

## 2011-03-01 NOTE — Brief Op Note (Signed)
02/28/2011 - 03/01/2011  6:17 PM  PATIENT:  [name]  61 y.o. female  PRE-OPERATIVE DIAGNOSIS:  sternal wound infection  POST-OPERATIVE DIAGNOSIS:  sternal wound superficial infection  PROCEDURE:  Procedure(s): STERNAL WOUND DEBRIDEMENT APPLICATION OF WOUND VAC  SURGEON:  Surgeon(s): Kathlee Nations Trigt III, MD  PHYSICIAN ASSISTANT:      ANESTHESIA:  GEN  EBL:  Total I/O In: 1000 [I.V.:1000] Out: 75 [Blood:75]    DRAINS: wound vac to suction               Delay start of Pharmacological VTE agent (>24hrs) due to surgical blood loss or risk of bleeding yes

## 2011-03-01 NOTE — Anesthesia Postprocedure Evaluation (Signed)
Anesthesia Post Note  Patient: [name]  Procedure(s) Performed:  STERNAL WOUND DEBRIDEMENT; APPLICATION OF WOUND VAC  Anesthesia type: general  Patient location: PACU  Post pain: Pain level controlled  Post assessment: Patient's Cardiovascular Status Stable  Last Vitals:  Filed Vitals:   03/01/11 2010  BP: 101/56  Pulse: 89  Temp: 36.7 C  Resp: 18    Post vital signs: Reviewed and stable  Level of consciousness: sedated  Complications: No apparent anesthesia complications

## 2011-03-01 NOTE — Anesthesia Preprocedure Evaluation (Addendum)
Anesthesia Evaluation  Patient identified by MRN, date of birth, ID band Patient awake, Patient confused and Patient unresponsive    Reviewed: Allergy & Precautions, H&P , NPO status   Airway Mallampati: II TM Distance: >3 FB Neck ROM: Full    Dental  (+) Edentulous Upper, Edentulous Lower and Dental Advisory Given   Pulmonary  clear to auscultation        Cardiovascular Regular Normal    Neuro/Psych    GI/Hepatic   Endo/Other  Diabetes mellitus-, Type 2, Insulin Dependent and Oral Hypoglycemic Agents  Renal/GU      Musculoskeletal  (+) Arthritis -, Osteoarthritis,    Abdominal   Peds  Hematology   Anesthesia Other Findings   Reproductive/Obstetrics                          Anesthesia Physical Anesthesia Plan  ASA: III  Anesthesia Plan: General   Post-op Pain Management:    Induction: Intravenous  Airway Management Planned: Oral ETT  Additional Equipment:   Intra-op Plan:   Post-operative Plan: Extubation in OR  Informed Consent: I have reviewed the patients History and Physical, chart, labs and discussed the procedure including the risks, benefits and alternatives for the proposed anesthesia with the patient or authorized representative who has indicated his/her understanding and acceptance.   Dental advisory given  Plan Discussed with: CRNA, Surgeon and Anesthesiologist  Anesthesia Plan Comments:        Anesthesia Quick Evaluation

## 2011-03-02 ENCOUNTER — Encounter (HOSPITAL_COMMUNITY): Payer: Self-pay | Admitting: Cardiothoracic Surgery

## 2011-03-02 LAB — GLUCOSE, CAPILLARY
Glucose-Capillary: 88 mg/dL (ref 70–99)
Glucose-Capillary: 90 mg/dL (ref 70–99)
Glucose-Capillary: 67 mg/dL — ABNORMAL LOW (ref 70–99)
Glucose-Capillary: 124 mg/dL — ABNORMAL HIGH (ref 70–99)
Glucose-Capillary: 175 mg/dL — ABNORMAL HIGH (ref 70–99)

## 2011-03-02 LAB — BASIC METABOLIC PANEL
BUN: 14 mg/dL (ref 6–23)
CO2: 27 mEq/L (ref 19–32)
Calcium: 8.8 mg/dL (ref 8.4–10.5)
Chloride: 103 mEq/L (ref 96–112)
Creatinine, Ser: 0.83 mg/dL (ref 0.50–1.10)
GFR calc Af Amer: 86 mL/min — ABNORMAL LOW (ref >90)
GFR calc non Af Amer: 75 mL/min — ABNORMAL LOW (ref >90)
Glucose, Bld: 89 mg/dL (ref 70–99)
Potassium: 4.3 mEq/L (ref 3.5–5.1)
Sodium: 137 mEq/L (ref 135–145)

## 2011-03-02 LAB — CBC
HCT: 29 % — ABNORMAL LOW (ref 36.0–46.0)
Hemoglobin: 9.4 g/dL — ABNORMAL LOW (ref 12.0–15.0)
MCH: 28.5 pg (ref 26.0–34.0)
MCHC: 32.4 g/dL (ref 30.0–36.0)
MCV: 87.9 fL (ref 78.0–100.0)
Platelets: 151 10*3/uL (ref 150–400)
RBC: 3.3 MIL/uL — ABNORMAL LOW (ref 3.87–5.11)
RDW: 13.6 % (ref 11.5–15.5)
WBC: 5.1 10*3/uL (ref 4.0–10.5)

## 2011-03-02 MED ORDER — ENOXAPARIN SODIUM 40 MG/0.4ML ~~LOC~~ SOLN
40.0000 mg | SUBCUTANEOUS | Status: DC
  Administered 2011-03-02 – 2011-03-07 (×7): 40 mg via SUBCUTANEOUS
  Filled 2011-03-02 (×7): qty 0.4

## 2011-03-02 MED ORDER — CEFTAZIDIME 1 G IJ SOLR
1.0000 g | Freq: Three times a day (TID) | INTRAMUSCULAR | Status: DC
  Administered 2011-03-02 – 2011-03-08 (×19): 1 g via INTRAVENOUS
  Filled 2011-03-02 (×23): qty 1

## 2011-03-02 NOTE — Progress Notes (Signed)
CARDIAC REHAB PHASE I   PRE:  Rate/Rhythm: 78SR  BP:  Supine:   Sitting: 92/45  Standing:    SaO2: 94%RA  MODE:  Ambulation: 150 ft   POST:  Rate/Rhythem: 88SR  BP:  Supine:   Sitting: 107/56  Standing:    SaO2: 93%RA 1102-1122 Pt sleepy from pain med. Walked 150 ft on RA with handheld asst. Denied dizziness. Tolerated well for first walk. To sitting on side of bed after walk.  Duanne Limerick

## 2011-03-02 NOTE — Op Note (Signed)
NAMEALIS, [name]              ACCOUNT NO.:  [id number]  MEDICAL RECORD NO.:  [id number]  LOCATION:  2016                         FACILITY:  [facility]  PHYSICIAN:  Kerin Perna, M.D.  DATE OF BIRTH:  11/19/1950  DATE OF PROCEDURE:  03/01/2011 DATE OF DISCHARGE:                              OPERATIVE REPORT   OPERATION:  Sternal debridement and placement of wound VAC drainage system.  SURGEON:  Kerin Perna, M.D.  PREOPERATIVE DIAGNOSES:  Superficial sternal wound infection status post coronary artery bypass graft, history of severe diabetes, and previous mastectomy.  POSTOPERATIVE DIAGNOSES:  Superficial sternal wound infection status post coronary artery bypass graft, history of severe diabetes, and previous mastectomy.  ANESTHESIA:  General.  INDICATIONS:  The patient is a 61 year old poorly controlled diabetic who underwent heart bypass surgery approximately 8 weeks previously. She had returned to our office with some skin separation of the lower part of the sternal incision which is progressed to a fairly large area of separation and some erythema and drainage.  She was admitted to hospital and placed on antibiotics and scheduled for a wound debridement and wound VAC.  A CT scan performed approximately 3 weeks ago showed no evidence of deep sternal infection.  PROCEDURE:  The patient was brought to the operative room, placed supine on the operating room table.  General anesthesia was induced.  The chest and abdomen were prepped and draped as a sterile field.  The previous sternal incision was made from its lower aspect to the upper third. There has been a subcutaneous tunnel which had developed.  The sternum itself appeared to be stable and healed.  The sternal wires were minimally visible.  There appeared to be a fair amount of fat necrosis. This was sharply debrided.  There was some turbid fluid and this was cultured and irrigated.  The pulse lavage device was  then used to irrigate and cleanse the wound.  A wound VAC sponge was then cut to the appropriate size and placed in the wound, covered with the sterile sheet and connected to continuous suction.  The patient was reversed from anesthesia and returned to recovery room in stable condition with minimal blood loss.     Kerin Perna, M.D.     PV/MEDQ  D:  03/01/2011  T:  03/02/2011  Job:  [id number]

## 2011-03-02 NOTE — Progress Notes (Signed)
Pt's blood sugar was 67. Gave her some ice cream and held insulin. Administered metformin as scheduled. CBG recheck =124. Will monitor for now and recheck at [facility] and intervene appropriately. Did not cover due to very low blood sugar through her stay in the hospital.

## 2011-03-02 NOTE — Progress Notes (Signed)
[address]            [place] [address]          [phone]     1 Day Post-Op  Procedure(s) (LRB): STERNAL WOUND DEBRIDEMENT (N/A) APPLICATION OF WOUND VAC (N/A) Subjective: Mild pain  Objective  Telemetry NSR  Temp:  [97.1 F (36.2 C)-98.1 F (36.7 C)] 97.8 F (36.6 C) (01/04 0520) Pulse Rate:  [88-91] 91  (01/04 0520) Resp:  [18-20] 18  (01/04 0520) BP: (101-117)/(48-65) 108/52 mmHg (01/04 0520) SpO2:  [92 %-96 %] 94 % (01/04 0520) FiO2 (%):  [2 %-4 %] 2 % (01/03 1930) Weight:  [164 lb 14.5 oz (74.8 kg)] 164 lb 14.5 oz (74.8 kg) (01/04 0438)   Intake/Output Summary (Last 24 hours) at 03/02/11 0824 Last data filed at 03/02/11 0700  Gross per 24 hour  Intake   1650 ml  Output   1425 ml  Net    225 ml       General appearance: alert, cooperative and no distress Heart: regular rate and rhythm, S1, S2 normal, no murmur, click, rub or gallop Lungs: clear to auscultation bilaterally Abdomen: soft, non-tender; bowel sounds normal; no masses,  no organomegaly Extremities: no edema, redness or tenderness in the calves or thighs Wound: VAC in place  Lab Results:  [facility] 03/02/11 0552 02/28/11 1714  NA 137 137  K 4.3 4.8  CL 103 98  CO2 27 28  GLUCOSE 89 144*  BUN 14 25*  CREATININE 0.83 0.96  CALCIUM 8.8 9.3  MG -- --  PHOS -- --   No results found for this basename: AST:2,ALT:2,ALKPHOS:2,BILITOT:2,PROT:2,ALBUMIN:2 in the last 72 hours No results found for this basename: LIPASE:2,AMYLASE:2 in the last 72 hours  Basename 03/02/11 0552 02/28/11 1714  WBC 5.1 6.1  NEUTROABS -- 3.7  HGB 9.4* 11.0*  HCT 29.0* 33.5*  MCV 87.9 87.5  PLT 151 163   No results found for this basename: CKTOTAL:4,CKMB:4,TROPONINI:4 in the last 72 hours No components found with this basename: POCBNP:3 No results found for this basename: DDIMER in the last 72 hours  Basename 02/28/11 1714  HGBA1C 6.2*   No results found for this basename:  CHOL,HDL,LDLCALC,TRIG,CHOLHDL in the last 72 hours No results found for this basename: TSH,T4TOTAL,FREET3,T3FREE,THYROIDAB in the last 72 hours No results found for this basename: VITAMINB12,FOLATE,FERRITIN,TIBC,IRON,RETICCTPCT in the last 72 hours  Medications: Scheduled    . aspirin  325 mg Oral Daily  . carvedilol  3.125 mg Oral BID WC  . cefUROXime (ZINACEF)  IV  1.5 g Intravenous Q12H  . folic acid  1 mg Oral Daily  . gabapentin  300 mg Oral TID  . insulin aspart  0-20 Units Subcutaneous TID WC  . insulin aspart  0-5 Units Subcutaneous QHS  . insulin aspart  3 Units Subcutaneous TID WC  . insulin glargine  40 Units Subcutaneous BID  . metFORMIN  500 mg Oral BID WC  . mulitivitamin with minerals  1 tablet Oral Daily  . PARoxetine  20 mg Oral Q0700  . simvastatin  20 mg Oral QHS  . vancomycin  1,000 mg Intravenous Q12H  . DISCONTD: aspirin  325 mg Oral Daily  . DISCONTD: cefUROXime  1.5 g Intravenous Q12H     Radiology/Studies:  Dg Chest 2 View  02/28/2011  *RADIOLOGY REPORT*  Clinical Data: Follow-up CABG  CHEST - 2 VIEW  Comparison: 02/12/2011  Findings: Previous median sternotomy and CABG.  Heart is at the upper limits of normal in size.  There are bilateral pleural effusions, more on the left than the right, with mild basilar atelectasis.  This is improved since previous exam.  No worsening or new findings.  IMPRESSION: Smaller effusions and less basilar atelectasis.  Original Report Authenticated By: Thomasenia Sales, M.D.    INR: Will add last result for INR, ABG once components are confirmed Will add last 4 CBG results once components are confirmed  Assessment/Plan: S/P Procedure(s) (LRB): STERNAL WOUND DEBRIDEMENT (N/A) APPLICATION OF WOUND VAC (N/A)  1. Doing well 2. Continue VAC, and current therapy  LOS: 2 days    [name] E 1/4/20138:24 AM

## 2011-03-02 NOTE — Progress Notes (Signed)
   CARE MANAGEMENT NOTE 03/02/2011  Patient:  [name], [name]   Account Number:  [id number]  Date Initiated:  03/01/2011  Documentation initiated by:  [facility]  Subjective/Objective Assessment:   Admitted with sternal wound infection - plan for debridement and VAC placement.     Action/Plan:   PTA, PT INDEPENDENT, LIVES AT HOME WITH SPOUSE.  HAS HX OF HOME HEALTH WITH [facility] IN [place].  AS OF JAN 1, [facility] NO LONGER ACCEPTS PT'S INSURANCE.   Anticipated DC Date:  03/05/2011   Anticipated DC Plan:  HOME W HOME HEALTH SERVICES      DC Planning Services  CM consult      [facility] Choice  HOME HEALTH   Choice offered to / List presented to:  C-1 Patient   DME arranged  VAC      DME agency  [facility]     HH arranged  HH-1 RN      [facility] agency  [facility] Care   Status of service:  In process, will continue to follow Medicare Important Message given?   (If response is "NO", the following Medicare IM given date fields will be blank) Date Medicare IM given:   Date Additional Medicare IM given:    Discharge Disposition:    Per UR Regulation:  Reviewed for med. necessity/level of care/duration of stay  Comments:  03/01/10 Daimon Kean, RN,BSN  1400 PT WILL NEED HOME HEALTH RN FOR IV VANCOMYCIN AT HOME VIA PICC LINE AND WOUND VAC DRESSING CHANGES.  PT WILL NEED NEW HH AGENCY, AS THE ONE SHE HAD NO LONGER ACCEPTS HER INSURANCE.  AFTER MULTIPLE CALLS TO MULTIPLE AGENCIES, [facility] IN [place] HAS ACCEPTED PATIENT FOR HOME HEALTH NURSING.  [facility]VANCED HOME CARE TO PROVIDE IV VANCOMYCIN.  REFERRAL FAXED TO [facility] AT [facility], FAX [phone].  PHONE # [phone].  [facility] WOUND VAC INSURANCE AUTH FORMS COMPLETED AND SIGNED BY MD.  WILL FAX TO [facility] AND AWAIT INSURANCE AUTHORIZATION FOR HOME VAC. HOPEFUL FOR DC ON MONDAY 03/04/10. Phone #[phone]   03-01-11 7:20am Avie Arenas, RNBSN - [phone] UR Completed.

## 2011-03-03 LAB — GLUCOSE, CAPILLARY
Glucose-Capillary: 106 mg/dL — ABNORMAL HIGH (ref 70–99)
Glucose-Capillary: 164 mg/dL — ABNORMAL HIGH (ref 70–99)
Glucose-Capillary: 106 mg/dL — ABNORMAL HIGH (ref 70–99)
Glucose-Capillary: 115 mg/dL — ABNORMAL HIGH (ref 70–99)

## 2011-03-03 LAB — WOUND CULTURE

## 2011-03-03 MED ORDER — SODIUM CHLORIDE 0.9 % IJ SOLN
10.0000 mL | INTRAMUSCULAR | Status: DC | PRN
  Administered 2011-03-08: 10 mL

## 2011-03-03 NOTE — Progress Notes (Signed)
CARDIAC REHAB PHASE I   PRE:  Rate/Rhythm: 91 SR  BP:  Supine:   Sitting: 102/59  Standing:    SaO2: 95% RA  MODE:  Ambulation: 300 ft   POST:  Rate/Rhythem: 94 SR  BP:  Supine:   Sitting: 109/77  Standing:    SaO2: 94% RA  1350 - 1420 Pt ambulated with minimal  assist steady. No c/o of cp or sob. Request to sit on side of bed to visit with family. Call bell in reach.   Rosalie Doctor

## 2011-03-03 NOTE — Progress Notes (Addendum)
                    [address]            [name] [address]          [phone]     2 Days Post-Op Procedure(s) (LRB): STERNAL WOUND DEBRIDEMENT (N/A) APPLICATION OF WOUND VAC (N/A)  Subjective: Stable night, no complaints.  Objective: Vital signs in last 24 hours: Patient Vitals for the past 24 hrs:  BP Temp Temp src Pulse Resp SpO2 Weight  03/03/11 0500 121/59 mmHg 98.3 F (36.8 C) Oral 98  19  91 % -  03/03/11 0300 - - - - - - 76.023 kg (167 lb 9.6 oz)  03/02/11 1615 - - - 102  - - -  03/02/11 1516 119/65 mmHg 98.1 F (36.7 C) Oral 90  18  90 % -  03/02/11 0858 110/58 mmHg - - 88  - - -   Current Weight  03/03/11 76.023 kg (167 lb 9.6 oz)     Intake/Output from previous day: 01/04 0701 - 01/05 0700 In: 960 [P.O.:960] Out: 250 [Urine:250]  CBGs 67-124-175-106  PHYSICAL EXAM:  Heart: RRR Lungs: clear Wound: sternum with wound VAC in place.  No surrounding erythema Extremities: no edema  Lab Results: CBC: Basename 03/02/11 0552 02/28/11 1714  WBC 5.1 6.1  HGB 9.4* 11.0*  HCT 29.0* 33.5*  PLT 151 163   BMET:  Basename 03/02/11 0552 02/28/11 1714  NA 137 137  K 4.3 4.8  CL 103 98  CO2 27 28  GLUCOSE 89 144*  BUN 14 25*  CREATININE 0.83 0.96  CALCIUM 8.8 9.3    PT/INR:  Basename 02/28/11 1714  LABPROT 14.5  INR 1.11     Assessment/Plan: S/P Procedure(s) (LRB): STERNAL WOUND DEBRIDEMENT (N/A) APPLICATION OF WOUND VAC (N/A) Continue wound VAC, IV Vanc DM- sugars under control.    LOS: 3 days    COLLINS,GINA H 03/03/2011    Chart reviewed, patient examined, agree with above.

## 2011-03-04 LAB — GLUCOSE, CAPILLARY
Glucose-Capillary: 107 mg/dL — ABNORMAL HIGH (ref 70–99)
Glucose-Capillary: 145 mg/dL — ABNORMAL HIGH (ref 70–99)
Glucose-Capillary: 82 mg/dL (ref 70–99)
Glucose-Capillary: 97 mg/dL (ref 70–99)

## 2011-03-04 LAB — WOUND CULTURE

## 2011-03-04 NOTE — Progress Notes (Signed)
I agree with the dictation

## 2011-03-04 NOTE — Progress Notes (Addendum)
                    [address]            [place] [address]          [phone]     3 Days Post-Op Procedure(s) (LRB): STERNAL WOUND DEBRIDEMENT (N/A) APPLICATION OF WOUND VAC (N/A)  Subjective: Stable, no complaints.  Objective: Vital signs in last 24 hours: Patient Vitals for the past 24 hrs:  BP Temp Temp src Pulse Resp SpO2 Weight  03/04/11 0639 118/58 mmHg - - 94  - - -  03/04/11 0419 135/78 mmHg 98.2 F (36.8 C) Oral 98  19  93 % 76.749 kg (169 lb 3.2 oz)  03/03/11 2005 123/69 mmHg 98.2 F (36.8 C) Oral 92  19  92 % -  03/03/11 1456 102/58 mmHg 98.4 F (36.9 C) Oral 92  20  92 % -   Current Weight  03/04/11 76.749 kg (169 lb 3.2 oz)     Intake/Output from previous day: 01/05 0701 - 01/06 0700 In: 1520 [P.O.:720; I.V.:500; IV Piggyback:300] Out: 1100 [Urine:1000; Drains:100]  CBGs 106-115-107  PHYSICAL EXAM:  Heart: RRR Lungs: clear Wound: sternal VAC in place, no surrounding erythema Extremities: no edema  Lab Results: CBC: Basename 03/02/11 0552  WBC 5.1  HGB 9.4*  HCT 29.0*  PLT 151   BMET:  Basename 03/02/11 0552  NA 137  K 4.3  CL 103  CO2 27  GLUCOSE 89  BUN 14  CREATININE 0.83  CALCIUM 8.8    PT/INR: No results found for this basename: LABPROT,INR in the last 72 hours   Assessment/Plan: S/P Procedure(s) (LRB): STERNAL WOUND DEBRIDEMENT (N/A) APPLICATION OF WOUND VAC (N/A)  Continue IV Vanc, wound VAC.  Will start VAC changes in am DM- CBGs controlled. CV- stable   LOS: 4 days    COLLINS,GINA H 03/04/2011    Chart reviewed, patient examined, agree with above.

## 2011-03-05 ENCOUNTER — Ambulatory Visit: Payer: Managed Care, Other (non HMO) | Admitting: Adult Health

## 2011-03-05 LAB — GLUCOSE, CAPILLARY
Glucose-Capillary: 117 mg/dL — ABNORMAL HIGH (ref 70–99)
Glucose-Capillary: 90 mg/dL (ref 70–99)
Glucose-Capillary: 134 mg/dL — ABNORMAL HIGH (ref 70–99)
Glucose-Capillary: 109 mg/dL — ABNORMAL HIGH (ref 70–99)

## 2011-03-05 MED ORDER — FOLIC ACID 1 MG PO TABS: 1.0000 mg | ORAL_TABLET | Freq: Every day | ORAL | Status: DC

## 2011-03-05 MED ORDER — DEXTROSE 5 % IV SOLN: 2.0000 g | INTRAVENOUS | Status: DC

## 2011-03-05 MED ORDER — HYDROCODONE-ACETAMINOPHEN 5-325 MG PO TABS: 1.0000 | ORAL_TABLET | Freq: Four times a day (QID) | ORAL | Status: DC | PRN

## 2011-03-05 NOTE — Progress Notes (Addendum)
                   [name]            [place] [address]          [phone]     4 Days Post-Op  Procedure(s) (LRB): STERNAL WOUND DEBRIDEMENT (N/A) APPLICATION OF WOUND VAC (N/A) Subjective: Feels well  Objective  Telemetry NSR  Temp:  [97.8 F (36.6 C)-99 F (37.2 C)] 97.8 F (36.6 C) (01/07 0409) Pulse Rate:  [78-89] 82  (01/07 0619) Resp:  [17-19] 19  (01/07 0409) BP: (107-135)/(54-71) 107/54 mmHg (01/07 0619) SpO2:  [93 %-97 %] 97 % (01/07 0837) Weight:  [166 lb 3.2 oz (75.388 kg)] 166 lb 3.2 oz (75.388 kg) (01/07 0409)   Intake/Output Summary (Last 24 hours) at 03/05/11 1059 Last data filed at 03/05/11 0837  Gross per 24 hour  Intake    340 ml  Output   1545 ml  Net  -1205 ml       General appearance: alert and no distress Heart: regular rate and rhythm Lungs: clear to auscultation bilaterally Abdomen: soft, non-tender; bowel sounds normal; no masses,  no organomegaly Extremities: extremities normal, atraumatic, no cyanosis or edema Wound: VAC in place no erethema  Lab Results: No results found for this basename: NA:2,K:2,CL:2,CO2:2,GLUCOSE:2,BUN:2,CREATININE:2,CALCIUM:2,MG:2,PHOS:2 in the last 72 hours No results found for this basename: AST:2,ALT:2,ALKPHOS:2,BILITOT:2,PROT:2,ALBUMIN:2 in the last 72 hours No results found for this basename: LIPASE:2,AMYLASE:2 in the last 72 hours No results found for this basename: WBC:2,NEUTROABS:2,HGB:2,HCT:2,MCV:2,PLT:2 in the last 72 hours No results found for this basename: CKTOTAL:4,CKMB:4,TROPONINI:4 in the last 72 hours No components found with this basename: POCBNP:3 No results found for this basename: DDIMER in the last 72 hours No results found for this basename: HGBA1C in the last 72 hours No results found for this basename: CHOL,HDL,LDLCALC,TRIG,CHOLHDL in the last 72 hours No results found for this basename: TSH,T4TOTAL,FREET3,T3FREE,THYROIDAB in the last 72 hours No results found for  this basename: VITAMINB12,FOLATE,FERRITIN,TIBC,IRON,RETICCTPCT in the last 72 hours  Medications: Scheduled    . aspirin  325 mg Oral Daily  . carvedilol  3.125 mg Oral BID WC  . cefTAZidime (FORTAZ)  IV  1 g Intravenous Q8H  . enoxaparin  40 mg Subcutaneous Q24H  . folic acid  1 mg Oral Daily  . gabapentin  300 mg Oral TID  . insulin aspart  0-20 Units Subcutaneous TID WC  . insulin aspart  0-5 Units Subcutaneous QHS  . insulin aspart  3 Units Subcutaneous TID WC  . insulin glargine  40 Units Subcutaneous BID  . metFORMIN  500 mg Oral BID WC  . mulitivitamin with minerals  1 tablet Oral Daily  . PARoxetine  20 mg Oral Q0700  . simvastatin  20 mg Oral QHS     Radiology/Studies:  No results found.  INR: Will add last result for INR, ABG once components are confirmed Will add last 4 CBG results once components are confirmed  Assessment/Plan: S/P Procedure(s) (LRB): STERNAL WOUND DEBRIDEMENT (N/A) APPLICATION OF WOUND VAC (N/A)  1. Doing well, Dr Maren Beach to eval wound today, cont iv vancomycin for now   2. patient examined and medical record reviewed,agree with above note.Will start Rocephin once daily to cover Serratia cultured from wound. Keep in hospital until next [facility] change Wed. [name],Deriona Altemose 03/05/2011      LOS: 5 days    [name],[name] 1/7/201310:59 AM

## 2011-03-05 NOTE — Progress Notes (Signed)
   CARE MANAGEMENT NOTE 03/05/2011  Patient:  [name], [name]   Account Number:  [id number]  Date Initiated:  03/01/2011  Documentation initiated by:  [facility]  Subjective/Objective Assessment:   Admitted with sternal wound infection - plan for debridement and VAC placement.     Action/Plan:   PTA, PT INDEPENDENT, LIVES AT HOME WITH SPOUSE.  HAS HX OF HOME HEALTH WITH [facility] IN [place].  AS OF JAN 1, [facility] NO LONGER ACCEPTS PT'S INSURANCE.   Anticipated DC Date:  03/07/2011   Anticipated DC Plan:  HOME W HOME HEALTH SERVICES      DC Planning Services  CM consult      [facility] Choice  HOME HEALTH   Choice offered to / List presented to:  C-1 Patient   DME arranged  VAC      DME agency  [facility]     HH arranged  HH-1 RN      [facility] agency  [facility] Care   Status of service:  In process, will continue to follow Medicare Important Message given?   (If response is "NO", the following Medicare IM given date fields will be blank) Date Medicare IM given:   Date Additional Medicare IM given:    Discharge Disposition:    Per UR Regulation:  Reviewed for med. necessity/level of care/duration of stay  Comments:  03/04/10 Maryellen Dowdle,RN,BSN 1145 SPOKE WITH CASSANDRA AT [facility]:  WOUND VAC HAS BEEN APPROVED, AND WILL BE RELEASED SHORTLY. WOUND VAC TO BE DELIVERED WITHIN 3-4 HRS.  POSSIBLE DC LATER TODAY.  PT  WILL NEED 2ND DOSE OF VANCOMYCIN PRIOR TO DC HOME. Phone #[phone]   03-05-11 8:50am Avie Arenas, RNBSN - [phone] UR completed.  03/01/10 Ceana Fiala, RN,BSN  1400 PT WILL NEED HOME HEALTH RN FOR IV VANCOMYCIN AT HOME VIA PICC LINE AND WOUND VAC DRESSING CHANGES.  PT WILL NEED NEW HH AGENCY, AS THE ONE SHE HAD NO LONGER ACCEPTS HER INSURANCE.  AFTER MULTIPLE CALLS TO MULTIPLE AGENCIES, [facility] IN [place] HAS ACCEPTED PATIENT FOR HOME HEALTH NURSING.  [facility] TO PROVIDE [facility]AN[facility]YCIN.  REFERRAL FAXED TO [facility] AT [facility],  FAX [phone].  PHONE # [phone].  [facility] WOUND VAC INSURANCE AUTH FORMS COMPLETED AND SIGNED BY MD.  WILL FAX TO [facility] AND AWAIT INSURANCE AUTHORIZATION FOR HOME VAC. HOPEFUL FOR DC ON MONDAY 03/04/10. Phone #[phone]   03-01-11 7:20am Avie Arenas, RNBSN - [phone] UR Completed.

## 2011-03-05 NOTE — Discharge Summary (Addendum)
[address]            [place] [address]          [phone]      [name] [birth year] 61 y.o. [id number]  02/28/2011   Kathlee Nations Trigt III, MD  Sternal wound sternal wound abscess  HPI: Patient is a 61 year old diabetic status post mastectomy for cancer with urgent multivessel bypass grafting proximally 8 weeks ago. She presents to the office for a routine followup. Clinically she is doing well except for a open sternal wound which appears to be superficial wound infection. She denies fever. She denies angina or symptoms of CHF. She has not been taking any antibiotics. Home health nursing is not been performing wound care  Past Medical History   Diagnosis  Date   .  Coronary atherosclerosis of native coronary artery      BMS circ 2001, DES LAD 2005, DES LAD and RCA 2008, subsequent CABG   .  Drug allergy      Allergy to Plavix (hives) - took Ticlid in past   .  Essential hypertension, benign    .  Mixed hyperlipidemia    .  Type 2 diabetes mellitus    .  Breast cancer      Chemo and mastectomy   .  Myocardial infarction  ~ 2002   .  Arthritis      rheumatoid   .  Depression    .  Peripheral neuropathy    .  Peptic ulcer disease  ~ 1975   .  Nephrolithiasis  02/07/2011    Past Surgical History   Procedure  Date   .  Mastectomy  1988     right   .  Cholecystectomy  1969   .  Abdominal hysterectomy  1989   .  Tonsillectomy and adenoidectomy  1979   .  Appendectomy  1969   .  Tubal ligation  1972   .  Coronary artery bypass graft  01/11/2011     Procedure: CORONARY ARTERY BYPASS GRAFTING (CABG); Surgeon: Kathlee Nations Suann Larry, MD; Location: [facility] OR; Service: Open Heart Surgery; Laterality: N/A; cabg x four, using left internal mammary artery and right leg greater saphenous vein harvestede endoscopically    No family history on file.  Social History: reports that she quit smoking about 4 years ago. Her smoking use included  Cigarettes. She has a 20 pack-year smoking history. She has never used smokeless tobacco. She reports that she does not drink alcohol or use illicit drugs.  Allergies:  Allergies   Allergen  Reactions   .  Clopidogrel Bisulfate  Hives   .  Codeine  Nausea And Vomiting   .  Morphine  Nausea And Vomiting    Medications Prior to Admission   Medication  Sig  Dispense  Refill   .  aspirin 325 MG tablet  Take 325 mg by mouth daily.     .  carvedilol (COREG) 3.125 MG tablet  Take 1 tablet (3.125 mg total) by mouth 2 (two) times daily.  60 tablet  3   .  folic acid (FOLVITE) 1 MG tablet  Take 1 tablet (1 mg total) by mouth daily. For one month then may stop.     Marland Kitchen  gabapentin (NEURONTIN) 300 MG capsule  Take 300 mg by mouth 3 (three) times daily.     Marland Kitchen  insulin glargine (LANTUS) 100 UNIT/ML injection  Inject 40 Units into the skin 2 (two) times daily. Patient to gradually increase her insulin glargine to pre op dose of 100 units [place] two times daily. Contact medical doctor for any questions.     .  insulin lispro (HUMALOG) 100 UNIT/ML injection  Inject 20 Units into the skin 3 (three) times daily before meals. Patient to gradually increase insulin lispro to the pre op dose of 84 units [place] three times daily. Contact medical doctor for any questions.     .  metFORMIN (GLUCOPHAGE) 500 MG tablet  Take 500 mg by mouth 2 (two) times daily with a meal.     .  Multiple Vitamin (MULTIVITAMIN) tablet  Take 1 tablet by mouth daily.     Marland Kitchen  PARoxetine (PAXIL) 20 MG tablet  Take 20 mg by mouth every morning.     .  simvastatin (ZOCOR) 20 MG tablet  Take 20 mg by mouth at bedtime.      No current facility-administered medications on file as of 02/28/2011.    No results found for this or any previous visit (from the past 48 hour(s)).  Dg Chest 2 View  02/28/2011 *RADIOLOGY REPORT* Clinical Data: Follow-up CABG CHEST - 2 VIEW Comparison: 02/12/2011 Findings: Previous median sternotomy and CABG. Heart is at the upper limits  of normal in size. There are bilateral pleural effusions, more on the left than the right, with mild basilar atelectasis. This is improved since previous exam. No worsening or new findings. IMPRESSION: Smaller effusions and less basilar atelectasis. Original Report Authenticated By: Thomasenia Sales, M.D.   ROS she's been afebrile. Her weight has been stable at approximately 160 pounds at home. Her peripheral edema has resolved. Her EF following surgery is approximately 35-40%.  Blood pressure 102/61, pulse 98, resp. rate 18, height 5\' 5"  (1.651 m), weight 161 lb (73.029 kg), last menstrual period 01/05/1996, SpO2 97.00%.  Physical Exam  General appearance alert and oriented without distress  HEENT normocephalic  Chest with a open lower sternal in decision with a subcutaneous defect up to the mid to upper sternal incision which needs to be treated and opened  Cardiac rhythm regular without gallop  Bowel soft  Extremities without edema or tenderness vein harvest site healed  Neurologic exam intact  Assessment/Plan at the time of admission : 61 year old diabetic diabetic with superficial sternal infection needs to be admitted for IV antibiotics debridement and a wound VAC placement in the OR tomorrow.  [facility] Course:  The patient was admitted to the hospital and taken to the operating room on 02/28/2011 - 03/01/2011 and underwent Procedure(s): STERNAL WOUND DEBRIDEMENT APPLICATION OF WOUND VAC.     Postoperative hospital course: Overall the patient has progressed nicely. She has been treated with intravenous vancomycin as well as Museum/gallery conservator. Her wound appears to be doing well. The VAC is in place. She is tolerating routine activities using standard protocols. Her pain is under control. At this time we need to determine the duration of intravenous antibiotics/transition to oral.  No results found for this basename: NA:2,K:2,CL:2,CO2:2,GLUCOSE:2,BUN:2,CRATININE:2,CALCIUM:2 in the last 72  hours No results found for this basename: WBC:2,HGB:2,HCT:2,PLT:2 in the last 72 hours No results found for this basename: INR:2 in the last 72 hours   Discharge Instructions:  The patient is discharged to home with extensive instructions on wound care and progressive ambulation.  They are instructed not to drive or perform any heavy lifting until returning  to see the physician in his office.  Discharge Diagnosis:  Sternal wound sternal wound abscess  Secondary Diagnosis: Patient Active Problem List  Diagnoses  . Type II or unspecified type diabetes mellitus without mention of complication, uncontrolled  . HYPERLIPIDEMIA-MIXED  . DEPRESSION  . Essential hypertension, benign  . DIVERTICULOSIS  . FATTY LIVER DISEASE  . ARTHRITIS  . Unstable angina  . Coronary atherosclerosis of native coronary artery  . Abnormal TSH  . Thrombocytopenia  . Carotid stenosis  . Atrial fibrillation  . Chronic systolic heart failure  . DM type 2 with diabetic peripheral neuropathy  . Bradycardia  . Hyperkalemia  . Hyperphosphatemia  . ARF (acute renal failure)  . Bifascicular block  . Anemia  . Fatty liver  . Cirrhosis  . Splenomegaly  . Kidney stone on left side   Past Medical History  Diagnosis Date  . Coronary atherosclerosis of native coronary artery     BMS circ 2001, DES LAD 2005, DES LAD and RCA 2008, subsequent CABG  . Drug allergy     Allergy to Plavix (hives) - took Ticlid in past  . Essential hypertension, benign   . Mixed hyperlipidemia   . Type 2 diabetes mellitus   . Breast cancer     Chemo and mastectomy  . Myocardial infarction ~ 2002  . Arthritis     rheumatoid  . Depression   . Peripheral neuropathy   . Peptic ulcer disease ~ 1975  . Nephrolithiasis 02/07/2011  . Angina        Ellan, Tess  Home Medication Instructions [id number]   Printed on:03/05/11 1435  Medication Information                    aspirin 325 MG tablet Take 325 mg by mouth  daily.             metFORMIN (GLUCOPHAGE) 500 MG tablet Take 500 mg by mouth 2 (two) times daily with a meal.             gabapentin (NEURONTIN) 300 MG capsule Take 300 mg by mouth 3 (three) times daily.             simvastatin (ZOCOR) 20 MG tablet Take 20 mg by mouth at bedtime.             PARoxetine (PAXIL) 20 MG tablet Take 20 mg by mouth every morning.             Multiple Vitamin (MULTIVITAMIN) tablet Take 1 tablet by mouth daily.           carvedilol (COREG) 3.125 MG tablet Take 1 tablet (3.125 mg total) by mouth 2 (two) times daily.           insulin glargine (LANTUS) 100 UNIT/ML injection Inject 40 Units into the skin 2 (two) times daily. Patient to gradually increase her insulin glargine to pre op dose of 100 units [place] two times daily.  Contact medical doctor for any questions.           insulin lispro (HUMALOG) 100 UNIT/ML injection Inject 20 Units into the skin 3 (three) times daily before meals. Patient to gradually increase insulin lispro to the pre op dose of 84 units [place] three times daily.  Contact medical doctor for any questions.            folic acid (FOLVITE) 1 MG tablet Take 1 tablet (1 mg total) by mouth daily.  HYDROcodone-acetaminophen (NORCO) 5-325 MG per tablet Take 1-2 tablets by mouth every 6 (six) hours as needed.            Addendum:  In addition to the above medications the patient will be discharged on Rocephin 2 g IV every 24 hours for 7 additional days via her PICC line. Disposition: To be discharged home  Patient's condition is Good  Gershon Crane, PA-C 03/05/2011  2:35 PM

## 2011-03-05 NOTE — Discharge Summary (Signed)
Agree with dictation

## 2011-03-05 NOTE — Progress Notes (Signed)
CARDIAC REHAB PHASE I   PRE:  Rate/Rhythm: 82 SR  BP:  Supine: 100/44  Sitting:   Standing:    SaO2: 96 RA  MODE:  Ambulation: 690 ft   POST:  Rate/Rhythem: 99 SR  BP:  Supine:   Sitting: 110/50  Standing:    SaO2: 95 RA 1028-1104 Assisted X1 to ambulate. Gait steady. Walked 690 ft without c/o. To recliner after walk with call light in reach.  [name]

## 2011-03-06 LAB — GLUCOSE, CAPILLARY
Glucose-Capillary: 132 mg/dL — ABNORMAL HIGH (ref 70–99)
Glucose-Capillary: 115 mg/dL — ABNORMAL HIGH (ref 70–99)
Glucose-Capillary: 120 mg/dL — ABNORMAL HIGH (ref 70–99)
Glucose-Capillary: 151 mg/dL — ABNORMAL HIGH (ref 70–99)

## 2011-03-06 MED ORDER — HYDROMORPHONE HCL PF 1 MG/ML IJ SOLN
1.0000 mg | INTRAMUSCULAR | Status: DC
  Administered 2011-03-06 – 2011-03-07 (×4): 1 mg via INTRAVENOUS
  Filled 2011-03-06 (×3): qty 1

## 2011-03-06 NOTE — Progress Notes (Signed)
   CARE MANAGEMENT NOTE 03/06/2011  Patient:  [name], [name]   Account Number:  [id number]  Date Initiated:  03/01/2011  Documentation initiated by:  [facility]  Subjective/Objective Assessment:   Admitted with sternal wound infection - plan for debridement and VAC placement.     Action/Plan:   PTA, PT INDEPENDENT, LIVES AT HOME WITH SPOUSE.  HAS HX OF HOME HEALTH WITH [facility] IN [place].  AS OF JAN 1, [facility] NO LONGER ACCEPTS PT'S INSURANCE.   Anticipated DC Date:  03/07/2011   Anticipated DC Plan:  HOME W HOME HEALTH SERVICES      DC Planning Services  CM consult      [facility] Choice  HOME HEALTH   Choice offered to / List presented to:  C-1 Patient   DME arranged  VAC      DME agency  [facility]     HH arranged  HH-1 RN      [facility] agency  [facility] Care   Status of service:  In process, will continue to follow Medicare Important Message given?   (If response is "NO", the following Medicare IM given date fields will be blank) Date Medicare IM given:   Date Additional Medicare IM given:    Discharge Disposition:  HOME W HOME HEALTH SERVICES  Per UR Regulation:  Reviewed for med. necessity/level of care/duration of stay  Comments:  03/05/10 Edwardo Wojnarowski,RN,BSN  1030 [facility] WOUND VAC DELIVERED TO PT ROOM LAST EVENING.  DISCHARGE NOW SCHEDULED FOR WED 1/9.Marland KitchenMarland KitchenNOTIFIED [facility] AND [facility].  FAXED FINAL HH ORDERS TO [facility] AT [place] [facility] [phone]. Phone #[phone]   03/04/10 Nemesis Rainwater,RN,BSN 1145 SPOKE WITH CASSANDRA AT [facility]:  WOUND VAC HAS BEEN APPROVED, AND WILL BE RELEASED SHORTLY. WOUND VAC TO BE DELIVERED WITHIN 3-4 HRS.  POSSIBLE DC LATER TODAY.  PT  WILL NEED 2ND DOSE OF VANCOMYCIN PRIOR TO DC HOME.  03-05-11 8:50am Avie Arenas, RNBSN [phone] UR completed.  03/01/10 Uma Jerde, RN,BSN  1400 PT WILL NEED HOME HEALTH RN FOR IV VANCOMYCIN AT HOME VIA PICC LINE AND WOUND VAC DRESSING CHANGES.  PT WILL NEED NEW HH AGENCY, AS THE ONE  SHE HAD NO LONGER ACCEPTS HER INSURANCE.  AFTER MULTIPLE CALLS TO MULTIPLE AGENCIES, [facility] IN [place] HAS ACCEPTED PATIENT FOR HOME HEALTH NURSING.  [facility] TO [facility]AN[facility]CIN.  REFERRAL FAXED TO [facility] AT [facility], FAX [phone].  PHONE # [phone].  [facility] WOUND VAC INSURANCE AUTH FORMS COMPLETED AND SIGNED BY MD.  WILL FAX TO [facility] AND AWAIT INSURANCE AUTHORIZATION FOR HOME VAC. HOPEFUL FOR DC ON MONDAY 03/04/10.  03-01-11 7:20am Avie Arenas, RNBSN [phone] UR Completed.

## 2011-03-06 NOTE — Progress Notes (Signed)
Patient put out 40ml from wound vac from 7a-7p today.  Will continue to monitor.

## 2011-03-06 NOTE — Progress Notes (Signed)
CARDIAC REHAB PHASE I   PRE:  Rate/Rhythm: 92 SR  BP:  Supine:   Sitting: 110/60  Standing:    SaO2: 96 RA  MODE:  Ambulation: 840 ft   POST:  Rate/Rhythem: 96  BP:  Supine:   Sitting: 110/50  Standing:    SaO2: 97 RA 0942-1020 Assisted X1 to ambulate with hand held assist. Gait steady VS stable.To recliner after walk with call light in reach.  Beatrix Fetters

## 2011-03-06 NOTE — Progress Notes (Signed)
                    [address]            [place] [address]          [phone]     5 Days Post-Op Procedure(s) (LRB): STERNAL WOUND DEBRIDEMENT (N/A) APPLICATION OF WOUND VAC (N/A)  Subjective: Feeling well, no complaints.  Objective: Vital signs in last 24 hours: Patient Vitals for the past 24 hrs:  BP Temp Temp src Pulse Resp SpO2  03/05/11 2058 120/69 mmHg 98.2 F (36.8 C) Oral 97  18  94 %  03/05/11 1600 102/60 mmHg - - - - -  03/05/11 1300 98/60 mmHg 98.4 F (36.9 C) Oral 88  18  96 %  03/05/11 0837 - - - - - 97 %   Current Weight  03/05/11 75.388 kg (166 lb 3.2 oz)     Intake/Output from previous day: 01/07 0701 - 01/08 0700 In: 1230 [P.O.:1080; IV Piggyback:150] Out: 1200 [Urine:600; Drains:600]  CBGs [phone]  PHYSICAL EXAM:  Heart: RRR Lungs: clear Wound: VAC in place mid sternum with no surrounding erythema    Assessment/Plan: S/P Procedure(s) (LRB): STERNAL WOUND DEBRIDEMENT (N/A) APPLICATION OF WOUND VAC (N/A) Continue VAC, abx (Vanc, Fortaz, Rocephin).   Hopefully home in am after VAC change.  Will need to determine duration of home abx therapy.   LOS: 6 days    Joscelyn Hardrick H 03/06/2011

## 2011-03-07 LAB — GLUCOSE, CAPILLARY
Glucose-Capillary: 102 mg/dL — ABNORMAL HIGH (ref 70–99)
Glucose-Capillary: 114 mg/dL — ABNORMAL HIGH (ref 70–99)
Glucose-Capillary: 87 mg/dL (ref 70–99)
Glucose-Capillary: 109 mg/dL — ABNORMAL HIGH (ref 70–99)
Glucose-Capillary: 93 mg/dL (ref 70–99)

## 2011-03-07 MED ORDER — HYDROMORPHONE HCL PF 1 MG/ML IJ SOLN: 1.0000 mg | INTRAMUSCULAR | Status: DC

## 2011-03-07 MED ORDER — ONDANSETRON HCL 4 MG/2ML IJ SOLN
INTRAMUSCULAR | Status: AC
  Administered 2011-03-07: 4 mg
  Filled 2011-03-07: qty 2

## 2011-03-07 MED ORDER — ONDANSETRON HCL 4 MG/2ML IJ SOLN: 4.0000 mg | Freq: Four times a day (QID) | INTRAMUSCULAR | Status: DC | PRN

## 2011-03-07 MED ORDER — TRAMADOL HCL 50 MG PO TABS: 50.0000 mg | ORAL_TABLET | Freq: Four times a day (QID) | ORAL | Status: AC | PRN

## 2011-03-07 MED ORDER — TRAMADOL HCL 50 MG PO TABS
50.0000 mg | ORAL_TABLET | Freq: Four times a day (QID) | ORAL | Status: DC | PRN
  Administered 2011-03-07 – 2011-03-08 (×3): 50 mg via ORAL
  Filled 2011-03-07 (×3): qty 1

## 2011-03-07 NOTE — Progress Notes (Signed)
Wound vac changed per MD order and hospital policy.  Patient tolerated well. Will continue to monitor.

## 2011-03-07 NOTE — Progress Notes (Signed)
6 Days Post-Op Procedure(s) (LRB): STERNAL WOUND DEBRIDEMENT (N/A) APPLICATION OF WOUND VAC (N/A)  Subjective: Patient with emesis last evening after taking 2 Vicodin. Had emesis again this am.  Objective: Vital signs in last 24 hours: Patient Vitals for the past 24 hrs:  BP Temp Temp src Pulse Resp SpO2  03/07/11 0617 114/68 mmHg 97.5 F (36.4 C) Oral 81  18  95 %  03/06/11 2038 113/68 mmHg 97.8 F (36.6 C) Oral 93  18  92 %  03/06/11 1404 117/74 mmHg 97.9 F (36.6 C) Oral 90  18  93 %    Current Weight  03/05/11 166 lb 3.2 oz (75.388 kg)      Intake/Output from previous day: 01/08 0701 - 01/09 0700 In: 1470 [P.O.:1320; IV Piggyback:150] Out: 1540 [Urine:1200; Emesis/NG output:300]   Physical Exam:  Cardiovascular: RRR, no murmurs, gallops, or rubs. Pulmonary: Clear to auscultation bilaterally; no rales, wheezes, or rhonchi. Abdomen: Soft, non tender, bowel sounds present. Extremities: Mild bilateral lower extremity edema. Wounds: Wound vac in place.  No erythema or signs of infection.  Lab Results: CBC:No results found for this basename: WBC:2,HGB:2,HCT:2,PLT:2 in the last 72 hours BMET: No results found for this basename: NA:2,K:2,CL:2,CO2:2,GLUCOSE:2,BUN:2,CREATININE:2,CALCIUM:2 in the last 72 hours  PT/INR: No results found for this basename: LABPROT,INR in the last 72 hours ABG:  INR: Will add last result for INR, ABG once components are confirmed Will add last 4 CBG results once components are confirmed  Assessment/Plan: 1.Continue wound vac and antibiotic Elita Quick). 2. Stop Vicodin and will try Ultram. 3.Zofran PRN.   Eleanor Dimichele MPA-C 03/07/2011

## 2011-03-07 NOTE — Discharge Summary (Signed)
Addendum Note to D/C [name] is a 61 y.o. female who is S/P Procedure(s): STERNAL WOUND DEBRIDEMENT APPLICATION OF WOUND VAC.  The patient developed nausea and emesis the evening of 03/06/2011 after taking 2 Norco. She denied abdominal pain. She had another episode of emesis earlier this am.  As a result, I have discontinued her Norco. She is agreeable to try Ultram (has Codeine allergy).  In addition, she will be monitored over the next 24 hours. Provided her nausea and emesis have resolved, she will be surgically stable for discharge 1/102013, pending morning round evaluation. The only change to her previously dictated discharge medications is the discontinuation of Norco and the addition of Ultram. PICC line will be removed by home health nurse after last dose of Rocephin.  No changes to History, Physical Exam,  or D/C instructions  Ardelle Balls PA-C 03/07/2011 12:19 PM

## 2011-03-07 NOTE — Progress Notes (Signed)
CARDIAC REHAB PHASE I   PRE:  Rate/Rhythm: 85SR  BP:  Supine:   Sitting: 100/40  Standing:    SaO2: 95%RA  MODE:  Ambulation: 890 ft   POST:  Rate/Rhythem: 84  BP:  Supine: 94/60  Sitting:   Standing:    SaO2: 97%RA 1330-1405 Pt walked 890 ft with handheld asst. Gait steady. Tolerated well. No nausea. To bed after walk.  Duanne Limerick

## 2011-03-07 NOTE — Progress Notes (Signed)
Episode of  Vomiting last pm  approx   300  Undigested  Food  After  Hs  Snack          50cc  Vac  contaner

## 2011-03-08 LAB — GLUCOSE, CAPILLARY
Glucose-Capillary: 49 mg/dL — ABNORMAL LOW (ref 70–99)
Glucose-Capillary: 79 mg/dL (ref 70–99)
Glucose-Capillary: 102 mg/dL — ABNORMAL HIGH (ref 70–99)

## 2011-03-08 MED ORDER — DEXTROSE 5 % IV SOLN
2.0000 g | INTRAVENOUS | Status: DC
  Administered 2011-03-08: 2 g via INTRAVENOUS
  Filled 2011-03-08: qty 2

## 2011-03-08 MED ORDER — HEPARIN SOD (PORK) LOCK FLUSH 100 UNIT/ML IV SOLN
250.0000 [IU] | INTRAVENOUS | Status: AC | PRN
  Administered 2011-03-08: 500 [IU]

## 2011-03-08 NOTE — Progress Notes (Signed)
   CARE MANAGEMENT NOTE 03/08/2011  Patient:  [name], [name]   Account Number:  [id number]  Date Initiated:  03/01/2011  Documentation initiated by:  [facility]  Subjective/Objective Assessment:   Admitted with sternal wound infection - plan for debridement and VAC placement.     Action/Plan:   PTA, PT INDEPENDENT, LIVES AT HOME WITH SPOUSE.  HAS HX OF HOME HEALTH WITH [facility] IN [place].  AS OF JAN 1, [facility] NO LONGER ACCEPTS PT'S INSURANCE.   Anticipated DC Date:  03/08/2011   Anticipated DC Plan:  HOME W HOME HEALTH SERVICES      DC Planning Services  CM consult      [facility] Choice  HOME HEALTH   Choice offered to / List presented to:  C-1 Patient   DME arranged  VAC      DME agency  [facility]     HH arranged  HH-1 RN      [facility] agency  [facility]   Status of service:  Completed, signed off Medicare Important Message given?   (If response is "NO", the following Medicare IM given date fields will be blank) Date Medicare IM given:   Date Additional Medicare IM given:    Discharge Disposition:  HOME W HOME HEALTH SERVICES  Per UR Regulation:  Reviewed for med. necessity/level of care/duration of stay  Comments:  03/08/11  Kamariyah Timberlake, RN, BSN 1300 PT DISCHARGED HOME TODAY WITH HUSBAND AND HOME HEALTH SERVICES AS ARRANGED.  FIRST DOSE OF ROCEPHIN GIVEN PRIOR TO DC HOME.  NOTIFIED SALLY AT [facility] CARE OF HOME CARE AND MARY AT [facility] OF DC TODAY. Phone #[phone]   03/07/11 Mazzy Santarelli,RN,BSN DISCHARGE HELD TODAY DUE TO NAUSEA.  NOTIFIED [facility] AND [facility]. Phone #[phone]   03/06/11 Ireland Virrueta,RN,BSN  1030 [facility] WOUND VAC DELIVERED TO PT ROOM LAST EVENING.  DISCHARGE NOW SCHEDULED FOR WED 1/9.Marland KitchenMarland KitchenNOTIFIED [facility] AND [facility].  FAXED FINAL HH ORDERS TO SALLY AT [place] [facility] [phone]. Phone #[phone]   03/05/11 Havoc Sanluis,RN,BSN 1145 SPOKE WITH CASSANDRA AT [facility]:  WOUND VAC HAS BEEN APPROVED, AND WILL BE  RELEASED SHORTLY. WOUND VAC TO BE DELIVERED WITHIN 3-4 HRS.  POSSIBLE DC LATER TODAY.  PT  WILL NEED 2ND DOSE OF VANCOMYCIN PRIOR TO DC HOME. Phone #[phone]  03-05-11 8:50am Avie Arenas, RNBSN - [phone] UR completed.  03/02/11 Tariah Transue, RN,BSN  1400 PT WILL NEED HOME HEALTH RN FOR IV VANCOMYCIN AT HOME VIA PICC LINE AND WOUND VAC DRESSING CHANGES.  PT WILL NEED NEW HH AGENCY, AS THE ONE SHE HAD NO LONGER ACCEPTS HER INSURANCE.  AFTER MULTIPLE CALLS TO MULTIPLE AGENCIES, [facility] IN [place] HAS ACCEPTED PATIENT FOR HOME HEALTH NURSING.  [facility]VANC[facility] HOME CARE TO PROVIDE IV VANCOMYCIN.  REFERRAL FAXED TO [facility] AT [facility], FAX [phone].  PHONE # [phone].  [facility] WOUND VAC INSURANCE AUTH FORMS COMPLETED AND SIGNED BY MD.  WILL FAX TO [facility] AND AWAIT INSURANCE AUTHORIZATION FOR HOME VAC. HOPEFUL FOR DC ON MONDAY 03/04/10. Phone #[phone]   03-01-11 7:20am Avie Arenas, RNBSN - [phone] UR Completed.

## 2011-03-08 NOTE — Progress Notes (Signed)
7 Days Post-Op Procedure(s) (LRB): STERNAL WOUND DEBRIDEMENT (N/A) APPLICATION OF WOUND VAC (N/A)  Subjective: Patient without nausea or emesis since yesterday mid morning.  Objective: Vital signs in last 24 hours: Patient Vitals for the past 24 hrs:  BP Temp Temp src Pulse Resp SpO2  03/08/11 0625 92/49 mmHg 97.8 F (36.6 C) Oral 85  19  90 %  03/07/11 2110 106/60 mmHg 97.6 F (36.4 C) Oral 84  18  92 %  03/07/11 1330 94/55 mmHg 98 F (36.7 C) Oral 80  18  94 %    Current Weight  03/05/11 166 lb 3.2 oz (75.388 kg)      Intake/Output from previous day: 01/09 0701 - 01/10 0700 In: 800 [P.O.:600; IV Piggyback:200] Out: 850 [Urine:800; Drains:50]   Physical Exam:  Cardiovascular: RRR, no murmurs, gallops, or rubs. Pulmonary: Clear to auscultation bilaterally; no rales, wheezes, or rhonchi. Abdomen: Soft, non tender, bowel sounds present. Extremities: Mild bilateral lower extremity edema. Wounds: Wound vac in place.  No erythema or signs of infection.  Lab Results: CBC:No results found for this basename: WBC:2,HGB:2,HCT:2,PLT:2 in the last 72 hours BMET: No results found for this basename: NA:2,K:2,CL:2,CO2:2,GLUCOSE:2,BUN:2,CREATININE:2,CALCIUM:2 in the last 72 hours  PT/INR: No results found for this basename: LABPROT,INR in the last 72 hours ABG:  INR: Will add last result for INR, ABG once components are confirmed Will add last 4 CBG results once components are confirmed  Assessment/Plan: 1.Continue wound vac and antibiotic Elita Quick). 2. Stop Vicodin and continue Ultram. 3.Hold Coreg this am secondary to SBP low 90's. 4.Discharge today.   Rickayla Wieland MPA-C 03/08/2011

## 2011-03-12 ENCOUNTER — Ambulatory Visit: Payer: Managed Care, Other (non HMO) | Admitting: Urgent Care

## 2011-03-14 ENCOUNTER — Ambulatory Visit: Payer: Self-pay | Admitting: Gastroenterology

## 2011-03-14 ENCOUNTER — Ambulatory Visit (INDEPENDENT_AMBULATORY_CARE_PROVIDER_SITE_OTHER): Payer: Self-pay | Admitting: Cardiothoracic Surgery

## 2011-03-14 ENCOUNTER — Encounter: Payer: Self-pay | Admitting: Cardiothoracic Surgery

## 2011-03-14 VITALS — BP 124/68 | HR 82 | Resp 16 | Ht 65.0 in | Wt 164.0 lb

## 2011-03-14 DIAGNOSIS — Z9010 Acquired absence of unspecified breast and nipple: Secondary | ICD-10-CM

## 2011-03-14 DIAGNOSIS — I2510 Atherosclerotic heart disease of native coronary artery without angina pectoris: Secondary | ICD-10-CM

## 2011-03-14 DIAGNOSIS — E119 Type 2 diabetes mellitus without complications: Secondary | ICD-10-CM

## 2011-03-14 DIAGNOSIS — T8149XA Infection following a procedure, other surgical site, initial encounter: Secondary | ICD-10-CM

## 2011-03-14 DIAGNOSIS — Z9011 Acquired absence of right breast and nipple: Secondary | ICD-10-CM

## 2011-03-14 DIAGNOSIS — T8140XA Infection following a procedure, unspecified, initial encounter: Secondary | ICD-10-CM

## 2011-03-14 DIAGNOSIS — Z951 Presence of aortocoronary bypass graft: Secondary | ICD-10-CM

## 2011-03-14 NOTE — Discharge Summary (Signed)
patient examined and medical record reviewed,agree with above note. [name],Yidel Teuscher 03/14/2011   

## 2011-03-14 NOTE — Progress Notes (Signed)
Patient ID: [name], female   DOB: Jul 13, 1950, 61 y.o.   MRN: [id number]    Current problems 1 status post CABG times 11/30/2010 for unstable angina #2 superficial sternal wound infection with Serratia sensitive to Rocephin #3 status post wound VAC placement January 2013 with home health nursing  #4 diabetes, status post right mastectomy for cancer  Present illness  Patient returns for a wound check of her superficial sternal infection. A home nurse is doing well back changes Monday was a Friday. She is on IV Rocephin to treat the Serratia positive ulcer from the wound. Her diabetes is under good control. She has no symptoms or signs of CHF.   Exam afebrile blood pressure 120/70 pulse 86 sinus O2 sat 96% General appearance middle-aged white female in no distress Chest clear breath sounds bilaterally Cardiac regular rhythm without murmur or gallop Wound clean granulation tissue 95% small area of fat necrosis in the central area sharply debrided Neuro intact   Laboratory data none today    Plan continue home wound VAC therapy continue IV Rocephin once a day returned office for wound check in one week

## 2011-03-14 NOTE — Patient Instructions (Signed)
Cont daily IV antibiotic until next visit

## 2011-03-14 NOTE — Discharge Summary (Signed)
patient examined and medical record reviewed,agree with above note. [name],[name] 03/14/2011   

## 2011-03-15 NOTE — Progress Notes (Signed)
Clarification of Procedure --sternal debridement  Excsional debridement of full thickness tissue to the pectoralis fascia done for the entire incision.

## 2011-03-19 ENCOUNTER — Ambulatory Visit (HOSPITAL_COMMUNITY): Payer: Managed Care, Other (non HMO) | Admitting: Oncology

## 2011-03-21 ENCOUNTER — Encounter: Payer: Self-pay | Admitting: Cardiothoracic Surgery

## 2011-03-21 ENCOUNTER — Ambulatory Visit (INDEPENDENT_AMBULATORY_CARE_PROVIDER_SITE_OTHER): Payer: Self-pay | Admitting: Cardiothoracic Surgery

## 2011-03-21 VITALS — BP 144/82 | HR 98 | Temp 97.6°F | Resp 16 | Ht 65.0 in | Wt 162.0 lb

## 2011-03-21 DIAGNOSIS — Z951 Presence of aortocoronary bypass graft: Secondary | ICD-10-CM

## 2011-03-21 DIAGNOSIS — T8189XA Other complications of procedures, not elsewhere classified, initial encounter: Secondary | ICD-10-CM

## 2011-03-21 NOTE — Progress Notes (Signed)
HPI                     [address]            [name] [address]          [phone]     The patient returns for a weekly wound VAC exam and change. She had surgery she grow from her sternal incision and is on IV Rocephin. The wound is granulating well. She has had no fever or drainage. She is walking. Her last EF was 40% by 2-D echo. She has no CHF symptoms.  Current Outpatient Prescriptions  Medication Sig Dispense Refill  . aspirin 325 MG tablet Take 325 mg by mouth daily.        . carvedilol (COREG) 3.125 MG tablet Take 1 tablet (3.125 mg total) by mouth 2 (two) times daily.  60 tablet  3  . dextrose 5 % SOLN 50 mL with cefTRIAXone 2 G SOLR 2 g Inject 2 g into the vein daily.  7 each  0  . gabapentin (NEURONTIN) 300 MG capsule Take 300 mg by mouth 3 (three) times daily.        . insulin glargine (LANTUS) 100 UNIT/ML injection Inject 40 Units into the skin 2 (two) times daily. Patient to gradually increase her insulin glargine to pre op dose of 100 units [place] two times daily.  Contact medical doctor for any questions.      . insulin lispro (HUMALOG) 100 UNIT/ML injection Inject 20 Units into the skin 3 (three) times daily before meals. Patient to gradually increase insulin lispro to the pre op dose of 84 units [place] three times daily.  Contact medical doctor for any questions.       . metFORMIN (GLUCOPHAGE) 500 MG tablet Take 500 mg by mouth 2 (two) times daily with a meal.        . Multiple Vitamin (MULTIVITAMIN) tablet Take 1 tablet by mouth daily.      Marland Kitchen PARoxetine (PAXIL) 20 MG tablet Take 20 mg by mouth every morning.        . simvastatin (ZOCOR) 20 MG tablet Take 20 mg by mouth at bedtime.        . folic acid (FOLVITE) 1 MG tablet Take 1 tablet (1 mg total) by mouth daily.  30 tablet  1     Review of Systems: No fever weight has been stable good appetite Physical Exam  Afebrile heart rate 90 sinus blood pressure 110/70\ Breath sounds clear No edema Sternal  wound clean granulation 95% some fat necrosis sharply debrided today   Diagnostic Tests: None  Impression: Granulating superficial sternal wound infection continue with wound VAC and IV antibiotic  Plan: Returned office one week for wound VAC change in exam The patient was given a prescription for tramadol when necessary pain.

## 2011-03-21 NOTE — Patient Instructions (Signed)
Continue IV antibiotics another week Continue home health wound VAC change another week Returned office in one week

## 2011-03-28 ENCOUNTER — Other Ambulatory Visit: Payer: Self-pay | Admitting: *Deleted

## 2011-03-28 ENCOUNTER — Encounter: Payer: Self-pay | Admitting: Cardiothoracic Surgery

## 2011-03-28 ENCOUNTER — Encounter (HOSPITAL_COMMUNITY): Payer: Self-pay | Admitting: Pharmacy Technician

## 2011-03-28 ENCOUNTER — Ambulatory Visit (INDEPENDENT_AMBULATORY_CARE_PROVIDER_SITE_OTHER): Payer: Self-pay | Admitting: Cardiothoracic Surgery

## 2011-03-28 ENCOUNTER — Other Ambulatory Visit: Payer: Self-pay

## 2011-03-28 ENCOUNTER — Other Ambulatory Visit: Payer: Self-pay | Admitting: Cardiothoracic Surgery

## 2011-03-28 VITALS — BP 117/70 | HR 100 | Resp 20 | Ht 65.0 in | Wt 162.0 lb

## 2011-03-28 DIAGNOSIS — I2510 Atherosclerotic heart disease of native coronary artery without angina pectoris: Secondary | ICD-10-CM

## 2011-03-28 DIAGNOSIS — E119 Type 2 diabetes mellitus without complications: Secondary | ICD-10-CM

## 2011-03-28 DIAGNOSIS — T8140XA Infection following a procedure, unspecified, initial encounter: Secondary | ICD-10-CM

## 2011-03-28 DIAGNOSIS — T8132XA Disruption of internal operation (surgical) wound, not elsewhere classified, initial encounter: Secondary | ICD-10-CM

## 2011-03-28 DIAGNOSIS — T8149XA Infection following a procedure, other surgical site, initial encounter: Secondary | ICD-10-CM

## 2011-03-28 DIAGNOSIS — Z951 Presence of aortocoronary bypass graft: Secondary | ICD-10-CM

## 2011-03-28 MED ORDER — DEXTROSE 5 % IV SOLN: 2.0000 g | INTRAVENOUS | Status: DC

## 2011-03-28 NOTE — Progress Notes (Signed)
HPI:                     [address]            [name] [address]          [phone]    Current problems 1 status post CABG times 12/31/2010 for unstable angina, EF 0.40 2 status post prior right mastectomy for carcinoma 3 poorly controlled diabetes mellitus 4 superficial sternal wound infection culture positive for Serratia sensitive to Rocephin 5 exposed sternal wire with surrounding area refractory to healing with wound VAC  The patient returns the office for further wound care and wound VAC change. She is on IV Rocephin daily. She is a wound VAC changes Monday was a Friday. She denies fever. She is stronger and her weight is stable. She states her blood sugars have been better controlled. She denies angina or symptoms of CHF.   Current Outpatient Prescriptions  Medication Sig Dispense Refill  . aspirin 325 MG tablet Take 325 mg by mouth daily.        . carvedilol (COREG) 3.125 MG tablet Take 1 tablet (3.125 mg total) by mouth 2 (two) times daily.  60 tablet  3  . dextrose 5 % SOLN 50 mL with cefTRIAXone 2 G SOLR 2 g Inject 2 g into the vein daily.  7 each  0  . gabapentin (NEURONTIN) 300 MG capsule Take 300 mg by mouth 3 (three) times daily.        . insulin glargine (LANTUS) 100 UNIT/ML injection Inject 40 Units into the skin 2 (two) times daily. Patient to gradually increase her insulin glargine to pre op dose of 100 units [place] two times daily.  Contact medical doctor for any questions.      . insulin lispro (HUMALOG) 100 UNIT/ML injection Inject 20 Units into the skin 3 (three) times daily before meals. Patient to gradually increase insulin lispro to the pre op dose of 84 units [place] three times daily.  Contact medical doctor for any questions.       . metFORMIN (GLUCOPHAGE) 500 MG tablet Take 500 mg by mouth 2 (two) times daily with a meal.        . Multiple Vitamin (MULTIVITAMIN) tablet Take 1 tablet by mouth daily.      Marland Kitchen PARoxetine (PAXIL) 20 MG tablet Take 20 mg  by mouth every morning.        . simvastatin (ZOCOR) 20 MG tablet Take 20 mg by mouth at bedtime.        . traMADol (ULTRAM) 50 MG tablet Take 50 mg by mouth every 6 (six) hours as needed.          Physical Exam: Vital signs afebrile blood pressure 117/70 pulse 96 regular saturation room air 92% weight 162 pounds BMI 26.9 HEENT normocephalic pupils equal Thorax clear breath sounds bilaterally  the sternal incision is clean and granulating except for one area where there is an exposed sternal wire and some surrounding soft granulation tissue. This area is cultured and debridement. The sternal wire will need removal in the OR. Extremities no edema or tenderness Neurologic nonfocal appears wrong overall  Diagnostic Tests: No blood work or chest x-ray today wound culture pending  Impression: Granulating superficial sternal wound infection except for an area surrounding a sternal wire. This will need removal.  Plan: Sternal wire removal under local standby anesthesia in the OR [place] Monday, February 4.

## 2011-03-28 NOTE — Patient Instructions (Signed)
Cont IV antibiotics another week

## 2011-03-29 ENCOUNTER — Ambulatory Visit (HOSPITAL_COMMUNITY)
Admission: RE | Admit: 2011-03-29 | Discharge: 2011-03-29 | Disposition: A | Discharge status: Home / Self Care | Payer: 59 | Source: Ambulatory Visit | Attending: Cardiothoracic Surgery | Admitting: Cardiothoracic Surgery

## 2011-03-29 ENCOUNTER — Encounter (HOSPITAL_COMMUNITY)
Admission: RE | Admit: 2011-03-29 | Discharge: 2011-03-29 | Disposition: A | Discharge status: Home / Self Care | Payer: 59 | Source: Ambulatory Visit | Attending: Cardiothoracic Surgery | Admitting: Cardiothoracic Surgery

## 2011-03-29 ENCOUNTER — Encounter (HOSPITAL_COMMUNITY): Payer: Self-pay

## 2011-03-29 DIAGNOSIS — T8132XA Disruption of internal operation (surgical) wound, not elsewhere classified, initial encounter: Secondary | ICD-10-CM

## 2011-03-29 DIAGNOSIS — Z01810 Encounter for preprocedural cardiovascular examination: Secondary | ICD-10-CM | POA: Insufficient documentation

## 2011-03-29 DIAGNOSIS — Z01818 Encounter for other preprocedural examination: Secondary | ICD-10-CM | POA: Insufficient documentation

## 2011-03-29 DIAGNOSIS — Z01812 Encounter for preprocedural laboratory examination: Secondary | ICD-10-CM | POA: Insufficient documentation

## 2011-03-29 DIAGNOSIS — J90 Pleural effusion, not elsewhere classified: Secondary | ICD-10-CM | POA: Insufficient documentation

## 2011-03-29 HISTORY — DX: Anxiety disorder, unspecified: F41.9

## 2011-03-29 LAB — COMPREHENSIVE METABOLIC PANEL
ALT: 22 U/L (ref 0–35)
AST: 26 U/L (ref 0–37)
Albumin: 3.1 g/dL — ABNORMAL LOW (ref 3.5–5.2)
Alkaline Phosphatase: 85 U/L (ref 39–117)
BUN: 16 mg/dL (ref 6–23)
CO2: 23 mEq/L (ref 19–32)
Calcium: 9.5 mg/dL (ref 8.4–10.5)
Chloride: 101 mEq/L (ref 96–112)
Creatinine, Ser: 0.59 mg/dL (ref 0.50–1.10)
GFR calc Af Amer: >90 mL/min (ref >90)
GFR calc non Af Amer: >90 mL/min (ref >90)
Glucose, Bld: 115 mg/dL — ABNORMAL HIGH (ref 70–99)
Potassium: 4.3 mEq/L (ref 3.5–5.1)
Sodium: 137 mEq/L (ref 135–145)
Total Bilirubin: 0.2 mg/dL — ABNORMAL LOW (ref 0.3–1.2)
Total Protein: 7.5 g/dL (ref 6.0–8.3)

## 2011-03-29 LAB — CBC
HCT: 33.5 % — ABNORMAL LOW (ref 36.0–46.0)
Hemoglobin: 11 g/dL — ABNORMAL LOW (ref 12.0–15.0)
MCH: 27.7 pg (ref 26.0–34.0)
MCHC: 32.8 g/dL (ref 30.0–36.0)
MCV: 84.4 fL (ref 78.0–100.0)
Platelets: 171 10*3/uL (ref 150–400)
RBC: 3.97 MIL/uL (ref 3.87–5.11)
RDW: 13.7 % (ref 11.5–15.5)
WBC: 5.3 10*3/uL (ref 4.0–10.5)

## 2011-03-29 LAB — BLOOD GAS, ARTERIAL
Acid-Base Excess: 2.3 mmol/L — ABNORMAL HIGH (ref 0.0–2.0)
Bicarbonate: 26.6 mEq/L — ABNORMAL HIGH (ref 20.0–24.0)
Drawn by: 206361
FIO2: 0.21 %
O2 Saturation: 95 %
Patient temperature: 98.6
TCO2: 27.9 mmol/L (ref 0–100)
pCO2 arterial: 43.6 mmHg (ref 35.0–45.0)
pH, Arterial: 7.403 — ABNORMAL HIGH (ref 7.350–7.400)
pO2, Arterial: 73.9 mmHg — ABNORMAL LOW (ref 80.0–100.0)

## 2011-03-29 LAB — URINALYSIS, ROUTINE W REFLEX MICROSCOPIC
Bilirubin Urine: NEGATIVE
Glucose, UA: NEGATIVE mg/dL
Hgb urine dipstick: NEGATIVE
Ketones, ur: NEGATIVE mg/dL
Leukocytes, UA: NEGATIVE
Nitrite: NEGATIVE
Protein, ur: NEGATIVE mg/dL
Specific Gravity, Urine: 1.023 (ref 1.005–1.030)
Urobilinogen, UA: 0.2 mg/dL (ref 0.0–1.0)
pH: 5 (ref 5.0–8.0)

## 2011-03-29 LAB — SURGICAL PCR SCREEN
MRSA, PCR: NEGATIVE
Staphylococcus aureus: NEGATIVE

## 2011-03-29 LAB — PROTIME-INR
INR: 1.06 (ref 0.00–1.49)
Prothrombin Time: 14 seconds (ref 11.6–15.2)

## 2011-03-29 LAB — APTT: aPTT: 31 seconds (ref 24–37)

## 2011-03-29 NOTE — Pre-Procedure Instructions (Signed)
20 [name]  03/29/2011   Your procedure is scheduled on:  Monday, February 4th.   Report to [facility] at 11:35 AM.  Call this number if you have problems the morning of surgery: [phone]   Remember:   Do not eat food:After Midnight.  May have clear liquids: up to 4 Hours before arrival.   Clear liquids include soda, tea, black coffee, apple or grape juice, broth.    Take these medicines the morning of surgery with A SIP OF WATER:  Carvdilol, Paroxtine, Tramadol, Gabapentin.   Do not wear jewelry, make-up or nail polish.  Do not wear lotions, powders, or perfumes. You may wear deodorant.  Do not shave 48 hours prior to surgery.  Do not bring valuables to the hospital.  Contacts, dentures or bridgework may not be worn into surgery.  Leave suitcase in the car. After surgery it may be brought to your room.  For patients admitted to the hospital, checkout time is 11:00 AM the day of discharge.   Patients discharged the day of surgery will not be allowed to drive home.  Name and phone number of your driver: --  Special Instructions: Shower with special soap 1/2 bottle the night before and 1/2 bottle the am of - use Chlorhexdine.   Please read over the following fact sheets that you were given: Pain Booklet, Coughing and Deep Breathing, Blood Transfusion Information, MRSA Information and Surgical Site Infection Prevention

## 2011-04-01 LAB — WOUND CULTURE
Gram Stain: NONE SEEN
Gram Stain: NONE SEEN

## 2011-04-01 MED ORDER — DEXTROSE 5 % IV SOLN
1.5000 g | INTRAVENOUS | Status: AC
  Administered 2011-04-02: 1.5 g via INTRAVENOUS
  Filled 2011-04-01: qty 1.5

## 2011-04-02 ENCOUNTER — Encounter (HOSPITAL_COMMUNITY): Payer: Self-pay | Admitting: Anesthesiology

## 2011-04-02 ENCOUNTER — Ambulatory Visit (HOSPITAL_COMMUNITY): Payer: 59

## 2011-04-02 ENCOUNTER — Ambulatory Visit (HOSPITAL_COMMUNITY): Payer: 59 | Admitting: Anesthesiology

## 2011-04-02 ENCOUNTER — Ambulatory Visit (HOSPITAL_COMMUNITY)
Admission: RE | Admit: 2011-04-02 | Discharge: 2011-04-02 | Disposition: A | Discharge status: Home / Self Care | Payer: 59 | Source: Ambulatory Visit | Attending: Cardiothoracic Surgery | Admitting: Cardiothoracic Surgery

## 2011-04-02 ENCOUNTER — Encounter (HOSPITAL_COMMUNITY)
Admission: RE | Disposition: A | Discharge status: Home / Self Care | Payer: Self-pay | Source: Ambulatory Visit | Attending: Cardiothoracic Surgery

## 2011-04-02 DIAGNOSIS — I10 Essential (primary) hypertension: Secondary | ICD-10-CM | POA: Insufficient documentation

## 2011-04-02 DIAGNOSIS — F411 Generalized anxiety disorder: Secondary | ICD-10-CM | POA: Insufficient documentation

## 2011-04-02 DIAGNOSIS — Z01818 Encounter for other preprocedural examination: Secondary | ICD-10-CM | POA: Insufficient documentation

## 2011-04-02 DIAGNOSIS — F3289 Other specified depressive episodes: Secondary | ICD-10-CM | POA: Insufficient documentation

## 2011-04-02 DIAGNOSIS — T8132XA Disruption of internal operation (surgical) wound, not elsewhere classified, initial encounter: Secondary | ICD-10-CM

## 2011-04-02 DIAGNOSIS — F329 Major depressive disorder, single episode, unspecified: Secondary | ICD-10-CM | POA: Insufficient documentation

## 2011-04-02 DIAGNOSIS — T8140XA Infection following a procedure, unspecified, initial encounter: Secondary | ICD-10-CM | POA: Insufficient documentation

## 2011-04-02 DIAGNOSIS — I252 Old myocardial infarction: Secondary | ICD-10-CM | POA: Insufficient documentation

## 2011-04-02 DIAGNOSIS — I509 Heart failure, unspecified: Secondary | ICD-10-CM | POA: Insufficient documentation

## 2011-04-02 DIAGNOSIS — E119 Type 2 diabetes mellitus without complications: Secondary | ICD-10-CM | POA: Insufficient documentation

## 2011-04-02 DIAGNOSIS — I2510 Atherosclerotic heart disease of native coronary artery without angina pectoris: Secondary | ICD-10-CM | POA: Insufficient documentation

## 2011-04-02 HISTORY — PX: STERNAL WIRES REMOVAL: SHX2441

## 2011-04-02 LAB — TYPE AND SCREEN
ABO/RH(D): B POS
Antibody Screen: NEGATIVE

## 2011-04-02 LAB — GLUCOSE, CAPILLARY
Glucose-Capillary: 155 mg/dL — ABNORMAL HIGH (ref 70–99)
Glucose-Capillary: 115 mg/dL — ABNORMAL HIGH (ref 70–99)
Glucose-Capillary: 101 mg/dL — ABNORMAL HIGH (ref 70–99)

## 2011-04-02 SURGERY — REMOVAL, STERNAL WIRE
Anesthesia: Monitor Anesthesia Care | Site: Chest | Wound class: Clean

## 2011-04-02 MED ORDER — FENTANYL CITRATE 0.05 MG/ML IJ SOLN
INTRAMUSCULAR | Status: DC | PRN
  Administered 2011-04-02: 25 ug via INTRAVENOUS
  Administered 2011-04-02: 50 ug via INTRAVENOUS
  Administered 2011-04-02: 25 ug via INTRAVENOUS

## 2011-04-02 MED ORDER — LACTATED RINGERS IV SOLN
INTRAVENOUS | Status: DC
  Administered 2011-04-02: 14:00:00 via INTRAVENOUS

## 2011-04-02 MED ORDER — ONDANSETRON HCL 4 MG/2ML IJ SOLN: 4.0000 mg | Freq: Four times a day (QID) | INTRAMUSCULAR | Status: DC | PRN

## 2011-04-02 MED ORDER — LACTATED RINGERS IV SOLN
INTRAVENOUS | Status: DC | PRN
  Administered 2011-04-02: 14:00:00 via INTRAVENOUS

## 2011-04-02 MED ORDER — SODIUM CHLORIDE 0.9 % IR SOLN
Status: DC | PRN
  Administered 2011-04-02 (×2): 1000 mL

## 2011-04-02 MED ORDER — PROPOFOL 10 MG/ML IV EMUL
INTRAVENOUS | Status: DC | PRN
  Administered 2011-04-02: 50 ug/kg/min via INTRAVENOUS

## 2011-04-02 MED ORDER — ONDANSETRON HCL 4 MG/2ML IJ SOLN
INTRAMUSCULAR | Status: DC | PRN
  Administered 2011-04-02: 4 mg via INTRAVENOUS

## 2011-04-02 MED ORDER — MIDAZOLAM HCL 5 MG/5ML IJ SOLN
INTRAMUSCULAR | Status: DC | PRN
  Administered 2011-04-02: 2 mg via INTRAVENOUS

## 2011-04-02 MED ORDER — LIDOCAINE HCL (PF) 1 % IJ SOLN
INTRAMUSCULAR | Status: DC | PRN
  Administered 2011-04-02: 4 mL

## 2011-04-02 MED ORDER — HYDROMORPHONE HCL PF 1 MG/ML IJ SOLN: 0.2500 mg | INTRAMUSCULAR | Status: DC | PRN

## 2011-04-02 SURGICAL SUPPLY — 52 items
ATTRACTOMAT 16X20 MAGNETIC DRP (DRAPES) ×3 IMPLANT
BAG DECANTER FOR FLEXI CONT (MISCELLANEOUS) ×3 IMPLANT
BENZOIN TINCTURE PRP APPL 2/3 (GAUZE/BANDAGES/DRESSINGS) IMPLANT
BLADE SURG 10 STRL SS (BLADE) ×9 IMPLANT
CANISTER SUCTION 2500CC (MISCELLANEOUS) ×3 IMPLANT
CLIP TI WIDE RED SMALL 24 (CLIP) IMPLANT
CLOTH BEACON ORANGE TIMEOUT ST (SAFETY) ×3 IMPLANT
CONT SPEC 4OZ CLIKSEAL STRL BL (MISCELLANEOUS) IMPLANT
DRAPE LAPAROSCOPIC ABDOMINAL (DRAPES) ×3 IMPLANT
DRAPE SLUSH MACHINE 52X66 (DRAPES) IMPLANT
DRSG VAC ATS MED SENSATRAC (GAUZE/BANDAGES/DRESSINGS) ×3 IMPLANT
ELECT REM PT RETURN 9FT ADLT (ELECTROSURGICAL) ×3
ELECTRODE REM PT RTRN 9FT ADLT (ELECTROSURGICAL) ×1 IMPLANT
GAUZE XEROFORM 5X9 LF (GAUZE/BANDAGES/DRESSINGS) IMPLANT
GLOVE BIO SURGEON STRL SZ 6.5 (GLOVE) IMPLANT
GLOVE BIO SURGEON STRL SZ7.5 (GLOVE) ×6 IMPLANT
GLOVE BIO SURGEONS STRL SZ 6.5 (GLOVE)
GLOVE BIOGEL PI IND STRL 7.0 (GLOVE) ×1 IMPLANT
GLOVE BIOGEL PI IND STRL 7.5 (GLOVE) ×2 IMPLANT
GLOVE BIOGEL PI INDICATOR 7.0 (GLOVE) ×2
GLOVE BIOGEL PI INDICATOR 7.5 (GLOVE) ×4
GLOVE SURG SS PI 7.5 STRL IVOR (GLOVE) ×3 IMPLANT
GOWN PREVENTION PLUS XLARGE (GOWN DISPOSABLE) ×6 IMPLANT
GOWN STRL NON-REIN LRG LVL3 (GOWN DISPOSABLE) ×3 IMPLANT
HANDPIECE INTERPULSE COAX TIP (DISPOSABLE) ×2
HEMOSTAT POWDER SURGIFOAM 1G (HEMOSTASIS) IMPLANT
HEMOSTAT SURGICEL 2X14 (HEMOSTASIS) IMPLANT
KIT BASIN OR (CUSTOM PROCEDURE TRAY) ×3 IMPLANT
KIT ROOM TURNOVER OR (KITS) ×3 IMPLANT
MARKER SKIN DUAL TIP RULER LAB (MISCELLANEOUS) IMPLANT
NEEDLE HYPO 25GX1X1/2 BEV (NEEDLE) ×3 IMPLANT
NS IRRIG 1000ML POUR BTL (IV SOLUTION) ×3 IMPLANT
PACK CHEST (CUSTOM PROCEDURE TRAY) ×3 IMPLANT
PAD ARMBOARD 7.5X6 YLW CONV (MISCELLANEOUS) ×6 IMPLANT
SET HNDPC FAN SPRY TIP SCT (DISPOSABLE) ×1 IMPLANT
SOLUTION BETADINE 4OZ (MISCELLANEOUS) IMPLANT
SPONGE GAUZE 4X4 12PLY (GAUZE/BANDAGES/DRESSINGS) ×3 IMPLANT
SPONGE LAP 18X18 X RAY DECT (DISPOSABLE) ×3 IMPLANT
SUT ETHILON 3 0 FSL (SUTURE) IMPLANT
SUT STEEL 6MS V (SUTURE) IMPLANT
SUT STEEL STERNAL CCS#1 18IN (SUTURE) IMPLANT
SUT STEEL SZ 6 DBL 3X14 BALL (SUTURE) IMPLANT
SUT VIC AB 1 CTX 36 (SUTURE)
SUT VIC AB 1 CTX36XBRD ANBCTR (SUTURE) IMPLANT
SUT VIC AB 2-0 CTX 27 (SUTURE) IMPLANT
SUT VIC AB 3-0 X1 27 (SUTURE) IMPLANT
SWAB COLLECTION DEVICE MRSA (MISCELLANEOUS) IMPLANT
SYR CONTROL 10ML LL (SYRINGE) ×3 IMPLANT
TOWEL OR 17X24 6PK STRL BLUE (TOWEL DISPOSABLE) ×3 IMPLANT
TOWEL OR 17X26 10 PK STRL BLUE (TOWEL DISPOSABLE) ×3 IMPLANT
TUBE ANAEROBIC SPECIMEN COL (MISCELLANEOUS) IMPLANT
WATER STERILE IRR 1000ML POUR (IV SOLUTION) ×3 IMPLANT

## 2011-04-02 NOTE — Progress Notes (Signed)
Per dr. Morton Peters xray ok and pt ok to go to short stay.

## 2011-04-02 NOTE — Anesthesia Preprocedure Evaluation (Signed)
Anesthesia Evaluation  Patient identified by MRN, date of birth, ID band Patient awake    Reviewed: Allergy & Precautions, H&P , NPO status , Patient's Chart, lab work & pertinent test results  Airway Mallampati: II  Neck ROM: full    Dental   Pulmonary former smoker         Cardiovascular hypertension, + angina + CAD, + Past MI and +CHF     Neuro/Psych PSYCHIATRIC DISORDERS Anxiety Depression  Neuromuscular disease    GI/Hepatic PUD,   Endo/Other  Diabetes mellitus-, Type 2  Renal/GU      Musculoskeletal   Abdominal   Peds  Hematology   Anesthesia Other Findings   Reproductive/Obstetrics                           Anesthesia Physical Anesthesia Plan  ASA: III  Anesthesia Plan: MAC   Post-op Pain Management:    Induction: Intravenous  Airway Management Planned: Simple Face Mask  Additional Equipment:   Intra-op Plan:   Post-operative Plan:   Informed Consent: I have reviewed the patients History and Physical, chart, labs and discussed the procedure including the risks, benefits and alternatives for the proposed anesthesia with the patient or authorized representative who has indicated his/her understanding and acceptance.     Plan Discussed with: CRNA and Surgeon  Anesthesia Plan Comments:         Anesthesia Quick Evaluation

## 2011-04-02 NOTE — Op Note (Signed)
Operative note  Procedure  Removal of sternal wires (2)  Surgeon.Kerin Perna M.D.  Anesthesia  Conscious sedation with IV medication as well as local 1% lidocaine  Pre-and postop diagnosis  Superficial sternal wound infection and a 61 year old diabetic status post mastectomy  Operative procedure  The patient was brought to the operating room and placed supine on the or table. The chest was prepped and draped. A proper timeout was performed. Local 1% lidocaine was infiltrated in the lower third of the sternal incision. Granulation tissue was removed and to expose sternal wires were mobilized, divided, and removed. Some soft granulation tissue was cleaned out with a curet. There is no identifiable pus. The sternum was healing with the exception of a lower fracture. The pulse lavage device is used to irrigate the wound. Hemostasis was adequate. A new wound VAC sponge was placed and covered with a sterile dressing connected to suction. Patient return to the PACU in stable condition.

## 2011-04-02 NOTE — Progress Notes (Signed)
Reviewed pt's d/c instructions with pt and family who verbalized understanding.  Pt d/c home via wheelchair with family

## 2011-04-02 NOTE — Progress Notes (Signed)
The patient was examined and preop studies reviewed. There has been no change from the prior exam and the patient is ready for surgery.  

## 2011-04-02 NOTE — Progress Notes (Signed)
Wound vac intact.

## 2011-04-02 NOTE — Preoperative (Signed)
Beta Blockers   Reason not to administer Beta Blockers:Not Applicable 

## 2011-04-02 NOTE — Transfer of Care (Signed)
Immediate Anesthesia Transfer of Care Note  Patient: [name]  Procedure(s) Performed:  STERNAL WIRES REMOVAL  Patient Location: PACU  Anesthesia Type: MAC  Level of Consciousness: awake, alert  and oriented  Airway & Oxygen Therapy: Patient Spontanous Breathing and Patient connected to face mask oxygen  Post-op Assessment: Report given to PACU RN, Post -op Vital signs reviewed and stable and Patient moving all extremities X 4  Post vital signs: Reviewed and stable  Complications: No apparent anesthesia complications

## 2011-04-02 NOTE — Anesthesia Procedure Notes (Signed)
Procedure Name: MAC Date/Time: 04/02/2011 3:28 PM Performed by: Elon Alas Pre-anesthesia Checklist: Patient identified, Timeout performed, Emergency Drugs available, Suction available and Patient being monitored Patient Re-evaluated:Patient Re-evaluated prior to inductionOxygen Delivery Method: Simple face mask Placement Confirmation: positive ETCO2

## 2011-04-02 NOTE — Anesthesia Postprocedure Evaluation (Signed)
Anesthesia Post Note  Patient: [name]  Procedure(s) Performed:  STERNAL WIRES REMOVAL  Anesthesia type: general  Patient location: PACU  Post pain: Pain level controlled  Post assessment: Patient's Cardiovascular Status Stable  Last Vitals:  Filed Vitals:   04/02/11 1710  BP: 127/57  Pulse:   Temp: 36.2 C  Resp:     Post vital signs: Reviewed and stable  Level of consciousness: sedated  Complications: No apparent anesthesia complications

## 2011-04-02 NOTE — Brief Op Note (Signed)
04/02/2011  4:21 PM  PATIENT:  Adline Peals  61 y.o. female  PRE-OPERATIVE DIAGNOSIS:  EXPOSED STERNAL WIRE  POST-OPERATIVE DIAGNOSIS:  Exposed Sternal Wire  PROCEDURE:  Procedure(s): STERNAL WIRES REMOVAL  SURGEON:  Surgeon(s): Kathlee Nations Trigt III, MD  PHYSICIAN ASSISTANT:none   ANESTHESIA:   Local with IV conscious  sedation  EBL:   none  BLOOD ADMINISTERED:  DRAINS: wound VAC  LOCAL MEDICATIONS USED:    SPECIMEN:    DISPOSITION OF SPECIMEN:    COUNTS:none TOURNIQUET:  none  DICTATION: pending  PLAN OF CARE: Discharge to home after PACU  PATIENT DISPOSITION:  Short Stay   Delay start of Pharmacological VTE agent (>24hrs) due to surgical blood loss or risk of bleeding:yes

## 2011-04-03 ENCOUNTER — Encounter (HOSPITAL_COMMUNITY): Payer: 59 | Attending: Oncology | Admitting: Oncology

## 2011-04-03 ENCOUNTER — Encounter (HOSPITAL_COMMUNITY): Payer: Self-pay | Admitting: Cardiothoracic Surgery

## 2011-04-03 DIAGNOSIS — D649 Anemia, unspecified: Secondary | ICD-10-CM

## 2011-04-03 DIAGNOSIS — K7460 Unspecified cirrhosis of liver: Secondary | ICD-10-CM

## 2011-04-03 DIAGNOSIS — D696 Thrombocytopenia, unspecified: Secondary | ICD-10-CM

## 2011-04-03 DIAGNOSIS — R161 Splenomegaly, not elsewhere classified: Secondary | ICD-10-CM

## 2011-04-03 NOTE — Progress Notes (Signed)
This office note has been dictated.

## 2011-04-03 NOTE — Progress Notes (Signed)
CC:   [name], M.D.  DIAGNOSES: 1. Thrombocytopenia which has resolved. 2. 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 3. History of coronary artery disease. 4. Diabetes mellitus. 5. Hyperlipidemia. 6. History of Right sided breast cancer in 1988 status post surgery and     chemotherapy. 7. History of myocardial infarction. 8. History of possible rheumatoid arthritis. 9. History of peripheral neuropathy. 10.History of peptic ulcer disease in 1975. 11.History tubal ligation. 12.History of tonsillectomy and adenoidectomy in 1979. 13.History of abdominal hysterectomy in 1989. 14.History of chronic left lower quadrant abdominal discomfort off and     on for 5 years with a negative workup by Dr. Sheryn Bison in     2007 including CT of the abdomen and pelvis at that time which did     reveal hepatomegaly with fatty infiltration of the liver and spleen     at the upper limits of normal size versus slightly enlarged at that     time.  HISTORY:  We first saw a [name] in the hospital for thrombocytopenia and anemia.  She had a cardiac bypass grafting in November 2012.  She received 5 units of blood at that time, it sounds like, and was admitted here after developing severe weakness, fatigue.  She does have an open sternal wound which now has a VAC drain on it.  She was also admitted here with bradycardia and a CHF exacerbation.  She does have a history of postoperative atrial fibrillation which has resolved.  She did have a workup in the hospital for her anemia and thrombocytopenia.  Her B12, folic acid levels were not compromised.  She was not truly found to  have iron-deficiency either but again she had been transfused in November.  Her B12 level was checked on December 4th which was 1274, folic acid was greater than 20, ferritin was 258.  Serum iron was 33, TIBC 381 with a percent saturation of 9 which could have been  indicative of either iron deficiency or just anemia of chronic disease though her ferritin was fine.  She had blood work done just the other day, however, which is markedly improved.  Her hemoglobin is 11 g, white count 5300, platelets 171,000, so we have a mild normocytic anemia at this point in time.  She certainly has reasons to be mildly anemic, namely diabetes mellitus, she is in a postoperative state, she had a postoperative wound issue. So what I would like to do with her is, after examining her which shows no obvious splenomegaly, there is fullness in the left upper quadrant but I cannot feel a distinct splenic edge, I personally cannot feel her liver to be enlarged and her wound is covered with a wound VAC dressing and her right chest wall is clear without obvious evidence of recurrent local disease, I would like to bring her back in 4 months which will be June, repeat her CBC, do a iron TIBC and ferritin check at that time to see if she has come back to normal and stayed normal.  I think going forward one of her biggest issues is worsening splenomegaly which could occur if she gets ongoing worsening of her liver disease, namely the cirrhosis which is from fatty infiltration of the liver. She promises to work on her weight and aim for ideal weight or close to it.  We will see her then.  She is fine with this plan and she is going  to take a multivitamin with iron which she has at home.    ______________________________ [name]. [name] Sleet, MD ESN/MEDQ  D:  04/03/2011  T:  04/03/2011  Job:  [id number]

## 2011-04-03 NOTE — Patient Instructions (Signed)
[name]  [id number] [birth year]   [facility]  Discharge Instructions  RECOMMENDATIONS MADE BY THE CONSULTANT AND ANY TEST RESULTS WILL BE SENT TO YOUR REFERRING DOCTOR.   EXAM FINDINGS BY MD TODAY AND SIGNS AND SYMPTOMS TO REPORT TO CLINIC OR PRIMARY MD: You are much better.  Will see you back in June for blood work and to see MD a week later.  MEDICATIONS PRESCRIBED: none   INSTRUCTIONS GIVEN AND DISCUSSED: Other :  Report any bleeding, shortness of breath, etc.  SPECIAL INSTRUCTIONS/FOLLOW-UP: Lab work Needed in June  and Return to Clinic to see MD a week later.   I acknowledge that I have been informed and understand all the instructions given to me and received a copy. I do not have any more questions at this time, but understand that I may call the Specialty Clinic at [facility] at [phone] during business hours should I have any further questions or need assistance in obtaining follow-up care.    __________________________________________  _____________  __________ Signature of Patient or Authorized Representative            Date                   Time    __________________________________________ Nurse's Signature

## 2011-04-05 ENCOUNTER — Ambulatory Visit (INDEPENDENT_AMBULATORY_CARE_PROVIDER_SITE_OTHER): Payer: 59 | Admitting: Cardiology

## 2011-04-05 ENCOUNTER — Encounter: Payer: Self-pay | Admitting: Cardiology

## 2011-04-05 VITALS — BP 117/71 | HR 90 | Resp 18 | Ht 65.0 in | Wt 163.0 lb

## 2011-04-05 DIAGNOSIS — I5022 Chronic systolic (congestive) heart failure: Secondary | ICD-10-CM

## 2011-04-05 DIAGNOSIS — I10 Essential (primary) hypertension: Secondary | ICD-10-CM

## 2011-04-05 DIAGNOSIS — I2510 Atherosclerotic heart disease of native coronary artery without angina pectoris: Secondary | ICD-10-CM

## 2011-04-05 DIAGNOSIS — E785 Hyperlipidemia, unspecified: Secondary | ICD-10-CM

## 2011-04-05 MED ORDER — SIMVASTATIN 20 MG PO TABS: 20.0000 mg | ORAL_TABLET | Freq: Every day | ORAL | Status: DC

## 2011-04-05 NOTE — Patient Instructions (Signed)
**Note De-identified Vinisha Faxon Obfuscation** Your physician recommends that you continue on your current medications as directed. Please refer to the Current Medication list given to you today.  Your physician recommends that you schedule a follow-up appointment in: 6 weeks  

## 2011-04-05 NOTE — Assessment & Plan Note (Signed)
Refill for simvastatin provided. LDL 78 in 11/12.

## 2011-04-05 NOTE — Progress Notes (Signed)
Clinical Summary [name] is a 61 y.o.female presenting for followup. She is here with her husband, doing fairly well. She has been undergoing treatment for a sternal wound infection, recently had sternal wire removed, and is on antibiotics with wound VAC in place.  She reports no angina and has had no worsening dyspnea. No palpitations or edema. Has not needed any diuretics.  No fevers or chills. Appetite stable. Weights fluctuating by only a pound or so. No orthopnea.    Allergies  Allergen Reactions  . Clopidogrel Bisulfate Hives  . Codeine Nausea And Vomiting  . Morphine Nausea And Vomiting    Current Outpatient Prescriptions  Medication Sig Dispense Refill  . aspirin 325 MG tablet Take 325 mg by mouth daily.       . carvedilol (COREG) 3.125 MG tablet Take 3.125 mg by mouth 2 (two) times daily.      . cefTRIAXone (ROCEPHIN) 2 G injection Inject into the muscle.      Marland Kitchen dextrose 5 % SOLN 50 mL with cefTRIAXone 2 G SOLR 2 g Inject 2 g into the vein daily.      Marland Kitchen gabapentin (NEURONTIN) 300 MG capsule Take 300 mg by mouth 3 (three) times daily.       . insulin glargine (LANTUS) 100 UNIT/ML injection Inject 40 Units into the skin 2 (two) times daily. Patient to gradually increase her insulin glargine to pre op dose of 100 units [place] two times daily.  Contact medical doctor for any questions.      . insulin lispro (HUMALOG) 100 UNIT/ML injection Inject 20 Units into the skin 3 (three) times daily before meals. Patient to gradually increase insulin lispro to the pre op dose of 84 units [place] three times daily.  Contact medical doctor for any questions.      . metFORMIN (GLUCOPHAGE) 500 MG tablet Take 500 mg by mouth 2 (two) times daily with a meal.       . Multiple Vitamin (MULITIVITAMIN WITH MINERALS) TABS Take 1 tablet by mouth daily.      Marland Kitchen PARoxetine (PAXIL) 20 MG tablet Take 20 mg by mouth every morning.       . simvastatin (ZOCOR) 20 MG tablet Take 1 tablet (20 mg total) by mouth at  bedtime.  90 tablet  1  . traMADol (ULTRAM) 50 MG tablet Take 50 mg by mouth every 6 (six) hours as needed. For pain        Past Medical History  Diagnosis Date  . Coronary atherosclerosis of native coronary artery     BMS circ 2001, DES LAD 2005, DES LAD and RCA 2008, subsequent CABG  . Drug allergy     Allergy to Plavix (hives) - took Ticlid in past  . Essential hypertension, benign   . Mixed hyperlipidemia   . Type 2 diabetes mellitus   . Myocardial infarction ~ 2002  . Arthritis     rheumatoid  . Depression   . Peripheral neuropathy   . Peptic ulcer disease ~ 1975  . Nephrolithiasis 02/07/2011  . Angina   . Breast cancer     Chemo and mastectomy- right  . Diabetes mellitus   . Chronic systolic heart failure     LVEF 40-45%  . Anxiety   . Sternal wound infection     Past Surgical History  Procedure Date  . Mastectomy 1988    right  . Cholecystectomy 1969  . Abdominal hysterectomy 1989  . Tonsillectomy and adenoidectomy 1979  .  Appendectomy 1969  . Tubal ligation 1972  . Coronary artery bypass graft 01/11/2011    Procedure: CORONARY ARTERY BYPASS GRAFTING (CABG);  Surgeon: Kathlee Nations Suann Larry, MD;  Location: [facility] OR;  Service: Open Heart Surgery;  Laterality: N/A;  cabg x four, using left internal mammary artery and right leg greater saphenous vein harvestede endoscopically  . Sternal wound debridement 03/01/2011    Procedure: STERNAL WOUND DEBRIDEMENT;  Surgeon: Kathlee Nations Suann Larry, MD;  Location: [facility] OR;  Service: Open Heart Surgery;  Laterality: N/A;  . Application of wound vac 03/01/2011    Procedure: APPLICATION OF WOUND VAC;  Surgeon: Kathlee Nations Suann Larry, MD;  Location: [facility] OR;  Service: Open Heart Surgery;  Laterality: N/A;  . Breast surgery   . Sternal wires removal 04/02/2011    Procedure: STERNAL WIRES REMOVAL;  Surgeon: Kathlee Nations Suann Larry, MD;  Location: [facility] OR;  Service: Open Heart Surgery;  Laterality: N/A;    Family History  Problem Relation Age of Onset  .  Anesthesia problems Neg Hx     Social History [name] reports that she quit smoking about 4 years ago. Her smoking use included Cigarettes. She has a 20 pack-year smoking history. She has never used smokeless tobacco. [name] reports that she does not drink alcohol.  Review of Systems As outlined above, otherwise negative.  Physical Examination Filed Vitals:   04/05/11 1310  BP: 117/71  Pulse: 90  Resp: 18   Normally nourished appearing in NAD. HEENT: Conjunctiva and lids normal, oropharynx clear with moist mucosa.  Neck: Supple, no elevated JVP or carotid bruits, no thyromegaly.  Lungs: Clear to auscultation, diminished at bases, nonlabored breathing at rest.  Cardiac: Regular rate and rhythm, no S3 or pericardial rub.  Thorax: Wound VAC in place. Abdomen: Nontender, bowel sounds present, no guarding or rebound.  Extremities: No pitting edema, distal pulses1+. Has PICC line. Skin: Warm and dry.  Musculoskeletal: No kyphosis.  Neuropsychiatric: Alert and oriented x3, affect grossly appropriate.    Problem List and Plan

## 2011-04-05 NOTE — Assessment & Plan Note (Signed)
No angina status post CABG. Continue present medications. Sternal wire removed recently and she continues on antibiotics for sternal wound infection with wound VAC in place - following closely with Dr. Donata Clay.

## 2011-04-05 NOTE — Assessment & Plan Note (Signed)
Euvolemic, LVEF 40-45%. No further up-titration of Coreg now.

## 2011-04-05 NOTE — Assessment & Plan Note (Signed)
Blood pressure well controlled

## 2011-04-11 ENCOUNTER — Encounter: Payer: Self-pay | Admitting: Cardiothoracic Surgery

## 2011-04-11 ENCOUNTER — Ambulatory Visit (INDEPENDENT_AMBULATORY_CARE_PROVIDER_SITE_OTHER): Payer: Self-pay | Admitting: Cardiothoracic Surgery

## 2011-04-11 VITALS — BP 135/77 | HR 100 | Temp 97.6°F | Resp 18 | Ht 65.0 in | Wt 160.0 lb

## 2011-04-11 DIAGNOSIS — T8140XA Infection following a procedure, unspecified, initial encounter: Secondary | ICD-10-CM

## 2011-04-11 DIAGNOSIS — Z951 Presence of aortocoronary bypass graft: Secondary | ICD-10-CM

## 2011-04-11 DIAGNOSIS — I2510 Atherosclerotic heart disease of native coronary artery without angina pectoris: Secondary | ICD-10-CM

## 2011-04-11 NOTE — Progress Notes (Signed)
PCP is Leone Payor, MD, MD Referring Provider is Leone Payor, MD  Chief Complaint  Patient presents with  . Routine Post Op    S/P sternal wire removal 04/02/2011                     [address]            [name] [id number]          [phone]      HPI: The patient returns for a wound check of her superficial sternal infection. Last week to the sternal wires were removed. She continues with home wound VAC changes. She has Serratia grown from the wound for which he is on ceftriaxone 2 g IV daily via PICC line.   Past Medical History  Diagnosis Date  . Coronary atherosclerosis of native coronary artery     BMS circ 2001, DES LAD 2005, DES LAD and RCA 2008, subsequent CABG  . Drug allergy     Allergy to Plavix (hives) - took Ticlid in past  . Essential hypertension, benign   . Mixed hyperlipidemia   . Type 2 diabetes mellitus   . Myocardial infarction ~ 2002  . Arthritis     rheumatoid  . Depression   . Peripheral neuropathy   . Peptic ulcer disease ~ 1975  . Nephrolithiasis 02/07/2011  . Angina   . Breast cancer     Chemo and mastectomy- right  . Diabetes mellitus   . Chronic systolic heart failure     LVEF 40-45%  . Anxiety   . Sternal wound infection     Past Surgical History  Procedure Date  . Mastectomy 1988    right  . Cholecystectomy 1969  . Abdominal hysterectomy 1989  . Tonsillectomy and adenoidectomy 1979  . Appendectomy 1969  . Tubal ligation 1972  . Coronary artery bypass graft 01/11/2011    Procedure: CORONARY ARTERY BYPASS GRAFTING (CABG);  Surgeon: Kathlee Nations Suann Larry, MD;  Location: [facility] OR;  Service: Open Heart Surgery;  Laterality: N/A;  cabg x four, using left internal mammary artery and right leg greater saphenous vein harvestede endoscopically  . Sternal wound debridement 03/01/2011    Procedure: STERNAL WOUND DEBRIDEMENT;  Surgeon: Kathlee Nations Suann Larry, MD;  Location: [facility] OR;  Service: Open Heart Surgery;  Laterality:  N/A;  . Application of wound vac 03/01/2011    Procedure: APPLICATION OF WOUND VAC;  Surgeon: Kathlee Nations Suann Larry, MD;  Location: [facility] OR;  Service: Open Heart Surgery;  Laterality: N/A;  . Breast surgery   . Sternal wires removal 04/02/2011    Procedure: STERNAL WIRES REMOVAL;  Surgeon: Kathlee Nations Suann Larry, MD;  Location: [facility] OR;  Service: Open Heart Surgery;  Laterality: N/A;    Family History  Problem Relation Age of Onset  . Anesthesia problems Neg Hx     Social History History  Substance Use Topics  . Smoking status: Former Smoker -- 1.0 packs/day for 20 years    Types: Cigarettes    Quit date: 02/27/2007  . Smokeless tobacco: Never Used  . Alcohol Use: No    Current Outpatient Prescriptions  Medication Sig Dispense Refill  . aspirin 325 MG tablet Take 325 mg by mouth daily.       . carvedilol (COREG) 3.125 MG tablet Take 3.125 mg by mouth 2 (two) times daily.      Marland Kitchen dextrose 5 % SOLN 50 mL with cefTRIAXone 2 G SOLR 2  g Inject 2 g into the vein daily.      Marland Kitchen gabapentin (NEURONTIN) 300 MG capsule Take 300 mg by mouth 3 (three) times daily.       . insulin glargine (LANTUS) 100 UNIT/ML injection Inject 40 Units into the skin 2 (two) times daily. Patient to gradually increase her insulin glargine to pre op dose of 100 units [place] two times daily.  Contact medical doctor for any questions.      . insulin lispro (HUMALOG) 100 UNIT/ML injection Inject 20 Units into the skin 3 (three) times daily before meals. Patient to gradually increase insulin lispro to the pre op dose of 84 units [place] three times daily.  Contact medical doctor for any questions.      . metFORMIN (GLUCOPHAGE) 500 MG tablet Take 500 mg by mouth 2 (two) times daily with a meal.       . Multiple Vitamin (MULITIVITAMIN WITH MINERALS) TABS Take 1 tablet by mouth daily.      Marland Kitchen PARoxetine (PAXIL) 20 MG tablet Take 20 mg by mouth every morning.       . simvastatin (ZOCOR) 20 MG tablet Take 1 tablet (20 mg total) by mouth at bedtime.   90 tablet  1  . traMADol (ULTRAM) 50 MG tablet Take 50 mg by mouth every 6 (six) hours as needed. For pain        Allergies  Allergen Reactions  . Clopidogrel Bisulfate Hives  . Codeine Nausea And Vomiting  . Morphine Nausea And Vomiting    Review of Systems no fever  BP 135/77  Pulse 100  Temp(Src) 97.6 F (36.4 C) (Oral)  Resp 18  Ht 5\' 5"  (1.651 m)  Wt 160 lb (72.576 kg)  BMI 26.63 kg/m2  SpO2 95%  LMP 01/05/1996 Physical Exam Vital signs as above Breath sounds clear Cardiac rhythm regular Sternum stable with exposed clean granulation tissue measuring approximately 4 cm. Small area of exposed sternal bone which appears clean. No deep sinus tracts. Wound VAC change today  Diagnostic Tests: None  Impression:  Sternal wound infection, superficial healing with secondary intention with wound VAC and IV antibiotics. Continue current treatment plan. Return for followup in one week.

## 2011-04-18 ENCOUNTER — Encounter: Payer: Self-pay | Admitting: Cardiothoracic Surgery

## 2011-04-18 ENCOUNTER — Ambulatory Visit (INDEPENDENT_AMBULATORY_CARE_PROVIDER_SITE_OTHER): Payer: Self-pay | Admitting: Cardiothoracic Surgery

## 2011-04-18 VITALS — BP 130/71 | HR 96 | Temp 97.4°F | Resp 16 | Ht 65.0 in | Wt 163.0 lb

## 2011-04-18 DIAGNOSIS — I2510 Atherosclerotic heart disease of native coronary artery without angina pectoris: Secondary | ICD-10-CM

## 2011-04-18 DIAGNOSIS — Z09 Encounter for follow-up examination after completed treatment for conditions other than malignant neoplasm: Secondary | ICD-10-CM

## 2011-04-18 DIAGNOSIS — S21101A Unspecified open wound of right front wall of thorax without penetration into thoracic cavity, initial encounter: Secondary | ICD-10-CM

## 2011-04-18 NOTE — Progress Notes (Signed)
Patient ID: [name], female   DOB: 1951/01/04, 61 y.o.   MRN: [id number] The patient returns for a wound check and back change. She has superficial sternal wound infection after CABG with previous history of breast cancer mastectomy and radiation therapy. She is a diabetic.  The wound is examined. He has clean granulation tissue except for small area of exposed sternal bone. Overall size is contracting. No drainage or purulence.  Vital signs are stable sinus rhythm blood pressure 128/70 O2 sat 95% afebrile  Plan continue wound VAC therapy          Discontinue IV antibiotics but we've PICC line in for now          Patient can shower before the wound VAC changes with soap and water          Return for wound check and wound VAC change in one week

## 2011-04-25 ENCOUNTER — Encounter: Payer: Self-pay | Admitting: Cardiothoracic Surgery

## 2011-04-25 ENCOUNTER — Ambulatory Visit (INDEPENDENT_AMBULATORY_CARE_PROVIDER_SITE_OTHER): Payer: Self-pay | Admitting: Cardiothoracic Surgery

## 2011-04-25 VITALS — BP 118/67 | HR 93 | Resp 16 | Ht 65.0 in | Wt 150.0 lb

## 2011-04-25 DIAGNOSIS — Z951 Presence of aortocoronary bypass graft: Secondary | ICD-10-CM

## 2011-04-25 DIAGNOSIS — S21101A Unspecified open wound of right front wall of thorax without penetration into thoracic cavity, initial encounter: Secondary | ICD-10-CM

## 2011-04-25 DIAGNOSIS — I2510 Atherosclerotic heart disease of native coronary artery without angina pectoris: Secondary | ICD-10-CM

## 2011-04-25 NOTE — Progress Notes (Signed)
PCP is Leone Payor, MD, MD Referring Provider is Rollene Rotunda, MD  Chief Complaint  Patient presents with  . Routine Post Op    2 week f/u for wound Vac change, S/P CABG    HPI: The patient returns for her  weekly sternal wound check. She has a wound VAC in place with a slowly granulating superficial sternal wound infection. Previous cultures positive for Serratia. She is finished a long-term course of ceftriaxone through a PICC line we removed today. No fevers no drainage no pain. Last EF by echo 45%  Past Medical History  Diagnosis Date  . Coronary atherosclerosis of native coronary artery     BMS circ 2001, DES LAD 2005, DES LAD and RCA 2008, subsequent CABG  . Drug allergy     Allergy to Plavix (hives) - took Ticlid in past  . Essential hypertension, benign   . Mixed hyperlipidemia   . Type 2 diabetes mellitus   . Myocardial infarction ~ 2002  . Arthritis     rheumatoid  . Depression   . Peripheral neuropathy   . Peptic ulcer disease ~ 1975  . Nephrolithiasis 02/07/2011  . Angina   . Breast cancer     Chemo and mastectomy- right  . Diabetes mellitus   . Chronic systolic heart failure     LVEF 40-45%  . Anxiety   . Sternal wound infection     Past Surgical History  Procedure Date  . Mastectomy 1988    right  . Cholecystectomy 1969  . Abdominal hysterectomy 1989  . Tonsillectomy and adenoidectomy 1979  . Appendectomy 1969  . Tubal ligation 1972  . Coronary artery bypass graft 01/11/2011    Procedure: CORONARY ARTERY BYPASS GRAFTING (CABG);  Surgeon: Kathlee Nations Suann Larry, MD;  Location: [facility] OR;  Service: Open Heart Surgery;  Laterality: N/A;  cabg x four, using left internal mammary artery and right leg greater saphenous vein harvestede endoscopically  . Sternal wound debridement 03/01/2011    Procedure: STERNAL WOUND DEBRIDEMENT;  Surgeon: Kathlee Nations Suann Larry, MD;  Location: [facility] OR;  Service: Open Heart Surgery;  Laterality: N/A;  . Application of wound vac  03/01/2011    Procedure: APPLICATION OF WOUND VAC;  Surgeon: Kathlee Nations Suann Larry, MD;  Location: [facility] OR;  Service: Open Heart Surgery;  Laterality: N/A;  . Breast surgery   . Sternal wires removal 04/02/2011    Procedure: STERNAL WIRES REMOVAL;  Surgeon: Kathlee Nations Suann Larry, MD;  Location: [facility] OR;  Service: Open Heart Surgery;  Laterality: N/A;    Family History  Problem Relation Age of Onset  . Anesthesia problems Neg Hx     Social History History  Substance Use Topics  . Smoking status: Former Smoker -- 1.0 packs/day for 20 years    Types: Cigarettes    Quit date: 02/27/2007  . Smokeless tobacco: Never Used  . Alcohol Use: No    Current Outpatient Prescriptions  Medication Sig Dispense Refill  . aspirin 325 MG tablet Take 325 mg by mouth daily.       . carvedilol (COREG) 3.125 MG tablet Take 3.125 mg by mouth 2 (two) times daily.      Marland Kitchen gabapentin (NEURONTIN) 300 MG capsule Take 300 mg by mouth 3 (three) times daily.       . insulin glargine (LANTUS) 100 UNIT/ML injection Inject 40 Units into the skin 2 (two) times daily. Patient to gradually increase her insulin glargine to pre op dose of 100 units  [place] two times daily.  Contact medical doctor for any questions.      . insulin lispro (HUMALOG) 100 UNIT/ML injection Inject 20 Units into the skin 3 (three) times daily before meals. Patient to gradually increase insulin lispro to the pre op dose of 84 units [place] three times daily.  Contact medical doctor for any questions.      . metFORMIN (GLUCOPHAGE) 500 MG tablet Take 500 mg by mouth 2 (two) times daily with a meal.       . Multiple Vitamin (MULITIVITAMIN WITH MINERALS) TABS Take 1 tablet by mouth daily.      Marland Kitchen PARoxetine (PAXIL) 20 MG tablet Take 20 mg by mouth every morning.       . simvastatin (ZOCOR) 20 MG tablet Take 1 tablet (20 mg total) by mouth at bedtime.  90 tablet  1  . traMADol (ULTRAM) 50 MG tablet Take 50 mg by mouth every 6 (six) hours as needed. For pain        Allergies   Allergen Reactions  . Clopidogrel Bisulfate Hives  . Codeine Nausea And Vomiting  . Morphine Nausea And Vomiting    Review of Systems good appetite good energy enjoys taking a shower now  BP 118/67  Pulse 93  Resp 16  Ht 5\' 5"  (1.651 m)  Wt 150 lb (68.04 kg)  BMI 24.96 kg/m2  SpO2 93%  LMP 01/05/1996 Physical Exam Alert and oriented, comfortable Breath sounds clear Cardiac rhythm regular Sternal wound with good granulation tissue 95%. Exposed sternal bone now almost completely covered with granulation tissue. The wound is very near which should be treated with wound VAC with a thin sponge for one more week. PICC line from left arm removed Diagnostic Tests:  None Impression: Return for wound VAC change and wound exam in 1 week--no more antibiotics indicated

## 2011-05-02 ENCOUNTER — Ambulatory Visit (INDEPENDENT_AMBULATORY_CARE_PROVIDER_SITE_OTHER): Payer: Self-pay | Admitting: Cardiothoracic Surgery

## 2011-05-02 ENCOUNTER — Encounter: Payer: Self-pay | Admitting: Cardiothoracic Surgery

## 2011-05-02 VITALS — BP 125/70 | HR 98 | Resp 16 | Ht 65.0 in | Wt 156.0 lb

## 2011-05-02 DIAGNOSIS — L089 Local infection of the skin and subcutaneous tissue, unspecified: Secondary | ICD-10-CM

## 2011-05-02 DIAGNOSIS — Z09 Encounter for follow-up examination after completed treatment for conditions other than malignant neoplasm: Secondary | ICD-10-CM

## 2011-05-02 DIAGNOSIS — I2510 Atherosclerotic heart disease of native coronary artery without angina pectoris: Secondary | ICD-10-CM

## 2011-05-06 NOTE — Patient Instructions (Signed)
Saline wet-to-dry dressings daily We'll ask home health nursing to assist He may take a daily shower

## 2011-05-06 NOTE — Progress Notes (Signed)
PCP is Leone Payor, MD, MD Referring Provider is Rollene Rotunda, MD  Chief Complaint  Patient presents with  . Routine Post Op    1 week wound vac change and  sternal wound inspection    HPI: The patient is a diabetic with a superficial sternal wound infection managed by home wound VAC return for wound check the patient denies fever or drainage or increased pain. She is ready to start outpatient cardiac rehabilitation.   Past Medical History  Diagnosis Date  . Coronary atherosclerosis of native coronary artery     BMS circ 2001, DES LAD 2005, DES LAD and RCA 2008, subsequent CABG  . Drug allergy     Allergy to Plavix (hives) - took Ticlid in past  . Essential hypertension, benign   . Mixed hyperlipidemia   . Type 2 diabetes mellitus   . Myocardial infarction ~ 2002  . Arthritis     rheumatoid  . Depression   . Peripheral neuropathy   . Peptic ulcer disease ~ 1975  . Nephrolithiasis 02/07/2011  . Angina   . Breast cancer     Chemo and mastectomy- right  . Diabetes mellitus   . Chronic systolic heart failure     LVEF 40-45%  . Anxiety   . Sternal wound infection     Past Surgical History  Procedure Date  . Mastectomy 1988    right  . Cholecystectomy 1969  . Abdominal hysterectomy 1989  . Tonsillectomy and adenoidectomy 1979  . Appendectomy 1969  . Tubal ligation 1972  . Coronary artery bypass graft 01/11/2011    Procedure: CORONARY ARTERY BYPASS GRAFTING (CABG);  Surgeon: Kathlee Nations Suann Larry, MD;  Location: [facility] OR;  Service: Open Heart Surgery;  Laterality: N/A;  cabg x four, using left internal mammary artery and right leg greater saphenous vein harvestede endoscopically  . Sternal wound debridement 03/01/2011    Procedure: STERNAL WOUND DEBRIDEMENT;  Surgeon: Kathlee Nations Suann Larry, MD;  Location: [facility] OR;  Service: Open Heart Surgery;  Laterality: N/A;  . Application of wound vac 03/01/2011    Procedure: APPLICATION OF WOUND VAC;  Surgeon: Kathlee Nations Suann Larry, MD;   Location: [facility] OR;  Service: Open Heart Surgery;  Laterality: N/A;  . Breast surgery   . Sternal wires removal 04/02/2011    Procedure: STERNAL WIRES REMOVAL;  Surgeon: Kathlee Nations Suann Larry, MD;  Location: [facility] OR;  Service: Open Heart Surgery;  Laterality: N/A;    Family History  Problem Relation Age of Onset  . Anesthesia problems Neg Hx     Social History History  Substance Use Topics  . Smoking status: Former Smoker -- 1.0 packs/day for 20 years    Types: Cigarettes    Quit date: 02/27/2007  . Smokeless tobacco: Never Used  . Alcohol Use: No    Current Outpatient Prescriptions  Medication Sig Dispense Refill  . aspirin 325 MG tablet Take 325 mg by mouth daily.       . carvedilol (COREG) 3.125 MG tablet Take 3.125 mg by mouth 2 (two) times daily.      Marland Kitchen gabapentin (NEURONTIN) 300 MG capsule Take 300 mg by mouth 3 (three) times daily.       . insulin glargine (LANTUS) 100 UNIT/ML injection Inject 40 Units into the skin 2 (two) times daily. Patient to gradually increase her insulin glargine to pre op dose of 100 units [place] two times daily.  Contact medical doctor for any questions.      Marland Kitchen  insulin lispro (HUMALOG) 100 UNIT/ML injection Inject 20 Units into the skin 3 (three) times daily before meals. Patient to gradually increase insulin lispro to the pre op dose of 84 units  three times daily.  Contact medical doctor for any questions.      . metFORMIN (GLUCOPHAGE) 500 MG tablet Take 500 mg by mouth 2 (two) times daily with a meal.       . Multiple Vitamin (MULITIVITAMIN WITH MINERALS) TABS Take 1 tablet by mouth daily.      Marland Kitchen PARoxetine (PAXIL) 20 MG tablet Take 20 mg by mouth every morning.       . simvastatin (ZOCOR) 20 MG tablet Take 1 tablet (20 mg total) by mouth at bedtime.  90 tablet  1  . traMADol (ULTRAM) 50 MG tablet Take 50 mg by mouth every 6 (six) hours as needed. For pain        Allergies  Allergen Reactions  . Clopidogrel Bisulfate Hives  . Codeine Nausea And Vomiting    . Morphine Nausea And Vomiting    Review of Systems good appetite good strength walking daily ready to stop the wound VAC apparatus  BP 125/70  Pulse 98  Resp 16  Ht 5\' 5"  (1.651 m)  Wt 156 lb (70.761 kg)  BMI 25.96 kg/m2  SpO2 94%  LMP 01/05/1996 Physical Exam Alert and comfortable Breath sounds clear Cardiac rhythm regular Sternal wound granulating no areas of purulence the wound much smaller and contracting  Diagno none Impression: Improved granulating wound that does not need wound VAC but we will transition to what to dry dressing changes  Plan: Return in one week for wound check

## 2011-05-09 ENCOUNTER — Encounter: Payer: Self-pay | Admitting: Cardiothoracic Surgery

## 2011-05-09 ENCOUNTER — Ambulatory Visit (INDEPENDENT_AMBULATORY_CARE_PROVIDER_SITE_OTHER): Payer: 59 | Admitting: Physician Assistant

## 2011-05-09 ENCOUNTER — Ambulatory Visit: Payer: Self-pay | Admitting: Cardiothoracic Surgery

## 2011-05-09 VITALS — BP 104/66 | HR 86 | Resp 18 | Ht 65.0 in | Wt 160.0 lb

## 2011-05-09 DIAGNOSIS — L089 Local infection of the skin and subcutaneous tissue, unspecified: Secondary | ICD-10-CM

## 2011-05-09 DIAGNOSIS — Z9889 Other specified postprocedural states: Secondary | ICD-10-CM

## 2011-05-09 DIAGNOSIS — I2510 Atherosclerotic heart disease of native coronary artery without angina pectoris: Secondary | ICD-10-CM

## 2011-05-09 NOTE — Progress Notes (Signed)
  HPI:  Patient returns for routine postoperative follow-up having undergone removal of 2 sternal wounds (most recently) on 04/02/2011. She previously had a sternal wound debridement and wound vac on 03/07/2011. The patient was last seen in the office on 05/02/2011 for removal of wound vac. At that time, her wound was healing well.   Current Outpatient Prescriptions  Medication Sig Dispense Refill  . aspirin 325 MG tablet Take 325 mg by mouth daily.       . carvedilol (COREG) 3.125 MG tablet Take 3.125 mg by mouth 2 (two) times daily.      Marland Kitchen gabapentin (NEURONTIN) 300 MG capsule Take 300 mg by mouth 3 (three) times daily.       . insulin glargine (LANTUS) 100 UNIT/ML injection Inject 40 Units into the skin 2 (two) times daily. Patient to gradually increase her insulin glargine to pre op dose of 100 units [place] two times daily.  Contact medical doctor for any questions.      . insulin lispro (HUMALOG) 100 UNIT/ML injection Inject 20 Units into the skin 3 (three) times daily before meals. Patient to gradually increase insulin lispro to the pre op dose of 84 units [place] three times daily.  Contact medical doctor for any questions.      . metFORMIN (GLUCOPHAGE) 500 MG tablet Take 500 mg by mouth 2 (two) times daily with a meal.       . Multiple Vitamin (MULITIVITAMIN WITH MINERALS) TABS Take 1 tablet by mouth daily.      Marland Kitchen PARoxetine (PAXIL) 20 MG tablet Take 20 mg by mouth every morning.       . simvastatin (ZOCOR) 20 MG tablet Take 1 tablet (20 mg total) by mouth at bedtime.  90 tablet  1  . traMADol (ULTRAM) 50 MG tablet Take 50 mg by mouth every 6 (six) hours as needed. For pain      Vital Signs:  BP 104/66, HR 86, RR 18, O2 sat 95% on room air  Physical Exam: Cardiovascular:RRR. Pulmonary:Clear. Wound:Slight superficial area of purulence in the middle of the wound.The remainder of the sternal wound is clean and granulating.    Impression and Plan: Her wound is filling in an granulating well. A Q  tip was used to remove the superficial sloughy tissue in the middle of the wound. She has had no fever or chills. Her husband was instructed to continue to use damp gauze with normal saline on the wound and change daily. She will return to see Dr. Donata Clay in one week for a wound check.

## 2011-05-16 ENCOUNTER — Other Ambulatory Visit: Payer: Self-pay | Admitting: Cardiothoracic Surgery

## 2011-05-16 ENCOUNTER — Encounter: Payer: Self-pay | Admitting: Cardiothoracic Surgery

## 2011-05-16 ENCOUNTER — Ambulatory Visit (INDEPENDENT_AMBULATORY_CARE_PROVIDER_SITE_OTHER): Payer: Self-pay | Admitting: Cardiothoracic Surgery

## 2011-05-16 VITALS — BP 124/68 | HR 93 | Resp 16 | Ht 65.0 in | Wt 162.0 lb

## 2011-05-16 DIAGNOSIS — S21101A Unspecified open wound of right front wall of thorax without penetration into thoracic cavity, initial encounter: Secondary | ICD-10-CM

## 2011-05-16 DIAGNOSIS — Z951 Presence of aortocoronary bypass graft: Secondary | ICD-10-CM

## 2011-05-16 NOTE — Progress Notes (Signed)
PCP is Leone Payor, MD, MD Referring Provider is Rollene Rotunda, MD  Chief Complaint  Patient presents with  . Follow-up    sternal wound      .HPI:                                                                 [address]            [name] [address]          [phone]     The patient returns for followup of a superficial sternal infection which previously had grown Serratia. Wet-to-dry dressing changes are being performed at home once daily. There is been no fever.   Past Medical History  Diagnosis Date  . Coronary atherosclerosis of native coronary artery     BMS circ 2001, DES LAD 2005, DES LAD and RCA 2008, subsequent CABG  . Drug allergy     Allergy to Plavix (hives) - took Ticlid in past  . Essential hypertension, benign   . Mixed hyperlipidemia   . Type 2 diabetes mellitus   . Myocardial infarction ~ 2002  . Arthritis     rheumatoid  . Depression   . Peripheral neuropathy   . Peptic ulcer disease ~ 1975  . Nephrolithiasis 02/07/2011  . Angina   . Breast cancer     Chemo and mastectomy- right  . Diabetes mellitus   . Chronic systolic heart failure     LVEF 40-45%  . Anxiety   . Sternal wound infection     Past Surgical History  Procedure Date  . Mastectomy 1988    right  . Cholecystectomy 1969  . Abdominal hysterectomy 1989  . Tonsillectomy and adenoidectomy 1979  . Appendectomy 1969  . Tubal ligation 1972  . Coronary artery bypass graft 01/11/2011    Procedure: CORONARY ARTERY BYPASS GRAFTING (CABG);  Surgeon: Kathlee Nations Suann Larry, MD;  Location: [facility] OR;  Service: Open Heart Surgery;  Laterality: N/A;  cabg x four, using left internal mammary artery and right leg greater saphenous vein harvestede endoscopically  . Sternal wound debridement 03/01/2011    Procedure: STERNAL WOUND DEBRIDEMENT;  Surgeon: Kathlee Nations Suann Larry, MD;  Location: [facility] OR;  Service: Open Heart Surgery;  Laterality: N/A;  . Application of wound vac 03/01/2011    Procedure: APPLICATION OF WOUND VAC;  Surgeon: Kathlee Nations Suann Larry, MD;  Location: [facility] OR;  Service: Open Heart Surgery;  Laterality: N/A;  . Breast surgery   . Sternal wires removal 04/02/2011    Procedure: STERNAL WIRES REMOVAL;  Surgeon: Kathlee Nations Suann Larry, MD;  Location: [facility] OR;  Service: Open Heart Surgery;  Laterality: N/A;    Family History  Problem Relation Age of Onset  . Anesthesia problems Neg Hx     Social History History  Substance Use Topics  . Smoking status: Former Smoker -- 1.0 packs/day for 20 years    Types: Cigarettes    Quit date: 02/27/2007  . Smokeless tobacco: Never Used  . Alcohol Use: No    Current Outpatient Prescriptions  Medication Sig Dispense Refill  . aspirin 325 MG tablet Take 325 mg by mouth daily.       . carvedilol (COREG) 3.125 MG tablet Take 3.125 mg  by mouth 2 (two) times daily.      Marland Kitchen gabapentin (NEURONTIN) 300 MG capsule Take 300 mg by mouth 3 (three) times daily.       . insulin glargine (LANTUS) 100 UNIT/ML injection Inject 40 Units into the skin 2 (two) times daily. Patient to gradually increase her insulin glargine to pre op dose of 100 units [place] two times daily.  Contact medical doctor for any questions.      . insulin lispro (HUMALOG) 100 UNIT/ML injection Inject 20 Units into the skin 3 (three) times daily before meals. Patient to gradually increase insulin lispro to the pre op dose of 84 units [place] three times daily.  Contact medical doctor for any questions.      . metFORMIN (GLUCOPHAGE) 500 MG tablet Take 500 mg by mouth 2 (two) times daily with a meal.       . Multiple Vitamin (MULITIVITAMIN WITH MINERALS) TABS Take 1 tablet by mouth daily.      Marland Kitchen PARoxetine (PAXIL) 20 MG tablet Take 20 mg by mouth every morning.       . simvastatin (ZOCOR) 20 MG tablet Take 1 tablet (20 mg total) by mouth at bedtime.  90 tablet  1  . traMADol (ULTRAM) 50 MG tablet Take 50 mg by mouth every 6 (six) hours as needed. For pain        Allergies  Allergen  Reactions  . Clopidogrel Bisulfate Hives  . Codeine Nausea And Vomiting  . Morphine Nausea And Vomiting    Review of Systems good appetite good energy level no drainage no fever no chest pain  BP 124/68  Pulse 93  Resp 16  Ht 5\' 5"  (1.651 m)  Wt 162 lb (73.483 kg)  BMI 26.96 kg/m2  SpO2 94%  LMP 01/05/1996 Physical Exam Breath sounds clear Pulse regular Sternal incision granulating and closing in nicely. One area of soft granulation tissue treated with silver nitrate cauterization and debridement ---single 4 x 4 gauze wet to dry dressing placed .  Diagnostic Tests: Wound culture obtained  Impression: Healing wound with wet-to-dry dressings  Plan: Continue daily wet-to-dry dressing changes followup in office in one week

## 2011-05-20 LAB — CULTURE, ROUTINE-ABSCESS: Gram Stain: NONE SEEN

## 2011-05-21 ENCOUNTER — Ambulatory Visit (INDEPENDENT_AMBULATORY_CARE_PROVIDER_SITE_OTHER): Payer: 59 | Admitting: Cardiology

## 2011-05-21 ENCOUNTER — Other Ambulatory Visit: Payer: Self-pay | Admitting: *Deleted

## 2011-05-21 ENCOUNTER — Encounter: Payer: Self-pay | Admitting: Cardiology

## 2011-05-21 VITALS — BP 133/76 | HR 92 | Resp 16 | Ht 65.0 in | Wt 163.0 lb

## 2011-05-21 DIAGNOSIS — E1142 Type 2 diabetes mellitus with diabetic polyneuropathy: Secondary | ICD-10-CM

## 2011-05-21 DIAGNOSIS — N390 Urinary tract infection, site not specified: Secondary | ICD-10-CM

## 2011-05-21 DIAGNOSIS — I5022 Chronic systolic (congestive) heart failure: Secondary | ICD-10-CM

## 2011-05-21 DIAGNOSIS — I2510 Atherosclerotic heart disease of native coronary artery without angina pectoris: Secondary | ICD-10-CM

## 2011-05-21 DIAGNOSIS — I10 Essential (primary) hypertension: Secondary | ICD-10-CM

## 2011-05-21 DIAGNOSIS — G909 Disorder of the autonomic nervous system, unspecified: Secondary | ICD-10-CM

## 2011-05-21 DIAGNOSIS — E1149 Type 2 diabetes mellitus with other diabetic neurological complication: Secondary | ICD-10-CM

## 2011-05-21 MED ORDER — CIPROFLOXACIN HCL 250 MG PO TABS
250.0000 mg | ORAL_TABLET | Freq: Two times a day (BID) | ORAL | Status: DC
Start: 2011-05-21 — End: 2011-05-21

## 2011-05-21 MED ORDER — CARVEDILOL 3.125 MG PO TABS: 3.1250 mg | ORAL_TABLET | Freq: Two times a day (BID) | ORAL | Status: DC

## 2011-05-21 NOTE — Assessment & Plan Note (Signed)
Referral being made to Endocrinology.

## 2011-05-21 NOTE — Patient Instructions (Signed)
**Note De-Identified Shekita Boyden Obfuscation** Your physician recommends that you continue on your current medications as directed. Please refer to the Current Medication list given to you today.  Your physician recommends that you schedule a follow-up appointment in: 2 MONTHS  YOUR PHYSICIAN RECOMMENDS THAT YOU SEE AN ENDOCRINOLOGIST, WE WILL ARRANGE

## 2011-05-21 NOTE — Assessment & Plan Note (Signed)
Euvolemic, LVEF 40-45%. No further up-titration of Coreg now. 

## 2011-05-21 NOTE — Progress Notes (Signed)
Clinical Summary Ms. Hasan is a medically complex 61 y.o.female presenting for followup. I saw her back in February. She had a recent visit with Dr. Donata Clay for followup of her sternal wound infection.  She has been doing well in general - no angina or progressive dyspnea. We reviewed her medications.  Her weight is relatively stable as is appetite.  No fevers or chills.   Allergies  Allergen Reactions  . Clopidogrel Bisulfate Hives  . Codeine Nausea And Vomiting  . Morphine Nausea And Vomiting    Current Outpatient Prescriptions  Medication Sig Dispense Refill  . aspirin 325 MG tablet Take 325 mg by mouth daily.       . carvedilol (COREG) 3.125 MG tablet Take 1 tablet (3.125 mg total) by mouth 2 (two) times daily.  180 tablet  3  . gabapentin (NEURONTIN) 300 MG capsule Take 300 mg by mouth 3 (three) times daily.       . insulin glargine (LANTUS) 100 UNIT/ML injection Inject 40 Units into the skin 2 (two) times daily. Patient to gradually increase her insulin glargine to pre op dose of 100 units [place] two times daily.  Contact medical doctor for any questions.      . insulin lispro (HUMALOG) 100 UNIT/ML injection Inject 20 Units into the skin 3 (three) times daily before meals. Patient to gradually increase insulin lispro to the pre op dose of 84 units [place] three times daily.  Contact medical doctor for any questions.      . metFORMIN (GLUCOPHAGE) 500 MG tablet Take 500 mg by mouth 2 (two) times daily with a meal.       . Multiple Vitamin (MULITIVITAMIN WITH MINERALS) TABS Take 1 tablet by mouth daily.      Marland Kitchen PARoxetine (PAXIL) 20 MG tablet Take 20 mg by mouth every morning.       . simvastatin (ZOCOR) 20 MG tablet Take 1 tablet (20 mg total) by mouth at bedtime.  90 tablet  1  . traMADol (ULTRAM) 50 MG tablet Take 50 mg by mouth every 6 (six) hours as needed. For pain        Past Medical History  Diagnosis Date  . Coronary atherosclerosis of native coronary artery     BMS circ  2001, DES LAD 2005, DES LAD and RCA 2008, subsequent CABG  . Drug allergy     Allergy to Plavix (hives) - took Ticlid in past  . Essential hypertension, benign   . Mixed hyperlipidemia   . Type 2 diabetes mellitus   . Myocardial infarction ~ 2002  . Arthritis     rheumatoid  . Depression   . Peripheral neuropathy   . Peptic ulcer disease ~ 1975  . Nephrolithiasis 02/07/2011  . Angina   . Breast cancer     Chemo and mastectomy- right  . Diabetes mellitus   . Chronic systolic heart failure     LVEF 40-45%  . Anxiety   . Sternal wound infection     Past Surgical History  Procedure Date  . Mastectomy 1988    right  . Cholecystectomy 1969  . Abdominal hysterectomy 1989  . Tonsillectomy and adenoidectomy 1979  . Appendectomy 1969  . Tubal ligation 1972  . Coronary artery bypass graft 01/11/2011    Procedure: CORONARY ARTERY BYPASS GRAFTING (CABG);  Surgeon: Kathlee Nations Suann Larry, MD;  Location: [facility] OR;  Service: Open Heart Surgery;  Laterality: N/A;  cabg x four, using left internal mammary artery and  right leg greater saphenous vein harvestede endoscopically  . Sternal wound debridement 03/01/2011    Procedure: STERNAL WOUND DEBRIDEMENT;  Surgeon: Kathlee Nations Suann Larry, MD;  Location: [facility] OR;  Service: Open Heart Surgery;  Laterality: N/A;  . Application of wound vac 03/01/2011    Procedure: APPLICATION OF WOUND VAC;  Surgeon: Kathlee Nations Suann Larry, MD;  Location: [facility] OR;  Service: Open Heart Surgery;  Laterality: N/A;  . Breast surgery   . Sternal wires removal 04/02/2011    Procedure: STERNAL WIRES REMOVAL;  Surgeon: Kathlee Nations Suann Larry, MD;  Location: [facility] OR;  Service: Open Heart Surgery;  Laterality: N/A;    Family History  Problem Relation Age of Onset  . Anesthesia problems Neg Hx     Social History Ms. Masin reports that she quit smoking about 4 years ago. Her smoking use included Cigarettes. She has a 20 pack-year smoking history. She has never used smokeless tobacco. Ms. Doster  reports that she does not drink alcohol.  Review of Systems No palpitations or syncope. Otherwise negative except as outlined.  Physical Examination Filed Vitals:   05/21/11 1313  BP: 133/76  Pulse: 92  Resp: 16   Normally nourished appearing in NAD.  HEENT: Conjunctiva and lids normal, oropharynx clear with moist mucosa.  Neck: Supple, no elevated JVP or carotid bruits, no thyromegaly.  Lungs: Clear to auscultation, diminished at bases, nonlabored breathing at rest.  Cardiac: Regular rate and rhythm, no S3 or pericardial rub.  Thorax: Dressing to mid sternum intact.  Abdomen: Nontender, bowel sounds present, no guarding or rebound.  Extremities: No pitting edema, distal pulses1+. Skin: Warm and dry.  Musculoskeletal: No kyphosis.  Neuropsychiatric: Alert and oriented x3, affect grossly appropriate.    Problem List and Plan

## 2011-05-21 NOTE — Assessment & Plan Note (Signed)
Symptomatically stable on medical therapy. Refilled Coreg - no change to dose as yet.

## 2011-05-21 NOTE — Assessment & Plan Note (Signed)
Blood pressure reasonable.

## 2011-05-23 ENCOUNTER — Ambulatory Visit (INDEPENDENT_AMBULATORY_CARE_PROVIDER_SITE_OTHER): Payer: Self-pay | Admitting: Cardiothoracic Surgery

## 2011-05-23 ENCOUNTER — Encounter: Payer: Self-pay | Admitting: Cardiothoracic Surgery

## 2011-05-23 VITALS — BP 127/72 | HR 86 | Resp 16 | Ht 65.0 in | Wt 162.0 lb

## 2011-05-23 DIAGNOSIS — IMO0002 Reserved for concepts with insufficient information to code with codable children: Secondary | ICD-10-CM

## 2011-05-23 DIAGNOSIS — T07XXXA Unspecified multiple injuries, initial encounter: Secondary | ICD-10-CM

## 2011-05-23 DIAGNOSIS — I2510 Atherosclerotic heart disease of native coronary artery without angina pectoris: Secondary | ICD-10-CM

## 2011-05-23 DIAGNOSIS — Z09 Encounter for follow-up examination after completed treatment for conditions other than malignant neoplasm: Secondary | ICD-10-CM

## 2011-05-23 DIAGNOSIS — E119 Type 2 diabetes mellitus without complications: Secondary | ICD-10-CM

## 2011-05-23 NOTE — Progress Notes (Signed)
PCP is Leone Payor, MD, [place]  [id number] No chief complaint on file.   HPI: The patient returns for sternal wound check. She had multivessel bypass grafting November 2012. She developed a visual sternal wound infection and required sternal debridement and wound VAC therapy in the summer 2012. She had Serratia marcescens growth in the wound treated with appropriate antibiotics. Approximately 4 weeks ago the wound VAC was transition to daily wet-to-dry dressing changes as the wound has healed and contracted in nicely except for a small area at the lower third with a small amount of exposed sternal bone. This area has failed to fill in with clean granulation tissue and a prolonged course of oral Cipro has been started for recent culture of recurrent Serratia.  The patient is changing the wound daily. Today there is some unhealthy granulation tissue over the exposed bone which is cleaned away. There is no deep sinus tract. 90% of the incision is now healed.   Past Medical History  Diagnosis Date  . Coronary atherosclerosis of native coronary artery     BMS circ 2001, DES LAD 2005, DES LAD and RCA 2008, subsequent CABG  . Drug allergy     Allergy to Plavix (hives) - took Ticlid in past  . Essential hypertension, benign   . Mixed hyperlipidemia   . Type 2 diabetes mellitus   . Myocardial infarction ~ 2002  . Arthritis     rheumatoid  . Depression   . Peripheral neuropathy   . Peptic ulcer disease ~ 1975  . Nephrolithiasis 02/07/2011  . Angina   . Breast cancer     Chemo and mastectomy- right  . Diabetes mellitus   . Chronic systolic heart failure     LVEF 40-45%  . Anxiety   . Sternal wound infection     Past Surgical History  Procedure Date  . Mastectomy 1988    right  . Cholecystectomy 1969  . Abdominal hysterectomy 1989  . Tonsillectomy and adenoidectomy 1979  . Appendectomy 1969  . Tubal ligation 1972  . Coronary artery bypass graft  01/11/2011    Procedure: CORONARY ARTERY BYPASS GRAFTING (CABG);  Surgeon: Kathlee Nations Suann Larry, MD;  Location: [facility] OR;  Service: Open Heart Surgery;  Laterality: N/A;  cabg x four, using left internal mammary artery and right leg greater saphenous vein harvestede endoscopically  . Sternal wound debridement 03/01/2011    Procedure: STERNAL WOUND DEBRIDEMENT;  Surgeon: Kathlee Nations Suann Larry, MD;  Location: [facility] OR;  Service: Open Heart Surgery;  Laterality: N/A;  . Application of wound vac 03/01/2011    Procedure: APPLICATION OF WOUND VAC;  Surgeon: Kathlee Nations Suann Larry, MD;  Location: [facility] OR;  Service: Open Heart Surgery;  Laterality: N/A;  . Breast surgery   . Sternal wires removal 04/02/2011    Procedure: STERNAL WIRES REMOVAL;  Surgeon: Kathlee Nations Suann Larry, MD;  Location: [facility] OR;  Service: Open Heart Surgery;  Laterality: N/A;    Family History  Problem Relation Age of Onset  . Anesthesia problems Neg Hx     Social History History  Substance Use Topics  . Smoking status: Former Smoker -- 1.0 packs/day for 20 years    Types: Cigarettes    Quit date: 02/27/2007  . Smokeless tobacco: Never Used  . Alcohol Use: No    Current Outpatient Prescriptions  Medication Sig Dispense Refill  . aspirin 325 MG tablet Take 325 mg by mouth daily.       Marland Kitchen  carvedilol (COREG) 3.125 MG tablet Take 1 tablet (3.125 mg total) by mouth 2 (two) times daily.  180 tablet  3  . ciprofloxacin (CIPRO) 250 MG tablet Take 250 mg by mouth 2 (two) times daily.      Marland Kitchen gabapentin (NEURONTIN) 300 MG capsule Take 300 mg by mouth 3 (three) times daily.       . insulin glargine (LANTUS) 100 UNIT/ML injection Inject 40 Units into the skin 2 (two) times daily. Patient to gradually increase her insulin glargine to pre op dose of 100 units [place] two times daily.  Contact medical doctor for any questions.      . insulin lispro (HUMALOG) 100 UNIT/ML injection Inject 20 Units into the skin 3 (three) times daily before meals. Patient to gradually  increase insulin lispro to the pre op dose of 84 units [place] three times daily.  Contact medical doctor for any questions.      . metFORMIN (GLUCOPHAGE) 500 MG tablet Take 500 mg by mouth 2 (two) times daily with a meal.       . Multiple Vitamin (MULITIVITAMIN WITH MINERALS) TABS Take 1 tablet by mouth daily.      Marland Kitchen PARoxetine (PAXIL) 20 MG tablet Take 20 mg by mouth every morning.       . simvastatin (ZOCOR) 20 MG tablet Take 1 tablet (20 mg total) by mouth at bedtime.  90 tablet  1  . traMADol (ULTRAM) 50 MG tablet Take 50 mg by mouth every 6 (six) hours as needed. For pain        Allergies  Allergen Reactions  . Clopidogrel Bisulfate Hives  . Codeine Nausea And Vomiting  . Morphine Nausea And Vomiting    Review of Systems no fever no elevated blood sugar no drainage no bleeding from the wound BP 127/72  Pulse 86  Resp 16  Ht 5\' 5"  (1.651 m)  Wt 162 lb (73.483 kg)  BMI 26.96 kg/m2  SpO2 96%  LMP 01/05/1996 Physical Exam Breath sounds are clear Cardiac rhythm is regular without murmur Sternal incision has an area of lower third mucoid granulation tissue and a small 3-4 mm area of exposed sternal bone A clean wet to dry dressing is applied.  Diagnostic Tests:  None Impression: Continue topical wet-to-dry dressing changes and oral Cipro  Plan: We'll aspirate consultation by the cone wound therapy for possible hyperbaric oxygen to facilitate complete closure of the sternal incision

## 2011-06-06 ENCOUNTER — Encounter: Payer: Self-pay | Admitting: Cardiothoracic Surgery

## 2011-06-06 ENCOUNTER — Ambulatory Visit (INDEPENDENT_AMBULATORY_CARE_PROVIDER_SITE_OTHER): Payer: 59 | Admitting: Cardiothoracic Surgery

## 2011-06-06 VITALS — BP 122/68 | HR 88 | Resp 18 | Ht 65.0 in | Wt 165.0 lb

## 2011-06-06 DIAGNOSIS — T07XXXA Unspecified multiple injuries, initial encounter: Secondary | ICD-10-CM

## 2011-06-06 DIAGNOSIS — Z09 Encounter for follow-up examination after completed treatment for conditions other than malignant neoplasm: Secondary | ICD-10-CM

## 2011-06-06 DIAGNOSIS — I2510 Atherosclerotic heart disease of native coronary artery without angina pectoris: Secondary | ICD-10-CM

## 2011-06-06 NOTE — Progress Notes (Signed)
PCP is Leone Payor, MD, MD Referring Provider is Leone Payor, MD  Chief Complaint  Patient presents with  . Follow-up    10 day f/u wound check    HPI: The patient returns for wound check of a now small area nonhealing sternal incision following CABG last fall. There is a small amount of sternal bone exposed proximally 3 mm. This is now clean. She is been on oral Cipro for culture positive Serratia. There is minimal drainage.   Past Medical History  Diagnosis Date  . Coronary atherosclerosis of native coronary artery     BMS circ 2001, DES LAD 2005, DES LAD and RCA 2008, subsequent CABG  . Drug allergy     Allergy to Plavix (hives) - took Ticlid in past  . Essential hypertension, benign   . Mixed hyperlipidemia   . Type 2 diabetes mellitus   . Myocardial infarction ~ 2002  . Arthritis     rheumatoid  . Depression   . Peripheral neuropathy   . Peptic ulcer disease ~ 1975  . Nephrolithiasis 02/07/2011  . Angina   . Breast cancer     Chemo and mastectomy- right  . Diabetes mellitus   . Chronic systolic heart failure     LVEF 40-45%  . Anxiety   . Sternal wound infection     Past Surgical History  Procedure Date  . Mastectomy 1988    right  . Cholecystectomy 1969  . Abdominal hysterectomy 1989  . Tonsillectomy and adenoidectomy 1979  . Appendectomy 1969  . Tubal ligation 1972  . Coronary artery bypass graft 01/11/2011    Procedure: CORONARY ARTERY BYPASS GRAFTING (CABG);  Surgeon: Kathlee Nations Suann Larry, MD;  Location: [facility] OR;  Service: Open Heart Surgery;  Laterality: N/A;  cabg x four, using left internal mammary artery and right leg greater saphenous vein harvestede endoscopically  . Sternal wound debridement 03/01/2011    Procedure: STERNAL WOUND DEBRIDEMENT;  Surgeon: Kathlee Nations Suann Larry, MD;  Location: [facility] OR;  Service: Open Heart Surgery;  Laterality: N/A;  . Application of wound vac 03/01/2011    Procedure: APPLICATION OF WOUND VAC;  Surgeon: Kathlee Nations Suann Larry, MD;  Location: [facility] OR;  Service: Open Heart Surgery;  Laterality: N/A;  . Breast surgery   . Sternal wires removal 04/02/2011    Procedure: STERNAL WIRES REMOVAL;  Surgeon: Kathlee Nations Suann Larry, MD;  Location: [facility] OR;  Service: Open Heart Surgery;  Laterality: N/A;    Family History  Problem Relation Age of Onset  . Anesthesia problems Neg Hx     Social History History  Substance Use Topics  . Smoking status: Former Smoker -- 1.0 packs/day for 20 years    Types: Cigarettes    Quit date: 02/27/2007  . Smokeless tobacco: Never Used  . Alcohol Use: No    Current Outpatient Prescriptions  Medication Sig Dispense Refill  . aspirin 325 MG tablet Take 325 mg by mouth daily.       . carvedilol (COREG) 3.125 MG tablet Take 1 tablet (3.125 mg total) by mouth 2 (two) times daily.  180 tablet  3  . gabapentin (NEURONTIN) 300 MG capsule Take 300 mg by mouth 3 (three) times daily.       . insulin glargine (LANTUS) 100 UNIT/ML injection Inject 40 Units into the skin 2 (two) times daily. Patient to gradually increase her insulin glargine to pre op dose of 100 units [place] two times daily.  Contact medical doctor  for any questions.      . insulin lispro (HUMALOG) 100 UNIT/ML injection Inject 20 Units into the skin 3 (three) times daily before meals. Patient to gradually increase insulin lispro to the pre op dose of 84 units  three times daily.  Contact medical doctor for any questions.      . metFORMIN (GLUCOPHAGE) 500 MG tablet Take 500 mg by mouth 2 (two) times daily with a meal.       . Multiple Vitamin (MULITIVITAMIN WITH MINERALS) TABS Take 1 tablet by mouth daily.      Marland Kitchen PARoxetine (PAXIL) 20 MG tablet Take 20 mg by mouth every morning.       . simvastatin (ZOCOR) 20 MG tablet Take 1 tablet (20 mg total) by mouth at bedtime.  90 tablet  1  . traMADol (ULTRAM) 50 MG tablet Take 50 mg by mouth every 6 (six) hours as needed. For pain        Allergies  Allergen Reactions  . Clopidogrel Bisulfate  Hives  . Codeine Nausea And Vomiting  . Morphine Nausea And Vomiting    Review of Systems no fever good appetite good exercise tolerance no angina BP 122/68  Pulse 88  Resp 18  Ht 5\' 5"  (1.651 m)  Wt 165 lb (74.844 kg)  BMI 27.46 kg/m2  SpO2 95%  LMP 01/05/1996 Physical Exam Lungs clear Cardiac rhythm regular Sternum well-healed except for very small 3-4 mm area of exposed bone no surrounding cellulitis no drainage  Diagnostic Tests: None  Impression: Slowly healing sternal incision with small area of exposed bone. The patient we evaluated by the hyperbaric wound center for possible therapy.  Plan: Continue current local care until evaluated by wound center

## 2011-06-18 ENCOUNTER — Ambulatory Visit: Payer: 59

## 2011-06-18 ENCOUNTER — Encounter: Payer: Self-pay | Admitting: Endocrinology

## 2011-06-18 ENCOUNTER — Other Ambulatory Visit (INDEPENDENT_AMBULATORY_CARE_PROVIDER_SITE_OTHER): Payer: 59

## 2011-06-18 ENCOUNTER — Telehealth: Payer: Self-pay | Admitting: *Deleted

## 2011-06-18 ENCOUNTER — Ambulatory Visit (INDEPENDENT_AMBULATORY_CARE_PROVIDER_SITE_OTHER): Payer: 59 | Admitting: Endocrinology

## 2011-06-18 VITALS — BP 118/62 | HR 79 | Temp 98.2°F | Ht 65.0 in | Wt 168.1 lb

## 2011-06-18 DIAGNOSIS — E1149 Type 2 diabetes mellitus with other diabetic neurological complication: Secondary | ICD-10-CM

## 2011-06-18 DIAGNOSIS — E1142 Type 2 diabetes mellitus with diabetic polyneuropathy: Secondary | ICD-10-CM

## 2011-06-18 DIAGNOSIS — G909 Disorder of the autonomic nervous system, unspecified: Secondary | ICD-10-CM

## 2011-06-18 LAB — HEMOGLOBIN A1C: Hgb A1c MFr Bld: 7.2 % — ABNORMAL HIGH (ref 4.6–6.5)

## 2011-06-18 NOTE — Telephone Encounter (Signed)
Called pt to inform of A1c results, pt informed (letter also mailed to pt). 

## 2011-06-18 NOTE — Progress Notes (Signed)
Subjective:    Patient ID: [name], female    DOB: Jul 14, 1950, 61 y.o.   MRN: [id number]  HPI pt states 15 years h/o dm, complicated by CAD, peripheral sensory neuropathy, and PAD.  she has been on insulin since soon after dx.  pt says his diet and exercise are not good.   Activity is limited by med probs.   She has a few mos of slight pain at her mid-sternum, and assoc doe.  Pt says her cbg's are low approx a few times per week, usually before the first meal of the day.  Past Medical History  Diagnosis Date  . Coronary atherosclerosis of native coronary artery     BMS circ 2001, DES LAD 2005, DES LAD and RCA 2008, subsequent CABG  . Drug allergy     Allergy to Plavix (hives) - took Ticlid in past  . Essential hypertension, benign   . Mixed hyperlipidemia   . Type 2 diabetes mellitus   . Myocardial infarction ~ 2002  . Arthritis     rheumatoid  . Depression   . Peripheral neuropathy   . Peptic ulcer disease ~ 1975  . Nephrolithiasis 02/07/2011  . Angina   . Breast cancer     Chemo and mastectomy- right  . Diabetes mellitus   . Chronic systolic heart failure     LVEF 40-45%  . Anxiety   . Sternal wound infection     Past Surgical History  Procedure Date  . Mastectomy 1988    right  . Cholecystectomy 1969  . Abdominal hysterectomy 1989  . Tonsillectomy and adenoidectomy 1979  . Appendectomy 1969  . Tubal ligation 1972  . Coronary artery bypass graft 01/11/2011    Procedure: CORONARY ARTERY BYPASS GRAFTING (CABG);  Surgeon: Kathlee Nations Suann Larry, MD;  Location: [facility] OR;  Service: Open Heart Surgery;  Laterality: N/A;  cabg x four, using left internal mammary artery and right leg greater saphenous vein harvestede endoscopically  . Sternal wound debridement 03/01/2011    Procedure: STERNAL WOUND DEBRIDEMENT;  Surgeon: Kathlee Nations Suann Larry, MD;  Location: [facility] OR;  Service: Open Heart Surgery;  Laterality: N/A;  . Application of wound vac 03/01/2011    Procedure: APPLICATION  OF WOUND VAC;  Surgeon: Kathlee Nations Suann Larry, MD;  Location: [facility] OR;  Service: Open Heart Surgery;  Laterality: N/A;  . Breast surgery   . Sternal wires removal 04/02/2011    Procedure: STERNAL WIRES REMOVAL;  Surgeon: Kathlee Nations Suann Larry, MD;  Location: [facility] OR;  Service: Open Heart Surgery;  Laterality: N/A;    History   Social History  . Marital Status: Married    Spouse Name: N/A    Number of Children: N/A  . Years of Education: N/A   Occupational History  . Not on file.   Social History Main Topics  . Smoking status: Former Smoker -- 1.0 packs/day for 20 years    Types: Cigarettes    Quit date: 02/27/2007  . Smokeless tobacco: Never Used  . Alcohol Use: No  . Drug Use: No  . Sexually Active: Yes    Birth Control/ Protection: Post-menopausal   Other Topics Concern  . Not on file   Social History Narrative  . No narrative on file    Current Outpatient Prescriptions on File Prior to Visit  Medication Sig Dispense Refill  . aspirin 325 MG tablet Take 325 mg by mouth daily.       . carvedilol (COREG) 3.125  MG tablet Take 1 tablet (3.125 mg total) by mouth 2 (two) times daily.  180 tablet  3  . gabapentin (NEURONTIN) 300 MG capsule Take 300 mg by mouth 3 (three) times daily.       . insulin glargine (LANTUS) 100 UNIT/ML injection Inject 35 Units into the skin 2 (two) times daily.       . insulin lispro (HUMALOG) 100 UNIT/ML injection Inject 20 Units into the skin 3 (three) times daily before meals.       . Multiple Vitamin (MULITIVITAMIN WITH MINERALS) TABS Take 1 tablet by mouth daily.      Marland Kitchen PARoxetine (PAXIL) 20 MG tablet Take 20 mg by mouth every morning.       . simvastatin (ZOCOR) 20 MG tablet Take 1 tablet (20 mg total) by mouth at bedtime.  90 tablet  1  . traMADol (ULTRAM) 50 MG tablet Take 50 mg by mouth every 6 (six) hours as needed. For pain        Allergies  Allergen Reactions  . Clopidogrel Bisulfate Hives  . Codeine Nausea And Vomiting  . Morphine Nausea And  Vomiting    Family History  Problem Relation Age of Onset  . Anesthesia problems Neg Hx     BP 118/62  Pulse 79  Temp(Src) 98.2 F (36.8 C) (Oral)  Ht 5\' 5"  (1.651 m)  Wt 168 lb 2 oz (76.261 kg)  BMI 27.98 kg/m2  SpO2 94%  LMP 01/05/1996  Review of Systems denies blurry vision, headache, palpitations, sob, n/v, urinary frequency, cramps, excessive diaphoresis, memory loss, menopausal sxs, bruising.  She lost weight since her cabg.  She has depression and rhinorrhea.      Objective:   Physical Exam VS: see vs page GEN: no distress HEAD: head: no deformity eyes: no periorbital swelling, no proptosis external nose and ears are normal mouth: no lesion seen NECK: supple, thyroid is not enlarged CHEST WALL: no deformity.  There is a healing sternotomy wound. LUNGS:  Clear to auscultation CV: reg rate and rhythm, no murmur ABD: abdomen is soft, nontender.  no hepatosplenomegaly.  not distended.  no hernia MUSCULOSKELETAL: muscle bulk and strength are grossly normal.  no obvious joint swelling.  gait is normal and steady EXTEMITIES: no deformity.  no ulcer on the feet.  feet are of normal color and temp.  no edema PULSES: dorsalis pedis intact bilat.  no carotid bruit NEURO:  cn 2-12 grossly intact.   readily moves all 4's.  sensation is intact to touch on the feet, but slightly decreased from noemal SKIN:  Normal texture and temperature.  No rash or suspicious lesion is visible.   NODES:  None palpable at the neck. PSYCH: alert, oriented x3.  Does not appear anxious nor depressed.  Lab Results  Component Value Date   HGBA1C 6.2* 02/28/2011      Assessment & Plan:  DM, overcontrolled. CAD.  There is high risk from hypoglycemia in this clinical situation. Weight loss.  This helps the rx of DM Atrial fibrillation.  This could also be exac by hypoglycemia

## 2011-06-18 NOTE — Patient Instructions (Addendum)
good diet and exercise habits significanly improve the control of your diabetes.  please let me know if you wish to be referred to a dietician.  high blood sugar is very risky to your health.  you should see an eye doctor every year. controlling your blood pressure and cholesterol drastically reduces the damage diabetes does to your body.  this also applies to quitting smoking.  please discuss these with your doctor.  you should take an aspirin every day, unless you have been advised by a doctor not to. check your blood sugar 2 times a day.  vary the time of day when you check, between before the 3 meals, and at bedtime.  also check if you have symptoms of your blood sugar being too high or too low.  please keep a record of the readings and bring it to your next appointment here.  please call us sooner if your blood sugar goes below 80, or if it stays over 200. blood tests are being requested for you today.  You will receive a letter with results. For now, stop metformin, and: Reduce the lantus to 35 units, 2x a day.  Please come back for a follow-up appointment in 3 months.

## 2011-06-27 NOTE — Progress Notes (Signed)
Vital signs only. No encounter.

## 2011-06-29 ENCOUNTER — Ambulatory Visit (HOSPITAL_COMMUNITY)
Admission: RE | Admit: 2011-06-29 | Discharge: 2011-06-29 | Disposition: A | Discharge status: Home / Self Care | Payer: 59 | Source: Ambulatory Visit | Attending: General Surgery | Admitting: General Surgery

## 2011-06-29 ENCOUNTER — Other Ambulatory Visit (HOSPITAL_BASED_OUTPATIENT_CLINIC_OR_DEPARTMENT_OTHER): Payer: Self-pay | Admitting: General Surgery

## 2011-06-29 ENCOUNTER — Encounter (HOSPITAL_BASED_OUTPATIENT_CLINIC_OR_DEPARTMENT_OTHER): Payer: 59 | Attending: General Surgery

## 2011-06-29 DIAGNOSIS — J90 Pleural effusion, not elsewhere classified: Secondary | ICD-10-CM | POA: Insufficient documentation

## 2011-06-29 DIAGNOSIS — Y832 Surgical operation with anastomosis, bypass or graft as the cause of abnormal reaction of the patient, or of later complication, without mention of misadventure at the time of the procedure: Secondary | ICD-10-CM | POA: Insufficient documentation

## 2011-06-29 DIAGNOSIS — S21109A Unspecified open wound of unspecified front wall of thorax without penetration into thoracic cavity, initial encounter: Secondary | ICD-10-CM | POA: Insufficient documentation

## 2011-06-29 DIAGNOSIS — R5383 Other fatigue: Secondary | ICD-10-CM | POA: Insufficient documentation

## 2011-06-29 DIAGNOSIS — M869 Osteomyelitis, unspecified: Secondary | ICD-10-CM | POA: Insufficient documentation

## 2011-06-29 DIAGNOSIS — R0602 Shortness of breath: Secondary | ICD-10-CM | POA: Insufficient documentation

## 2011-06-29 DIAGNOSIS — Z48812 Encounter for surgical aftercare following surgery on the circulatory system: Secondary | ICD-10-CM | POA: Insufficient documentation

## 2011-06-29 DIAGNOSIS — Z794 Long term (current) use of insulin: Secondary | ICD-10-CM | POA: Insufficient documentation

## 2011-06-29 DIAGNOSIS — R000 Tachycardia, unspecified: Secondary | ICD-10-CM | POA: Insufficient documentation

## 2011-06-29 DIAGNOSIS — E119 Type 2 diabetes mellitus without complications: Secondary | ICD-10-CM | POA: Insufficient documentation

## 2011-06-29 DIAGNOSIS — R5381 Other malaise: Secondary | ICD-10-CM | POA: Insufficient documentation

## 2011-06-29 NOTE — Progress Notes (Signed)
[facility]  [name], [name]              ACCOUNT NO.:  [id number]  MEDICAL RECORD NO.:  [id number]      DATE OF BIRTH:  03-Mar-1950  PHYSICIAN:  Ardath Sax, M.D.           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  This all started when she had a quadruple bypass and developed an infected wound. 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  She has already had the wires removed by the cardiac surgeon.  I x-rayed her today, and it shows an osteo in the sternum right where she has a 4-mm ulcer.  This has been treated with antibiotics.  She has been treated with debridement, removal of the wires, and she has failed all the ordinary methods for standard wound care and healing this wound.  I feel we are going to have to treat this wound locally with debridements and to begin with silver alginate and later perhaps of collagen.  I am going to culture today and start her on doxycycline as I am sure this is probably an MRSA.  I am going to apply for her to receive hyperbaric oxygen treatments as she has failed ordinary methods. She is on many medicines including Lantus, insulin for her diabetes.  She also uses Humalog, metformin.  She is on carvedilol 3.125 for hypertension.  She takes simvastatin and vitamins.  She was afebrile today, and her vital signs were all normal.  Her blood pressure is 130/80.  She has had many surgeries before including a right mastectomy 20 years ago.  She has had a cholecystectomy, a hysterectomy and of course her quadruple bypass, which was about 6 months ago, so she is now on doxycycline added to her other medicines.  We are packing this wound with silver alginate, and started her on doxycycline and applying for HBO.     Ardath Sax, M.D.     PP/MEDQ  D:  06/29/2011  T:   06/29/2011  Job:  [id number]

## 2011-07-24 ENCOUNTER — Encounter (HOSPITAL_COMMUNITY): Payer: Self-pay

## 2011-07-24 ENCOUNTER — Emergency Department (HOSPITAL_COMMUNITY): Payer: 59

## 2011-07-24 ENCOUNTER — Inpatient Hospital Stay (HOSPITAL_COMMUNITY)
Admission: EM | Admit: 2011-07-24 | Discharge: 2011-08-03 | DRG: 280 | Disposition: A | Discharge status: Home Health | Payer: 59 | Attending: Cardiology | Admitting: Cardiology

## 2011-07-24 DIAGNOSIS — H532 Diplopia: Secondary | ICD-10-CM | POA: Diagnosis not present

## 2011-07-24 DIAGNOSIS — E785 Hyperlipidemia, unspecified: Secondary | ICD-10-CM | POA: Diagnosis present

## 2011-07-24 DIAGNOSIS — J984 Other disorders of lung: Secondary | ICD-10-CM

## 2011-07-24 DIAGNOSIS — Z87891 Personal history of nicotine dependence: Secondary | ICD-10-CM

## 2011-07-24 DIAGNOSIS — Z7982 Long term (current) use of aspirin: Secondary | ICD-10-CM

## 2011-07-24 DIAGNOSIS — E1169 Type 2 diabetes mellitus with other specified complication: Secondary | ICD-10-CM | POA: Diagnosis not present

## 2011-07-24 DIAGNOSIS — L02219 Cutaneous abscess of trunk, unspecified: Secondary | ICD-10-CM

## 2011-07-24 DIAGNOSIS — I639 Cerebral infarction, unspecified: Secondary | ICD-10-CM

## 2011-07-24 DIAGNOSIS — R29810 Facial weakness: Secondary | ICD-10-CM | POA: Diagnosis not present

## 2011-07-24 DIAGNOSIS — I255 Ischemic cardiomyopathy: Secondary | ICD-10-CM

## 2011-07-24 DIAGNOSIS — I658 Occlusion and stenosis of other precerebral arteries: Secondary | ICD-10-CM | POA: Diagnosis present

## 2011-07-24 DIAGNOSIS — I10 Essential (primary) hypertension: Secondary | ICD-10-CM | POA: Diagnosis present

## 2011-07-24 DIAGNOSIS — E1142 Type 2 diabetes mellitus with diabetic polyneuropathy: Secondary | ICD-10-CM | POA: Diagnosis present

## 2011-07-24 DIAGNOSIS — I4729 Other ventricular tachycardia: Secondary | ICD-10-CM | POA: Diagnosis not present

## 2011-07-24 DIAGNOSIS — J9600 Acute respiratory failure, unspecified whether with hypoxia or hypercapnia: Secondary | ICD-10-CM | POA: Diagnosis present

## 2011-07-24 DIAGNOSIS — L03319 Cellulitis of trunk, unspecified: Secondary | ICD-10-CM

## 2011-07-24 DIAGNOSIS — E1149 Type 2 diabetes mellitus with other diabetic neurological complication: Secondary | ICD-10-CM | POA: Diagnosis present

## 2011-07-24 DIAGNOSIS — I6529 Occlusion and stenosis of unspecified carotid artery: Secondary | ICD-10-CM

## 2011-07-24 DIAGNOSIS — I2510 Atherosclerotic heart disease of native coronary artery without angina pectoris: Secondary | ICD-10-CM

## 2011-07-24 DIAGNOSIS — D649 Anemia, unspecified: Secondary | ICD-10-CM | POA: Diagnosis not present

## 2011-07-24 DIAGNOSIS — G8190 Hemiplegia, unspecified affecting unspecified side: Secondary | ICD-10-CM

## 2011-07-24 DIAGNOSIS — Z9861 Coronary angioplasty status: Secondary | ICD-10-CM

## 2011-07-24 DIAGNOSIS — E875 Hyperkalemia: Secondary | ICD-10-CM | POA: Diagnosis present

## 2011-07-24 DIAGNOSIS — I509 Heart failure, unspecified: Secondary | ICD-10-CM | POA: Diagnosis present

## 2011-07-24 DIAGNOSIS — Z794 Long term (current) use of insulin: Secondary | ICD-10-CM

## 2011-07-24 DIAGNOSIS — N390 Urinary tract infection, site not specified: Secondary | ICD-10-CM | POA: Diagnosis not present

## 2011-07-24 DIAGNOSIS — I472 Ventricular tachycardia, unspecified: Secondary | ICD-10-CM | POA: Diagnosis not present

## 2011-07-24 DIAGNOSIS — R5381 Other malaise: Secondary | ICD-10-CM | POA: Diagnosis not present

## 2011-07-24 DIAGNOSIS — F3289 Other specified depressive episodes: Secondary | ICD-10-CM | POA: Diagnosis present

## 2011-07-24 DIAGNOSIS — B961 Klebsiella pneumoniae [K. pneumoniae] as the cause of diseases classified elsewhere: Secondary | ICD-10-CM | POA: Diagnosis not present

## 2011-07-24 DIAGNOSIS — I25810 Atherosclerosis of coronary artery bypass graft(s) without angina pectoris: Secondary | ICD-10-CM | POA: Diagnosis present

## 2011-07-24 DIAGNOSIS — R471 Dysarthria and anarthria: Secondary | ICD-10-CM | POA: Diagnosis not present

## 2011-07-24 DIAGNOSIS — R4182 Altered mental status, unspecified: Secondary | ICD-10-CM | POA: Diagnosis present

## 2011-07-24 DIAGNOSIS — Z79899 Other long term (current) drug therapy: Secondary | ICD-10-CM

## 2011-07-24 DIAGNOSIS — I5023 Acute on chronic systolic (congestive) heart failure: Secondary | ICD-10-CM

## 2011-07-24 DIAGNOSIS — Z951 Presence of aortocoronary bypass graft: Secondary | ICD-10-CM

## 2011-07-24 DIAGNOSIS — F411 Generalized anxiety disorder: Secondary | ICD-10-CM | POA: Diagnosis present

## 2011-07-24 DIAGNOSIS — F329 Major depressive disorder, single episode, unspecified: Secondary | ICD-10-CM | POA: Diagnosis present

## 2011-07-24 DIAGNOSIS — I6350 Cerebral infarction due to unspecified occlusion or stenosis of unspecified cerebral artery: Secondary | ICD-10-CM | POA: Diagnosis not present

## 2011-07-24 DIAGNOSIS — M869 Osteomyelitis, unspecified: Secondary | ICD-10-CM | POA: Diagnosis present

## 2011-07-24 DIAGNOSIS — Z853 Personal history of malignant neoplasm of breast: Secondary | ICD-10-CM

## 2011-07-24 DIAGNOSIS — E782 Mixed hyperlipidemia: Secondary | ICD-10-CM | POA: Diagnosis present

## 2011-07-24 DIAGNOSIS — R319 Hematuria, unspecified: Secondary | ICD-10-CM | POA: Diagnosis present

## 2011-07-24 DIAGNOSIS — R0902 Hypoxemia: Secondary | ICD-10-CM

## 2011-07-24 DIAGNOSIS — M069 Rheumatoid arthritis, unspecified: Secondary | ICD-10-CM | POA: Diagnosis present

## 2011-07-24 DIAGNOSIS — I2589 Other forms of chronic ischemic heart disease: Secondary | ICD-10-CM | POA: Diagnosis present

## 2011-07-24 DIAGNOSIS — I214 Non-ST elevation (NSTEMI) myocardial infarction: Principal | ICD-10-CM

## 2011-07-24 DIAGNOSIS — J9690 Respiratory failure, unspecified, unspecified whether with hypoxia or hypercapnia: Secondary | ICD-10-CM

## 2011-07-24 DIAGNOSIS — I252 Old myocardial infarction: Secondary | ICD-10-CM

## 2011-07-24 LAB — URINALYSIS, ROUTINE W REFLEX MICROSCOPIC
Ketones, ur: NEGATIVE mg/dL
Protein, ur: NEGATIVE mg/dL
Urobilinogen, UA: 0.2 mg/dL (ref 0.0–1.0)

## 2011-07-24 LAB — URINE MICROSCOPIC-ADD ON

## 2011-07-24 LAB — CARDIAC PANEL(CRET KIN+CKTOT+MB+TROPI)
Relative Index: 6.1 — ABNORMAL HIGH (ref 0.0–2.5)
Troponin I: 2.25 ng/mL (ref <0.30)

## 2011-07-24 LAB — POCT I-STAT 3, ART BLOOD GAS (G3+)
Acid-base deficit: 5 mmol/L — ABNORMAL HIGH (ref 0.0–2.0)
Bicarbonate: 26.6 mEq/L — ABNORMAL HIGH (ref 20.0–24.0)
O2 Saturation: 96 %
TCO2: 29 mmol/L (ref 0–100)
pCO2 arterial: 89.2 mmHg (ref 35.0–45.0)
pH, Arterial: 7.082 — CL (ref 7.350–7.400)
pO2, Arterial: 123 mmHg — ABNORMAL HIGH (ref 80.0–100.0)

## 2011-07-24 LAB — POCT I-STAT, CHEM 8
BUN: 25 mg/dL — ABNORMAL HIGH (ref 6–23)
Calcium, Ion: 1.26 mmol/L (ref 1.12–1.32)
Chloride: 108 meq/L (ref 96–112)
Creatinine, Ser: 1.3 mg/dL — ABNORMAL HIGH (ref 0.50–1.10)
Glucose, Bld: 272 mg/dL — ABNORMAL HIGH (ref 70–99)
HCT: 40 % (ref 36.0–46.0)
Hemoglobin: 13.6 g/dL (ref 12.0–15.0)
Potassium: 6 mEq/L — ABNORMAL HIGH (ref 3.5–5.1)
Sodium: 140 mEq/L (ref 135–145)
TCO2: 27 mmol/L (ref 0–100)

## 2011-07-24 LAB — COMPREHENSIVE METABOLIC PANEL
ALT: 20 U/L (ref 0–35)
Alkaline Phosphatase: 102 U/L (ref 39–117)
BUN: 21 mg/dL (ref 6–23)
CO2: 24 mEq/L (ref 19–32)
Calcium: 9.2 mg/dL (ref 8.4–10.5)
GFR calc Af Amer: 51 mL/min — ABNORMAL LOW (ref >90)
GFR calc non Af Amer: 44 mL/min — ABNORMAL LOW (ref >90)
Glucose, Bld: 261 mg/dL — ABNORMAL HIGH (ref 70–99)
Sodium: 137 mEq/L (ref 135–145)

## 2011-07-24 LAB — POCT I-STAT TROPONIN I: Troponin i, poc: 1.02 ng/mL (ref 0.00–0.08)

## 2011-07-24 LAB — CBC
HCT: 36.7 % (ref 36.0–46.0)
Hemoglobin: 11.9 g/dL — ABNORMAL LOW (ref 12.0–15.0)
MCH: 27.7 pg (ref 26.0–34.0)
MCHC: 32.4 g/dL (ref 30.0–36.0)
MCV: 85.3 fL (ref 78.0–100.0)
RBC: 4.3 MIL/uL (ref 3.87–5.11)

## 2011-07-24 LAB — MRSA PCR SCREENING: MRSA by PCR: NEGATIVE

## 2011-07-24 LAB — RAPID URINE DRUG SCREEN, HOSP PERFORMED
Barbiturates: NOT DETECTED
Tetrahydrocannabinol: NOT DETECTED

## 2011-07-24 LAB — GLUCOSE, CAPILLARY: Glucose-Capillary: 262 mg/dL — ABNORMAL HIGH (ref 70–99)

## 2011-07-24 LAB — PROCALCITONIN: Procalcitonin: <0.1 ng/mL

## 2011-07-24 LAB — DIFFERENTIAL
Basophils Relative: 0 % (ref 0–1)
Eosinophils Relative: 2 % (ref 0–5)
Lymphs Abs: 7 10*3/uL — ABNORMAL HIGH (ref 0.7–4.0)
Monocytes Absolute: 1.2 10*3/uL — ABNORMAL HIGH (ref 0.1–1.0)
Neutro Abs: 8.9 10*3/uL — ABNORMAL HIGH (ref 1.7–7.7)
Neutrophils Relative %: 51 % (ref 43–77)

## 2011-07-24 LAB — POTASSIUM: Potassium: 6.4 mEq/L (ref 3.5–5.1)

## 2011-07-24 LAB — CK TOTAL AND CKMB (NOT AT ARMC)
Relative Index: 4.2 — ABNORMAL HIGH (ref 0.0–2.5)
Total CK: 143 U/L (ref 7–177)

## 2011-07-24 LAB — PATHOLOGIST SMEAR REVIEW

## 2011-07-24 MED ORDER — SODIUM CHLORIDE 0.9 % IJ SOLN
3.0000 mL | Freq: Two times a day (BID) | INTRAMUSCULAR | Status: DC
  Administered 2011-07-26: 22:00:00 via INTRAVENOUS
  Administered 2011-07-27 – 2011-08-03 (×15): 3 mL via INTRAVENOUS

## 2011-07-24 MED ORDER — TRAMADOL HCL 50 MG PO TABS
50.0000 mg | ORAL_TABLET | Freq: Four times a day (QID) | ORAL | Status: DC | PRN
  Filled 2011-07-24 (×2): qty 1

## 2011-07-24 MED ORDER — INSULIN GLARGINE 100 UNIT/ML ~~LOC~~ SOLN
35.0000 [IU] | Freq: Two times a day (BID) | SUBCUTANEOUS | Status: DC
  Administered 2011-07-24 – 2011-07-25 (×2): 35 [IU] via SUBCUTANEOUS

## 2011-07-24 MED ORDER — SODIUM CHLORIDE 0.9 % IV SOLN: 250.0000 mL | INTRAVENOUS | Status: DC | PRN

## 2011-07-24 MED ORDER — CLARITHROMYCIN 500 MG PO TABS
500.0000 mg | ORAL_TABLET | Freq: Two times a day (BID) | ORAL | Status: DC
  Administered 2011-07-24 – 2011-08-01 (×13): 500 mg via ORAL
  Filled 2011-07-24 (×18): qty 1

## 2011-07-24 MED ORDER — INSULIN ASPART 100 UNIT/ML ~~LOC~~ SOLN
20.0000 [IU] | Freq: Three times a day (TID) | SUBCUTANEOUS | Status: DC
  Administered 2011-07-25 (×3): 20 [IU] via SUBCUTANEOUS

## 2011-07-24 MED ORDER — HEPARIN BOLUS VIA INFUSION
4000.0000 [IU] | Freq: Once | INTRAVENOUS | Status: AC
  Administered 2011-07-24: 4000 [IU] via INTRAVENOUS
  Filled 2011-07-24: qty 4000

## 2011-07-24 MED ORDER — HEPARIN (PORCINE) IN NACL 100-0.45 UNIT/ML-% IJ SOLN
1300.0000 [IU]/h | INTRAMUSCULAR | Status: DC
  Administered 2011-07-24: 1000 [IU]/h via INTRAVENOUS
  Administered 2011-07-25 – 2011-07-26 (×2): 1300 [IU]/h via INTRAVENOUS
  Filled 2011-07-24 (×5): qty 250

## 2011-07-24 MED ORDER — ALBUTEROL SULFATE (5 MG/ML) 0.5% IN NEBU
5.0000 mg | INHALATION_SOLUTION | Freq: Once | RESPIRATORY_TRACT | Status: AC
  Administered 2011-07-24: 5 mg via RESPIRATORY_TRACT
  Filled 2011-07-24: qty 1

## 2011-07-24 MED ORDER — PAROXETINE HCL 20 MG PO TABS
20.0000 mg | ORAL_TABLET | Freq: Every morning | ORAL | Status: DC
  Administered 2011-07-25 – 2011-08-03 (×9): 20 mg via ORAL
  Filled 2011-07-24 (×10): qty 1

## 2011-07-24 MED ORDER — IPRATROPIUM BROMIDE 0.02 % IN SOLN
0.5000 mg | Freq: Once | RESPIRATORY_TRACT | Status: AC
  Administered 2011-07-24: 0.5 mg via RESPIRATORY_TRACT
  Filled 2011-07-24: qty 2.5

## 2011-07-24 MED ORDER — GABAPENTIN 300 MG PO CAPS
300.0000 mg | ORAL_CAPSULE | Freq: Three times a day (TID) | ORAL | Status: DC
  Administered 2011-07-24 – 2011-08-03 (×25): 300 mg via ORAL
  Filled 2011-07-24 (×31): qty 1

## 2011-07-24 MED ORDER — SODIUM CHLORIDE 0.9 % IJ SOLN: 3.0000 mL | INTRAMUSCULAR | Status: DC | PRN

## 2011-07-24 MED ORDER — FUROSEMIDE 10 MG/ML IJ SOLN
40.0000 mg | Freq: Once | INTRAMUSCULAR | Status: AC
  Administered 2011-07-24: 40 mg via INTRAVENOUS
  Filled 2011-07-24: qty 4

## 2011-07-24 MED ORDER — ONDANSETRON HCL 4 MG/2ML IJ SOLN
4.0000 mg | Freq: Once | INTRAMUSCULAR | Status: AC
  Administered 2011-07-24: 4 mg via INTRAVENOUS

## 2011-07-24 MED ORDER — METHYLPREDNISOLONE SODIUM SUCC 125 MG IJ SOLR
125.0000 mg | Freq: Once | INTRAMUSCULAR | Status: AC
  Administered 2011-07-24: 125 mg via INTRAVENOUS
  Filled 2011-07-24: qty 2

## 2011-07-24 MED ORDER — ONDANSETRON HCL 4 MG/2ML IJ SOLN: 4.0000 mg | Freq: Four times a day (QID) | INTRAMUSCULAR | Status: DC | PRN

## 2011-07-24 MED ORDER — SIMVASTATIN 20 MG PO TABS
20.0000 mg | ORAL_TABLET | Freq: Every day | ORAL | Status: DC
  Administered 2011-07-24: 20 mg via ORAL
  Filled 2011-07-24 (×2): qty 1

## 2011-07-24 MED ORDER — ADULT MULTIVITAMIN W/MINERALS CH
1.0000 | ORAL_TABLET | Freq: Every day | ORAL | Status: DC
  Administered 2011-07-25 – 2011-08-03 (×9): 1 via ORAL
  Filled 2011-07-24 (×10): qty 1

## 2011-07-24 MED ORDER — ASPIRIN 325 MG PO TABS
325.0000 mg | ORAL_TABLET | Freq: Every day | ORAL | Status: DC
  Administered 2011-07-25: 325 mg via ORAL
  Filled 2011-07-24 (×2): qty 1

## 2011-07-24 MED ORDER — INSULIN ASPART 100 UNIT/ML ~~LOC~~ SOLN
0.0000 [IU] | Freq: Three times a day (TID) | SUBCUTANEOUS | Status: DC
  Administered 2011-07-25: 3 [IU] via SUBCUTANEOUS
  Administered 2011-07-25: 5 [IU] via SUBCUTANEOUS
  Administered 2011-07-25 – 2011-07-27 (×2): 1 [IU] via SUBCUTANEOUS
  Administered 2011-07-28 (×2): 2 [IU] via SUBCUTANEOUS
  Administered 2011-07-28: 1 [IU] via SUBCUTANEOUS
  Administered 2011-07-29: 2 [IU] via SUBCUTANEOUS
  Administered 2011-07-29: 3 [IU] via SUBCUTANEOUS
  Administered 2011-07-29: 2 [IU] via SUBCUTANEOUS
  Administered 2011-07-30 (×2): 3 [IU] via SUBCUTANEOUS
  Administered 2011-07-30: 2 [IU] via SUBCUTANEOUS
  Administered 2011-07-31 (×2): 7 [IU] via SUBCUTANEOUS
  Administered 2011-07-31: 9 [IU] via SUBCUTANEOUS
  Administered 2011-08-01: 7 [IU] via SUBCUTANEOUS
  Administered 2011-08-01: 9 [IU] via SUBCUTANEOUS
  Administered 2011-08-02 (×3): 5 [IU] via SUBCUTANEOUS
  Administered 2011-08-03: 3 [IU] via SUBCUTANEOUS
  Administered 2011-08-03: 5 [IU] via SUBCUTANEOUS

## 2011-07-24 MED ORDER — ACETAMINOPHEN 325 MG PO TABS: 650.0000 mg | ORAL_TABLET | ORAL | Status: DC | PRN

## 2011-07-24 MED ORDER — SODIUM CHLORIDE 0.9 % IV SOLN
INTRAVENOUS | Status: DC
  Administered 2011-07-24: 999 mL via INTRAVENOUS

## 2011-07-24 NOTE — Progress Notes (Signed)
K result 5.6 and Trop 2.25/ ckmb 8.3 called to [facility]. Np will follow up as needed.

## 2011-07-24 NOTE — ED Notes (Signed)
Code stroke was cancelled per dr Thad Ranger.

## 2011-07-24 NOTE — ED Notes (Signed)
Pt repositioned in bed, nad noted, tolerateing bi pap well, denies needs.

## 2011-07-24 NOTE — Care Management Note (Addendum)
    Page 1 of 1   07/27/2011     2:59:27 PM   CARE MANAGEMENT NOTE 07/27/2011  Patient:  [name], [name]   Account Number:  [id number]  Date Initiated:  07/24/2011  Documentation initiated by:  [facility]  Subjective/Objective Assessment:   61 yo with hx of chronic S-CHF presented to the ED as a Code Stroke after being pulled from her first hyperbaric O2 treatment unresponsive and diaphoretic with a fixed gaze. Code stroke was canceled->A on C HF.     Action/Plan:   ICU admission as IP. Bipap/steroids/lasix. Has used [facility] in the past-   Anticipated DC Date:  07/29/2011   Anticipated DC Plan:  HOME W HOME HEALTH SERVICES      DC Planning Services  CM consult      [facility] Choice  HOME HEALTH   Choice offered to / List presented to:  C-3 Spouse        HH arranged  HH-1 RN  HH-10 DISEASE MANAGEMENT      HH agency  [facility]   Status of service:   Medicare Important Message given?   (If response is "NO", the following Medicare IM given date fields will be blank) Date Medicare IM given:   Date Additional Medicare IM given:    Discharge Disposition:  HOME W HOME HEALTH SERVICES  Per UR Regulation:  Reviewed for med. necessity/level of care/duration of stay  If discussed at Long Length of Stay Meetings, dates discussed:    Comments:  5/31 15:00 debbie Meylin Stenzel rn,bsn spoke w pt/husband. h[facility] of [facility]. [facility] would like [facility]n again w [facility]. md order for chf screen. have given [facility] update and await final orders from md at [facility].  5/29 10:16a debbie Reed Eifert rn,bsn [phone] heart failure screen. lives w husband, pcp dr Vista Lawman. hx of [facility].

## 2011-07-24 NOTE — Progress Notes (Signed)
ANTICOAGULATION CONSULT NOTE - Initial Consult   Pharmacy Consult for Heparin  Indication: NSTEMI   Allergies  Allergen Reactions  . Clopidogrel Bisulfate Hives  . Codeine Nausea And Vomiting  . Morphine Nausea And Vomiting    Patient Measurements: Weight: 162 lb 14.7 oz (73.9 kg) Heparin Dosing Weight: 73.9kg BMI - 27.2  Vital Signs: Temp: 97.8 F (36.6 C) (05/28 1644) Temp src: Oral (05/28 1700) BP: 95/46 mmHg (05/28 1700) Pulse Rate: 76  (05/28 1700)  Labs:  Basename 07/24/11 1004 07/24/11 0944  HGB 13.6 11.9*  HCT 40.0 36.7  PLT -- 330  APTT -- 35  LABPROT -- 15.2  INR -- 1.18  HEPARINUNFRC -- --  CREATININE 1.30* 1.29*  CKTOTAL -- 143  CKMB -- 6.0*  TROPONINI -- 1.31*    The CrCl is unknown because both a height and weight (above a minimum accepted value) are required for this calculation.   Medical History: Past Medical History  Diagnosis Date  . Coronary atherosclerosis of native coronary artery     BMS circ 2001, DES LAD 2005, DES LAD and RCA 2008, subsequent CABG  . Drug allergy     Allergy to Plavix (hives) - took Ticlid in past  . Essential hypertension, benign   . Mixed hyperlipidemia   . Type 2 diabetes mellitus   . Myocardial infarction ~ 2002  . Arthritis     rheumatoid  . Depression   . Peripheral neuropathy   . Peptic ulcer disease ~ 1975  . Nephrolithiasis 02/07/2011  . Angina   . Breast cancer     Chemo and mastectomy- right  . Diabetes mellitus   . Chronic systolic heart failure     LVEF 40-45%  . Anxiety   . Sternal wound infection     CABG wound    Medications:  Prescriptions prior to admission  Medication Sig Dispense Refill  . aspirin 325 MG tablet Take 325 mg by mouth daily.       . carvedilol (COREG) 3.125 MG tablet Take 1 tablet (3.125 mg total) by mouth 2 (two) times daily.  180 tablet  3  . clarithromycin (BIAXIN) 500 MG tablet Take 500 mg by mouth 2 (two) times daily. Started at wound clinic      . gabapentin  (NEURONTIN) 300 MG capsule Take 300 mg by mouth 3 (three) times daily.       . insulin glargine (LANTUS) 100 UNIT/ML injection Inject 35 Units into the skin 2 (two) times daily.       . insulin lispro (HUMALOG) 100 UNIT/ML injection Inject 20 Units into the skin 3 (three) times daily before meals.       . Multiple Vitamin (MULITIVITAMIN WITH MINERALS) TABS Take 1 tablet by mouth daily.      Marland Kitchen PARoxetine (PAXIL) 20 MG tablet Take 20 mg by mouth every morning.       . simvastatin (ZOCOR) 20 MG tablet Take 1 tablet (20 mg total) by mouth at bedtime.  90 tablet  1  . traMADol (ULTRAM) 50 MG tablet Take 50 mg by mouth every 6 (six) hours as needed. For pain        Assessment: 60yo admitted with acute respiratory failure, NSTEMI, unresponsiveness.  To be started on IV Heparin for NSTEMI.  CT of the head revealed no evidence of hemorrhage or infarct.  Chest Xray - likely CHF exacerbation.  Goal of Therapy:  Heparin level 0.3-0.7 units/ml Monitor platelets by anticoagulation protocol: Yes   Plan:  Heparin 4000 units IV bolus x 1   Heparin at 1000 units/hr   Obtain a heparin level in 6 hours to evaluate response  Monitor for s/s of bleeding   Nadara Mustard, PharmD., MS Clinical Pharmacist Pager:  [phone] Thank you for allowing pharmacy to be part of this patients care team. 07/24/2011,5:13 PM

## 2011-07-24 NOTE — ED Notes (Signed)
Pt code stroke, see narrator ntoe

## 2011-07-24 NOTE — Progress Notes (Signed)
Pt taken off NIV and placed on 4lpm [place]. Pt tolerating [place] at this time and maintaining sats of 100%. Pt currently doing swallow evaluation with RN and is no obvious respiratory distress. RT will continue to monitor.

## 2011-07-24 NOTE — ED Notes (Signed)
RT at bedside placing pt on bipap. 

## 2011-07-24 NOTE — ED Notes (Signed)
Cardiology at bedside for evaluation and possible admission.

## 2011-07-24 NOTE — Code Documentation (Signed)
Pt is a 61 yo female who had CABG in November 2012 and has been battling a sternal wound infection; just this AM went into the hyperbaric chamber around 0745, and when during the treatment she was noted to be barely responsive, diaphoretic, SOB with increased work of breathing, gaze preference to the right and not moving any extremities to command. EMS was called and code stroke activated at  0931. Enroute, pt was noted to move all extremities with purpose, but still very lethargic and with upward gaze.  She arrived at [phone] and was cleared at the bridge by Dr. Fonnie Jarvis at that time after checking SaO2 which was 94%. (Stroke team arrived at [phone]).  CT was unremarkable. Returned to [employer] A-3 and NIHSS was 17 (see doc flowsheet).  She now has full gaze, but generally weak and unable to converse.  Code stroke canceled at 1014.

## 2011-07-24 NOTE — H&P (Signed)
CARDIOLOGY H&P  Patient ID: [name], MRN: [id number], DOB/AGE: Feb 27, 1950 61 y.o. Admit date: 07/24/2011   Date of Consult: 07/24/2011 Primary Physician: Leone Payor, MD, MD Primary Cardiologist: Dr. Diona Browner  Chief Complaint: unresponsive, diaphoretic Reason for Consult: CHF/NSTEMI  HPI: 61 y/o F with hx of CAD s/p CABG 12/2010 complicated by sternal osteomyelitis, breast CA, chronic systolic CHF with EF 40-45% presented to [facility] after being transferred from hyperbaric oxygen therapy - today was her very first treatment. She entered the chamber at 7:45AM but was taken out unresponsive, diaphoretic in severe respiratory distress with acute respiratory failure with O2 in the 60s improving to 90-100% on mask. Code Stroke was called and neurology saw the patient urgently because at time of arrival in the ER, the patient was felt to have left-sided weakness and right gaze preference. CT of head was nonacute. Neurology suspected cardiac etiology as the patient's mentation improved with O2 supplementation.  She has felt progressively worse in terms of SOB/DOE for the last 2-3 weeks. She denies chest pain. She went on a cruise and returned 07/19/11. She reports sticking with a low-salt diet at that time and keeping up with medicines. She denies any LEE, fevers or chills. She has had some orthopnea.  Labs are pertinent for K of 5.9, WBC 17.5, POC troponin of 1.02/regular troponin of 1.31, UA with small bili, tr leuks and large hemoglobin, pBNP 3723. ABG shows pH of 7.082, pCO2 of 89, pO2 123. She was given albuterol/atrovent nebs, 125mg  of Solu-Medtrol, 40mg  of IV Lasix. Last BP's have been running in the 90s-110s. She remains on bipap and feels better but complaints of having a headache.  Past Medical History  Diagnosis Date  . Coronary atherosclerosis of native coronary artery     BMS circ 2001, DES LAD 2005, DES LAD and RCA 2008, subsequent CABG  . Drug allergy     Allergy  to Plavix (hives) - took Ticlid in past  . Essential hypertension, benign   . Mixed hyperlipidemia   . Type 2 diabetes mellitus   . Myocardial infarction ~ 2002  . Arthritis     rheumatoid  . Depression   . Peripheral neuropathy   . Peptic ulcer disease ~ 1975  . Nephrolithiasis 02/07/2011  . Angina   . Breast cancer     Chemo and mastectomy- right  . Diabetes mellitus   . Chronic systolic heart failure     LVEF 40-45%  . Anxiety   . Sternal wound infection     CABG wound      Most Recent Cardiac Studies: 2D Echo 02/2011 Study Conclusions  - Left ventricle: The cavity size was normal. [place] thickness was normal. Systolic function was mildly to moderately reduced. The estimated ejection fraction was in the range of 40% to 45%. Diffuse hypokinesis. There is severe hypokinesis of the apical myocardium. Doppler parameters  are consistent with abnormal left ventricular relaxation (grade 1 diastolic dysfunction). - Left atrium: The atrium was mildly dilated. - Right atrium: The atrium was mildly dilated. - Pulmonary arteries: Systolic pressure was mildly increased. PA peak pressure: 31mm Hg (S). - Pericardium, extracardiac: A trivial pericardial effusion was identified. Impressions: - Cannot R/O apical thrombus; suggest fu limited study with contrast to better assess.      Surgical History:  Past Surgical History  Procedure Date  . Mastectomy 1988    right  . Cholecystectomy 1969  . Abdominal hysterectomy 1989  . Tonsillectomy and adenoidectomy 1979  .  Appendectomy 1969  . Tubal ligation 1972  . Coronary artery bypass graft 01/11/2011    Procedure: CORONARY ARTERY BYPASS GRAFTING (CABG);  Surgeon: Kathlee Nations Suann Larry, MD;  Location: [facility] OR;  Service: Open Heart Surgery;  Laterality: N/A;  cabg x four, using left internal mammary artery and right leg greater saphenous vein harvestede endoscopically  . Sternal wound debridement 03/01/2011    Procedure: STERNAL WOUND DEBRIDEMENT;   Surgeon: Kathlee Nations Suann Larry, MD;  Location[facility] OR;  Service: Open Heart Surgery;  Laterality: N/A;  . Application of wound vac 03/01/2011    Procedure: APPLICATION OF WOUND VAC;  Surgeon: Kathlee Nations Suann Larry, MD;  Location: [facility] OR;  Service: Open Heart Surgery;  Laterality: N/A;  . Breast surgery   . Sternal wires removal 04/02/2011    Procedure: STERNAL WIRES REMOVAL;  Surgeon: Kathlee Nations Suann Larry, MD;  Location: [facility] OR;  Service: Open Heart Surgery;  Laterality: N/A;     Home Meds: Prior to Admission medications   Medication Sig Start Date End Date Taking? Authorizing Provider  aspirin 325 MG tablet Take 325 mg by mouth daily.    Yes Historical Provider, MD  carvedilol (COREG) 3.125 MG tablet Take 1 tablet (3.125 mg total) by mouth 2 (two) times daily. 05/21/11 05/20/12 Yes Jonelle Sidle, MD  clarithromycin (BIAXIN) 500 MG tablet Take 500 mg by mouth 2 (two) times daily. Started at wound clinic   Yes Historical Provider, MD  gabapentin (NEURONTIN) 300 MG capsule Take 300 mg by mouth 3 (three) times daily.    Yes Historical Provider, MD  insulin glargine (LANTUS) 100 UNIT/ML injection Inject 35 Units into the skin 2 (two) times daily.  01/24/11  Yes Donielle Margaretann Loveless, PA  insulin lispro (HUMALOG) 100 UNIT/ML injection Inject 20 Units into the skin 3 (three) times daily before meals.  01/24/11  Yes Donielle Margaretann Loveless, PA  Multiple Vitamin (MULITIVITAMIN WITH MINERALS) TABS Take 1 tablet by mouth daily.   Yes Historical Provider, MD  PARoxetine (PAXIL) 20 MG tablet Take 20 mg by mouth every morning.    Yes Historical Provider, MD  simvastatin (ZOCOR) 20 MG tablet Take 1 tablet (20 mg total) by mouth at bedtime. 04/05/11  Yes Jonelle Sidle, MD  traMADol (ULTRAM) 50 MG tablet Take 50 mg by mouth every 6 (six) hours as needed. For pain 03/22/11  Yes Historical Provider, MD    Inpatient Medications:    . albuterol  5 mg Nebulization Once  . furosemide  40 mg Intravenous Once  . ipratropium   0.5 mg Nebulization Once  . methylPREDNISolone (SOLU-MEDROL) injection  125 mg Intravenous Once  . ondansetron (ZOFRAN) IV  4 mg Intravenous Once    Allergies:  Allergies  Allergen Reactions  . Clopidogrel Bisulfate Hives  . Codeine Nausea And Vomiting  . Morphine Nausea And Vomiting    History   Social History  . Marital Status: Married    Spouse Name: N/A    Number of Children: N/A  . Years of Education: N/A   Occupational History  . Not on file.   Social History Main Topics  . Smoking status: Former Smoker -- 1.0 packs/day for 20 years    Types: Cigarettes    Quit date: 02/27/2007  . Smokeless tobacco: Never Used  . Alcohol Use: No  . Drug Use: No  . Sexually Active: Yes    Birth Control/ Protection: Post-menopausal   Other Topics Concern  . Not on file  Social History Narrative  . No narrative on file     Family History  Problem Relation Age of Onset  . Anesthesia problems Neg Hx      Review of Systems: General: negative for chills, fever, night sweats or weight changes.  Cardiovascular: see above Dermatological: negative for rash Respiratory: negative for cough or wheezing Urologic: negative for hematuria Abdominal: negative for nausea, vomiting, diarrhea, bright red blood per rectum, melena, or hematemesis Neurologic: negative for visual changes. +unresponsive earlier All other systems reviewed and are otherwise negative except as noted above.  Labs:  [facility] Aug 19, 2011 0944  CKTOTAL 143  CKMB 6.0*  TROPONINI 1.31*   Lab Results  Component Value Date   WBC 17.5* August 19, 2011   HGB 13.6 19-Aug-2011   HCT 40.0 2011-08-19   MCV 85.3 08/19/2011   PLT 330 08/19/2011    Lab 08-19-11 1004 2011-08-19 0944  NA 140 --  K 6.0* --  CL 108 --  CO2 -- 24  BUN 25* --  CREATININE 1.30* --  CALCIUM -- 9.2  PROT -- 8.7*  BILITOT -- 0.2*  ALKPHOS -- 102  ALT -- 20  AST -- 29  GLUCOSE 272* --   Lab Results  Component Value Date   CHOL 146 01/06/2011    HDL 35* 01/06/2011   LDLCALC 78 01/06/2011   TRIG 165* 01/06/2011   Radiology/Studies:  1. Ct Head Wo Contrast 2011/08/19  *RADIOLOGY REPORT*  Clinical Data: Patient unresponsive with right gaze preference.  CT HEAD WITHOUT CONTRAST  Technique:  Contiguous axial images were obtained from the base of the skull through the vertex without contrast.  Comparison: None.  Findings: The brain demonstrates no evidence of hemorrhage, infarction, edema, mass effect, extra-axial fluid collection, hydrocephalus or mass lesion.  The skull is unremarkable.  IMPRESSION: No acute findings by CT of the brain.  Findings were communicated directly to Dr. Fonnie Jarvis.  Original Report Authenticated By: Reola Calkins, M.D.   2. Dg Chest Port 1 View August 19, 2011  *RADIOLOGY REPORT*  Clinical Data: Shortness of breath.  PORTABLE CHEST - 1 VIEW  Comparison: 06/29/2011  Findings: Worsening interstitial prominence throughout the lungs, likely interstitial edema.  Mild cardiomegaly.  Small to moderate bilateral pleural effusions, right greater than left, slightly increased since prior study.  Bibasilar atelectasis.  Prior CABG.  IMPRESSION: Worsening interstitial prominence, likely interstitial edema/CHF. Enlarging bilateral effusions.  Original Report Authenticated By: Cyndie Chime, M.D.   EKG: sinus tach 121 bpm RBBB, LAFB. Nonspecific ST-T changes  Physical Exam Blood pressure 101/54, pulse 70, temperature 97.2 F (36.2 C), temperature source Temporal, resp. rate 15, last menstrual period 01/05/1996, SpO2 100.00%. General: Well developed overweight WF in no acute distress on bipap Head: Normocephalic, atraumatic, sclera non-icteric, no xanthomas, nares are without discharge.  Neck: Negative for carotid bruits. JVD not elevated. Lungs: Decreased BS bilaterally to auscultation without wheezes, rales, or rhonchi. Breathing is unlabored but she remains on bipap. Heart: RRR with S1 S2. No murmurs, rubs, or gallops  appreciated. Abdomen: Soft, non-tender, non-distended with normoactive bowel sounds. No hepatomegaly. No rebound/guarding. No obvious abdominal masses. Msk:  Strength and tone appear normal for age. Extremities: No clubbing or cyanosis. No edema.  Distal pedal pulses are 2+ and equal bilaterally. She has very mild petechiae on chest and LUE. Neuro: Alert and oriented X 3. Bipap makes it difficult to talk but she writes without hesitation. Moves all extremities spontaneously. Psych:  Responds to questions appropriately with a normal affect.   Assessment  and Plan: The patient was seen and examined by myself and Dr. Daleen Squibb and the plan below reflects our discussion regarding all aspects of the encounter.  1. Acute respiratory failure felt related to acute on chronic systolic congestive heart failure. This began in the setting of her first hyperbaric oxygen treatment which may be the precipitant. We will attempt to de-escalate her bipap given her marked improvement. She is still putting out urine from the IV Lasix she received earlier so we will hold off on pressing further diuresis. Last echo showed EF 40-45% (also mentioned that the study could not r/o apical thrombus) - .  CXR does not reveal a pneumonia and she denies cough. WBC may be due to acute process of acidosis. 2. Altered mental status, likely secondary to hypoxia - improved. Per neurology, no further neuro workup. 3. NSTEMI - enzymes may be more likely due to demand ischemia, but will cycle them to rule out ACS. Will initiate heparin per pharmacy. Continue ASA. Will hold off on BB today given acute decompensation. Consider resumption in AM if stable. 4. Hyperkalemia - may be related to pH change. Has already received nebs, IV Lasix. Will draw another potassium now. 5. Hematuria - will need to be followed. UCx has been drawn.  Signed, Dayna Dunn PA-C 07/24/2011, 2:13 PM  I have taken a history, reviewed medications, allergies, PMH, SH, FH,  and reviewed ROS and examined the patient.  I agree with the assessment and plan. Dr Donata Clay called and will evaluate her while in hospital. Do not feel she will tolerate hyperbaric treatment. Discussed with patient and daughter. Carson Meche C. Daleen Squibb, MD, [facility] Pager:  [phone]

## 2011-07-24 NOTE — ED Notes (Signed)
Pt requesting water, unable at this time, due to bipap in place, nausea meds held no nausea at present.

## 2011-07-24 NOTE — Progress Notes (Signed)
Patient examined and CXR,labs reviewd Prob pul hypertension/edema induced by hyperbaric O2 therapy Will start local silver alginate dressings to small sternal defect   Will follow.

## 2011-07-24 NOTE — ED Notes (Signed)
Troponin results given to dr. Fonnie Jarvis.

## 2011-07-24 NOTE — ED Provider Notes (Signed)
History     CSN: [id number]  Arrival date & time 07/24/11  4098   First MD Initiated Contact with Patient 07/24/11 [id number]      Chief Complaint  Patient presents with  . 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 Apparently at baseline the patient is awake alert able to speak and talk. The patient entered the hyperbaric chamber last known well at 745 this morning. She came out of the hyperbaric chamber unresponsive diaphoretic in severe respiratory distress with hypoxemia with room air pulse oximetry in the 60s improving to the 90s to 100% oxygen mask from EMS. Patient is nonverbal unresponsive slightly moving all 4 extremities with a nonlateralizing pattern but has eyes deviated towards the right not moving past the midline towards the left. Her eyes open spontaneously, she is nonverbal, she moves all 4 extremities spontaneously but does not follow commands. Past Medical History  Diagnosis Date  . Coronary atherosclerosis of native coronary artery     BMS circ 2001, DES LAD 2005, DES LAD and RCA 2008, subsequent CABG  . Drug allergy     Allergy to Plavix (hives) - took Ticlid in past  . Essential hypertension, benign   . Mixed hyperlipidemia   . Type 2 diabetes mellitus   . Myocardial infarction ~ 2002  . Arthritis     rheumatoid  . Depression   . Peripheral neuropathy   . Peptic ulcer disease ~ 1975  . Nephrolithiasis 02/07/2011  . Angina   . Breast cancer     Chemo and mastectomy- right  . Diabetes mellitus   . Chronic systolic heart failure     LVEF 40-45%  . Anxiety   . Sternal wound infection     CABG wound    Past Surgical History  Procedure Date  . Mastectomy 1988    right  . Cholecystectomy  1969  . Abdominal hysterectomy 1989  . Tonsillectomy and adenoidectomy 1979  . Appendectomy 1969  . Tubal ligation 1972  . Coronary artery bypass graft 01/11/2011    Procedure: CORONARY ARTERY BYPASS GRAFTING (CABG);  Surgeon: Kathlee Nations Suann Larry, MD;  Location: [facility] OR;  Service: Open Heart Surgery;  Laterality: N/A;  cabg x four, using left internal mammary artery and right leg greater saphenous vein harvestede endoscopically  . Sternal wound debridement 03/01/2011    Procedure: STERNAL WOUND DEBRIDEMENT;  Surgeon: Kathlee Nations Suann Larry, MD;  Location: [facility] OR;  Service: Open Heart Surgery;  Laterality: N/A;  . Application of wound vac 03/01/2011    Procedure: APPLICATION OF WOUND VAC;  Surgeon: Kathlee Nations Suann Larry, MD;  Location: [facility] OR;  Service: Open Heart Surgery;  Laterality: N/A;  . Breast surgery   . Sternal wires removal 04/02/2011    Procedure: STERNAL WIRES REMOVAL;  Surgeon: Kathlee Nations Suann Larry, MD;  Location: [facility] OR;  Service: Open Heart Surgery;  Laterality: N/A;    Family History  Problem Relation Age of Onset  . Anesthesia problems Neg Hx     History  Substance Use Topics  . Smoking status: Former Smoker -- 1.0 packs/day for 20 years    Types: Cigarettes    Quit date: 02/27/2007  . Smokeless tobacco: Never Used  . Alcohol Use: No    OB History  Grav Para Term Preterm Abortions TAB SAB Ect Mult Living                  Review of Systems  Unable to perform ROS: Mental status change    Allergies  Clopidogrel bisulfate; Codeine; and Morphine  Home Medications   No current outpatient prescriptions on file.  BP 117/52  Pulse 89  Temp(Src) 98.2 F (36.8 C) (Oral)  Resp 26  Ht 5' 4.96" (1.65 m)  Wt 168 lb 6.9 oz (76.4 kg)  BMI 28.06 kg/m2  SpO2 100%  LMP 01/05/1996  Physical Exam  Nursing note and vitals reviewed. Constitutional:       Awake, nonverbal, does not follow commands.gcs eye 4, verbal 1, motor 5  HENT:  Head: Atraumatic.  Eyes: Conjunctivae are  normal. Pupils are equal, round, and reactive to light. Right eye exhibits no discharge. Left eye exhibits no discharge.  Neck: Neck supple.  Cardiovascular: Normal rate and regular rhythm.   No murmur heard. Pulmonary/Chest: She is in respiratory distress. She has wheezes. She has rales. She exhibits tenderness.       Retractions, moderate restrictive distress, diffuse decreased breath sounds with diffuse wheezing bibasilar crackles and external chronic chest wound without current drainage or evidence of cellulitis  Abdominal: Soft. There is no tenderness. There is no rebound.  Musculoskeletal: She exhibits no edema and no tenderness.       Baseline ROM, no obvious new focal weakness.  Neurological:       gcs 10, nonverbal, eyes open, eyes deviated to right and do not cross midline towards the left, patient was overshot his not to command but moves them spontaneously with no lateralizing deficits noted  Skin: No rash noted.  Psychiatric:       Unable to assess    ED Course  Procedures (including critical care time) ECG: Sinus tachycardia, ventricular rate 121, left axis deviation, right bundle branch block, left anterior fascicular block, no definite acute ischemic changes noted compared with January 2013  Pt now awake and alert follows simple commands moving all 4 ext EOMI denies CP has SOB oriented x 3 but mildly confused repeats questions/phrases 1045  Much better on Bipap no resp distress, relates 2-3 weeks gradual onset SOB, Cards paged who may rec PCCM.    Cards to admit.  CRITICAL CARE Performed by: Hurman Horn   Total critical care time:  Critical care time was exclusive of separately billable procedures and treating other patients.  Critical care was necessary to treat or prevent imminent or life-threatening deterioration.  Critical care was time spent personally by me on the following activities: development of treatment plan with patient and/or surrogate as  well as nursing, discussions with consultants, evaluation of patient's response to treatment, examination of patient, obtaining history from patient or surrogate, ordering and performing treatments and interventions, ordering and review of laboratory studies, ordering and review of radiographic studies, pulse oximetry and re-evaluation of patient's condition. Labs Reviewed  CBC - Abnormal; Notable for the following:    WBC 17.5 (*)    Hemoglobin 11.9 (*)    All other components within normal limits  DIFFERENTIAL - Abnormal; Notable for the following:    Neutro Abs 8.9 (*)    Lymphs Abs 7.0 (*)    Monocytes Absolute 1.2 (*)    All other components within normal limits  COMPREHENSIVE METABOLIC PANEL - Abnormal; Notable for the following:    Potassium 5.9 (*)    Glucose, Bld 261 (*)  Creatinine, Ser 1.29 (*)    Total Protein 8.7 (*)    Total Bilirubin 0.2 (*)    GFR calc non Af Amer 44 (*)    GFR calc Af Amer 51 (*)    All other components within normal limits  CK TOTAL AND CKMB - Abnormal; Notable for the following:    CK, MB 6.0 (*)    Relative Index 4.2 (*)    All other components within normal limits  TROPONIN I - Abnormal; Notable for the following:    Troponin I 1.31 (*)    All other components within normal limits  PRO B NATRIURETIC PEPTIDE - Abnormal; Notable for the following:    Pro B Natriuretic peptide (BNP) 3723.0 (*)    All other components within normal limits  URINALYSIS, ROUTINE W REFLEX MICROSCOPIC - Abnormal; Notable for the following:    Color, Urine AMBER (*) BIOCHEMICALS MAY BE AFFECTED BY COLOR   APPearance CLOUDY (*)    Hgb urine dipstick LARGE (*)    Bilirubin Urine SMALL (*)    Leukocytes, UA TRACE (*)    All other components within normal limits  POCT I-STAT, CHEM 8 - Abnormal; Notable for the following:    Potassium 6.0 (*)    BUN 25 (*)    Creatinine, Ser 1.30 (*)    Glucose, Bld 272 (*)    All other components within normal limits  POCT I-STAT  TROPONIN I - Abnormal; Notable for the following:    Troponin i, poc 1.02 (*)    All other components within normal limits  GLUCOSE, CAPILLARY - Abnormal; Notable for the following:    Glucose-Capillary 262 (*)    All other components within normal limits  POCT I-STAT 3, BLOOD GAS (G3+) - Abnormal; Notable for the following:    pH, Arterial 7.082 (*)    pCO2 arterial 89.2 (*)    pO2, Arterial 123.0 (*)    Bicarbonate 26.6 (*)    Acid-base deficit 5.0 (*)    All other components within normal limits  URINE MICROSCOPIC-ADD ON - Abnormal; Notable for the following:    Crystals URIC ACID CRYSTALS (*)    All other components within normal limits  POTASSIUM - Abnormal; Notable for the following:    Potassium 6.4 (*)    All other components within normal limits  POTASSIUM - Abnormal; Notable for the following:    Potassium 5.6 (*)    All other components within normal limits  BASIC METABOLIC PANEL - Abnormal; Notable for the following:    Sodium 134 (*)    Glucose, Bld 266 (*)    BUN 35 (*)    Creatinine, Ser 1.11 (*)    GFR calc non Af Amer 52 (*)    GFR calc Af Amer 61 (*)    All other components within normal limits  CARDIAC PANEL(CRET KIN+CKTOT+MB+TROPI) - Abnormal; Notable for the following:    CK, MB 8.3 (*)    Troponin I 2.25 (*)    Relative Index 6.1 (*)    All other components within normal limits  CARDIAC PANEL(CRET KIN+CKTOT+MB+TROPI) - Abnormal; Notable for the following:    CK, MB 8.2 (*) CRITICAL VALUE NOTED.  VALUE IS CONSISTENT WITH PREVIOUSLY REPORTED AND CALLED VALUE.   Troponin I 1.53 (*)    Relative Index 6.7 (*)    All other components within normal limits  CBC - Abnormal; Notable for the following:    RBC 3.25 (*)    Hemoglobin 8.8 (*)  HCT 27.1 (*)    Platelets 134 (*)    All other components within normal limits  HEPARIN LEVEL (UNFRACTIONATED) - Abnormal; Notable for the following:    Heparin Unfractionated 0.22 (*)    All other components within  normal limits  CARDIAC PANEL(CRET KIN+CKTOT+MB+TROPI) - Abnormal; Notable for the following:    CK, MB 7.4 (*) CRITICAL VALUE NOTED.  VALUE IS CONSISTENT WITH PREVIOUSLY REPORTED AND CALLED VALUE.   Troponin I 1.27 (*)    Relative Index 7.3 (*)    All other components within normal limits  GLUCOSE, CAPILLARY - Abnormal; Notable for the following:    Glucose-Capillary 355 (*)    All other components within normal limits  HEPARIN LEVEL (UNFRACTIONATED) - Abnormal; Notable for the following:    Heparin Unfractionated 0.28 (*)    All other components within normal limits  GLUCOSE, CAPILLARY - Abnormal; Notable for the following:    Glucose-Capillary 262 (*)    All other components within normal limits  GLUCOSE, CAPILLARY - Abnormal; Notable for the following:    Glucose-Capillary 291 (*)    All other components within normal limits  GLUCOSE, CAPILLARY - Abnormal; Notable for the following:    Glucose-Capillary 213 (*)    All other components within normal limits  CBC - Abnormal; Notable for the following:    WBC 11.4 (*)    RBC 3.75 (*)    Hemoglobin 10.3 (*)    HCT 31.6 (*)    All other components within normal limits  PROTIME-INR  APTT  URINE RAPID DRUG SCREEN (HOSP PERFORMED)  URINE CULTURE  CULTURE, BLOOD (ROUTINE X 2)  CULTURE, BLOOD (ROUTINE X 2)  LACTIC ACID, PLASMA  PROCALCITONIN  PATHOLOGIST SMEAR REVIEW  MRSA PCR SCREENING  HEPARIN LEVEL (UNFRACTIONATED)  BASIC METABOLIC PANEL  HEPARIN LEVEL (UNFRACTIONATED)  HEMOGLOBIN A1C  LIPID PANEL   Ct Head Wo Contrast  07/24/2011  *RADIOLOGY REPORT*  Clinical Data: Patient unresponsive with right gaze preference.  CT HEAD WITHOUT CONTRAST  Technique:  Contiguous axial images were obtained from the base of the skull through the vertex without contrast.  Comparison: None.  Findings: The brain demonstrates no evidence of hemorrhage, infarction, edema, mass effect, extra-axial fluid collection, hydrocephalus or mass lesion.  The  skull is unremarkable.  IMPRESSION: No acute findings by CT of the brain.  Findings were communicated directly to Dr. Fonnie Jarvis.  Original Report Authenticated By: Reola Calkins, M.D.   Mr Maxine Glenn Head Wo Contrast  07/25/2011  *RADIOLOGY REPORT*  Clinical Data:  High blood pressure.  Diabetes.  Hyperlipidemia. Presenting with transient episode of left-sided weakness and right gaze preference.  MRI HEAD WITHOUT CONTRAST MRA HEAD WITHOUT CONTRAST  Technique: Multiplanar, multiecho pulse sequences of the brain and surrounding structures were obtained according to standard protocol without intravenous contrast.  Angiographic images of the head were obtained using MRA technique without contrast.  Comparison: 07/24/2011 CT.  No comparison MR.  MRI HEAD  Findings:  No acute infarct.  No intracranial hemorrhage.  Mild small vessel disease type changes.  No intracranial mass lesion detected on this unenhanced exam.  No hydrocephalus.  Opacification mastoid air cells and middle ear cavities more notable on the right. No obstructing lesions seen in the region of posterior-superior nasopharynx.  Minimal paranasal sinus mucosal thickening.  Mild narrowing upper cervical canal.  Mild exophthalmos.  Major intracranial vascular structures are patent.  IMPRESSION: No acute infarct.  Mild small vessel disease type changes.  Opacification mastoid air cells and  middle ear cavities more notable on the right.  MRA HEAD  Findings: Mild to moderate narrowing involving portions of the right internal carotid artery cavernous segment and mild narrowing and irregularity of the portions of the left internal carotid artery cavernous segment.  Small bulge M1 segment left middle cerebral artery.  This appears to be origin of a vessel. Tiny aneurysm not entirely excluded. This is at the limitation of the present exam.  Middle cerebral artery branch vessel irregularity.  Left vertebral artery is dominant.  Mild irregularity right vertebral artery.   Mild irregularity of the basilar artery without high-grade stenosis.  Nonvisualization PICAs.  Fusiform dilated the P1 segment of the left posterior cerebral artery.  Mild narrowing P1 segment right posterior cerebral artery. Moderate to marked stenosis P1-2 segment right posterior cerebral artery.  Posterior cerebral artery distal branch vessel irregularity greater on the right.  IMPRESSION: Intracranial atherosclerotic type changes as detailed above.  Minimal bulge M1 segment left middle cerebral artery may represent origin of a vessel.  Tiny aneurysm not entirely excluded however this is at the limitation of the present exam.  Original Report Authenticated By: Fuller Canada, M.D.   Mr Brain Wo Contrast  07/25/2011  *RADIOLOGY REPORT*  Clinical Data:  High blood pressure.  Diabetes.  Hyperlipidemia. Presenting with transient episode of left-sided weakness and right gaze preference.  MRI HEAD WITHOUT CONTRAST MRA HEAD WITHOUT CONTRAST  Technique: Multiplanar, multiecho pulse sequences of the brain and surrounding structures were obtained according to standard protocol without intravenous contrast.  Angiographic images of the head were obtained using MRA technique without contrast.  Comparison: 07/24/2011 CT.  No comparison MR.  MRI HEAD  Findings:  No acute infarct.  No intracranial hemorrhage.  Mild small vessel disease type changes.  No intracranial mass lesion detected on this unenhanced exam.  No hydrocephalus.  Opacification mastoid air cells and middle ear cavities more notable on the right. No obstructing lesions seen in the region of posterior-superior nasopharynx.  Minimal paranasal sinus mucosal thickening.  Mild narrowing upper cervical canal.  Mild exophthalmos.  Major intracranial vascular structures are patent.  IMPRESSION: No acute infarct.  Mild small vessel disease type changes.  Opacification mastoid air cells and middle ear cavities more notable on the right.  MRA HEAD  Findings: Mild to  moderate narrowing involving portions of the right internal carotid artery cavernous segment and mild narrowing and irregularity of the portions of the left internal carotid artery cavernous segment.  Small bulge M1 segment left middle cerebral artery.  This appears to be origin of a vessel. Tiny aneurysm not entirely excluded. This is at the limitation of the present exam.  Middle cerebral artery branch vessel irregularity.  Left vertebral artery is dominant.  Mild irregularity right vertebral artery.  Mild irregularity of the basilar artery without high-grade stenosis.  Nonvisualization PICAs.  Fusiform dilated the P1 segment of the left posterior cerebral artery.  Mild narrowing P1 segment right posterior cerebral artery. Moderate to marked stenosis P1-2 segment right posterior cerebral artery.  Posterior cerebral artery distal branch vessel irregularity greater on the right.  IMPRESSION: Intracranial atherosclerotic type changes as detailed above.  Minimal bulge M1 segment left middle cerebral artery may represent origin of a vessel.  Tiny aneurysm not entirely excluded however this is at the limitation of the present exam.  Original Report Authenticated By: Fuller Canada, M.D.   Dg Chest Port 1 View  07/25/2011  *RADIOLOGY REPORT*  Clinical Data: Congestive heart failure, improving  shortness of breath  PORTABLE CHEST - 1 VIEW  Comparison: Portable chest x-ray of 07/24/2011  Findings: There may be slight improvement in the degree of moderate pulmonary vascular congestion.  Cardiomegaly, bilateral effusions, and mild congestion remain.  Median sternotomy sutures are noted.  IMPRESSION: Slight improvement in congestive heart failure pattern.  Bilateral pleural effusions remain.  Original Report Authenticated By: Juline Patch, M.D.   Dg Chest Port 1 View  07/24/2011  *RADIOLOGY REPORT*  Clinical Data: Shortness of breath.  PORTABLE CHEST - 1 VIEW  Comparison: 06/29/2011  Findings: Worsening interstitial  prominence throughout the lungs, likely interstitial edema.  Mild cardiomegaly.  Small to moderate bilateral pleural effusions, right greater than left, slightly increased since prior study.  Bibasilar atelectasis.  Prior CABG.  IMPRESSION: Worsening interstitial prominence, likely interstitial edema/CHF. Enlarging bilateral effusions.  Original Report Authenticated By: Cyndie Chime, M.D.     1. Heart failure   2. Respiratory failure   3. Hypoxia   4. NSTEMI (non-ST elevated myocardial infarction)   5. Carotid stenosis       MDM  The patient appears reasonably stabilized for admission considering the current resources, flow, and capabilities available in the ED at this time, and I doubt any other [facility] requiring further screening and/or treatment in the ED prior to admission.        Hurman Horn, MD 07/25/11 [phone]

## 2011-07-24 NOTE — Consult Note (Signed)
Referring Physician: Fonnie Jarvis    Chief Complaint: Eye deviation  HPI: [name] is an 61 y.o. female who was in the hyperbaric chamber today and became short of breath.  Was pulled out and felt to have left sided weakness and right gaze preference.  Oxygen saturation was in the 60's at that time.  Patient was diaphoretic as well and had some chest pain. Patient was brought in as a code stroke.  By the time of presentation to the ED the patient was moving all extremities but not following commands.  Eye deviation had resolved.  With improved oxygenation was following commands as well.    LSN: [id number] tPA Given: No: Not felt to be a stroke  Past Medical History  Diagnosis Date  . Coronary atherosclerosis of native coronary artery     BMS circ 2001, DES LAD 2005, DES LAD and RCA 2008, subsequent CABG  . Drug allergy     Allergy to Plavix (hives) - took Ticlid in past  . Essential hypertension, benign   . Mixed hyperlipidemia   . Type 2 diabetes mellitus   . Myocardial infarction ~ 2002  . Arthritis     rheumatoid  . Depression   . Peripheral neuropathy   . Peptic ulcer disease ~ 1975  . Nephrolithiasis 02/07/2011  . Angina   . Breast cancer     Chemo and mastectomy- right  . Diabetes mellitus   . Chronic systolic heart failure     LVEF 40-45%  . Anxiety   . Sternal wound infection     Past Surgical History  Procedure Date  . Mastectomy 1988    right  . Cholecystectomy 1969  . Abdominal hysterectomy 1989  . Tonsillectomy and adenoidectomy 1979  . Appendectomy 1969  . Tubal ligation 1972  . Coronary artery bypass graft 01/11/2011    Procedure: CORONARY ARTERY BYPASS GRAFTING (CABG);  Surgeon: Kathlee Nations Suann Larry, MD;  Location: [facility] OR;  Service: Open Heart Surgery;  Laterality: N/A;  cabg x four, using left internal mammary artery and right leg greater saphenous vein harvestede endoscopically  . Sternal wound debridement 03/01/2011    Procedure: STERNAL WOUND DEBRIDEMENT;   Surgeon: Kathlee Nations Suann Larry, MD;  Location: [facility] OR;  Service: Open Heart Surgery;  Laterality: N/A;  . Application of wound vac 03/01/2011    Procedure: APPLICATION OF WOUND VAC;  Surgeon: Kathlee Nations Suann Larry, MD;  Location: [facility] OR;  Service: Open Heart Surgery;  Laterality: N/A;  . Breast surgery   . Sternal wires removal 04/02/2011    Procedure: STERNAL WIRES REMOVAL;  Surgeon: Kathlee Nations Suann Larry, MD;  Location: [facility] OR;  Service: Open Heart Surgery;  Laterality: N/A;    Family History  Problem Relation Age of Onset  . Anesthesia problems Neg Hx    Social History:  reports that she quit smoking about 4 years ago. Her smoking use included Cigarettes. She has a 20 pack-year smoking history. She has never used smokeless tobacco. She reports that she does not drink alcohol or use illicit drugs.  Allergies:  Allergies  Allergen Reactions  . Clopidogrel Bisulfate Hives  . Codeine Nausea And Vomiting  . Morphine Nausea And Vomiting    Medications: I have reviewed the patient's current medications. Prior to Admission:  ASA, Coreg, Neurontin, Lantus, Humalog, MVI, Paxil, Zocor, Ultram  ROS: History obtained from husband  General ROS: diaphoretic Psychological ROS: negative for - behavioral disorder, hallucinations, memory difficulties, mood swings or suicidal ideation Ophthalmic  ROS: negative for - blurry vision, double vision, eye pain or loss of vision ENT ROS: negative for - epistaxis, nasal discharge, oral lesions, sore throat, tinnitus or vertigo Allergy and Immunology ROS: negative for - hives or itchy/watery eyes Hematological and Lymphatic ROS: negative for - bleeding problems, bruising or swollen lymph nodes Endocrine ROS: negative for - galactorrhea, hair pattern changes, polydipsia/polyuria or temperature intolerance Respiratory ROS:  shortness of breath for the past few days Cardiovascular ROS: poorly healing chest wound Gastrointestinal ROS: negative for - abdominal pain,  diarrhea, hematemesis, nausea/vomiting or stool incontinence Genito-Urinary ROS: negative for - dysuria, hematuria, incontinence or urinary frequency/urgency Musculoskeletal ROS: negative for - joint swelling or muscular weakness Neurological ROS: as noted in HPI Dermatological ROS: negative for rash and skin lesion changes  Physical Examination: Last menstrual period 01/05/1996.  Neurologic Examination: Mental Status: Alert but putting all of her efforts into breathing.  Unable to verbally respond to questions.  Follows some simple commands.  Cranial Nerves: II: Does not blink to bilateral confrontation but does seem to look at you when her name is called.   III,IV, VI: ptosis not present, extra-ocular motions intact bilaterally V,VII: corneals intact bilaterally, no facial droop seen VIII: hearing normal bilaterally IX,X: not tested XI: not tested XII: not tested Motor: Moves all extremities weakly but against gravity.   Sensory: Grossly intact bilaterally Deep Tendon Reflexes: 1+ in the upper extremities and absent in the lower extremities Plantars: Right: mute   Left: mute Cerebellar: Unable to perform  Results for orders placed during the hospital encounter of 07/24/11 (from the past 48 hour(s))  PROTIME-INR     Status: Normal   Collection Time   07/24/11  9:44 AM      Component Value Range Comment   Prothrombin Time 15.2  11.6 - 15.2 (seconds)    INR 1.18  0.00 - 1.49    APTT     Status: Normal   Collection Time   07/24/11  9:44 AM      Component Value Range Comment   aPTT 35  24 - 37 (seconds)   CBC     Status: Abnormal   Collection Time   07/24/11  9:44 AM      Component Value Range Comment   WBC 17.5 (*) 4.0 - 10.5 (K/uL)    RBC 4.30  3.87 - 5.11 (MIL/uL)    Hemoglobin 11.9 (*) 12.0 - 15.0 (g/dL)    HCT 81.1  91.4 - 78.2 (%)    MCV 85.3  78.0 - 100.0 (fL)    MCH 27.7  26.0 - 34.0 (pg)    MCHC 32.4  30.0 - 36.0 (g/dL)    RDW 95.6  21.3 - 08.6 (%)    Platelets  330  150 - 400 (K/uL)   DIFFERENTIAL     Status: Normal (Preliminary result)   Collection Time   07/24/11  9:44 AM      Component Value Range Comment   Neutrophils Relative PENDING  43 - 77 (%)    Neutro Abs PENDING  1.7 - 7.7 (K/uL)    Band Neutrophils PENDING  0 - 10 (%)    Lymphocytes Relative PENDING  12 - 46 (%)    Lymphs Abs PENDING  0.7 - 4.0 (K/uL)    Monocytes Relative PENDING  3 - 12 (%)    Monocytes Absolute PENDING  0.1 - 1.0 (K/uL)    Eosinophils Relative PENDING  0 - 5 (%)    Eosinophils Absolute  PENDING  0.0 - 0.7 (K/uL)    Basophils Relative PENDING  0 - 1 (%)    Basophils Absolute PENDING  0.0 - 0.1 (K/uL)    WBC Morphology PENDING      RBC Morphology PENDING      Smear Review PENDING      nRBC PENDING  0 (/100 WBC)    Metamyelocytes Relative PENDING      Myelocytes PENDING      Promyelocytes Absolute PENDING      Blasts PENDING     COMPREHENSIVE METABOLIC PANEL     Status: Abnormal   Collection Time   07/24/11  9:44 AM      Component Value Range Comment   Sodium 137  135 - 145 (mEq/L)    Potassium 5.9 (*) 3.5 - 5.1 (mEq/L)    Chloride 101  96 - 112 (mEq/L)    CO2 24  19 - 32 (mEq/L)    Glucose, Bld 261 (*) 70 - 99 (mg/dL)    BUN 21  6 - 23 (mg/dL)    Creatinine, Ser 1.61 (*) 0.50 - 1.10 (mg/dL)    Calcium 9.2  8.4 - 10.5 (mg/dL)    Total Protein 8.7 (*) 6.0 - 8.3 (g/dL)    Albumin 3.5  3.5 - 5.2 (g/dL)    AST 29  0 - 37 (U/L)    ALT 20  0 - 35 (U/L)    Alkaline Phosphatase 102  39 - 117 (U/L)    Total Bilirubin 0.2 (*) 0.3 - 1.2 (mg/dL)    GFR calc non Af Amer 44 (*) >90 (mL/min)    GFR calc Af Amer 51 (*) >90 (mL/min)   CK TOTAL AND CKMB     Status: Abnormal (Preliminary result)   Collection Time   07/24/11  9:44 AM      Component Value Range Comment   Total CK PENDING  7 - 177 (U/L)    CK, MB 6.0 (*) 0.3 - 4.0 (ng/mL)    Relative Index PENDING  0.0 - 2.5    TROPONIN I     Status: Abnormal   Collection Time   07/24/11  9:44 AM      Component Value  Range Comment   Troponin I 1.31 (*) <0.30 (ng/mL)   POCT I-STAT TROPONIN I     Status: Abnormal   Collection Time   07/24/11  9:58 AM      Component Value Range Comment   Troponin i, poc 1.02 (*) 0.00 - 0.08 (ng/mL)    Comment NOTIFIED PHYSICIAN      Comment 3            POCT I-STAT, CHEM 8     Status: Abnormal   Collection Time   07/24/11 10:04 AM      Component Value Range Comment   Sodium 140  135 - 145 (mEq/L)    Potassium 6.0 (*) 3.5 - 5.1 (mEq/L)    Chloride 108  96 - 112 (mEq/L)    BUN 25 (*) 6 - 23 (mg/dL)    Creatinine, Ser 0.96 (*) 0.50 - 1.10 (mg/dL)    Glucose, Bld 045 (*) 70 - 99 (mg/dL)    Calcium, Ion 4.09  1.12 - 1.32 (mmol/L)    TCO2 27  0 - 100 (mmol/L)    Hemoglobin 13.6  12.0 - 15.0 (g/dL)    HCT 81.1  91.4 - 78.2 (%)   GLUCOSE, CAPILLARY     Status: Abnormal   Collection  Time   07/24/11 10:10 AM      Component Value Range Comment   Glucose-Capillary 262 (*) 70 - 99 (mg/dL)    Ct Head Wo Contrast  07/24/2011  *RADIOLOGY REPORT*  Clinical Data: Patient unresponsive with right gaze preference.  CT HEAD WITHOUT CONTRAST  Technique:  Contiguous axial images were obtained from the base of the skull through the vertex without contrast.  Comparison: None.  Findings: The brain demonstrates no evidence of hemorrhage, infarction, edema, mass effect, extra-axial fluid collection, hydrocephalus or mass lesion.  The skull is unremarkable.  IMPRESSION: No acute findings by CT of the brain.  Findings were communicated directly to Dr. Fonnie Jarvis.  Original Report Authenticated By: Reola Calkins, M.D.   Dg Chest Port 1 View  07/24/2011  *RADIOLOGY REPORT*  Clinical Data: Shortness of breath.  PORTABLE CHEST - 1 VIEW  Comparison: 06/29/2011  Findings: Worsening interstitial prominence throughout the lungs, likely interstitial edema.  Mild cardiomegaly.  Small to moderate bilateral pleural effusions, right greater than left, slightly increased since prior study.  Bibasilar atelectasis.   Prior CABG.  IMPRESSION: Worsening interstitial prominence, likely interstitial edema/CHF. Enlarging bilateral effusions.  Original Report Authenticated By: Cyndie Chime, M.D.    Assessment: 61 y.o. female presenting with shortness of breath and initial neurologic findings of left sided weakness and right eye deviation.  All of these symptoms seemed to resolve.  Potassium elevated.  Troponins elevated as well.  Suspect cardiac etiology for presentation.  Patient not given tPA.  CT unremarkable.  No further neurologic intervention at this time.    Stroke Risk Factors - diabetes mellitus, hyperlipidemia, hypertension and CAD  Plan: No further neurologic intervention is recommended at this time.  If further questions arise, please call or page at that time.  Thank you for allowing neurology to participate in the care of this patient.  Thana Farr, MD [facility] [phone] 07/24/2011  11:19 AM

## 2011-07-24 NOTE — ED Notes (Signed)
Pt called a code stroke in the fireld by ems was at hyperbaric chamber and became sob and pulled out, pt with upward right side gaze and left side wekaness, on arrival to ed pt is moving all limbs and purposeful but weak, garbeled speech and pt is not appropriate. Pt diaphoretic and clammy.

## 2011-07-25 ENCOUNTER — Ambulatory Visit: Payer: 59 | Admitting: Cardiothoracic Surgery

## 2011-07-25 ENCOUNTER — Inpatient Hospital Stay (HOSPITAL_COMMUNITY): Payer: 59

## 2011-07-25 DIAGNOSIS — I214 Non-ST elevation (NSTEMI) myocardial infarction: Secondary | ICD-10-CM | POA: Diagnosis present

## 2011-07-25 DIAGNOSIS — R404 Transient alteration of awareness: Secondary | ICD-10-CM

## 2011-07-25 DIAGNOSIS — J9600 Acute respiratory failure, unspecified whether with hypoxia or hypercapnia: Secondary | ICD-10-CM | POA: Diagnosis present

## 2011-07-25 DIAGNOSIS — R4182 Altered mental status, unspecified: Secondary | ICD-10-CM | POA: Diagnosis present

## 2011-07-25 DIAGNOSIS — G459 Transient cerebral ischemic attack, unspecified: Secondary | ICD-10-CM

## 2011-07-25 DIAGNOSIS — E875 Hyperkalemia: Secondary | ICD-10-CM | POA: Diagnosis present

## 2011-07-25 DIAGNOSIS — I509 Heart failure, unspecified: Secondary | ICD-10-CM

## 2011-07-25 LAB — GLUCOSE, CAPILLARY
Glucose-Capillary: 262 mg/dL — ABNORMAL HIGH (ref 70–99)
Glucose-Capillary: 213 mg/dL — ABNORMAL HIGH (ref 70–99)

## 2011-07-25 LAB — CBC
HCT: 31.6 % — ABNORMAL LOW (ref 36.0–46.0)
Hemoglobin: 10.3 g/dL — ABNORMAL LOW (ref 12.0–15.0)
MCH: 27.1 pg (ref 26.0–34.0)
MCHC: 32.5 g/dL (ref 30.0–36.0)
MCV: 84.3 fL (ref 78.0–100.0)
Platelets: 134 10*3/uL — ABNORMAL LOW (ref 150–400)
RBC: 3.25 MIL/uL — ABNORMAL LOW (ref 3.87–5.11)
RDW: 14.9 % (ref 11.5–15.5)
WBC: 11.4 10*3/uL — ABNORMAL HIGH (ref 4.0–10.5)

## 2011-07-25 LAB — CARDIAC PANEL(CRET KIN+CKTOT+MB+TROPI)
CK, MB: 8.2 ng/mL (ref 0.3–4.0)
Total CK: 123 U/L (ref 7–177)
Troponin I: 1.53 ng/mL (ref <0.30)

## 2011-07-25 LAB — BASIC METABOLIC PANEL
BUN: 35 mg/dL — ABNORMAL HIGH (ref 6–23)
GFR calc non Af Amer: 52 mL/min — ABNORMAL LOW (ref >90)
Glucose, Bld: 266 mg/dL — ABNORMAL HIGH (ref 70–99)
Potassium: 5.1 mEq/L (ref 3.5–5.1)

## 2011-07-25 LAB — URINE CULTURE: Culture: NO GROWTH

## 2011-07-25 MED ORDER — FUROSEMIDE 10 MG/ML IJ SOLN
20.0000 mg | Freq: Once | INTRAMUSCULAR | Status: AC
  Administered 2011-07-25: 20 mg via INTRAVENOUS
  Filled 2011-07-25: qty 2

## 2011-07-25 MED ORDER — PRAVASTATIN SODIUM 40 MG PO TABS
40.0000 mg | ORAL_TABLET | Freq: Every day | ORAL | Status: DC
  Administered 2011-07-25 – 2011-08-03 (×9): 40 mg via ORAL
  Filled 2011-07-25 (×10): qty 1

## 2011-07-25 MED ORDER — CARVEDILOL 3.125 MG PO TABS
3.1250 mg | ORAL_TABLET | Freq: Two times a day (BID) | ORAL | Status: DC
  Administered 2011-07-25 – 2011-07-27 (×4): 3.125 mg via ORAL
  Filled 2011-07-25 (×8): qty 1

## 2011-07-25 MED ORDER — ALPRAZOLAM 0.25 MG PO TABS
0.2500 mg | ORAL_TABLET | Freq: Three times a day (TID) | ORAL | Status: DC | PRN
  Administered 2011-07-25: 0.25 mg via ORAL
  Filled 2011-07-25: qty 1

## 2011-07-25 NOTE — Progress Notes (Signed)
ANTICOAGULATION CONSULT NOTE - Follow Up Consult  Pharmacy Consult for heparin Indication: NSTEMI  Labs:  Basename 07/25/11 0105 07/24/11 1726 07/24/11 1004 07/24/11 0944  HGB -- -- 13.6 11.9*  HCT -- -- 40.0 36.7  PLT -- -- -- 330  APTT -- -- -- 35  LABPROT -- -- -- 15.2  INR -- -- -- 1.18  HEPARINUNFRC 0.22* -- -- --  CREATININE -- -- 1.30* 1.29*  CKTOTAL -- 137 -- 143  CKMB -- 8.3* -- 6.0*  TROPONINI -- 2.25* -- 1.31*    Assessment: 60yo female subtherapeutic on heparin with initial dosing for NSTEMI.  Goal of Therapy:  Heparin level 0.3-0.7 units/ml   Plan:  Will increase heparin gtt by 2 units/kg/hr to 1150 units/hr and check level in 6hr.  Colleen Can PharmD BCPS 07/25/2011,1:42 AM

## 2011-07-25 NOTE — Progress Notes (Signed)
VASCULAR LAB PRELIMINARY  PRELIMINARY  PRELIMINARY  PRELIMINARY  Carotid duplex  completed.    Preliminary report:  Right:  40-59% internal carotid artery stenosis.  Left:  60-79% internal carotid artery stenosis.  Bilateral:  Vertebral artery flow is antegrade.    Terance Hart, RVT 07/25/2011, 1:09 PM

## 2011-07-25 NOTE — Progress Notes (Signed)
07/25/2011 9:58 PM CBG 43   Alert and oriented without c/o.  Given milk and snack.  Will recheck per protocol. [name], Linnell Fulling

## 2011-07-25 NOTE — Progress Notes (Signed)
History: [name] is a 61 y.o. female who developed acute respiratory distress and became unresponsive during hyperbaric chamber treatment 07/24/11.  Was not following commands and had right gaze preference. Oxygen saturation was in the 60's at that time. Patient was diaphoretic with increased work of breathing.   Code stroke called. By the time of presentation to the ED the patient was lethargic, eye deviation had resolved. Initial NIHSS was 17.  Code stroke cancelled as pt began to clear.  Subjective: Patient feeling a little depressed this am. Does not recall having a Head CT yesterday.  Presently, no extremity weakness,visual disturbance, headache, confusion or difficulty swallowing.  At baseline.  No history of seizure or stroke per patient.  Husband at bedside.  Objective: BP 92/35  Pulse 80  Temp(Src) 97.7 F (36.5 C) (Oral)  Resp 23  Ht 5' 4.96" (1.65 m)  Wt 76.4 kg (168 lb 6.9 oz)  BMI 28.06 kg/m2  SpO2 98%  LMP 01/05/1996  CBGs  Basename 07/25/11 0738 07/24/11 2110 07/24/11 1010  GLUCAP 262* 355* 262*   Medications: Scheduled:   . albuterol  5 mg Nebulization Once  . aspirin  325 mg Oral Daily  . carvedilol  3.125 mg Oral BID  . clarithromycin  500 mg Oral BID WC  . furosemide  40 mg Intravenous Once  . gabapentin  300 mg Oral TID  . heparin  4,000 Units Intravenous Once  . insulin aspart  0-9 Units Subcutaneous TID WC  . insulin aspart  20 Units Subcutaneous TID WC  . insulin glargine  35 Units Subcutaneous BID  . ipratropium  0.5 mg Nebulization Once  . methylPREDNISolone (SOLU-MEDROL) injection  125 mg Intravenous Once  . mulitivitamin with minerals  1 tablet Oral Daily  . ondansetron (ZOFRAN) IV  4 mg Intravenous Once  . PARoxetine  20 mg Oral q morning - 10a  . simvastatin  20 mg Oral QHS  . sodium chloride  3 mL Intravenous Q12H   Neurologic Exam: Mental Status: Alert, oriented, thought content appropriate.  Speech fluent without evidence of  aphasia. Able to follow 3 step commands without difficulty. Naming, recall intact. Cranial Nerves: II- Visual fields grossly intact. III/IV/VI-Extraocular movements intact.  Pupils reactive bilaterally., right pupil 4mm, left 3 mm. V/VII-Smile symmetric VIII-hearing grossly intact IX/X-normal gag XI-bilateral shoulder shrug XII-midline tongue extension Motor: 5/5 bilaterally with normal tone and bulk Sensory: Light touch intact throughout, bilaterally Deep Tendon Reflexes: 2+ and symmetric throughout Plantars: Downgoing bilaterally Cerebellar: Normal finger-to-nose, normal rapid alternating movements and normal heel-to-shin test.   Lab Results: CBC:  Lab 07/25/11 0557 07/24/11 1004 07/24/11 0944  WBC 7.6 -- 17.5*  NEUTROABS -- -- 8.9*  HGB 8.8* 13.6 --  HCT 27.1* 40.0 --  MCV 83.4 -- 85.3  PLT 134* -- 330   Basic Metabolic Panel:  Lab 07/25/11 1478 07/24/11 1725 07/24/11 1004 07/24/11 0944  NA 134* -- 140 --  K 5.1 5.6* -- --  CL 100 -- 108 --  CO2 20 -- -- 24  GLUCOSE 266* -- 272* --  BUN 35* -- 25* --  CREATININE 1.11* -- 1.30* --  CALCIUM 8.6 -- -- 9.2  MG -- -- -- --  PHOS -- -- -- --   Liver Function Tests:  Lab 07/24/11 0944  AST 29  ALT 20  ALKPHOS 102  BILITOT 0.2*  PROT 8.7*  ALBUMIN 3.5   Coagulation:  Lab 07/24/11 0944  LABPROT 15.2  INR 1.18    Study  Results:  07/24/2011  CT HEAD WITHOUT CONTRAST  Technique:  Contiguous axial images were obtained from the base of the skull through the vertex without contrast.  Comparison: None.  Findings: The brain demonstrates no evidence of hemorrhage, infarction, edema, mass effect, extra-axial fluid collection, hydrocephalus or mass lesion.  The skull is unremarkable.  IMPRESSION: No acute findings by CT of the brain.  Reola Calkins, M.D.   07/24/2011  PORTABLE CHEST - 1 VIEW  Findings: Worsening interstitial prominence throughout the lungs, likely interstitial edema.  Mild cardiomegaly.  Small to  moderate bilateral pleural effusions, right greater than left, slightly increased since prior study.  Bibasilar atelectasis.  Prior CABG.  IMPRESSION: Worsening interstitial prominence, likely interstitial edema/CHF. Enlarging bilateral effusions.  Cyndie Chime, M.D.   03/01/11 2D ECHO:  - Left ventricle: The cavity size was normal. Wall thickness was normal. Systolic function was mildly to moderately reduced. The estimated ejection fraction was in the range of 40% to 45%. Diffuse hypokinesis. There is severe hypokinesis of the apical myocardium. Doppler parameters are consistent with abnormal left ventricular relaxation (grade 1 diastolic dysfunction). - Left atrium: The atrium was mildly dilated. - Right atrium: The atrium was mildly dilated. - Pulmonary arteries: Systolic pressure was mildly increased. PA peak pressure: 31mm Hg (S). - Pericardium, extracardiac: A trivial pericardial effusion was identified.  01/08/11 Carotid Dopplers:  - There is a Right ICA 60 to79% low end of scale stenosis by velocity. There is a Left ICA60 to 79% low to mid end of scale stenosis byvelocity. Vertebrals are patent with antegrade flow.  Assessment: 61 y.o. female presenting with ARD, AMS and right gaze preference.  Sx resolved shortly after arrival to ER.   Ruled in for NSTMI.  Code stoke cancelled.  Patient not given tPA. Head CT unremarkable. Had hyperkalemia, hyperglycemia, anemia,  renal insufficiency, elevated WBC and hypotension on arrival.  While TIA cannot be completely excluded, cardiogenic etiology most likely.  Suspect pt had transient cerebral hypoperfusion related to hypotension and ARD.  No focality on exam presently.    Stroke risk factors:  CAD, DM, HTN, Hyperlipidemia, PVD.  Plan: 1. MRI/MRA brain 2. FLP/A1C 3. Repeat carotid dopplers 4.  Agree with ASA, and aggressive risk factor modification.  Discussed case with Dr. Roseanne Reno.   LOS: 1 day   Jana Hakim [facility] [phone] 07/25/2011  8:46 AM

## 2011-07-25 NOTE — Progress Notes (Signed)
[facility]  NAME:  Debrosse, MONTRICE                   ACCOUNT NO.:  MEDICAL RECORD NO.:  [id number]      DATE OF BIRTH:  1950/12/09  PHYSICIAN:  Joanne Gavel, M.D.         VISIT DATE:  07/24/2011                                  OFFICE VISIT   Mrs. Savoia was complaining of being very hot and short of breath.  She was taken out of the hyperbaric chamber.  She was awake and alert, but extremely short of breath.  She needed to sit up.  At that point, her O2 sat was found to be 68.  She was started on oxygen by mask, pulses 104, blood pressure 150+, temperature is 96.8.  Blood glucose was 183.  Her O2 sat increased to 90.  Her pulse remained high at 220.  The heart regular, breath sounds were rather distant but present.  Heart sounds are also distant.  The EMS came, the patient at this point, was extremely lethargic and it did not respond well to questions.  She did not seem to have any lateralizing signs, but EMS suggested that the patient may be a stroke in progress.  She was taken to the emergency room at that point for workup.  Pulse, blood pressure, and O2 sats were stable, but her condition was questionable, and she was definitely in need of emergency care.     Joanne Gavel, M.D.     RA/MEDQ  D:  07/24/2011  T:  07/24/2011  Job:  [id number]

## 2011-07-25 NOTE — Progress Notes (Signed)
ANTICOAGULATION CONSULT NOTE - Follow Up Consult  Pharmacy Consult for heparin Indication: NSTEMI  Labs:  Basename 07/25/11 0843 07/25/11 0557 07/25/11 0105 07/24/11 1726 07/24/11 1004 07/24/11 0944  HGB -- 8.8* -- -- 13.6 --  HCT -- 27.1* -- -- 40.0 36.7  PLT -- 134* -- -- -- 330  APTT -- -- -- -- -- 35  LABPROT -- -- -- -- -- 15.2  INR -- -- -- -- -- 1.18  HEPARINUNFRC 0.28* -- 0.22* -- -- --  CREATININE -- 1.11* -- -- 1.30* 1.29*  CKTOTAL 102 -- 123 137 -- --  CKMB 7.4* -- 8.2* 8.3* -- --  TROPONINI 1.27* -- 1.53* 2.25* -- --   Admit Complaint: 61 y.o.  female with CAD, CABG 11/12 admitted 07/24/2011 after her first hyperbaric oxygen therapy after coming out unresponsive and diaphoretic.  Pharmacy consulted to dose heparin for ACS  Overnight Events: 07/25/2011 Code stroke on admission canceled AMS attributed to hypoxia, feeling better this am.  Assessment: Anticoagulation: NSTEMI: heparin at 1150/hr, no bleeding no infusion problems. Heparin level just below goal, large CBC drop, hematuria, follow closely. 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 Antibiotics: 5/29 Clarithromycin Cardiovascular: CAD, HF (EF 40-45%), HTN: ASA, carvedilol, simvastatin: BP and HR at goal. [id number]/[id number] (-437), not fluid overloaded on exam Endocrinology: DM: SSI, lantus, on home regimen glucose >300. Gastrointestinal / Nutrition: PUD Neurology: Depression, anxiety, periph neuropathy: gabapentin, Paroxetine Nephrology: Neuro workup in progress Pulmonary: 98% RA Hematology / Oncology: history of Breast CA PTA Medication Issues: All Home Meds Ordered:  Best Practices: DVT Prophylaxis:  Full dose heparin  Goal of Therapy:  Heparin level 0.3-0.7 units/ml   Plan:  Will increase heparin to 1300 units/hr and check level in 6hr. Given decline in H/H will recheck CBC at time of next heparin level.  --- In light of ongoing cardiac workup and drug interaction with  simvastatin consider alternative antibiotic ----   Thank you for allowing pharmacy to be a part of this patients care team.  Lovenia Kim Pharm.D., BCPS Clinical Pharmacist 07/25/2011 10:55 AM Pager: [phone] Phone: [phone]

## 2011-07-25 NOTE — Progress Notes (Signed)
[facility] Resident Note  Subjective:  Feeling better this morning.  Denies chest pain, nausea, vomiting, or shortness of breath.  She states she slept well overnight.  Denies swelling.  She states that up until yesterday she was feeling well with only a little shortness of breath on exertion over the last few weeks but no increased swelling or weight gain.    Objective:  Vital Signs in the last 24 hours: Filed Vitals:   07/25/11 0300 07/25/11 0400 07/25/11 0500 07/25/11 0600  BP: 99/51 97/54 96/49  98/50  Pulse: 76 74 78 83  Temp:  97.8 F (36.6 C)    TempSrc:  Oral    Resp: 15 16 18 16   Height:      Weight:   168 lb 6.9 oz (76.4 kg)   SpO2: 100% 99% 100% 98%   Intake/Output from previous day: 05/28 0701 - 05/29 0700 In: 966 [P.O.:720; I.V.:246] Out: 1425 [Urine:1425]  24-hour weight change: Weight change:   Weight trends: Filed Weights   07/24/11 1711 07/25/11 0500  Weight: 162 lb 14.7 oz (73.9 kg) 168 lb 6.9 oz (76.4 kg)   Physical Exam: Vitals reviewed. General: resting in bed, NAD HEENT: PERRL, EOMI, no scleral icterus Cardiac: Sternotomy scar noted in the midline with bandage over the lower half of the wound.  No drainage noted.  RRR, no rubs, murmurs or gallops Pulm: clear to auscultation bilaterally, no wheezes, rales, or rhonchi Abd: soft, nontender, nondistended, BS present Ext: warm and well perfused, no pedal edema Neuro: alert and oriented X3, cranial nerves II-XII grossly intact, strength and sensation to light touch equal in bilateral upper and lower extremities  Lab Results:  Basename 07/24/11 1004 07/24/11 0944  WBC -- 17.5*  HGB 13.6 11.9*  PLT -- 330    Basename 07/24/11 1725 07/24/11 1620 07/24/11 1004 07/24/11 0944  NA -- -- 140 137  K 5.6* 6.4* -- --  CL -- -- 108 101  CO2 -- -- -- 24  GLUCOSE -- -- 272* 261*  BUN -- -- 25* 21  CREATININE -- -- 1.30* 1.29*    Basename 07/25/11 0105 07/24/11 1726  TROPONINI 1.53*  2.25*    Basename 07/24/11 2110 07/24/11 1010  GLUCAP 355* 262*    Basename 07/24/11 0953  PROBNP 3723.0*   2D echo:  Study Conclusions  - Left ventricle: The cavity size was normal. Wall thickness was normal. Systolic function was mildly to moderately reduced. The estimated ejection fraction was in the range of 40% to 45%. Diffuse hypokinesis. There is severe hypokinesis of the apical myocardium. Doppler parameters are consistent with abnormal left ventricular relaxation (grade 1 diastolic dysfunction). - Left atrium: The atrium was mildly dilated. - Right atrium: The atrium was mildly dilated. - Pulmonary arteries: Systolic pressure was mildly increased. PA peak pressure: 31mm Hg (S). - Pericardium, extracardiac: A trivial pericardial effusion was identified.  Tele: NSR rates in the 70s-80s.  Scheduled Meds:   . albuterol  5 mg Nebulization Once  . aspirin  325 mg Oral Daily  . clarithromycin  500 mg Oral BID WC  . furosemide  40 mg Intravenous Once  . gabapentin  300 mg Oral TID  . heparin  4,000 Units Intravenous Once  . insulin aspart  0-9 Units Subcutaneous TID WC  . insulin aspart  20 Units Subcutaneous TID WC  . insulin glargine  35 Units Subcutaneous BID  . ipratropium  0.5 mg Nebulization Once  . methylPREDNISolone (SOLU-MEDROL) injection  125 mg Intravenous Once  . mulitivitamin with minerals  1 tablet Oral Daily  . ondansetron (ZOFRAN) IV  4 mg Intravenous Once  . PARoxetine  20 mg Oral q morning - 10a  . simvastatin  20 mg Oral QHS  . sodium chloride  3 mL Intravenous Q12H   Continuous Infusions:   . heparin 1,150 Units/hr (07/25/11 0200)  . DISCONTD: sodium chloride Stopped (07/24/11 1644)   PRN Meds:.sodium chloride, acetaminophen, ondansetron (ZOFRAN) IV, sodium chloride, traMADol  Imaging: Ct Head Wo Contrast  07/24/2011  *RADIOLOGY REPORT*  Clinical Data: Patient unresponsive with right gaze preference.  CT HEAD WITHOUT CONTRAST  Technique:   Contiguous axial images were obtained from the base of the skull through the vertex without contrast.  Comparison: None.  Findings: The brain demonstrates no evidence of hemorrhage, infarction, edema, mass effect, extra-axial fluid collection, hydrocephalus or mass lesion.  The skull is unremarkable.  IMPRESSION: No acute findings by CT of the brain.  Findings were communicated directly to Dr. Fonnie Jarvis.  Original Report Authenticated By: Reola Calkins, M.D.   Dg Chest Port 1 View  07/24/2011  *RADIOLOGY REPORT*  Clinical Data: Shortness of breath.  PORTABLE CHEST - 1 VIEW  Comparison: 06/29/2011  Findings: Worsening interstitial prominence throughout the lungs, likely interstitial edema.  Mild cardiomegaly.  Small to moderate bilateral pleural effusions, right greater than left, slightly increased since prior study.  Bibasilar atelectasis.  Prior CABG.  IMPRESSION: Worsening interstitial prominence, likely interstitial edema/CHF. Enlarging bilateral effusions.  Original Report Authenticated By: Cyndie Chime, M.D.   Assessment/Plan:   Pt is a 61 y.o. yo female with a PMHx of CAD s/p CABG, sternal osteomyelitis, breast cancer, and chronic systolic heart failure who was admitted on 07/24/2011 for and episode of acute respiratory failure during a hyperbaric oxygen therapy session.   1. Acute respiratory failure: Likely secondary to her acute on chronic systolic heart failure exacerbation.  She was taken off the BIPAP last night and has been maintaining well on [place] overnight.  She states that she feels much better today and has no more shortness of breath.  Chest x-ray was significant for some increase edema but no other definite infiltrate.    2. Acute on chronic systolic heart failure exacerbation:  Likely precipitated by the hyperbaric oxygen therapy.  She states that she had been feeling mildly more short of breath over the last few weeks after starting a course of bactrim for her sternal wound but had  no weight gain or increase in swelling.  She is negative about .5L since yesterday and labs from this morning are still pending.  If her creatinine is about her baseline we will continue the lasix today IV if not we will transition her to her home PO lasix dose and continue to follow.  She does not appear fluid overloaded on exam today.    3. NSTEMI: Troponins were positive on admission and rose to a peak of 2.25 but have since been declining.  She has no repeat EKG this morning so we will repeat that this morning and continue to follow.  She is currently on heparin drip.  This likely is due to demand perfusion mismatch from the hypoxia that she had on admission and does not represent a true MI.  She is on ASA 325 and simvastatin.  Beta blocker is being held at the moment for the acute decompensation but we could likely restart it today.    4. Altered mental status, likely secondary  to hypoxia - improved:  Per neurology, no further neuro workup.   5. Hyperkalemia:  Likely related to the acute respiratory acidosis.  K has been trending downward and per the telemetry monitor she does not have any QT elongation.  Bmet from this morning is pending.    6. Hematuria:  Will need to be followed. UCx has been drawn.  Length of Stay: 1 days  Patient history and plan of care reviewed with attending, Dr. Darra Lis, M.D. 07/25/2011, 7:02 AM i am not sure of the sequence of events  And how to explain the left sided gaze preference the hypoventilation and the postive troponin with the antecedent history of DOE  What was the event in the HBO chamber?? Cardiac and if so why  ? Free radicals ? Neurological >> hypoventilation  Will ask neuro to see again Anticipate cath and will call [facility] as medical director is not in town Continue heparin

## 2011-07-25 NOTE — Progress Notes (Signed)
07/25/2011  2215   Repeat CBG 97 [name] [employer]

## 2011-07-25 NOTE — Progress Notes (Signed)
ANTICOAGULATION CONSULT NOTE - Follow Up Consult   Pharmacy Consult:  Heparin Indication: NSTEMI   HL = 0.53 (goal 0.3 - 0.7 units/mL) Heparin weight = 74 kg   Assessment: 60 YOF with NSTEMI on heparin gtt with therapeutic heparin level.  Was concerned with decrease in platelets and timing of heparin exposure, but latest CBC showed improvement in platelets and the level is within normal range (hgb and hct also improving).   Plan: - Continue heparin gtt at 1300 units/hr - F/U AM labs, monitor hematuria     Orlando Devereux D. Laney Potash, PharmD, BCPS 07/25/2011, 7:16 PM

## 2011-07-25 NOTE — Progress Notes (Signed)
Discussion about event process:  Discussed with Fellow at [facility] at [facility] who noted that patients can experience SOB  since  oxygen is given at a higher level then atmospheric pressure . This mostly occurs with underlying lung disease like COPD or Asthma. It is also reported that patient complain occasionally about chest pain  Neurological event can include generalized weakness and possible seizure but no focal event as the patient was described including left sided weakness and right gaze preference. The therapy is well tolerated in general by patient s/p CABG and osteomyelitis. No direct correlation with MI, Hypercarbic event or focal neurological event but patient with history of DM on Insulin and history of cardiothoracic surgery may have increased risk of oxygen toxicity causes both pulmonary and CNS events.  Reviewing the chart it is unclear why patient had significant Hypercarbic episode with a pCO2 of 86 and ph of 7.08.  It is notable that patient had an elevated troponin at 10 am. Patient was placed in the Chamber at 8 am.  That means she had a possible event around 6 am where the patient was asymptomatic.  Patient described SOB as a first symptoms she notices while in the chamber and she further was endorsing SOB since 2-3 weeks.    1. Considering this patient likely had a NSTEMI prior to the Hypercarbic oxygen therapy. May have been experiencing some CHF exacerbation in the last 2- 3 weeks.  CXray is notable for worsening interstitial prominence throughout the lungs,  likely interstitial edema.  Small to moderate bilateral pleural effusions, right greater than left, slightly.Pro- BNP elevated to 3723 ( although unclear baseline, last pro BNP in 01/2012 was 47829) . Could that has caused the Hypercarbic event ?Marland Kitchen Unclear! Plan: Patient will undergo Cath for further evaluation.   2. Seizure vs Stroke vs TIA: CT head negative. Neurology consulted . Recommended MRI and carotid  doppler  Almyra Deforest, MD 07/25/11 11:50 am

## 2011-07-26 ENCOUNTER — Encounter (HOSPITAL_COMMUNITY)
Admission: EM | Disposition: A | Discharge status: Home Health | Payer: Self-pay | Source: Home / Self Care | Attending: Cardiology

## 2011-07-26 ENCOUNTER — Inpatient Hospital Stay (HOSPITAL_COMMUNITY): Payer: 59

## 2011-07-26 ENCOUNTER — Ambulatory Visit: Payer: 59 | Admitting: Cardiology

## 2011-07-26 DIAGNOSIS — R404 Transient alteration of awareness: Secondary | ICD-10-CM

## 2011-07-26 DIAGNOSIS — I2510 Atherosclerotic heart disease of native coronary artery without angina pectoris: Secondary | ICD-10-CM

## 2011-07-26 DIAGNOSIS — I059 Rheumatic mitral valve disease, unspecified: Secondary | ICD-10-CM

## 2011-07-26 DIAGNOSIS — G459 Transient cerebral ischemic attack, unspecified: Secondary | ICD-10-CM

## 2011-07-26 HISTORY — PX: LEFT HEART CATHETERIZATION WITH CORONARY/GRAFT ANGIOGRAM: SHX5450

## 2011-07-26 LAB — LIPID PANEL
Cholesterol: 125 mg/dL (ref 0–200)
HDL: 44 mg/dL (ref >39)
Total CHOL/HDL Ratio: 2.8 RATIO
Triglycerides: 126 mg/dL (ref <150)
VLDL: 25 mg/dL (ref 0–40)

## 2011-07-26 LAB — BASIC METABOLIC PANEL
BUN: 33 mg/dL — ABNORMAL HIGH (ref 6–23)
CO2: 25 mEq/L (ref 19–32)
Chloride: 97 mEq/L (ref 96–112)
Creatinine, Ser: 0.91 mg/dL (ref 0.50–1.10)
Glucose, Bld: 147 mg/dL — ABNORMAL HIGH (ref 70–99)
Potassium: 5 mEq/L (ref 3.5–5.1)

## 2011-07-26 LAB — GLUCOSE, CAPILLARY
Glucose-Capillary: 121 mg/dL — ABNORMAL HIGH (ref 70–99)
Glucose-Capillary: 97 mg/dL (ref 70–99)
Glucose-Capillary: 146 mg/dL — ABNORMAL HIGH (ref 70–99)
Glucose-Capillary: 126 mg/dL — ABNORMAL HIGH (ref 70–99)
Glucose-Capillary: 112 mg/dL — ABNORMAL HIGH (ref 70–99)
Glucose-Capillary: 125 mg/dL — ABNORMAL HIGH (ref 70–99)

## 2011-07-26 LAB — POCT ACTIVATED CLOTTING TIME: Activated Clotting Time: 127 seconds

## 2011-07-26 LAB — HEMOGLOBIN A1C: Mean Plasma Glucose: 154 mg/dL — ABNORMAL HIGH (ref <117)

## 2011-07-26 SURGERY — LEFT HEART CATHETERIZATION WITH CORONARY/GRAFT ANGIOGRAM

## 2011-07-26 SURGERY — LEFT HEART CATHETERIZATION WITH CORONARY ANGIOGRAM
Anesthesia: LOCAL

## 2011-07-26 MED ORDER — ISOSORBIDE MONONITRATE ER 30 MG PO TB24
30.0000 mg | ORAL_TABLET | Freq: Every day | ORAL | Status: DC
  Administered 2011-07-26 – 2011-08-03 (×9): 30 mg via ORAL
  Filled 2011-07-26 (×11): qty 1

## 2011-07-26 MED ORDER — AMIODARONE HCL IN DEXTROSE 360-4.14 MG/200ML-% IV SOLN: 60.0000 mg/h | INTRAVENOUS | Status: DC

## 2011-07-26 MED ORDER — HYDRALAZINE HCL 25 MG PO TABS
12.5000 mg | ORAL_TABLET | Freq: Three times a day (TID) | ORAL | Status: DC
  Administered 2011-07-27 – 2011-08-03 (×18): 12.5 mg via ORAL
  Filled 2011-07-26 (×29): qty 0.5

## 2011-07-26 MED ORDER — NITROGLYCERIN 0.2 MG/ML ON CALL CATH LAB
INTRAVENOUS | Status: AC
  Filled 2011-07-26: qty 1

## 2011-07-26 MED ORDER — MIDAZOLAM HCL 2 MG/2ML IJ SOLN
INTRAMUSCULAR | Status: AC
  Filled 2011-07-26: qty 2

## 2011-07-26 MED ORDER — SODIUM CHLORIDE 0.9 % IJ SOLN
3.0000 mL | Freq: Two times a day (BID) | INTRAMUSCULAR | Status: DC
  Administered 2011-07-26: 3 mL via INTRAVENOUS

## 2011-07-26 MED ORDER — SODIUM CHLORIDE 0.9 % IJ SOLN: 3.0000 mL | INTRAMUSCULAR | Status: DC | PRN

## 2011-07-26 MED ORDER — SODIUM CHLORIDE 0.9 % IV SOLN: INTRAVENOUS | Status: AC

## 2011-07-26 MED ORDER — FUROSEMIDE 20 MG PO TABS: 20.0000 mg | ORAL_TABLET | Freq: Every day | ORAL | Status: DC

## 2011-07-26 MED ORDER — FENTANYL CITRATE 0.05 MG/ML IJ SOLN
INTRAMUSCULAR | Status: AC
  Filled 2011-07-26: qty 2

## 2011-07-26 MED ORDER — NALOXONE HCL 0.4 MG/ML IJ SOLN
INTRAMUSCULAR | Status: AC
  Filled 2011-07-26: qty 1

## 2011-07-26 MED ORDER — FUROSEMIDE 40 MG PO TABS
40.0000 mg | ORAL_TABLET | Freq: Every day | ORAL | Status: DC
  Administered 2011-07-27 – 2011-08-03 (×8): 40 mg via ORAL
  Filled 2011-07-26 (×8): qty 1

## 2011-07-26 MED ORDER — SODIUM CHLORIDE 0.9 % IV SOLN: 250.0000 mL | INTRAVENOUS | Status: DC | PRN

## 2011-07-26 MED ORDER — ASPIRIN 81 MG PO CHEW
324.0000 mg | CHEWABLE_TABLET | ORAL | Status: AC
  Administered 2011-07-26: 324 mg via ORAL
  Filled 2011-07-26: qty 4

## 2011-07-26 MED ORDER — ASPIRIN 325 MG PO TABS
325.0000 mg | ORAL_TABLET | Freq: Every day | ORAL | Status: DC
  Administered 2011-07-27 – 2011-07-28 (×2): 325 mg via ORAL
  Filled 2011-07-26 (×2): qty 1

## 2011-07-26 MED ORDER — NALOXONE HCL 0.4 MG/ML IJ SOLN: 0.2000 mg | Freq: Once | INTRAMUSCULAR | Status: DC

## 2011-07-26 MED ORDER — LIDOCAINE HCL (PF) 1 % IJ SOLN
INTRAMUSCULAR | Status: AC
  Filled 2011-07-26: qty 30

## 2011-07-26 MED ORDER — HEPARIN (PORCINE) IN NACL 2-0.9 UNIT/ML-% IJ SOLN
INTRAMUSCULAR | Status: AC
  Filled 2011-07-26: qty 2000

## 2011-07-26 MED ORDER — AMIODARONE LOAD VIA INFUSION
150.0000 mg | Freq: Once | INTRAVENOUS | Status: DC
  Filled 2011-07-26: qty 83.34

## 2011-07-26 MED ORDER — FLUMAZENIL 0.5 MG/5ML IV SOLN
INTRAVENOUS | Status: AC
  Filled 2011-07-26: qty 5

## 2011-07-26 NOTE — Interval H&P Note (Signed)
History and Physical Interval Note:  07/26/2011 12:46 PM  [name]  has presented today for surgery, with the diagnosis of NSTEMI  The various methods of treatment have been discussed with the patient and family. After consideration of risks, benefits and other options for treatment, the patient has consented to  Procedure(s) (LRB): LEFT HEART CATHETERIZATION WITH CORONARY ANGIOGRAM (N/A) as a surgical intervention .  The patients' history has been reviewed, patient examined, no change in status, stable for surgery.  I have reviewed the patients' chart and labs.  Questions were answered to the patient's satisfaction.     Fahmida Jurich

## 2011-07-26 NOTE — H&P (View-Only) (Signed)
[facility] Resident Note  Subjective:  Feels tired this morning, didn't sleep well overnight.  Had SOB during MRI yesterday while having to lay flat.  Says that over the last few weeks had to increase her pillows from 1-2 to 2-3.  Hasn't been up out of bed since admission.  Sitting up and lying at an angle her breathing is fine.  No chest pain, nausea, vomiting, or increased drainage from the wound.   Objective:  Vital Signs in the last 24 hours: Filed Vitals:   07/26/11 0300 07/26/11 0400 07/26/11 0500 07/26/11 0600  BP: 111/36 121/52 114/47 113/53  Pulse: 91 91 89 88  Temp:  98.9 F (37.2 C)    TempSrc:  Oral    Resp: 24 25 20 22   Height:      Weight:  161 lb 13.1 oz (73.4 kg)    SpO2: 98% 99% 99% 99%   Intake/Output from previous day: 05/29 0701 - 05/30 0700 In: 870 [P.O.:360; I.V.:510] Out: 3225 [Urine:3225]  24-hour weight change: Weight change: -1 lb 1.6 oz (-0.5 kg)  Weight trends: Filed Weights   07/24/11 1711 07/25/11 0500 07/26/11 0400  Weight: 162 lb 14.7 oz (73.9 kg) 168 lb 6.9 oz (76.4 kg) 161 lb 13.1 oz (73.4 kg)   Physical Exam: Vitals reviewed. General: sitting up in bed, tired appearing, NAD HEENT: PERRL, EOMI, no scleral icterus Cardiac: RRR, no rubs, murmurs or gallops Pulm: Decreased breath sounds in the bases bilaterally, no crackles, wheezes, or rhonchi.  Abd: soft, nontender, nondistended, BS present Ext: warm and well perfused, no pedal edema Neuro: alert and oriented X3, cranial nerves II-XII grossly intact, strength and sensation to light touch equal in bilateral upper and lower extremities  Lab Results:  Basename 07/25/11 1655 07/25/11 0557  WBC 11.4* 7.6  HGB 10.3* 8.8*  PLT 184 134*    Basename 07/25/11 0557 07/24/11 1725 07/24/11 1004 07/24/11 0944  NA 134* -- 140 --  K 5.1 5.6* -- --  CL 100 -- 108 --  CO2 20 -- -- 24  GLUCOSE 266* -- 272* --  BUN 35* -- 25* --  CREATININE 1.11* -- 1.30* --    Basename  07/25/11 0843 07/25/11 0105  TROPONINI 1.27* 1.53*    Basename 07/25/11 1128 07/25/11 0916 07/25/11 0738 07/24/11 2110 07/24/11 1010  GLUCAP 213* 291* 262* 355* 262*    Basename 07/24/11 0953  PROBNP 3723.0*   2D echo:  Study Conclusions  - Left ventricle: The cavity size was normal. Wall thickness was normal. Systolic function was mildly to moderately reduced. The estimated ejection fraction was in the range of 40% to 45%. Diffuse hypokinesis. There is severe hypokinesis of the apical myocardium. Doppler parameters are consistent with abnormal left ventricular relaxation (grade 1 diastolic dysfunction). - Left atrium: The atrium was mildly dilated. - Right atrium: The atrium was mildly dilated. - Pulmonary arteries: Systolic pressure was mildly increased. PA peak pressure: 31mm Hg (S). - Pericardium, extracardiac: A trivial pericardial effusion was identified.  Tele:  NSR, rates in the 80s  Scheduled Meds:   . aspirin  325 mg Oral Daily  . carvedilol  3.125 mg Oral BID  . clarithromycin  500 mg Oral BID WC  . furosemide  20 mg Intravenous Once  . gabapentin  300 mg Oral TID  . insulin aspart  0-9 Units Subcutaneous TID WC  . insulin aspart  20 Units Subcutaneous TID WC  . insulin glargine  35 Units Subcutaneous BID  .  mulitivitamin with minerals  1 tablet Oral Daily  . PARoxetine  20 mg Oral q morning - 10a  . pravastatin  40 mg Oral Daily  . sodium chloride  3 mL Intravenous Q12H  . DISCONTD: simvastatin  20 mg Oral QHS   Continuous Infusions:   . heparin 1,300 Units/hr (07/26/11 0409)   PRN Meds:.sodium chloride, acetaminophen, ALPRAZolam, ondansetron (ZOFRAN) IV, sodium chloride, traMADol  Imaging: Ct Head Wo Contrast  07/24/2011  *RADIOLOGY REPORT*  Clinical Data: Patient unresponsive with right gaze preference.  CT HEAD WITHOUT CONTRAST  Technique:  Contiguous axial images were obtained from the base of the skull through the vertex without contrast.   Comparison: None.  Findings: The brain demonstrates no evidence of hemorrhage, infarction, edema, mass effect, extra-axial fluid collection, hydrocephalus or mass lesion.  The skull is unremarkable.  IMPRESSION: No acute findings by CT of the brain.  Findings were communicated directly to Dr. Fonnie Jarvis.  Original Report Authenticated By: Reola Calkins, M.D.   Mr Maxine Glenn Head Wo Contrast  07/25/2011  *RADIOLOGY REPORT*  Clinical Data:  High blood pressure.  Diabetes.  Hyperlipidemia. Presenting with transient episode of left-sided weakness and right gaze preference.  MRI HEAD WITHOUT CONTRAST MRA HEAD WITHOUT CONTRAST  Technique: Multiplanar, multiecho pulse sequences of the brain and surrounding structures were obtained according to standard protocol without intravenous contrast.  Angiographic images of the head were obtained using MRA technique without contrast.  Comparison: 07/24/2011 CT.  No comparison MR.  MRI HEAD  Findings:  No acute infarct.  No intracranial hemorrhage.  Mild small vessel disease type changes.  No intracranial mass lesion detected on this unenhanced exam.  No hydrocephalus.  Opacification mastoid air cells and middle ear cavities more notable on the right. No obstructing lesions seen in the region of posterior-superior nasopharynx.  Minimal paranasal sinus mucosal thickening.  Mild narrowing upper cervical canal.  Mild exophthalmos.  Major intracranial vascular structures are patent.  IMPRESSION: No acute infarct.  Mild small vessel disease type changes.  Opacification mastoid air cells and middle ear cavities more notable on the right.  MRA HEAD  Findings: Mild to moderate narrowing involving portions of the right internal carotid artery cavernous segment and mild narrowing and irregularity of the portions of the left internal carotid artery cavernous segment.  Small bulge M1 segment left middle cerebral artery.  This appears to be origin of a vessel. Tiny aneurysm not entirely excluded.  This is at the limitation of the present exam.  Middle cerebral artery branch vessel irregularity.  Left vertebral artery is dominant.  Mild irregularity right vertebral artery.  Mild irregularity of the basilar artery without high-grade stenosis.  Nonvisualization PICAs.  Fusiform dilated the P1 segment of the left posterior cerebral artery.  Mild narrowing P1 segment right posterior cerebral artery. Moderate to marked stenosis P1-2 segment right posterior cerebral artery.  Posterior cerebral artery distal branch vessel irregularity greater on the right.  IMPRESSION: Intracranial atherosclerotic type changes as detailed above.  Minimal bulge M1 segment left middle cerebral artery may represent origin of a vessel.  Tiny aneurysm not entirely excluded however this is at the limitation of the present exam.  Original Report Authenticated By: Fuller Canada, M.D.   Mr Brain Wo Contrast  07/25/2011  *RADIOLOGY REPORT*  Clinical Data:  High blood pressure.  Diabetes.  Hyperlipidemia. Presenting with transient episode of left-sided weakness and right gaze preference.  MRI HEAD WITHOUT CONTRAST MRA HEAD WITHOUT CONTRAST  Technique: Multiplanar, multiecho pulse sequences  of the brain and surrounding structures were obtained according to standard protocol without intravenous contrast.  Angiographic images of the head were obtained using MRA technique without contrast.  Comparison: 07/24/2011 CT.  No comparison MR.  MRI HEAD  Findings:  No acute infarct.  No intracranial hemorrhage.  Mild small vessel disease type changes.  No intracranial mass lesion detected on this unenhanced exam.  No hydrocephalus.  Opacification mastoid air cells and middle ear cavities more notable on the right. No obstructing lesions seen in the region of posterior-superior nasopharynx.  Minimal paranasal sinus mucosal thickening.  Mild narrowing upper cervical canal.  Mild exophthalmos.  Major intracranial vascular structures are patent.   IMPRESSION: No acute infarct.  Mild small vessel disease type changes.  Opacification mastoid air cells and middle ear cavities more notable on the right.  MRA HEAD  Findings: Mild to moderate narrowing involving portions of the right internal carotid artery cavernous segment and mild narrowing and irregularity of the portions of the left internal carotid artery cavernous segment.  Small bulge M1 segment left middle cerebral artery.  This appears to be origin of a vessel. Tiny aneurysm not entirely excluded. This is at the limitation of the present exam.  Middle cerebral artery branch vessel irregularity.  Left vertebral artery is dominant.  Mild irregularity right vertebral artery.  Mild irregularity of the basilar artery without high-grade stenosis.  Nonvisualization PICAs.  Fusiform dilated the P1 segment of the left posterior cerebral artery.  Mild narrowing P1 segment right posterior cerebral artery. Moderate to marked stenosis P1-2 segment right posterior cerebral artery.  Posterior cerebral artery distal branch vessel irregularity greater on the right.  IMPRESSION: Intracranial atherosclerotic type changes as detailed above.  Minimal bulge M1 segment left middle cerebral artery may represent origin of a vessel.  Tiny aneurysm not entirely excluded however this is at the limitation of the present exam.  Original Report Authenticated By: Fuller Canada, M.D.   Dg Chest Port 1 View  07/25/2011  *RADIOLOGY REPORT*  Clinical Data: Congestive heart failure, improving shortness of breath  PORTABLE CHEST - 1 VIEW  Comparison: Portable chest x-ray of 07/24/2011  Findings: There may be slight improvement in the degree of moderate pulmonary vascular congestion.  Cardiomegaly, bilateral effusions, and mild congestion remain.  Median sternotomy sutures are noted.  IMPRESSION: Slight improvement in congestive heart failure pattern.  Bilateral pleural effusions remain.  Original Report Authenticated By: Juline Patch,  M.D.   Dg Chest Port 1 View  07/24/2011  *RADIOLOGY REPORT*  Clinical Data: Shortness of breath.  PORTABLE CHEST - 1 VIEW  Comparison: 06/29/2011  Findings: Worsening interstitial prominence throughout the lungs, likely interstitial edema.  Mild cardiomegaly.  Small to moderate bilateral pleural effusions, right greater than left, slightly increased since prior study.  Bibasilar atelectasis.  Prior CABG.  IMPRESSION: Worsening interstitial prominence, likely interstitial edema/CHF. Enlarging bilateral effusions.  Original Report Authenticated By: Cyndie Chime, M.D.   Assessment/Plan:  Pt is a 61 y.o. yo female with a PMHx of CAD s/p CABG, sternal osteomyelitis, breast cancer, and chronic systolic heart failure who was admitted on 07/24/2011 for and episode of acute respiratory failure during a hyperbaric oxygen therapy session.   1. Acute respiratory failure: Likely secondary to her acute on chronic systolic heart failure exacerbation. She was taken off the BIPAP and has been maintaining well on [place] since admission.  She states that she started to feel short of breath in the chamber before any other symptoms. She  continues to have some orthopnea and has some decreased breath sounds in the bases today.  Chest x-ray pending.  Patient's with insulin controlled diabetes and recheck chest surgery are at increased risk for pulmonary and CNS oxygen toxicity which can manifest as either seizure or chest pressure, shortness of breath, and transient decreased pulmonary function per Uptodate.    2. Acute on chronic systolic heart failure exacerbation: She states that she had been feeling mildly more short of breath over the last few weeks but had no weight gain or increase in swelling. She is negative about 2.7L since admission.  She states that she is not on lasix as an outpatient.  3. NSTEMI: Troponins were positive on admission and rose to a peak of 2.25 but have since been declining.  She started in the chamber  about 8 pm and enzymes were elevated about 10 am which means she probably had the NSTEMI secondary to #2 that was exacerbated by the hyperbaric chamber.  EKG done yesterday did show some ST depression in the lateral leads and some T wave inversions in V2 and V3.  She likely could benefit from the cath during this admission.   4. Altered mental status, likely secondary to hypoxia - improved: MRI was negative for acute pathology.  Neurology is following.  Recommended carotid dopplers as well which are non-obstructive.    5. Hyperkalemia: Likely related to the acute respiratory acidosis. K has been trending downward and per the telemetry monitor she does not have any QT elongation. This has resolved as of yesterday.  6.  Diabetes Mellitus:  She had an episode of hypoglycemia yesterday that required a carbohydrate snack.  Likely related to decreased oral intake.  Hypoglycemia is also a consequence of hyperbaric oxygen therapy as well.  We will need to continue to monitor.    7. Hematuria: Will need to be followed. UCx has been drawn.  Length of Stay: 2 days  Patient history and plan of care reviewed with attending, Dr.   Leodis Sias, M.D. 07/26/2011, 6:50 AM  Patient seen with resident, agree with above note.  Patient presented with respiratory arrest, acute on chronic systolic CHF, and elevated cardiac enzymes.  1. CHF: Acute on chronic systolic CHF.  EF 40-45% on last echo a few months ago.  After diuresis, she appears near euvolemic.  CXR is better.  - Continue Coreg.  Will add hydralazine 12.5 tid/Imdur 30 => holding off on ACEI with rather persistent hyperkalemia. - Change to po Lasix 40 mg daily.  - Repeat echo.  2. NSTEMI: ? Demand ischemia in setting of volume overload versus true ACS.  Plan for cardiac cath today.   Marca Ancona 07/26/2011 8:17 AM

## 2011-07-26 NOTE — Progress Notes (Signed)
Called to see patient by nursing staff in the cath lab holding area. The patient had been verbal before the procedure and had no confusion. The cardiac cath was uncomplicated and we had no complications. Approximately 40 minutes following the cath, nursing staff noted the patient to be somnolent and confused. Her speech was slurred. She was pleasant but clearly altered.  Vital signs stable. EKG unchanged.   Pt able to move all extremities. There may be slight right facial droop. She is oriented to person but not place. 0.2 mg Flumazenil given x 1 with no change in status. Code Stroke called. Code Stroke team arrived in holding area at 2:40pm and began assessment of patient. After examination, it was decided to proceed with STAT head CT.   Of note, patient was seen by the Code Stroke team on 07/24/11 for neurological symptoms and has undergone MRI head, CT head. She has been followed by the Neurology team here in the hospital.   No changes in cardiac plan. Medical management of her CAD. Further plans in regard to neurological changes based on findings of Neurology team.   [facility] 2:49 PM 07/26/2011

## 2011-07-26 NOTE — Progress Notes (Signed)
Inpatient Diabetes Program Recommendations  AACE/ADA: New Consensus Statement on Inpatient Glycemic Control (2009)  Target Ranges:  Prepandial:   less than 140 mg/dL      Peak postprandial:   less than 180 mg/dL (1-2 hours)      Critically ill patients:  140 - 180 mg/dL    Hypoglycemia last night at 2146 CBG=43.  Inpatient Diabetes Program Recommendations Insulin - Meal Coverage: Discontinue Novolog 20 units TID   Concerned that Novolog 20 units last night at 1700 caused hypoglycemia at 2146.   Will follow.  Thank you  Piedad Climes [facility] Inpatient Diabetes Coordinator [phone]

## 2011-07-26 NOTE — Code Documentation (Addendum)
61 yo female who underwent cardiac cath (no interventions) and seemed to tol it well. Laughing/conversing with staff during procedure, LKW 1433.  She was taken to the holding area, noted to be diff to arouse, reversed the sedation w/o much improvement, noted to be confused and with inappropriate words. Code stroke was activated at 1437. Stroke team arrived at 1439.  Pt with no new focal deficits...sl disconjugate gaze (noted on 5/28); she is lethargic and with word salad. Taken to CT which showed no acute abnormality. Dr Roseanne Reno examined pt at 1515. SBP only 102-108. O2 sats are WNL.   No indication for acute intervention.  Speech is improving..now words more appropriate, though still confused (says it's January and that she is 76). Code Stroke canceled at 1512.   Addendum:  Recommend systolic blood pressure be kept at no less than 90. With the extent of her heart failure, I will defer to Cardiology service for management of hypotension, should it occur. We'll also recommend proceeding with repeat carotid Doppler study to rule out significant increase in ICA stenosis since November 2012. We will continue to follow her with you.  Venetia Maxon M.D. [facility] [phone]

## 2011-07-26 NOTE — CV Procedure (Signed)
Cardiac Catheterization Operative Report  [name] [id number] 5/30/20131:31 PM Leone Payor, MD, MD  Procedure Performed:  1. Left Heart Catheterization 2. Selective Coronary Angiography 3. Left ventricular angiogram 4. SVG graft angiography  Operator: Verne Carrow, MD  Indication:  Pt with known severe ischemic cardiomyopathy, severe triple vessel CAD with multiple prior stenting procedures and s/p 4V CABG in November 2012 who is admitted with acute on chronic systolic CHF. Her troponin was mildly elevated. She has had no chest pain.                                    Procedure Details: The risks, benefits, complications, treatment options, and expected outcomes were discussed with the patient. The patient and/or family concurred with the proposed plan, giving informed consent. The patient was brought to the cath lab after IV hydration was begun and oral premedication was given. The patient was further sedated with Versed and Fentanyl. The right groin was prepped and draped in the usual manner. Using the modified Seldinger access technique, a 5 French sheath was placed in the right femoral artery. Standard diagnostic catheters were used to perform selective coronary angiography. The JR4 catheter was used to engage all three vein grafts. A pigtail catheter was used to perform a left ventricular angiogram.  There were no immediate complications. The patient was taken to the recovery area in stable condition.   Hemodynamic Findings: Central aortic pressure: 96/47 Left ventricular pressure: 93/5/12  Angiographic Findings:  Left main:  Distal 40% stenosis.   Left Anterior Descending Artery: Large caliber vessel that courses to the apex. 99% stenosis in the ostium. The proximal vessel fills slowly antegrade and then has 100% mid stenosis. The mid vessel and the diagonal branch fills from the vein graft. There is a stent in the mid vessel. The distal LAD fills from  right to left collaterals via the filling of the distal RCA from the vein graft to the distal RCA.   Circumflex Artery: Small to moderate sized vessel with patent stents proximal and mid vessel. There is competitive filling into the most distal OM branch from the vein graft. The Ramus branch is moderate sized and is occluded proximally. This branch fills from the patent vein graft.   Right Coronary Artery: Large, dominant artery with patent stents in the proximal and mid vessel. The mid vessel has a  99% stenosis. The distal vessel has diffuse plaque disease. A large RV marginal branch fills from the vein graft and this fills the distal RCA as well.   Graft Anatomy:   SVG to large RV marginal branch is patent. This fills the marginal branch and also competitively fills the distal RCA and PDA  Sequential SVG to LAD and small OM branch: The segment to the LAD is patent. This also fills the diagonal branch. The distal LAD is not filled by this graft. The sequential limb to the OM branch is sewn in as a Y graft in the proximal body of the SVG to the LAD. This limb to the OM branch has diffuse high grade stenosis. This graft supplies a small 1.5 mm OM branch. This is a small area of myocardium.   SVG to Ramus Intermediate is patent. Mild disease in the body of the vein graft.  Left Ventricular Angiogram: LVEF 20-25%.   Impression: 1. 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 The vein  graft to the OM is small in caliber and diffusely diseased. The target vessel (OM branch) is very small.  2. Severe LV systolic dysfunction.  Recommendations: Continue medical management. The SVG to the OM arises in a "Y" fashion from the SVG to the LAD. This graft is small and diffusely diseased and supplies a small target vessel. This is not favorable for PCI.        Complications:  None. The patient tolerated the procedure well.

## 2011-07-26 NOTE — Progress Notes (Signed)
Cor arteriograms,2D echo reviewed Severe diabetic dz with patent grafts ,SVG to small OM diseased but open LV EF .40 w/o MR Agree with medical management Will follow small sternal wound in office and stop wound care HBO2 Neuro deficit post cath noted

## 2011-07-26 NOTE — Progress Notes (Signed)
  Echocardiogram 2D Echocardiogram has been performed.  Alanzo Lamb L 07/26/2011, 11:26 AM

## 2011-07-26 NOTE — Progress Notes (Signed)
Subjective:  Patient awake and alert in bed. No further episodes of weakness, visual disturbance, or aphasia/slurred speech  Objective: Current vital signs: BP 107/44  Pulse 81  Temp(Src) 98.9 F (37.2 C) (Oral)  Resp 26  Ht 5' 4.96" (1.65 m)  Wt 73.4 kg (161 lb 13.1 oz)  BMI 26.96 kg/m2  SpO2 98%  LMP 01/05/1996 Vital signs in last 24 hours: Temp:  [97.6 F (36.4 C)-98.9 F (37.2 C)] 98.9 F (37.2 C) (05/30 0400) Pulse Rate:  [80-101] 81  (05/30 0934) Resp:  [17-29] 26  (05/30 0900) BP: (105-149)/(36-66) 107/44 mmHg (05/30 0934) SpO2:  [97 %-100 %] 98 % (05/30 0900) Weight:  [73.4 kg (161 lb 13.1 oz)] 73.4 kg (161 lb 13.1 oz) (05/30 0400)  Intake/Output from previous day: 05/29 0701 - 05/30 0700 In: 893 [P.O.:360; I.V.:533] Out: 3225 [Urine:3225] Intake/Output this shift: Total I/O In: 46 [I.V.:46] Out: 400 [Urine:400] Nutritional status: NPO  Neurologic Exam: Mental Status: Alert, oriented, thought content appropriate.  Speech fluent without evidence of aphasia.  Able to follow 3 step commands without difficulty. Cranial Nerves: II: visual fields grossly normal, pupils equal, round, reactive to light and accommodation III,IV, VI: ptosis not present, extra-ocular motions intact bilaterally V,VII: smile symmetric, facial light touch sensation normal bilaterally VIII: hearing normal bilaterally IX,X: gag reflex present XI: trapezius strength/neck flexion strength normal bilaterally XII: tongue strength normal  Motor: Right : Upper extremity   5/5    Left:     Upper extremity   5/5  Lower extremity   5/5     Lower extremity   5/5 Tone and bulk:normal tone throughout; no atrophy noted Sensory: Pinprick and light touch intact throughout, bilaterally Deep Tendon Reflexes: 2+ and symmetric throughout Cerebellar: normal finger-to-nose, normal rapid alternating movements and normal heel-to-shin test normal gait and station    Lab Results: CBC:  Lab 07/25/11 1655  07/25/11 0557 07/24/11 0944  WBC 11.4* 7.6 --  NEUTROABS -- -- 8.9*  HGB 10.3* 8.8* --  HCT 31.6* 27.1* --  MCV 84.3 83.4 --  PLT 184 134* --   Basic Metabolic Panel:  Lab 07/26/11 4098 07/25/11 0557  NA 134* 134*  K 5.0 5.1  CL 97 100  CO2 25 20  GLUCOSE 147* 266*  BUN 33* 35*  CREATININE 0.91 1.11*  CALCIUM 9.2 8.6  MG -- --  PHOS -- --   Liver Function Tests:  Lab 07/24/11 0944  AST 29  ALT 20  ALKPHOS 102  BILITOT 0.2*  PROT 8.7*  ALBUMIN 3.5    Fasting Lipid Panel:  Lab 07/26/11 0637  CHOL 125  HDL 44  LDLCALC 56  TRIG 126  CHOLHDL 2.8  LDLDIRECT --   Coagulation:  Lab 07/24/11 0944  LABPROT 15.2  INR 1.18     Dg Chest 2 View  07/26/2011  *RADIOLOGY REPORT*  Clinical Data: Congestive heart failure.  Weakness.  Shortness of breath.  CHEST - 2 VIEW  Comparison:  Portable upright chest radiograph 07/25/2011.  Findings: Borderline cardiomegaly is stable.  Bilateral pleural effusions are not significantly changed.  The right pleural effusion may be loculated given its lateral and posterior position. Mild pulmonary vascular congestion is stable.  Bibasilar airspace disease is unchanged, likely representing atelectasis.  IMPRESSION:  1.  Stable appearance of bilateral pleural effusions and airspace disease. 2.  The right pleural effusion may be loculated. 3.  Stable pulmonary vascular congestion.  Original Report Authenticated By: Jamesetta Orleans. MATTERN, M.D.   Mr Maxine Glenn  Head Wo Contrast  07/25/2011  *RADIOLOGY REPORT*  Clinical Data:  High blood pressure.  Diabetes.  Hyperlipidemia. Presenting with transient episode of left-sided weakness and right gaze preference.  MRI HEAD WITHOUT CONTRAST MRA HEAD WITHOUT CONTRAST  Technique: Multiplanar, multiecho pulse sequences of the brain and surrounding structures were obtained according to standard protocol without intravenous contrast.  Angiographic images of the head were obtained using MRA technique without contrast.   Comparison: 07/24/2011 CT.  No comparison MR.  MRI HEAD  Findings:  No acute infarct.  No intracranial hemorrhage.  Mild small vessel disease type changes.  No intracranial mass lesion detected on this unenhanced exam.  No hydrocephalus.  Opacification mastoid air cells and middle ear cavities more notable on the right. No obstructing lesions seen in the region of posterior-superior nasopharynx.  Minimal paranasal sinus mucosal thickening.  Mild narrowing upper cervical canal.  Mild exophthalmos.  Major intracranial vascular structures are patent.  IMPRESSION: No acute infarct.  Mild small vessel disease type changes.  Opacification mastoid air cells and middle ear cavities more notable on the right.  MRA HEAD  Findings: Mild to moderate narrowing involving portions of the right internal carotid artery cavernous segment and mild narrowing and irregularity of the portions of the left internal carotid artery cavernous segment.  Small bulge M1 segment left middle cerebral artery.  This appears to be origin of a vessel. Tiny aneurysm not entirely excluded. This is at the limitation of the present exam.  Middle cerebral artery branch vessel irregularity.  Left vertebral artery is dominant.  Mild irregularity right vertebral artery.  Mild irregularity of the basilar artery without high-grade stenosis.  Nonvisualization PICAs.  Fusiform dilated the P1 segment of the left posterior cerebral artery.  Mild narrowing P1 segment right posterior cerebral artery. Moderate to marked stenosis P1-2 segment right posterior cerebral artery.  Posterior cerebral artery distal branch vessel irregularity greater on the right.  IMPRESSION: Intracranial atherosclerotic type changes as detailed above.  Minimal bulge M1 segment left middle cerebral artery may represent origin of a vessel.  Tiny aneurysm not entirely excluded however this is at the limitation of the present exam.  Original Report Authenticated By: Fuller Canada, M.D.   Mr  Brain Wo Contrast  07/25/2011  *RADIOLOGY REPORT*  Clinical Data:  High blood pressure.  Diabetes.  Hyperlipidemia. Presenting with transient episode of left-sided weakness and right gaze preference.  MRI HEAD WITHOUT CONTRAST MRA HEAD WITHOUT CONTRAST  Technique: Multiplanar, multiecho pulse sequences of the brain and surrounding structures were obtained according to standard protocol without intravenous contrast.  Angiographic images of the head were obtained using MRA technique without contrast.  Comparison: 07/24/2011 CT.  No comparison MR.  MRI HEAD  Findings:  No acute infarct.  No intracranial hemorrhage.  Mild small vessel disease type changes.  No intracranial mass lesion detected on this unenhanced exam.  No hydrocephalus.  Opacification mastoid air cells and middle ear cavities more notable on the right. No obstructing lesions seen in the region of posterior-superior nasopharynx.  Minimal paranasal sinus mucosal thickening.  Mild narrowing upper cervical canal.  Mild exophthalmos.  Major intracranial vascular structures are patent.  IMPRESSION: No acute infarct.  Mild small vessel disease type changes.  Opacification mastoid air cells and middle ear cavities more notable on the right.  MRA HEAD  Findings: Mild to moderate narrowing involving portions of the right internal carotid artery cavernous segment and mild narrowing and irregularity of the portions of the left internal carotid  artery cavernous segment.  Small bulge M1 segment left middle cerebral artery.  This appears to be origin of a vessel. Tiny aneurysm not entirely excluded. This is at the limitation of the present exam.  Middle cerebral artery branch vessel irregularity.  Left vertebral artery is dominant.  Mild irregularity right vertebral artery.  Mild irregularity of the basilar artery without high-grade stenosis.  Nonvisualization PICAs.  Fusiform dilated the P1 segment of the left posterior cerebral artery.  Mild narrowing P1 segment  right posterior cerebral artery. Moderate to marked stenosis P1-2 segment right posterior cerebral artery.  Posterior cerebral artery distal branch vessel irregularity greater on the right.  IMPRESSION: Intracranial atherosclerotic type changes as detailed above.  Minimal bulge M1 segment left middle cerebral artery may represent origin of a vessel.  Tiny aneurysm not entirely excluded however this is at the limitation of the present exam.  Original Report Authenticated By: Fuller Canada, M.D.   Dg Chest Port 1 View  07/25/2011  *RADIOLOGY REPORT*  Clinical Data: Congestive heart failure, improving shortness of breath  PORTABLE CHEST - 1 VIEW  Comparison: Portable chest x-ray of 07/24/2011  Findings: There may be slight improvement in the degree of moderate pulmonary vascular congestion.  Cardiomegaly, bilateral effusions, and mild congestion remain.  Median sternotomy sutures are noted.  IMPRESSION: Slight improvement in congestive heart failure pattern.  Bilateral pleural effusions remain.  Original Report Authenticated By: Juline Patch, M.D.   Dg Chest Port 1 View  07/24/2011  *RADIOLOGY REPORT*  Clinical Data: Shortness of breath.  PORTABLE CHEST - 1 VIEW  Comparison: 06/29/2011  Findings: Worsening interstitial prominence throughout the lungs, likely interstitial edema.  Mild cardiomegaly.  Small to moderate bilateral pleural effusions, right greater than left, slightly increased since prior study.  Bibasilar atelectasis.  Prior CABG.  IMPRESSION: Worsening interstitial prominence, likely interstitial edema/CHF. Enlarging bilateral effusions.  Original Report Authenticated By: Cyndie Chime, M.D.   2D-ECHO 03/01/11  - Left ventricle: The cavity size was normal. Wall thickness was normal. Systolic function was mildly to moderately reduced. The estimated ejection fraction was in the range of 40% to 45%. Diffuse hypokinesis. There is severe hypokinesis of the apical myocardium. Doppler  parameters are consistent with abnormal left ventricular relaxation (grade 1 diastolic dysfunction). - Left atrium: The atrium was mildly dilated. - Right atrium: The atrium was mildly dilated. - Pulmonary arteries: Systolic pressure was mildly increased. PA peak pressure: 31mm Hg (S). - Pericardium, extracardiac: A trivial pericardial effusion was identified.  Carotid Dopplers: 01/08/11 There is a Right ICA 60 to79%low end of scalestenosis by velocity. There is a Left ICA60 to 79% low to mid end of scale stenosis byvelocity. Vertebrals are patent with antegrade flow.    Medications:  Prior to Admission:  Prescriptions prior to admission  Medication Sig Dispense Refill  . aspirin 325 MG tablet Take 325 mg by mouth daily.       . carvedilol (COREG) 3.125 MG tablet Take 1 tablet (3.125 mg total) by mouth 2 (two) times daily.  180 tablet  3  . clarithromycin (BIAXIN) 500 MG tablet Take 500 mg by mouth 2 (two) times daily. Started at wound clinic      . gabapentin (NEURONTIN) 300 MG capsule Take 300 mg by mouth 3 (three) times daily.       . insulin glargine (LANTUS) 100 UNIT/ML injection Inject 35 Units into the skin 2 (two) times daily.       . insulin lispro (HUMALOG) 100 UNIT/ML  injection Inject 20 Units into the skin 3 (three) times daily before meals.       . Multiple Vitamin (MULITIVITAMIN WITH MINERALS) TABS Take 1 tablet by mouth daily.      Marland Kitchen PARoxetine (PAXIL) 20 MG tablet Take 20 mg by mouth every morning.       . simvastatin (ZOCOR) 20 MG tablet Take 1 tablet (20 mg total) by mouth at bedtime.  90 tablet  1  . traMADol (ULTRAM) 50 MG tablet Take 50 mg by mouth every 6 (six) hours as needed. For pain       Scheduled:   . aspirin  324 mg Oral Pre-Cath  . aspirin  325 mg Oral Daily  . carvedilol  3.125 mg Oral BID  . clarithromycin  500 mg Oral BID WC  . furosemide  20 mg Intravenous Once  . furosemide  40 mg Oral Daily  . gabapentin  300 mg Oral TID  . hydrALAZINE   12.5 mg Oral Q8H  . insulin aspart  0-9 Units Subcutaneous TID WC  . insulin aspart  20 Units Subcutaneous TID WC  . insulin glargine  35 Units Subcutaneous BID  . isosorbide mononitrate  30 mg Oral Daily  . mulitivitamin with minerals  1 tablet Oral Daily  . PARoxetine  20 mg Oral q morning - 10a  . pravastatin  40 mg Oral Daily  . sodium chloride  3 mL Intravenous Q12H  . sodium chloride  3 mL Intravenous Q12H  . DISCONTD: amiodarone  150 mg Intravenous Once  . DISCONTD: aspirin  325 mg Oral Daily  . DISCONTD: furosemide  20 mg Oral Daily  . DISCONTD: simvastatin  20 mg Oral QHS    Assessment/Plan:   61yo female presented with weakness left boday with right gaze preference. This has resolved.  Heat CT unremarkable.  MRI-MRA head show some narrowing of carotid arteries as documented on pre-CABG doppler. No further symptoms.  1) Await carotid dopplers to see if any progression of disease. 2) Repeat ECHO still pending. 3) Will review and make recommendations following studies, otherwise continue ASA daily. These tests have been done within the past 6 mos so major changes in these studies are not suspected. If ICA stenosis has worsened, rec: vascular intervention.  No further neurologic intervention is recommended at this time.  If further questions arise, please call or page at that time.  Thank you for allowing neurology to participate in the care of this patient.    LOS: 2 days  Guy Franco [facility] Pager [phone]  07/26/2011  10:11 AM

## 2011-07-26 NOTE — Progress Notes (Signed)
ANTICOAGULATION CONSULT NOTE - Follow Up Consult  Pharmacy Consult for heparin Indication: NSTEMI  Labs:  Basename 07/26/11 8413 07/25/11 1655 07/25/11 0843 07/25/11 0557 07/25/11 0105 07/24/11 1726 07/24/11 1004 07/24/11 0944  HGB -- 10.3* -- 8.8* -- -- -- --  HCT -- 31.6* -- 27.1* -- -- 40.0 --  PLT -- 184 -- 134* -- -- -- 330  APTT -- -- -- -- -- -- -- 35  LABPROT -- -- -- -- -- -- -- 15.2  INR -- -- -- -- -- -- -- 1.18  HEPARINUNFRC 0.31 0.53 0.28* -- -- -- -- --  CREATININE 0.91 -- -- 1.11* -- -- 1.30* --  CKTOTAL -- -- 102 -- 123 137 -- --  CKMB -- -- 7.4* -- 8.2* 8.3* -- --  TROPONINI -- -- 1.27* -- 1.53* 2.25* -- --   Admit Complaint: 61 y.o.  female with CAD, CABG 11/12 admitted 07/24/2011 after her first hyperbaric oxygen therapy after coming out unresponsive and diaphoretic.  Pharmacy consulted to dose heparin for ACS  Assessment: Anticoagulation: NSTEMI: heparin at 1300/hr (HL 0.31), no bleeding, no infusion problems. CBC drop 5/29, hematuria.  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 891/3225 (-2.3L), follow up need for ACEI at discharge (holding currently w/ persistent hyperkalemia, K 5); lipids wnl Endocrinology: DM: SSI, lantus, on home regimen; cbgs 147-291. Gastrointestinal / Nutrition: PUD; NPO for cath Neurology: Depression, anxiety, periph neuropathy: gabapentin, Paroxetine; AMS - improved, MRI neg; carotid dopplers non-obs Nephrology: renal fxn stable; K 5 Pulmonary: 98% RA Hematology / Oncology: history of Breast CA PTA Medication Issues: All Home Meds Ordered  Best  Practices: DVT Prophylaxis:  Full dose heparin   Goal of Therapy:  Heparin level 0.3-0.7 units/ml   Plan:  Continue heparin at 1300 units/hr F/up post cath  --- In light of ongoing cardiac workup and drug interaction with simvastatin consider alternative antibiotic ----   Thank you for allowing pharmacy to be a part of this patients care team.  Rolland Porter, Pharm.D., BCPS Clinical Pharmacist Pager: [phone] 07/26/2011 9:58 AM

## 2011-07-26 NOTE — Progress Notes (Signed)
 [facility] Resident Note  Subjective:  Feels tired this morning, didn't sleep well overnight.  Had SOB during MRI yesterday while having to lay flat.  Says that over the last few weeks had to increase her pillows from 1-2 to 2-3.  Hasn't been up out of bed since admission.  Sitting up and lying at an angle her breathing is fine.  No chest pain, nausea, vomiting, or increased drainage from the wound.   Objective:  Vital Signs in the last 24 hours: Filed Vitals:   07/26/11 0300 07/26/11 0400 07/26/11 0500 07/26/11 0600  BP: 111/36 121/52 114/47 113/53  Pulse: 91 91 89 88  Temp:  98.9 F (37.2 C)    TempSrc:  Oral    Resp: 24 25 20 22  Height:      Weight:  161 lb 13.1 oz (73.4 kg)    SpO2: 98% 99% 99% 99%   Intake/Output from previous day: 05/29 0701 - 05/30 0700 In: 870 [P.O.:360; I.V.:510] Out: 3225 [Urine:3225]  24-hour weight change: Weight change: -1 lb 1.6 oz (-0.5 kg)  Weight trends: Filed Weights   07/24/11 1711 07/25/11 0500 07/26/11 0400  Weight: 162 lb 14.7 oz (73.9 kg) 168 lb 6.9 oz (76.4 kg) 161 lb 13.1 oz (73.4 kg)   Physical Exam: Vitals reviewed. General: sitting up in bed, tired appearing, NAD HEENT: PERRL, EOMI, no scleral icterus Cardiac: RRR, no rubs, murmurs or gallops Pulm: Decreased breath sounds in the bases bilaterally, no crackles, wheezes, or rhonchi.  Abd: soft, nontender, nondistended, BS present Ext: warm and well perfused, no pedal edema Neuro: alert and oriented X3, cranial nerves II-XII grossly intact, strength and sensation to light touch equal in bilateral upper and lower extremities  Lab Results:  Basename 07/25/11 1655 07/25/11 0557  WBC 11.4* 7.6  HGB 10.3* 8.8*  PLT 184 134*    Basename 07/25/11 0557 07/24/11 1725 07/24/11 1004 07/24/11 0944  NA 134* -- 140 --  K 5.1 5.6* -- --  CL 100 -- 108 --  CO2 20 -- -- 24  GLUCOSE 266* -- 272* --  BUN 35* -- 25* --  CREATININE 1.11* -- 1.30* --    Basename  07/25/11 0843 07/25/11 0105  TROPONINI 1.27* 1.53*    Basename 07/25/11 1128 07/25/11 0916 07/25/11 0738 07/24/11 2110 07/24/11 1010  GLUCAP 213* 291* 262* 355* 262*    Basename 07/24/11 0953  PROBNP 3723.0*   2D echo:  Study Conclusions  - Left ventricle: The cavity size was normal. Wall thickness was normal. Systolic function was mildly to moderately reduced. The estimated ejection fraction was in the range of 40% to 45%. Diffuse hypokinesis. There is severe hypokinesis of the apical myocardium. Doppler parameters are consistent with abnormal left ventricular relaxation (grade 1 diastolic dysfunction). - Left atrium: The atrium was mildly dilated. - Right atrium: The atrium was mildly dilated. - Pulmonary arteries: Systolic pressure was mildly increased. PA peak pressure: 31mm Hg (S). - Pericardium, extracardiac: A trivial pericardial effusion was identified.  Tele:  NSR, rates in the 80s  Scheduled Meds:   . aspirin  325 mg Oral Daily  . carvedilol  3.125 mg Oral BID  . clarithromycin  500 mg Oral BID WC  . furosemide  20 mg Intravenous Once  . gabapentin  300 mg Oral TID  . insulin aspart  0-9 Units Subcutaneous TID WC  . insulin aspart  20 Units Subcutaneous TID WC  . insulin glargine  35 Units Subcutaneous BID  .   mulitivitamin with minerals  1 tablet Oral Daily  . PARoxetine  20 mg Oral q morning - 10a  . pravastatin  40 mg Oral Daily  . sodium chloride  3 mL Intravenous Q12H  . DISCONTD: simvastatin  20 mg Oral QHS   Continuous Infusions:   . heparin 1,300 Units/hr (07/26/11 0409)   PRN Meds:.sodium chloride, acetaminophen, ALPRAZolam, ondansetron (ZOFRAN) IV, sodium chloride, traMADol  Imaging: Ct Head Wo Contrast  07/24/2011  *RADIOLOGY REPORT*  Clinical Data: Patient unresponsive with right gaze preference.  CT HEAD WITHOUT CONTRAST  Technique:  Contiguous axial images were obtained from the base of the skull through the vertex without contrast.   Comparison: None.  Findings: The brain demonstrates no evidence of hemorrhage, infarction, edema, mass effect, extra-axial fluid collection, hydrocephalus or mass lesion.  The skull is unremarkable.  IMPRESSION: No acute findings by CT of the brain.  Findings were communicated directly to Dr. Bednar.  Original Report Authenticated By: GLENN T. YAMAGATA, M.D.   Mr Mra Head Wo Contrast  07/25/2011  *RADIOLOGY REPORT*  Clinical Data:  High blood pressure.  Diabetes.  Hyperlipidemia. Presenting with transient episode of left-sided weakness and right gaze preference.  MRI HEAD WITHOUT CONTRAST MRA HEAD WITHOUT CONTRAST  Technique: Multiplanar, multiecho pulse sequences of the brain and surrounding structures were obtained according to standard protocol without intravenous contrast.  Angiographic images of the head were obtained using MRA technique without contrast.  Comparison: 07/24/2011 CT.  No comparison MR.  MRI HEAD  Findings:  No acute infarct.  No intracranial hemorrhage.  Mild small vessel disease type changes.  No intracranial mass lesion detected on this unenhanced exam.  No hydrocephalus.  Opacification mastoid air cells and middle ear cavities more notable on the right. No obstructing lesions seen in the region of posterior-superior nasopharynx.  Minimal paranasal sinus mucosal thickening.  Mild narrowing upper cervical canal.  Mild exophthalmos.  Major intracranial vascular structures are patent.  IMPRESSION: No acute infarct.  Mild small vessel disease type changes.  Opacification mastoid air cells and middle ear cavities more notable on the right.  MRA HEAD  Findings: Mild to moderate narrowing involving portions of the right internal carotid artery cavernous segment and mild narrowing and irregularity of the portions of the left internal carotid artery cavernous segment.  Small bulge M1 segment left middle cerebral artery.  This appears to be origin of a vessel. Tiny aneurysm not entirely excluded.  This is at the limitation of the present exam.  Middle cerebral artery branch vessel irregularity.  Left vertebral artery is dominant.  Mild irregularity right vertebral artery.  Mild irregularity of the basilar artery without high-grade stenosis.  Nonvisualization PICAs.  Fusiform dilated the P1 segment of the left posterior cerebral artery.  Mild narrowing P1 segment right posterior cerebral artery. Moderate to marked stenosis P1-2 segment right posterior cerebral artery.  Posterior cerebral artery distal branch vessel irregularity greater on the right.  IMPRESSION: Intracranial atherosclerotic type changes as detailed above.  Minimal bulge M1 segment left middle cerebral artery may represent origin of a vessel.  Tiny aneurysm not entirely excluded however this is at the limitation of the present exam.  Original Report Authenticated By: STEVEN R. OLSON, M.D.   Mr Brain Wo Contrast  07/25/2011  *RADIOLOGY REPORT*  Clinical Data:  High blood pressure.  Diabetes.  Hyperlipidemia. Presenting with transient episode of left-sided weakness and right gaze preference.  MRI HEAD WITHOUT CONTRAST MRA HEAD WITHOUT CONTRAST  Technique: Multiplanar, multiecho pulse sequences   of the brain and surrounding structures were obtained according to standard protocol without intravenous contrast.  Angiographic images of the head were obtained using MRA technique without contrast.  Comparison: 07/24/2011 CT.  No comparison MR.  MRI HEAD  Findings:  No acute infarct.  No intracranial hemorrhage.  Mild small vessel disease type changes.  No intracranial mass lesion detected on this unenhanced exam.  No hydrocephalus.  Opacification mastoid air cells and middle ear cavities more notable on the right. No obstructing lesions seen in the region of posterior-superior nasopharynx.  Minimal paranasal sinus mucosal thickening.  Mild narrowing upper cervical canal.  Mild exophthalmos.  Major intracranial vascular structures are patent.   IMPRESSION: No acute infarct.  Mild small vessel disease type changes.  Opacification mastoid air cells and middle ear cavities more notable on the right.  MRA HEAD  Findings: Mild to moderate narrowing involving portions of the right internal carotid artery cavernous segment and mild narrowing and irregularity of the portions of the left internal carotid artery cavernous segment.  Small bulge M1 segment left middle cerebral artery.  This appears to be origin of a vessel. Tiny aneurysm not entirely excluded. This is at the limitation of the present exam.  Middle cerebral artery branch vessel irregularity.  Left vertebral artery is dominant.  Mild irregularity right vertebral artery.  Mild irregularity of the basilar artery without high-grade stenosis.  Nonvisualization PICAs.  Fusiform dilated the P1 segment of the left posterior cerebral artery.  Mild narrowing P1 segment right posterior cerebral artery. Moderate to marked stenosis P1-2 segment right posterior cerebral artery.  Posterior cerebral artery distal branch vessel irregularity greater on the right.  IMPRESSION: Intracranial atherosclerotic type changes as detailed above.  Minimal bulge M1 segment left middle cerebral artery may represent origin of a vessel.  Tiny aneurysm not entirely excluded however this is at the limitation of the present exam.  Original Report Authenticated By: STEVEN R. OLSON, M.D.   Dg Chest Port 1 View  07/25/2011  *RADIOLOGY REPORT*  Clinical Data: Congestive heart failure, improving shortness of breath  PORTABLE CHEST - 1 VIEW  Comparison: Portable chest x-ray of 07/24/2011  Findings: There may be slight improvement in the degree of moderate pulmonary vascular congestion.  Cardiomegaly, bilateral effusions, and mild congestion remain.  Median sternotomy sutures are noted.  IMPRESSION: Slight improvement in congestive heart failure pattern.  Bilateral pleural effusions remain.  Original Report Authenticated By: PAUL D. BARRY,  M.D.   Dg Chest Port 1 View  07/24/2011  *RADIOLOGY REPORT*  Clinical Data: Shortness of breath.  PORTABLE CHEST - 1 VIEW  Comparison: 06/29/2011  Findings: Worsening interstitial prominence throughout the lungs, likely interstitial edema.  Mild cardiomegaly.  Small to moderate bilateral pleural effusions, right greater than left, slightly increased since prior study.  Bibasilar atelectasis.  Prior CABG.  IMPRESSION: Worsening interstitial prominence, likely interstitial edema/CHF. Enlarging bilateral effusions.  Original Report Authenticated By: KEVIN G. DOVER, M.D.   Assessment/Plan:  Pt is a 60 y.o. yo female with a PMHx of CAD s/p CABG, sternal osteomyelitis, breast cancer, and chronic systolic heart failure who was admitted on 07/24/2011 for and episode of acute respiratory failure during a hyperbaric oxygen therapy session.   1. Acute respiratory failure: Likely secondary to her acute on chronic systolic heart failure exacerbation. She was taken off the BIPAP and has been maintaining well on [place] since admission.  She states that she started to feel short of breath in the chamber before any other symptoms. She   continues to have some orthopnea and has some decreased breath sounds in the bases today.  Chest x-ray pending.  Patient's with insulin controlled diabetes and recheck chest surgery are at increased risk for pulmonary and CNS oxygen toxicity which can manifest as either seizure or chest pressure, shortness of breath, and transient decreased pulmonary function per Uptod[place].    2. Acute on chronic systolic heart failure exacerbation: She states that she had been feeling mildly more short of breath over the last few weeks but had no weight gain or increase in swelling. She is negative about 2.7L since admission.  She states that she is not on lasix as an outpatient.  3. NSTEMI: Troponins were positive on admission and rose to a peak of 2.25 but have since been declining.  She started in the chamber  about 8 pm and enzymes were elevated about 10 am which means she probably had the NSTEMI secondary to #2 that was exacerbated by the hyperbaric chamber.  EKG done yesterday did show some ST depression in the lateral leads and some T wave inversions in V2 and V3.  She likely could benefit from the cath during this admission.   4. Altered mental status, likely secondary to hypoxia - improved: MRI was negative for acute pathology.  Neurology is following.  Recommended carotid dopplers as well which are non-obstructive.    5. Hyperkalemia: Likely related to the acute respiratory acidosis. K has been trending downward and per the telemetry monitor she does not have any QT elongation. This has resolved as of yesterday.  6.  Diabetes Mellitus:  She had an episode of hypoglycemia yesterday that required a carbohydrate snack.  Likely related to decreased oral intake.  Hypoglycemia is also a consequence of hyperbaric oxygen therapy as well.  We will need to continue to monitor.    7. Hematuria: Will need to be followed. UCx has been drawn.  Length of Stay: 2 days  Patient history and plan of care reviewed with attending, Dr.   PRIBULA,CHRISTOPHER, M.D. 07/26/2011, 6:50 AM  Patient seen with resident, agree with above note.  Patient presented with respiratory arrest, acute on chronic systolic CHF, and elevated cardiac enzymes.  1. CHF: Acute on chronic systolic CHF.  EF 40-45% on last echo a few months ago.  After diuresis, she appears near euvolemic.  CXR is better.  - Continue Coreg.  Will add hydralazine 12.5 tid/Imdur 30 => holding off on ACEI with rather persistent hyperkalemia. - Change to po Lasix 40 mg daily.  - Repeat echo.  2. NSTEMI: ? Demand ischemia in setting of volume overload versus true ACS.  Plan for cardiac cath today.   Effie Janoski 07/26/2011 8:17 AM  

## 2011-07-27 ENCOUNTER — Inpatient Hospital Stay (HOSPITAL_COMMUNITY): Payer: 59

## 2011-07-27 ENCOUNTER — Ambulatory Visit: Payer: Self-pay | Admitting: Cardiothoracic Surgery

## 2011-07-27 DIAGNOSIS — R404 Transient alteration of awareness: Secondary | ICD-10-CM

## 2011-07-27 DIAGNOSIS — G459 Transient cerebral ischemic attack, unspecified: Secondary | ICD-10-CM

## 2011-07-27 LAB — GLUCOSE, CAPILLARY
Glucose-Capillary: 135 mg/dL — ABNORMAL HIGH (ref 70–99)
Glucose-Capillary: 114 mg/dL — ABNORMAL HIGH (ref 70–99)
Glucose-Capillary: 133 mg/dL — ABNORMAL HIGH (ref 70–99)

## 2011-07-27 LAB — BASIC METABOLIC PANEL
BUN: 25 mg/dL — ABNORMAL HIGH (ref 6–23)
Chloride: 98 mEq/L (ref 96–112)
GFR calc Af Amer: 84 mL/min — ABNORMAL LOW (ref >90)
GFR calc non Af Amer: 72 mL/min — ABNORMAL LOW (ref >90)
Potassium: 4.8 mEq/L (ref 3.5–5.1)
Sodium: 135 mEq/L (ref 135–145)

## 2011-07-27 LAB — CBC
HCT: 28.3 % — ABNORMAL LOW (ref 36.0–46.0)
Hemoglobin: 9.1 g/dL — ABNORMAL LOW (ref 12.0–15.0)
MCH: 27.2 pg (ref 26.0–34.0)
MCHC: 32.2 g/dL (ref 30.0–36.0)
MCV: 84.7 fL (ref 78.0–100.0)
Platelets: 144 10*3/uL — ABNORMAL LOW (ref 150–400)
RBC: 3.34 MIL/uL — ABNORMAL LOW (ref 3.87–5.11)
RDW: 15 % (ref 11.5–15.5)
WBC: 5.5 10*3/uL (ref 4.0–10.5)

## 2011-07-27 NOTE — Progress Notes (Signed)
Patient ID: [name], female   DOB: June 11, 1950, 61 y.o.   MRN: [id number]   SUBJECTIVE:  Hemodynamically the patient is stable during the night. Her mental status continues to be abnormal. It remains unclear exactly what the problem is. She is being followed by neurology. The nurses state that she seems to be oriented most of the time. Carotid Doppler was ordered yesterday and is to be done today. Her blood pressure is carefully being kept over 100 systolic.   Filed Vitals:   07/27/11 0330 07/27/11 0400 07/27/11 0500 07/27/11 0600  BP:  104/36 113/50 112/50  Pulse:  81 78 78  Temp: 98.1 F (36.7 C)     TempSrc: Oral     Resp:  16 16 24   Height:      Weight: 162 lb 0.6 oz (73.5 kg)     SpO2:  100% 98% 98%    Intake/Output Summary (Last 24 hours) at 07/27/11 0800 Last data filed at 07/27/11 0600  Gross per 24 hour  Intake    334 ml  Output   2150 ml  Net  -1816 ml    LABS: Basic Metabolic Panel:  Basename 07/27/11 0520 07/26/11 0637  NA 135 134*  K 4.8 5.0  CL 98 97  CO2 28 25  GLUCOSE 132* 147*  BUN 25* 33*  CREATININE 0.85 0.91  CALCIUM 9.3 9.2  MG -- --  PHOS -- --   Liver Function Tests:  [facility] 07/24/11 0944  AST 29  ALT 20  ALKPHOS 102  BILITOT 0.2*  PROT 8.7*  ALBUMIN 3.5   No results found for this basename: LIPASE:2,AMYLASE:2 in the last 72 hours CBC:  Basename 07/27/11 0520 07/25/11 1655 07/24/11 0944  WBC 5.5 11.4* --  NEUTROABS -- -- 8.9*  HGB 9.1* 10.3* --  HCT 28.3* 31.6* --  MCV 84.7 84.3 --  PLT 144* 184 --   Cardiac Enzymes:  Basename 07/25/11 0843 07/25/11 0105 07/24/11 1726  CKTOTAL 102 123 137  CKMB 7.4* 8.2* 8.3*  CKMBINDEX -- -- --  TROPONINI 1.27* 1.53* 2.25*   BNP: No components found with this basename: POCBNP:3 D-Dimer: No results found for this basename: DDIMER:2 in the last 72 hours Hemoglobin A1C:  Basename 07/26/11 0637  HGBA1C 7.0*   Fasting Lipid Panel:  Basename 07/26/11 0637  CHOL 125  HDL 44    LDLCALC 56  TRIG 126  CHOLHDL 2.8  LDLDIRECT --   Thyroid Function Tests: No results found for this basename: TSH,T4TOTAL,FREET3,T3FREE,THYROIDAB in the last 72 hours  RADIOLOGY: Dg Chest 2 View  07/26/2011  *RADIOLOGY REPORT*  Clinical Data: Congestive heart failure.  Weakness.  Shortness of breath.  CHEST - 2 VIEW  Comparison:  Portable upright chest radiograph 07/25/2011.  Findings: Borderline cardiomegaly is stable.  Bilateral pleural effusions are not significantly changed.  The right pleural effusion may be loculated given its lateral and posterior position. Mild pulmonary vascular congestion is stable.  Bibasilar airspace disease is unchanged, likely representing atelectasis.  IMPRESSION:  1.  Stable appearance of bilateral pleural effusions and airspace disease. 2.  The right pleural effusion may be loculated. 3.  Stable pulmonary vascular congestion.  Original Report Authenticated By: Jamesetta Orleans. MATTERN, M.D.   Dg Chest 2 View  06/29/2011  *RADIOLOGY REPORT*  Clinical Data: Sternal osteomyelitis.  CHEST - 2 VIEW 06/29/2011:  Comparison: Portable chest x-ray 04/02/2011 and two-view chest x- ray 03/29/2011, 02/28/2011, 02/12/2011.  Findings: Prior sternotomy, with stable upper normal heart size. A  capped needle was placed on the patient's skin at the site of the open sternal wound anteriorly; no opaque foreign body is identified in this region.  Sternal soft tissue swelling, and periosteal new bone formation involving the lower sternum.  Moderate sized bilateral pleural effusions, left greater than right, not significantly changed. Pulmonary vascularity normal without evidence of pulmonary edema.  No new pulmonary parenchymal abnormalities.  IMPRESSION:  1.  No opaque foreign body in the presternal soft tissues. 2.  Soft tissue swelling anterior to the sternum.  Periosteal new bone involving the sternum consistent with the history of osteomyelitis. 3.  Stable chronic bilateral pleural  effusions, left greater than right.  No new/acute cardiopulmonary disease.  Original Report Authenticated By: Arnell Sieving, M.D.   Ct Head Wo Contrast  07/26/2011  *RADIOLOGY REPORT*  Clinical Data: Somnolence, confusion, slurred speech and altered mental status after cardiac catheterization.  CT HEAD WITHOUT CONTRAST  Technique:  Contiguous axial images were obtained from the base of the skull through the vertex without contrast.  Comparison: MRI of the brain on 07/25/2011.  Findings: No evidence of acute infarction.  There is some contrast in the dural sinuses related to the catheterization procedure.  No evidence of acute intracranial hemorrhage, extra-axial fluid collections, mass effect, hydrocephalus or mass lesion.  The skull is unremarkable.  IMPRESSION: No acute findings by head CT.  Original Report Authenticated By: Reola Calkins, M.D.   Ct Head Wo Contrast  07/24/2011  *RADIOLOGY REPORT*  Clinical Data: Patient unresponsive with right gaze preference.  CT HEAD WITHOUT CONTRAST  Technique:  Contiguous axial images were obtained from the base of the skull through the vertex without contrast.  Comparison: None.  Findings: The brain demonstrates no evidence of hemorrhage, infarction, edema, mass effect, extra-axial fluid collection, hydrocephalus or mass lesion.  The skull is unremarkable.  IMPRESSION: No acute findings by CT of the brain.  Findings were communicated directly to Dr. Fonnie Jarvis.  Original Report Authenticated By: Reola Calkins, M.D.   Mr Maxine Glenn Head Wo Contrast  07/25/2011  *RADIOLOGY REPORT*  Clinical Data:  High blood pressure.  Diabetes.  Hyperlipidemia. Presenting with transient episode of left-sided weakness and right gaze preference.  MRI HEAD WITHOUT CONTRAST MRA HEAD WITHOUT CONTRAST  Technique: Multiplanar, multiecho pulse sequences of the brain and surrounding structures were obtained according to standard protocol without intravenous contrast.  Angiographic images of  the head were obtained using MRA technique without contrast.  Comparison: 07/24/2011 CT.  No comparison MR.  MRI HEAD  Findings:  No acute infarct.  No intracranial hemorrhage.  Mild small vessel disease type changes.  No intracranial mass lesion detected on this unenhanced exam.  No hydrocephalus.  Opacification mastoid air cells and middle ear cavities more notable on the right. No obstructing lesions seen in the region of posterior-superior nasopharynx.  Minimal paranasal sinus mucosal thickening.  Mild narrowing upper cervical canal.  Mild exophthalmos.  Major intracranial vascular structures are patent.  IMPRESSION: No acute infarct.  Mild small vessel disease type changes.  Opacification mastoid air cells and middle ear cavities more notable on the right.  MRA HEAD  Findings: Mild to moderate narrowing involving portions of the right internal carotid artery cavernous segment and mild narrowing and irregularity of the portions of the left internal carotid artery cavernous segment.  Small bulge M1 segment left middle cerebral artery.  This appears to be origin of a vessel. Tiny aneurysm not entirely excluded. This is at the limitation of the  present exam.  Middle cerebral artery branch vessel irregularity.  Left vertebral artery is dominant.  Mild irregularity right vertebral artery.  Mild irregularity of the basilar artery without high-grade stenosis.  Nonvisualization PICAs.  Fusiform dilated the P1 segment of the left posterior cerebral artery.  Mild narrowing P1 segment right posterior cerebral artery. Moderate to marked stenosis P1-2 segment right posterior cerebral artery.  Posterior cerebral artery distal branch vessel irregularity greater on the right.  IMPRESSION: Intracranial atherosclerotic type changes as detailed above.  Minimal bulge M1 segment left middle cerebral artery may represent origin of a vessel.  Tiny aneurysm not entirely excluded however this is at the limitation of the present exam.   Original Report Authenticated By: Fuller Canada, M.D.   Mr Brain Wo Contrast  07/25/2011  *RADIOLOGY REPORT*  Clinical Data:  High blood pressure.  Diabetes.  Hyperlipidemia. Presenting with transient episode of left-sided weakness and right gaze preference.  MRI HEAD WITHOUT CONTRAST MRA HEAD WITHOUT CONTRAST  Technique: Multiplanar, multiecho pulse sequences of the brain and surrounding structures were obtained according to standard protocol without intravenous contrast.  Angiographic images of the head were obtained using MRA technique without contrast.  Comparison: 07/24/2011 CT.  No comparison MR.  MRI HEAD  Findings:  No acute infarct.  No intracranial hemorrhage.  Mild small vessel disease type changes.  No intracranial mass lesion detected on this unenhanced exam.  No hydrocephalus.  Opacification mastoid air cells and middle ear cavities more notable on the right. No obstructing lesions seen in the region of posterior-superior nasopharynx.  Minimal paranasal sinus mucosal thickening.  Mild narrowing upper cervical canal.  Mild exophthalmos.  Major intracranial vascular structures are patent.  IMPRESSION: No acute infarct.  Mild small vessel disease type changes.  Opacification mastoid air cells and middle ear cavities more notable on the right.  MRA HEAD  Findings: Mild to moderate narrowing involving portions of the right internal carotid artery cavernous segment and mild narrowing and irregularity of the portions of the left internal carotid artery cavernous segment.  Small bulge M1 segment left middle cerebral artery.  This appears to be origin of a vessel. Tiny aneurysm not entirely excluded. This is at the limitation of the present exam.  Middle cerebral artery branch vessel irregularity.  Left vertebral artery is dominant.  Mild irregularity right vertebral artery.  Mild irregularity of the basilar artery without high-grade stenosis.  Nonvisualization PICAs.  Fusiform dilated the P1 segment of  the left posterior cerebral artery.  Mild narrowing P1 segment right posterior cerebral artery. Moderate to marked stenosis P1-2 segment right posterior cerebral artery.  Posterior cerebral artery distal branch vessel irregularity greater on the right.  IMPRESSION: Intracranial atherosclerotic type changes as detailed above.  Minimal bulge M1 segment left middle cerebral artery may represent origin of a vessel.  Tiny aneurysm not entirely excluded however this is at the limitation of the present exam.  Original Report Authenticated By: Fuller Canada, M.D.   Dg Chest Port 1 View  07/25/2011  *RADIOLOGY REPORT*  Clinical Data: Congestive heart failure, improving shortness of breath  PORTABLE CHEST - 1 VIEW  Comparison: Portable chest x-ray of 07/24/2011  Findings: There may be slight improvement in the degree of moderate pulmonary vascular congestion.  Cardiomegaly, bilateral effusions, and mild congestion remain.  Median sternotomy sutures are noted.  IMPRESSION: Slight improvement in congestive heart failure pattern.  Bilateral pleural effusions remain.  Original Report Authenticated By: Juline Patch, M.D.   Dg Chest [facility]  1 View  07/24/2011  *RADIOLOGY REPORT*  Clinical Data: Shortness of breath.  PORTABLE CHEST - 1 VIEW  Comparison: 06/29/2011  Findings: Worsening interstitial prominence throughout the lungs, likely interstitial edema.  Mild cardiomegaly.  Small to moderate bilateral pleural effusions, right greater than left, slightly increased since prior study.  Bibasilar atelectasis.  Prior CABG.  IMPRESSION: Worsening interstitial prominence, likely interstitial edema/CHF. Enlarging bilateral effusions.  Original Report Authenticated By: Cyndie Chime, M.D.    PHYSICAL EXAM   The patient responds to my questions. She appears lethargic. Her husband is in the room. He states that sometimes she has mental status changes with very mild medications such as Tylenol. However he feels she is not back to  baseline. She is lying flat in bed with no shortness of breath. There is no jugulovenous distention. Lung exam reveals a few scattered rhonchi. There is no respiratory distress. Her O2 sat is 96%. Cardiac exam reveals S1 and S2. There no clicks or significant murmurs. The abdomen is soft. The cath site in the right groin reveals no hematoma. She has no significant peripheral edema.   TELEMETRY:   I have personally reviewed telemetry. She has normal sinus rhythm.   ASSESSMENT AND PLAN:    *Acute on chronic systolic heart failure    The patient continues to diuresis. Chest x-ray yesterday showed continuation of her pleural effusions. There is question that one of the effusions might be loculated. Renal function remained stable with the diuresis. The plan will be to continue her diuresis watching her renal function.   Essential hypertension, benign     At this time the patient's blood pressure is controlled. We're trying to be sure that her pressure is not lower to much with her current mental status.   DM type 2 with diabetic peripheral neuropathy   Acute respiratory failure    Her respiratory status is improved. She is lying flat in bed and comfortable at this time. She is tolerating this   Altered mental status    Etiology remains unclear. Neurology continues to follow. Carotid Dopplers are to be done today.   NSTEMI (non-ST elevated myocardial infarction)    Catheterization done Jul 26, 2011. 3/4 grafts are open. The target vessel is quite small and it is felt that intervention is not appropriate. Medical therapy is recommended.   Hyperkalemia    Potassium is normal today. No change in therapy.   Cardiomyopathy    Prior echo in January, 2013 revealed an EF of 40-45%. The current echo reveals an EF of 40% although the study is technically quite poor. The ejection fraction estimate in the cath lab is 20-30%. At this point her meds will not be adjusted further. When her mental status is  stable, further decisions will be made about medications for her cardiomyopathy.  Willa Rough 07/27/2011 8:00 AM

## 2011-07-27 NOTE — Progress Notes (Signed)
Subjective:  Patient groggy today. Mental status changes after heart cath yesterday. CT showed no acute abnormality. She has not had any medications that would affect mental status overnight per nursing staff. She is not oriented and does not follow commands. She is groggy and falls asleep unless prompted.  Objective: Current vital signs: BP 122/49  Pulse 81  Temp(Src) 98.1 F (36.7 C) (Oral)  Resp 20  Ht 5' 4.96" (1.65 m)  Wt 73.5 kg (162 lb 0.6 oz)  BMI 27.00 kg/m2  SpO2 100%  LMP 01/05/1996 Vital signs in last 24 hours: Temp:  [98.1 F (36.7 C)-98.4 F (36.9 C)] 98.1 F (36.7 C) (05/31 0800) Pulse Rate:  [63-81] 81  (05/31 0900) Resp:  [15-24] 20  (05/31 0900) BP: (86-122)/(34-53) 122/49 mmHg (05/31 0900) SpO2:  [95 %-100 %] 100 % (05/31 0900) Weight:  [73.5 kg (162 lb 0.6 oz)] 73.5 kg (162 lb 0.6 oz) (05/31 0330)  Intake/Output from previous day: 05/30 0701 - 05/31 0700 In: 357 [I.V.:357] Out: 2150 [Urine:2150] Intake/Output this shift:   Nutritional status: Clear Liquid  Neurologic Exam: Mental Status: Groggy, will awake if spoken to but is not oriented and answers questions in appropriately.  Cranial Nerves: II: visual fields grossly normal, pupils equal, round, reactive to light and accommodation III,IV, VI: ptosis not present, extra-ocular motions inconsistent, but difficult to assess because she has to be prompted to continue to follow commands. V,VII: unable to assess VIII: hearing normal bilaterally XI: trapezius strength/neck flexion strength normal bilaterally XII: unable to assess  Motor: able to move extremities when prompted but strength testing inconsistent Sensory: unable to asse Cerebellar:  Unable to acess Lab Results:  CBC:  Lab 07/27/11 0520 07/25/11 1655 07/24/11 0944  WBC 5.5 11.4* --  NEUTROABS -- -- 8.9*  HGB 9.1* 10.3* --  HCT 28.3* 31.6* --  MCV 84.7 84.3 --  PLT 144* 184 --   Basic Metabolic Panel:  Lab 07/27/11 1610 07/26/11  0637  NA 135 134*  K 4.8 5.0  CL 98 97  CO2 28 25  GLUCOSE 132* 147*  BUN 25* 33*  CREATININE 0.85 0.91  CALCIUM 9.3 9.2  MG -- --  PHOS -- --   Liver Function Tests:  Lab 07/24/11 0944  AST 29  ALT 20  ALKPHOS 102  BILITOT 0.2*  PROT 8.7*  ALBUMIN 3.5   Hemoglobin A1C:  Lab 07/26/11 0637  HGBA1C 7.0*   Fasting Lipid Panel:  Lab 07/26/11 0637  CHOL 125  HDL 44  LDLCALC 56  TRIG 126  CHOLHDL 2.8  LDLDIRECT --    Lab 07/24/11 0944  LABPROT 15.2  INR 1.18     Dg Chest 2 View  07/26/2011  *RADIOLOGY REPORT*  Clinical Data: Congestive heart failure.  Weakness.  Shortness of breath.  CHEST - 2 VIEW  Comparison:  Portable upright chest radiograph 07/25/2011.  Findings: Borderline cardiomegaly is stable.  Bilateral pleural effusions are not significantly changed.  The right pleural effusion may be loculated given its lateral and posterior position. Mild pulmonary vascular congestion is stable.  Bibasilar airspace disease is unchanged, likely representing atelectasis.  IMPRESSION:  1.  Stable appearance of bilateral pleural effusions and airspace disease. 2.  The right pleural effusion may be loculated. 3.  Stable pulmonary vascular congestion.  Original Report Authenticated By: Jamesetta Orleans. MATTERN, M.D.   Ct Head Wo Contrast  07/26/2011  *RADIOLOGY REPORT*  Clinical Data: Somnolence, confusion, slurred speech and altered mental status after cardiac catheterization.  CT HEAD WITHOUT CONTRAST  Technique:  Contiguous axial images were obtained from the base of the skull through the vertex without contrast.  Comparison: MRI of the brain on 07/25/2011.  Findings: No evidence of acute infarction.  There is some contrast in the dural sinuses related to the catheterization procedure.  No evidence of acute intracranial hemorrhage, extra-axial fluid collections, mass effect, hydrocephalus or mass lesion.  The skull is unremarkable.  IMPRESSION: No acute findings by head CT.  Original  Report Authenticated By: Reola Calkins, M.D.   Mr Maxine Glenn Head Wo Contrast  07/25/2011  *RADIOLOGY REPORT*  Clinical Data:  High blood pressure.  Diabetes.  Hyperlipidemia. Presenting with transient episode of left-sided weakness and right gaze preference.  MRI HEAD WITHOUT CONTRAST MRA HEAD WITHOUT CONTRAST  Technique: Multiplanar, multiecho pulse sequences of the brain and surrounding structures were obtained according to standard protocol without intravenous contrast.  Angiographic images of the head were obtained using MRA technique without contrast.  Comparison: 07/24/2011 CT.  No comparison MR.  MRI HEAD  Findings:  No acute infarct.  No intracranial hemorrhage.  Mild small vessel disease type changes.  No intracranial mass lesion detected on this unenhanced exam.  No hydrocephalus.  Opacification mastoid air cells and middle ear cavities more notable on the right. No obstructing lesions seen in the region of posterior-superior nasopharynx.  Minimal paranasal sinus mucosal thickening.  Mild narrowing upper cervical canal.  Mild exophthalmos.  Major intracranial vascular structures are patent.  IMPRESSION: No acute infarct.  Mild small vessel disease type changes.  Opacification mastoid air cells and middle ear cavities more notable on the right.  MRA HEAD  Findings: Mild to moderate narrowing involving portions of the right internal carotid artery cavernous segment and mild narrowing and irregularity of the portions of the left internal carotid artery cavernous segment.  Small bulge M1 segment left middle cerebral artery.  This appears to be origin of a vessel. Tiny aneurysm not entirely excluded. This is at the limitation of the present exam.  Middle cerebral artery branch vessel irregularity.  Left vertebral artery is dominant.  Mild irregularity right vertebral artery.  Mild irregularity of the basilar artery without high-grade stenosis.  Nonvisualization PICAs.  Fusiform dilated the P1 segment of the  left posterior cerebral artery.  Mild narrowing P1 segment right posterior cerebral artery. Moderate to marked stenosis P1-2 segment right posterior cerebral artery.  Posterior cerebral artery distal branch vessel irregularity greater on the right.  IMPRESSION: Intracranial atherosclerotic type changes as detailed above.  Minimal bulge M1 segment left middle cerebral artery may represent origin of a vessel.  Tiny aneurysm not entirely excluded however this is at the limitation of the present exam.  Original Report Authenticated By: Fuller Canada, M.D.   Mr Brain Wo Contrast  07/25/2011  *RADIOLOGY REPORT*  Clinical Data:  High blood pressure.  Diabetes.  Hyperlipidemia. Presenting with transient episode of left-sided weakness and right gaze preference.  MRI HEAD WITHOUT CONTRAST MRA HEAD WITHOUT CONTRAST  Technique: Multiplanar, multiecho pulse sequences of the brain and surrounding structures were obtained according to standard protocol without intravenous contrast.  Angiographic images of the head were obtained using MRA technique without contrast.  Comparison: 07/24/2011 CT.  No comparison MR.  MRI HEAD  Findings:  No acute infarct.  No intracranial hemorrhage.  Mild small vessel disease type changes.  No intracranial mass lesion detected on this unenhanced exam.  No hydrocephalus.  Opacification mastoid air cells and middle ear cavities more  notable on the right. No obstructing lesions seen in the region of posterior-superior nasopharynx.  Minimal paranasal sinus mucosal thickening.  Mild narrowing upper cervical canal.  Mild exophthalmos.  Major intracranial vascular structures are patent.  IMPRESSION: No acute infarct.  Mild small vessel disease type changes.  Opacification mastoid air cells and middle ear cavities more notable on the right.  MRA HEAD  Findings: Mild to moderate narrowing involving portions of the right internal carotid artery cavernous segment and mild narrowing and irregularity of the  portions of the left internal carotid artery cavernous segment.  Small bulge M1 segment left middle cerebral artery.  This appears to be origin of a vessel. Tiny aneurysm not entirely excluded. This is at the limitation of the present exam.  Middle cerebral artery branch vessel irregularity.  Left vertebral artery is dominant.  Mild irregularity right vertebral artery.  Mild irregularity of the basilar artery without high-grade stenosis.  Nonvisualization PICAs.  Fusiform dilated the P1 segment of the left posterior cerebral artery.  Mild narrowing P1 segment right posterior cerebral artery. Moderate to marked stenosis P1-2 segment right posterior cerebral artery.  Posterior cerebral artery distal branch vessel irregularity greater on the right.  IMPRESSION: Intracranial atherosclerotic type changes as detailed above.  Minimal bulge M1 segment left middle cerebral artery may represent origin of a vessel.  Tiny aneurysm not entirely excluded however this is at the limitation of the present exam.  Original Report Authenticated By: Fuller Canada, M.D.    Medications:  Scheduled:   . aspirin  325 mg Oral Daily  . carvedilol  3.125 mg Oral BID  . clarithromycin  500 mg Oral BID WC  . fentaNYL  HAS NOT RECEIVED PER NURSING STAFF    . furosemide  40 mg Oral Daily  . gabapentin  300 mg Oral TID  . heparin      . hydrALAZINE  12.5 mg Oral Q8H  . insulin aspart  0-9 Units Subcutaneous TID WC  . insulin aspart  20 Units Subcutaneous TID WC  . insulin glargine  35 Units Subcutaneous BID  . isosorbide mononitrate  30 mg Oral Daily  . lidocaine      . midazolam      . mulitivitamin with minerals  1 tablet Oral Daily  . naloxone  0.2 mg Intravenous Once  . nitroGLYCERIN      . PARoxetine  20 mg Oral q morning - 10a  . pravastatin  40 mg Oral Daily  . sodium chloride  3 mL Intravenous Q12H  . DISCONTD: sodium chloride  3 mL Intravenous Q12H    Assessment/Plan:  61yo female admitted with left body  weakness with right gaze preference which resolved. After cardiac cath yesterday she has remained lethargic. Carotid dopplers are essentially unchanged from pre-cabg in 12/2010.  1) Acute Mental Status Change  -lethargic after cath with confusion, did receive romazicon   -CT was unremarkable post cath, order MRI head  -she has not received any mental status changing drugs overnight  -Recommend urine and blood cultures.    LOS: 3 days   Guy Franco [facility] Pager [phone]  07/27/2011  9:23 AM

## 2011-07-27 NOTE — Progress Notes (Signed)
07/27/2011 Held PO meds due to patient being lethargic and drowsiness.  VS stable.  Follows directions. Some disorientation.  Unable to focus eyes.  Weaker grips on Rt.  Wash Nienhaus, Linnell Fulling

## 2011-07-27 NOTE — Progress Notes (Signed)
Patient still with altered mental status and varying neuro deficits post cath Carotids show moderate L>R disease Latest brain MRI prob posterior circulation small CVA Urine screen for heavy metals  ordered but suspect diffuse atheroemboli as etiology.  Sternal wound stable

## 2011-07-28 DIAGNOSIS — I639 Cerebral infarction, unspecified: Secondary | ICD-10-CM

## 2011-07-28 DIAGNOSIS — R404 Transient alteration of awareness: Secondary | ICD-10-CM

## 2011-07-28 DIAGNOSIS — I6340 Cerebral infarction due to embolism of unspecified cerebral artery: Secondary | ICD-10-CM

## 2011-07-28 HISTORY — DX: Cerebral infarction, unspecified: I63.9

## 2011-07-28 LAB — CBC
Hemoglobin: 9.9 g/dL — ABNORMAL LOW (ref 12.0–15.0)
Platelets: 140 10*3/uL — ABNORMAL LOW (ref 150–400)
RBC: 3.62 MIL/uL — ABNORMAL LOW (ref 3.87–5.11)
WBC: 5.7 10*3/uL (ref 4.0–10.5)

## 2011-07-28 LAB — BASIC METABOLIC PANEL
CO2: 25 mEq/L (ref 19–32)
Chloride: 98 mEq/L (ref 96–112)
Glucose, Bld: 143 mg/dL — ABNORMAL HIGH (ref 70–99)
Potassium: 4.7 mEq/L (ref 3.5–5.1)
Sodium: 136 mEq/L (ref 135–145)

## 2011-07-28 LAB — GLUCOSE, CAPILLARY
Glucose-Capillary: 142 mg/dL — ABNORMAL HIGH (ref 70–99)
Glucose-Capillary: 184 mg/dL — ABNORMAL HIGH (ref 70–99)

## 2011-07-28 MED ORDER — CARVEDILOL 6.25 MG PO TABS
6.2500 mg | ORAL_TABLET | Freq: Two times a day (BID) | ORAL | Status: DC
  Administered 2011-07-28 – 2011-08-03 (×12): 6.25 mg via ORAL
  Filled 2011-07-28 (×13): qty 1

## 2011-07-28 MED ORDER — ASPIRIN 325 MG PO TABS
325.0000 mg | ORAL_TABLET | Freq: Every day | ORAL | Status: DC
  Administered 2011-07-29 – 2011-08-03 (×6): 325 mg via ORAL
  Filled 2011-07-28 (×6): qty 1

## 2011-07-28 NOTE — Progress Notes (Signed)
Peyton Bottoms, MD, [facility]IM Board Certified in Adult Cardiovascular Medicine,Internal Medicine and Critical Care Medicine      Subjective:    Patient is awake and alert this morning.  She answers appropriately to my questions.  She appears to be in no distress.  Her daughter feels that she is improving as far as her mental status.  She reports no chest pain or shortness of breath.  From a cardiac standpoint she appears to be stable.   Objective:   Weight Range:  Vital Signs:   Temp:  [97.6 F (36.4 C)-98.4 F (36.9 C)] 97.9 F (36.6 C) (06/01 0800) Pulse Rate:  [67-94] 78  (06/01 0800) Resp:  [14-21] 14  (06/01 0800) BP: (103-121)/(43-57) 119/57 mmHg (06/01 0800) SpO2:  [93 %-100 %] 100 % (06/01 0800) Weight:  [158 lb 4.6 oz (71.8 kg)] 158 lb 4.6 oz (71.8 kg) (06/01 0500)    Weight change: Filed Weights   07/26/11 0400 07/27/11 0330 07/28/11 0500  Weight: 161 lb 13.1 oz (73.4 kg) 162 lb 0.6 oz (73.5 kg) 158 lb 4.6 oz (71.8 kg)    Intake/Output:   Intake/Output Summary (Last 24 hours) at 07/28/11 1010 Last data filed at 07/28/11 0000  Gross per 24 hour  Intake    540 ml  Output    900 ml  Net   -360 ml     Physical Exam: General: Oriented white female but lethargic HEENT: Stable Lungs: Clear breath sounds bilaterally without crackles Heart: Regular rate and rhythm with no pathological murmurs Abdomen: Soft Extremities exam; pneumatic compression stockings in place tenderness on palpating the legs but no definite Homans sign Peripheral pulses appear normal.  Telemetry: normal sinus rhythm Labs: Basic Metabolic Panel:  Lab 07/28/11 1610 07/27/11 0520 07/26/11 0637 07/25/11 0557 07/24/11 1725 07/24/11 1004 07/24/11 0944  NA 136 135 134* 134* -- 140 --  K 4.7 4.8 5.0 5.1 5.6* -- --  CL 98 98 97 100 -- 108 --  CO2 25 28 25 20  -- -- 24  GLUCOSE 143* 132* 147* 266* -- 272* --  BUN 27* 25* 33* 35* -- 25* --  CREATININE 0.74 0.85 0.91 1.11* -- 1.30* --  CALCIUM  9.3 9.3 9.2 -- -- -- --  MG -- -- -- -- -- -- --  PHOS -- -- -- -- -- -- --    Liver Function Tests:  Lab 07/24/11 0944  AST 29  ALT 20  ALKPHOS 102  BILITOT 0.2*  PROT 8.7*  ALBUMIN 3.5   No results found for this basename: LIPASE:5,AMYLASE:5 in the last 168 hours No results found for this basename: AMMONIA:3 in the last 168 hours  CBC:  Lab 07/28/11 0530 07/27/11 0520 07/25/11 1655 07/25/11 0557 07/24/11 1004 07/24/11 0944  WBC 5.7 5.5 11.4* 7.6 -- 17.5*  NEUTROABS -- -- -- -- -- 8.9*  HGB 9.9* 9.1* 10.3* 8.8* 13.6 --  HCT 30.0* 28.3* 31.6* 27.1* 40.0 --  MCV 82.9 84.7 84.3 83.4 -- 85.3  PLT 140* 144* 184 134* -- 330    Cardiac Enzymes:  Lab 07/25/11 0843 07/25/11 0105 07/24/11 1726 07/24/11 0944  CKTOTAL 102 123 137 143  CKMB 7.4* 8.2* 8.3* 6.0*  CKMBINDEX -- -- -- --  TROPONINI 1.27* 1.53* 2.25* 1.31*     BNP: BNP (last 3 results)  Basename 07/24/11 0953 01/29/11 1644 01/25/11 0621  PROBNP 3723.0* 12246.0* 3283.0*    ABG    Component Value Date/Time   PHART 7.082* 07/24/2011 1042  PCO2ART 89.2* 07/24/2011 1042   PO2ART 123.0* 07/24/2011 1042   HCO3 26.6* 07/24/2011 1042   TCO2 29 07/24/2011 1042   ACIDBASEDEF 5.0* 07/24/2011 1042   O2SAT 96.0 07/24/2011 1042     Other results: EKG: not obtained today Imaging: Ct Head Wo Contrast  07/26/2011  *RADIOLOGY REPORT*  Clinical Data: Somnolence, confusion, slurred speech and altered mental status after cardiac catheterization.  CT HEAD WITHOUT CONTRAST  Technique:  Contiguous axial images were obtained from the base of the skull through the vertex without contrast.  Comparison: MRI of the brain on 07/25/2011.  Findings: No evidence of acute infarction.  There is some contrast in the dural sinuses related to the catheterization procedure.  No evidence of acute intracranial hemorrhage, extra-axial fluid collections, mass effect, hydrocephalus or mass lesion.  The skull is unremarkable.  IMPRESSION: No acute findings  by head CT.  Original Report Authenticated By: Reola Calkins, M.D.   Mr Brain Wo Contrast  07/27/2011  *RADIOLOGY REPORT*  Clinical Data: 61 year old female with slurred speech, confusion. Cardiac catheterization yesterday.  Altered mental status.  MRI HEAD WITHOUT CONTRAST  Technique:  Multiplanar, multiecho pulse sequences of the brain and surrounding structures were obtained according to standard protocol without intravenous contrast.  Comparison: 07/25/2011.  Findings: The examination had to be discontinued prior to completion due to patient condition.  . Confluent restricted diffusion extending from the central and medial thalamus into the ventral mid brain (series 5 images 13 - 16).  Associated T2 hyperintensity.  No mass effect or evidence of hemorrhage.  Diffusion elsewhere is within normal limits.  No ventriculomegaly. No midline shift, mass effect, or evidence of mass lesion.  Major intracranial vascular flow voids are stable.  Gray-white matter signal outside of the area of acute ischemia is stable.  Continued right greater than left mastoid effusion.  Fluid layering in the pharynx. Visualized orbit soft tissues are within normal limits.  Negative visualized pituitary, cervicomedullary junction, and cervical spine.  Normal bone marrow signal.  IMPRESSION: 1.  Acute left PCA small vessel infarct involving the thalamus and ventral mid brain.  No mass effect or hemorrhage. 2.  Otherwise stable since 07/25/2011.  Original Report Authenticated By: Harley Hallmark, M.D.      Medications:     Scheduled Medications:    . aspirin  325 mg Oral Daily  . carvedilol  3.125 mg Oral BID  . clarithromycin  500 mg Oral BID WC  . furosemide  40 mg Oral Daily  . gabapentin  300 mg Oral TID  . hydrALAZINE  12.5 mg Oral Q8H  . insulin aspart  0-9 Units Subcutaneous TID WC  . insulin aspart  20 Units Subcutaneous TID WC  . insulin glargine  35 Units Subcutaneous BID  . isosorbide mononitrate  30 mg  Oral Daily  . mulitivitamin with minerals  1 tablet Oral Daily  . naloxone  0.2 mg Intravenous Once  . PARoxetine  20 mg Oral q morning - 10a  . pravastatin  40 mg Oral Daily  . sodium chloride  3 mL Intravenous Q12H     Infusions:     PRN Medications:  sodium chloride, acetaminophen, ALPRAZolam, ondansetron (ZOFRAN) IV, sodium chloride, traMADol   Assessment:   #50mental status changes likely secondary to diffuse atheroemboli Questionable secondary to hyperbaric chamber versus atheroemboli secondary to cardiac catheterization #2 status post acute on chronic systolic heart failure #3 pleural effusions #4 hypertension #5 diabetes mellitus with peripheral neuropathy #6 status post acute  respiratory failure #7 status post non-ST elevation myocardial infarction, status post catheterization Jul 26, 2011 three out of four grafts are open.  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     Patient Active Problem List  Diagnoses  . HYPERLIPIDEMIA-MIXED  . DEPRESSION  . Essential hypertension, benign  . FATTY LIVER DISEASE  . Coronary atherosclerosis of native coronary artery  . Thrombocytopenia  . Carotid stenosis  . Atrial fibrillation  . Acute on chronic systolic heart failure  . DM type 2 with diabetic peripheral neuropathy  . Splenomegaly  . Acute respiratory failure  . Altered mental status  . NSTEMI (non-ST elevated myocardial infarction)  . Hyperkalemia  . Cardiomyopathy      Plan/Discussion:    Clinically the patient appears improving.  This is a first day I see her but her mental status although somewhat retarded lethargic she is  quite appropriate and is oriented.  She has good strength in her upper and lower extremities. The patient's blood sugars have been running low and I discontinued her Lantus insulin.  She will remain on a sliding scale but has received very little. There appears to be no infection her blood cultures have been negative. She is anemic and this is to be followed closely.  Renal function is stable. I increased the patient's carvedilol in the setting of her LV dysfunction. We will check a followup BNP level in the morning.  Length of Stay: 4   Alvin Critchley [facility] 07/28/2011, 10:10 AM

## 2011-07-28 NOTE — Progress Notes (Signed)
Subjective: No complaints other than intermittent blurring of vision, and she feels tired. No focal weakness no numbness.  Objective: Current vital signs: BP 119/57  Pulse 78  Temp(Src) 97.9 F (36.6 C) (Oral)  Resp 14  Ht 5' 4.96" (1.65 m)  Wt 71.8 kg (158 lb 4.6 oz)  BMI 26.37 kg/m2  SpO2 100%  LMP 01/05/1996  Neurologic Exam: Alert, oriented x3. No receptive or expressive aphasia. Cranial nerves normal including extraocular movements which were full and conjugate. Strength was normal throughout including no pronator drift of right nor left upper extremity. Coordination was normal bilaterally. No sensory abnormality.  Studies/Results: Ct Head Wo Contrast  07/26/2011  *RADIOLOGY REPORT*  Clinical Data: Somnolence, confusion, slurred speech and altered mental status after cardiac catheterization.  CT HEAD WITHOUT CONTRAST  Technique:  Contiguous axial images were obtained from the base of the skull through the vertex without contrast.  Comparison: MRI of the brain on 07/25/2011.  Findings: No evidence of acute infarction.  There is some contrast in the dural sinuses related to the catheterization procedure.  No evidence of acute intracranial hemorrhage, extra-axial fluid collections, mass effect, hydrocephalus or mass lesion.  The skull is unremarkable.  IMPRESSION: No acute findings by head CT.  Original Report Authenticated By: Reola Calkins, M.D.   Mr Brain Wo Contrast  07/27/2011  *RADIOLOGY REPORT*  Clinical Data: 61 year old female with slurred speech, confusion. Cardiac catheterization yesterday.  Altered mental status.  MRI HEAD WITHOUT CONTRAST  Technique:  Multiplanar, multiecho pulse sequences of the brain and surrounding structures were obtained according to standard protocol without intravenous contrast.  Comparison: 07/25/2011.  Findings: The examination had to be discontinued prior to completion due to patient condition.  . Confluent restricted diffusion extending from  the central and medial thalamus into the ventral mid brain (series 5 images 13 - 16).  Associated T2 hyperintensity.  No mass effect or evidence of hemorrhage.  Diffusion elsewhere is within normal limits.  No ventriculomegaly. No midline shift, mass effect, or evidence of mass lesion.  Major intracranial vascular flow voids are stable.  Gray-white matter signal outside of the area of acute ischemia is stable.  Continued right greater than left mastoid effusion.  Fluid layering in the pharynx. Visualized orbit soft tissues are within normal limits.  Negative visualized pituitary, cervicomedullary junction, and cervical spine.  Normal bone marrow signal.  IMPRESSION: 1.  Acute left PCA small vessel infarct involving the thalamus and ventral mid brain.  No mass effect or hemorrhage. 2.  Otherwise stable since 07/25/2011.  Original Report Authenticated By: Harley Hallmark, M.D.    Medications:  Scheduled:   . aspirin  325 mg Oral Daily  . carvedilol  6.25 mg Oral BID  . clarithromycin  500 mg Oral BID WC  . furosemide  40 mg Oral Daily  . gabapentin  300 mg Oral TID  . hydrALAZINE  12.5 mg Oral Q8H  . insulin aspart  0-9 Units Subcutaneous TID WC  . isosorbide mononitrate  30 mg Oral Daily  . mulitivitamin with minerals  1 tablet Oral Daily  . naloxone  0.2 mg Intravenous Once  . PARoxetine  20 mg Oral q morning - 10a  . pravastatin  40 mg Oral Daily  . sodium chloride  3 mL Intravenous Q12H  . DISCONTD: carvedilol  3.125 mg Oral BID  . DISCONTD: insulin aspart  20 Units Subcutaneous TID WC  . DISCONTD: insulin glargine  35 Units Subcutaneous BID   UJW:[id number] chloride, acetaminophen,  ALPRAZolam, ondansetron (ZOFRAN) IV, sodium chloride, traMADol  Assessment/Plan: Acute left thalamic and ventral midbrain small vessel ischemic stroke. Patient has no demonstrable focal deficits clinically as a result of her acute stroke. Mental status changes were most likely related to medications and have  resolved.  Plan: 1. Continue aspirin 325 mg per day. Patient is allergic to Plavix. I will leave it to the Stroke team to decide if she remained on aspirin will be switched to Aggrenox. 2. Hemoglobin A1c and fasting lipid panel in the a.m. 3. Physical therapy and occupational therapy evaluations.  C.R. Roseanne Reno, MD [facility] [phone]  07/28/2011  11:00 AM

## 2011-07-29 DIAGNOSIS — R404 Transient alteration of awareness: Secondary | ICD-10-CM

## 2011-07-29 DIAGNOSIS — I6340 Cerebral infarction due to embolism of unspecified cerebral artery: Secondary | ICD-10-CM

## 2011-07-29 LAB — CBC
Hemoglobin: 10 g/dL — ABNORMAL LOW (ref 12.0–15.0)
MCH: 27.7 pg (ref 26.0–34.0)
RBC: 3.61 MIL/uL — ABNORMAL LOW (ref 3.87–5.11)

## 2011-07-29 LAB — HEMOGLOBIN A1C
Hgb A1c MFr Bld: 7.1 % — ABNORMAL HIGH
Mean Plasma Glucose: 157 mg/dL — ABNORMAL HIGH

## 2011-07-29 LAB — PRO B NATRIURETIC PEPTIDE: Pro B Natriuretic peptide (BNP): 1204 pg/mL — ABNORMAL HIGH (ref 0–125)

## 2011-07-29 LAB — LIPID PANEL
Cholesterol: 134 mg/dL (ref 0–200)
Triglycerides: 143 mg/dL (ref <150)

## 2011-07-29 LAB — GLUCOSE, CAPILLARY
Glucose-Capillary: 183 mg/dL — ABNORMAL HIGH (ref 70–99)
Glucose-Capillary: 200 mg/dL — ABNORMAL HIGH (ref 70–99)

## 2011-07-29 NOTE — Progress Notes (Signed)
Peyton Bottoms, MD, [facility] ABIM Board Certified in Adult Cardiovascular Medicine,Internal Medicine and Critical Care Medicine      Subjective:    Stable. No SOB. No SCP. Residual visual problems    Objective:   Weight Range:  Vital Signs:   Temp:  [98.4 F (36.9 C)-98.7 F (37.1 C)] 98.6 F (37 C) (06/02 0700) Pulse Rate:  [75-89] 79  (06/02 0800) BP: (99-120)/(45-58) 107/48 mmHg (06/02 0800) SpO2:  [92 %-100 %] 95 % (06/02 0800) Weight:  [158 lb 1.1 oz (71.7 kg)] 158 lb 1.1 oz (71.7 kg) (06/02 0500)    Weight change: Filed Weights   07/27/11 0330 07/28/11 0500 07/29/11 0500  Weight: 162 lb 0.6 oz (73.5 kg) 158 lb 4.6 oz (71.8 kg) 158 lb 1.1 oz (71.7 kg)    Intake/Output:   Intake/Output Summary (Last 24 hours) at 07/29/11 1026 Last data filed at 07/29/11 0600  Gross per 24 hour  Intake    600 ml  Output    950 ml  Net   -350 ml     Physical Exam: General:no distress Lungs clear Cor RRR, no murmrur S1/S2 nl Ext:no edema    Telemetry:NSR  Labs: Basic Metabolic Panel:  Lab 07/28/11 1610 07/27/11 0520 07/26/11 0637 07/25/11 0557 07/24/11 1725 07/24/11 1004 07/24/11 0944  NA 136 135 134* 134* -- 140 --  K 4.7 4.8 5.0 5.1 5.6* -- --  CL 98 98 97 100 -- 108 --  CO2 25 28 25 20  -- -- 24  GLUCOSE 143* 132* 147* 266* -- 272* --  BUN 27* 25* 33* 35* -- 25* --  CREATININE 0.74 0.85 0.91 1.11* -- 1.30* --  CALCIUM 9.3 9.3 9.2 -- -- -- --  MG -- -- -- -- -- -- --  PHOS -- -- -- -- -- -- --    Liver Function Tests:  Lab 07/24/11 0944  AST 29  ALT 20  ALKPHOS 102  BILITOT 0.2*  PROT 8.7*  ALBUMIN 3.5   No results found for this basename: LIPASE:5,AMYLASE:5 in the last 168 hours No results found for this basename: AMMONIA:3 in the last 168 hours  CBC:  Lab 07/29/11 0618 07/28/11 0530 07/27/11 0520 07/25/11 1655 07/25/11 0557 07/24/11 0944  WBC 4.6 5.7 5.5 11.4* 7.6 --  NEUTROABS -- -- -- -- -- 8.9*  HGB 10.0* 9.9* 9.1* 10.3* 8.8* --  HCT 29.5*  30.0* 28.3* 31.6* 27.1* --  MCV 81.7 82.9 84.7 84.3 83.4 --  PLT 152 140* 144* 184 134* --    Cardiac Enzymes:  Lab 07/25/11 0843 07/25/11 0105 07/24/11 1726 07/24/11 0944  CKTOTAL 102 123 137 143  CKMB 7.4* 8.2* 8.3* 6.0*  CKMBINDEX -- -- -- --  TROPONINI 1.27* 1.53* 2.25* 1.31*     BNP: BNP (last 3 results)  Basename 07/29/11 0618 07/24/11 0953 01/29/11 1644  PROBNP 1204.0* 3723.0* 12246.0*    ABG    Component Value Date/Time   PHART 7.082* 07/24/2011 1042   PCO2ART 89.2* 07/24/2011 1042   PO2ART 123.0* 07/24/2011 1042   HCO3 26.6* 07/24/2011 1042   TCO2 29 07/24/2011 1042   ACIDBASEDEF 5.0* 07/24/2011 1042   O2SAT 96.0 07/24/2011 1042     Other results: EKG:NA Imaging: Mr Brain Wo Contrast  07/27/2011  *RADIOLOGY REPORT*  Clinical Data: 61 year old female with slurred speech, confusion. Cardiac catheterization yesterday.  Altered mental status.  MRI HEAD WITHOUT CONTRAST  Technique:  Multiplanar, multiecho pulse sequences of the brain and surrounding structures were obtained  according to standard protocol without intravenous contrast.  Comparison: 07/25/2011.  Findings: The examination had to be discontinued prior to completion due to patient condition.  . Confluent restricted diffusion extending from the central and medial thalamus into the ventral mid brain (series 5 images 13 - 16).  Associated T2 hyperintensity.  No mass effect or evidence of hemorrhage.  Diffusion elsewhere is within normal limits.  No ventriculomegaly. No midline shift, mass effect, or evidence of mass lesion.  Major intracranial vascular flow voids are stable.  Gray-white matter signal outside of the area of acute ischemia is stable.  Continued right greater than left mastoid effusion.  Fluid layering in the pharynx. Visualized orbit soft tissues are within normal limits.  Negative visualized pituitary, cervicomedullary junction, and cervical spine.  Normal bone marrow signal.  IMPRESSION: 1.  Acute left PCA  small vessel infarct involving the thalamus and ventral mid brain.  No mass effect or hemorrhage. 2.  Otherwise stable since 07/25/2011.  Original Report Authenticated By: Harley Hallmark, M.D.      Medications:     Scheduled Medications:    . aspirin  325 mg Oral Daily  . carvedilol  6.25 mg Oral BID  . clarithromycin  500 mg Oral BID WC  . furosemide  40 mg Oral Daily  . gabapentin  300 mg Oral TID  . hydrALAZINE  12.5 mg Oral Q8H  . insulin aspart  0-9 Units Subcutaneous TID WC  . isosorbide mononitrate  30 mg Oral Daily  . mulitivitamin with minerals  1 tablet Oral Daily  . naloxone  0.2 mg Intravenous Once  . PARoxetine  20 mg Oral q morning - 10a  . pravastatin  40 mg Oral Daily  . sodium chloride  3 mL Intravenous Q12H  . DISCONTD: aspirin  325 mg Oral Daily  . DISCONTD: carvedilol  3.125 mg Oral BID  . DISCONTD: insulin aspart  20 Units Subcutaneous TID WC  . DISCONTD: insulin glargine  35 Units Subcutaneous BID     Infusions:     PRN Medications:  sodium chloride, acetaminophen, ALPRAZolam, ondansetron (ZOFRAN) IV, sodium chloride, traMADol   Assessment:  #1Acute left thalamic and ventral midbrain small vessel ischemic stroke Questionable secondary to hyperbaric chamber versus atheroemboli secondary to cardiac catheterization  #2 status post acute on chronic systolic heart failure  #3 pleural effusions  #4 hypertension  #5 diabetes mellitus with peripheral neuropathy  #6 status post acute respiratory failure  #7 status post non-ST elevation myocardial infarction, status post catheterization Jul 26, 2011 three out of four grafts are open. 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    Patient Active Problem List  Diagnoses  . HYPERLIPIDEMIA-MIXED  . DEPRESSION  . Essential hypertension, benign  . FATTY LIVER DISEASE  . Coronary atherosclerosis of native coronary artery  . Thrombocytopenia  . Carotid stenosis  . Atrial fibrillation  . Acute on chronic systolic heart failure  . DM type 2 with diabetic peripheral neuropathy  . Splenomegaly  . Acute respiratory failure  . Altered mental status  . NSTEMI (non-ST elevated myocardial infarction)  . Hyperkalemia  . Cardiomyopathy      Plan/Discussion:    BNP markedly  improved. Tolerating increase coreg. Patient can be transferred to telemetry Cardiac status stable. Neurology following     Length of Stay: [address] [place] 07/29/2011, 10:26 AM

## 2011-07-29 NOTE — Progress Notes (Signed)
Stroke Team Progress Note Subjective: Patient still complains that her vision seems off, as she sees double at times. Per her nurse, her cognition seems to be improving.  Objective: Vital signs in last 24 hours: Temp:  [98.4 F (36.9 C)-98.7 F (37.1 C)] 98.6 F (37 C) (06/02 0700) Pulse Rate:  [75-89] 79  (06/02 0800) BP: (99-120)/(45-58) 107/48 mmHg (06/02 0800) SpO2:  [92 %-100 %] 95 % (06/02 0800) Weight:  [71.7 kg (158 lb 1.1 oz)] 71.7 kg (158 lb 1.1 oz) (06/02 0500) Diet: Carb Control  Intake/Output from previous day: 06/01 0701 - 06/02 0700 In: 840 [P.O.:840] Out: 950 [Urine:950] Intake/Output this shift:      Neurological Exam Mental status: Awake, alert, and oriented to self, [facility], June 2 (thought year was 2014). Attention and concentration within normal limits. Speech fluent and without dysarthria. Naming and repetition intact. Cranial nerve: Visual fields intact. PERRL. Has dysconjugate gaze; right eye appears to lag behind slightly, unable to abduct right eye all the way when looking to the right, unable to look all the way up with right eye. Facial sensation grossly intact. Face symmetrical. Hearing grossly intact bilaterally. Palate elevated midline. Full shoulder shurg bilaterally. Tongue protrudes midline. Sensory: Intact to light touch throughout. Motor: No pronator drift. 5/5 bilateral deltoid, triceps, biceps, hand grip. Lower extremity exam limited by effort due to pain. 4/5 bilateral iliopsoas, 4/5 knee extension/flexion, 4+/5 foot dorsi/plantarflexion.  Reflexes: 1+ throughout. Toes downgoing bilaterally. Cerebellar: Finger-to-nose intact bilaterally.  Lab Results: CBG (last 3)   Basename 07/29/11 0747 07/28/11 2148 07/28/11 1556  GLUCAP 183* 190* 184*    Studies: Mr Brain Wo Contrast  07/27/2011  *RADIOLOGY REPORT*  Clinical Data: 61 year old female with slurred speech, confusion. Cardiac catheterization yesterday.  Altered mental status.   MRI HEAD WITHOUT CONTRAST  Technique:  Multiplanar, multiecho pulse sequences of the brain and surrounding structures were obtained according to standard protocol without intravenous contrast.  Comparison: 07/25/2011.  Findings: The examination had to be discontinued prior to completion due to patient condition.  . Confluent restricted diffusion extending from the central and medial thalamus into the ventral mid brain (series 5 images 13 - 16).  Associated T2 hyperintensity.  No mass effect or evidence of hemorrhage.  Diffusion elsewhere is within normal limits.  No ventriculomegaly. No midline shift, mass effect, or evidence of mass lesion.  Major intracranial vascular flow voids are stable.  Gray-white matter signal outside of the area of acute ischemia is stable.  Continued right greater than left mastoid effusion.  Fluid layering in the pharynx. Visualized orbit soft tissues are within normal limits.  Negative visualized pituitary, cervicomedullary junction, and cervical spine.  Normal bone marrow signal.  IMPRESSION: 1.  Acute left PCA small vessel infarct involving the thalamus and ventral mid brain.  No mass effect or hemorrhage. 2.  Otherwise stable since 07/25/2011.  Original Report Authenticated By: [name], M.D.    Assessment: 61 year-old female with acute left thalamic and ventral midbrain small vessel ischemic stroke, with symptoms occurring after cardiac catheterization. Patient continues to have some visual complaints and does have a dysconjugate gaze with her right eye lagging behind the left eye, upward gaze palsy of the right eye, and rightward gaze palsy of the right eye. These symptoms are consistent with her midbrain lesion.  Plan:  Left thalamic/ventral midbrain stroke:  -Continue aspirin 325 mg daily for anti-platelet therapy. Patient is allergic to Plavix (gets hives). At this time, would favor continuing with  aspirin as opposed to starting Aggrenox, given potential side  effect profile of Aggrenox. -Hemoglobin A1c pending -Fasting lipid panel - LDL 69, continue statin  -Physical therapy and occupational therapy consult for her visual complaints and weakness   DVT Prophylaxis: SCDs/TEDs Activity: carb modified  LOS: Day #5  Williams Che, MD Pager: [phone]

## 2011-07-30 ENCOUNTER — Encounter (HOSPITAL_BASED_OUTPATIENT_CLINIC_OR_DEPARTMENT_OTHER): Payer: 59

## 2011-07-30 ENCOUNTER — Inpatient Hospital Stay (HOSPITAL_COMMUNITY): Payer: 59

## 2011-07-30 DIAGNOSIS — Z951 Presence of aortocoronary bypass graft: Secondary | ICD-10-CM | POA: Insufficient documentation

## 2011-07-30 DIAGNOSIS — I6340 Cerebral infarction due to embolism of unspecified cerebral artery: Secondary | ICD-10-CM

## 2011-07-30 DIAGNOSIS — I639 Cerebral infarction, unspecified: Secondary | ICD-10-CM

## 2011-07-30 LAB — CULTURE, BLOOD (ROUTINE X 2): Culture  Setup Time: 201305281429

## 2011-07-30 LAB — GLUCOSE, CAPILLARY
Glucose-Capillary: 179 mg/dL — ABNORMAL HIGH (ref 70–99)
Glucose-Capillary: 207 mg/dL — ABNORMAL HIGH (ref 70–99)
Glucose-Capillary: 218 mg/dL — ABNORMAL HIGH (ref 70–99)
Glucose-Capillary: 225 mg/dL — ABNORMAL HIGH (ref 70–99)
Glucose-Capillary: 326 mg/dL — ABNORMAL HIGH (ref 70–99)

## 2011-07-30 LAB — URINE CULTURE

## 2011-07-30 LAB — CBC
Platelets: 159 10*3/uL (ref 150–400)
RBC: 3.6 MIL/uL — ABNORMAL LOW (ref 3.87–5.11)
WBC: 5 10*3/uL (ref 4.0–10.5)

## 2011-07-30 LAB — PRO B NATRIURETIC PEPTIDE: Pro B Natriuretic peptide (BNP): 1134 pg/mL — ABNORMAL HIGH (ref 0–125)

## 2011-07-30 NOTE — Plan of Care (Signed)
Problem: Consults Goal: Heart Failure Patient Education (See Patient Education module for education specifics.)  Outcome: Progressing Booklet given to pt and husband went over daily weights and 2 gm na diet

## 2011-07-30 NOTE — Progress Notes (Addendum)
Patient ID: [name], female   DOB: 10-02-1950, 61 y.o.   MRN: [id number]   SUBJECTIVE    Patient continues to improve. She's not having any chest pain or shortness of breath. Her husband is in the room and agrees that she is definitely improving but still not back to baseline. The patient is improving from her stroke. Her heart failure is stabilizing. Earlier in the admission there had been question of hematuria and a urine culture was sent. Gone back to try to review all of the data. It appears that Klebsiella was grown. I do not see that an antibiotic was started for this. She has been on Biaxin since admission. I assume this was for her presentation with respiratory failure. She is afebrile.  I have spent a prolonged period of time reviewing all of the problems despite excellent prior notes. It has been difficult to determine the approach to her Klebsiella in the urine.  Filed Vitals:   07/30/11 0700 07/30/11 0800 07/30/11 0900 07/30/11 1008  BP: 108/53 120/60 110/51 111/51  Pulse: 72 80 80 76  Temp:  97.6 F (36.4 C)    TempSrc:  Oral    Resp:  16    Height:      Weight:      SpO2: 93% 96% 97%     Intake/Output Summary (Last 24 hours) at 07/30/11 1059 Last data filed at 07/30/11 0900  Gross per 24 hour  Intake    550 ml  Output    400 ml  Net    150 ml    LABS: Basic Metabolic Panel:  Basename 07/28/11 0530  NA 136  K 4.7  CL 98  CO2 25  GLUCOSE 143*  BUN 27*  CREATININE 0.74  CALCIUM 9.3  MG --  PHOS --   Liver Function Tests: No results found for this basename: AST:2,ALT:2,ALKPHOS:2,BILITOT:2,PROT:2,ALBUMIN:2 in the last 72 hours No results found for this basename: LIPASE:2,AMYLASE:2 in the last 72 hours CBC:  Basename 07/30/11 0530 07/29/11 0618  WBC 5.0 4.6  NEUTROABS -- --  HGB 9.9* 10.0*  HCT 29.3* 29.5*  MCV 81.4 81.7  PLT 159 152   Cardiac Enzymes: No results found for this basename: CKTOTAL:3,CKMB:3,CKMBINDEX:3,TROPONINI:3 in the last 72  hours BNP: No components found with this basename: POCBNP:3 D-Dimer: No results found for this basename: DDIMER:2 in the last 72 hours Hemoglobin A1C:  Basename 07/29/11 0618  HGBA1C 7.1*   Fasting Lipid Panel:  Basename 07/29/11 0618  CHOL 134  HDL 36*  LDLCALC 69  TRIG 409  CHOLHDL 3.7  LDLDIRECT --   Thyroid Function Tests: No results found for this basename: TSH,T4TOTAL,FREET3,T3FREE,THYROIDAB in the last 72 hours  RADIOLOGY: Dg Chest 2 View  07/26/2011  *RADIOLOGY REPORT*  Clinical Data: Congestive heart failure.  Weakness.  Shortness of breath.  CHEST - 2 VIEW  Comparison:  Portable upright chest radiograph 07/25/2011.  Findings: Borderline cardiomegaly is stable.  Bilateral pleural effusions are not significantly changed.  The right pleural effusion may be loculated given its lateral and posterior position. Mild pulmonary vascular congestion is stable.  Bibasilar airspace disease is unchanged, likely representing atelectasis.  IMPRESSION:  1.  Stable appearance of bilateral pleural effusions and airspace disease. 2.  The right pleural effusion may be loculated. 3.  Stable pulmonary vascular congestion.  Original Report Authenticated By: Jamesetta Orleans. MATTERN, M.D.   Ct Head Wo Contrast  07/26/2011  *RADIOLOGY REPORT*  Clinical Data: Somnolence, confusion, slurred speech and altered mental status  after cardiac catheterization.  CT HEAD WITHOUT CONTRAST  Technique:  Contiguous axial images were obtained from the base of the skull through the vertex without contrast.  Comparison: MRI of the brain on 07/25/2011.  Findings: No evidence of acute infarction.  There is some contrast in the dural sinuses related to the catheterization procedure.  No evidence of acute intracranial hemorrhage, extra-axial fluid collections, mass effect, hydrocephalus or mass lesion.  The skull is unremarkable.  IMPRESSION: No acute findings by head CT.  Original Report Authenticated By: Reola Calkins,  M.D.   Ct Head Wo Contrast  07/24/2011  *RADIOLOGY REPORT*  Clinical Data: Patient unresponsive with right gaze preference.  CT HEAD WITHOUT CONTRAST  Technique:  Contiguous axial images were obtained from the base of the skull through the vertex without contrast.  Comparison: None.  Findings: The brain demonstrates no evidence of hemorrhage, infarction, edema, mass effect, extra-axial fluid collection, hydrocephalus or mass lesion.  The skull is unremarkable.  IMPRESSION: No acute findings by CT of the brain.  Findings were communicated directly to Dr. Fonnie Jarvis.  Original Report Authenticated By: Reola Calkins, M.D.   Mr Maxine Glenn Head Wo Contrast  07/25/2011  *RADIOLOGY REPORT*  Clinical Data:  High blood pressure.  Diabetes.  Hyperlipidemia. Presenting with transient episode of left-sided weakness and right gaze preference.  MRI HEAD WITHOUT CONTRAST MRA HEAD WITHOUT CONTRAST  Technique: Multiplanar, multiecho pulse sequences of the brain and surrounding structures were obtained according to standard protocol without intravenous contrast.  Angiographic images of the head were obtained using MRA technique without contrast.  Comparison: 07/24/2011 CT.  No comparison MR.  MRI HEAD  Findings:  No acute infarct.  No intracranial hemorrhage.  Mild small vessel disease type changes.  No intracranial mass lesion detected on this unenhanced exam.  No hydrocephalus.  Opacification mastoid air cells and middle ear cavities more notable on the right. No obstructing lesions seen in the region of posterior-superior nasopharynx.  Minimal paranasal sinus mucosal thickening.  Mild narrowing upper cervical canal.  Mild exophthalmos.  Major intracranial vascular structures are patent.  IMPRESSION: No acute infarct.  Mild small vessel disease type changes.  Opacification mastoid air cells and middle ear cavities more notable on the right.  MRA HEAD  Findings: Mild to moderate narrowing involving portions of the right internal  carotid artery cavernous segment and mild narrowing and irregularity of the portions of the left internal carotid artery cavernous segment.  Small bulge M1 segment left middle cerebral artery.  This appears to be origin of a vessel. Tiny aneurysm not entirely excluded. This is at the limitation of the present exam.  Middle cerebral artery branch vessel irregularity.  Left vertebral artery is dominant.  Mild irregularity right vertebral artery.  Mild irregularity of the basilar artery without high-grade stenosis.  Nonvisualization PICAs.  Fusiform dilated the P1 segment of the left posterior cerebral artery.  Mild narrowing P1 segment right posterior cerebral artery. Moderate to marked stenosis P1-2 segment right posterior cerebral artery.  Posterior cerebral artery distal branch vessel irregularity greater on the right.  IMPRESSION: Intracranial atherosclerotic type changes as detailed above.  Minimal bulge M1 segment left middle cerebral artery may represent origin of a vessel.  Tiny aneurysm not entirely excluded however this is at the limitation of the present exam.  Original Report Authenticated By: Fuller Canada, M.D.   Mr Brain Wo Contrast  07/27/2011  *RADIOLOGY REPORT*  Clinical Data: 61 year old female with slurred speech, confusion. Cardiac catheterization yesterday.  Altered mental status.  MRI HEAD WITHOUT CONTRAST  Technique:  Multiplanar, multiecho pulse sequences of the brain and surrounding structures were obtained according to standard protocol without intravenous contrast.  Comparison: 07/25/2011.  Findings: The examination had to be discontinued prior to completion due to patient condition.  . Confluent restricted diffusion extending from the central and medial thalamus into the ventral mid brain (series 5 images 13 - 16).  Associated T2 hyperintensity.  No mass effect or evidence of hemorrhage.  Diffusion elsewhere is within normal limits.  No ventriculomegaly. No midline shift, mass effect,  or evidence of mass lesion.  Major intracranial vascular flow voids are stable.  Gray-white matter signal outside of the area of acute ischemia is stable.  Continued right greater than left mastoid effusion.  Fluid layering in the pharynx. Visualized orbit soft tissues are within normal limits.  Negative visualized pituitary, cervicomedullary junction, and cervical spine.  Normal bone marrow signal.  IMPRESSION: 1.  Acute left PCA small vessel infarct involving the thalamus and ventral mid brain.  No mass effect or hemorrhage. 2.  Otherwise stable since 07/25/2011.  Original Report Authenticated By: Harley Hallmark, M.D.   Mr Brain Wo Contrast  07/25/2011  *RADIOLOGY REPORT*  Clinical Data:  High blood pressure.  Diabetes.  Hyperlipidemia. Presenting with transient episode of left-sided weakness and right gaze preference.  MRI HEAD WITHOUT CONTRAST MRA HEAD WITHOUT CONTRAST  Technique: Multiplanar, multiecho pulse sequences of the brain and surrounding structures were obtained according to standard protocol without intravenous contrast.  Angiographic images of the head were obtained using MRA technique without contrast.  Comparison: 07/24/2011 CT.  No comparison MR.  MRI HEAD  Findings:  No acute infarct.  No intracranial hemorrhage.  Mild small vessel disease type changes.  No intracranial mass lesion detected on this unenhanced exam.  No hydrocephalus.  Opacification mastoid air cells and middle ear cavities more notable on the right. No obstructing lesions seen in the region of posterior-superior nasopharynx.  Minimal paranasal sinus mucosal thickening.  Mild narrowing upper cervical canal.  Mild exophthalmos.  Major intracranial vascular structures are patent.  IMPRESSION: No acute infarct.  Mild small vessel disease type changes.  Opacification mastoid air cells and middle ear cavities more notable on the right.  MRA HEAD  Findings: Mild to moderate narrowing involving portions of the right internal carotid  artery cavernous segment and mild narrowing and irregularity of the portions of the left internal carotid artery cavernous segment.  Small bulge M1 segment left middle cerebral artery.  This appears to be origin of a vessel. Tiny aneurysm not entirely excluded. This is at the limitation of the present exam.  Middle cerebral artery branch vessel irregularity.  Left vertebral artery is dominant.  Mild irregularity right vertebral artery.  Mild irregularity of the basilar artery without high-grade stenosis.  Nonvisualization PICAs.  Fusiform dilated the P1 segment of the left posterior cerebral artery.  Mild narrowing P1 segment right posterior cerebral artery. Moderate to marked stenosis P1-2 segment right posterior cerebral artery.  Posterior cerebral artery distal branch vessel irregularity greater on the right.  IMPRESSION: Intracranial atherosclerotic type changes as detailed above.  Minimal bulge M1 segment left middle cerebral artery may represent origin of a vessel.  Tiny aneurysm not entirely excluded however this is at the limitation of the present exam.  Original Report Authenticated By: Fuller Canada, M.D.   Dg Chest Port 1 View  07/25/2011  *RADIOLOGY REPORT*  Clinical Data: Congestive heart failure, improving shortness  of breath  PORTABLE CHEST - 1 VIEW  Comparison: Portable chest x-ray of 07/24/2011  Findings: There may be slight improvement in the degree of moderate pulmonary vascular congestion.  Cardiomegaly, bilateral effusions, and mild congestion remain.  Median sternotomy sutures are noted.  IMPRESSION: Slight improvement in congestive heart failure pattern.  Bilateral pleural effusions remain.  Original Report Authenticated By: Juline Patch, M.D.   Dg Chest Port 1 View  07/24/2011  *RADIOLOGY REPORT*  Clinical Data: Shortness of breath.  PORTABLE CHEST - 1 VIEW  Comparison: 06/29/2011  Findings: Worsening interstitial prominence throughout the lungs, likely interstitial edema.  Mild  cardiomegaly.  Small to moderate bilateral pleural effusions, right greater than left, slightly increased since prior study.  Bibasilar atelectasis.  Prior CABG.  IMPRESSION: Worsening interstitial prominence, likely interstitial edema/CHF. Enlarging bilateral effusions.  Original Report Authenticated By: Cyndie Chime, M.D.    PHYSICAL EXAM  Patient is oriented to person time and place. Affect seems normal. There is no jugulovenous distention. Lungs reveal a few scattered rhonchi and still question of rales at the base. There is no respiratory distress. Cardiac exam reveals S1 and S2. There no clicks or significant murmurs. The abdomen is soft. There is no significant peripheral edema. There is no jugulovenous distention. There are no musculoskeletal deformities. There are no skin rashes.   TELEMETRY: I have reviewed telemetry. There is normal sinus rhythm.   ASSESSMENT AND PLAN:   *Acute on chronic systolic heart failure    The patient cyanosis been fairly equal in the past day or 2. Her last chest x-ray Jul 26, 2011 still reveal pleural effusions. I will re check chest x-ray today.   Acute respiratory failure    Patient presented with acute respiratory failure. It is my understanding that it was primarily CHF. There is no proof of a pulmonary infection. She has been on Biaxin since admission. I now see that she has been on this chronically as an outpatient. I am assuming that this is for her wound.  Therefore I will not stop this.   Altered mental status     The patient's metal status appears to be improving. It appears that this was related to the neurologic event that she had this admission.   NSTEMI (non-ST elevated myocardial infarction)    Cardiac catheterization has been done this admission. 3 of 4 grafts were patent. The fourth graft went to a small area. Medical therapy is recommended.     Cardiomyopathy   Ejection fraction the Cath Lab was 25-35%. Echo following this suggested an  EF of 40%. Patient is on carvedilol and hydralazine. I do not see that there is history of ACE inhibitor allergy. However she was on hydralazine along with carvedilol before admission. Therefore these medications will be continued until there is more input from the physician that knows her best.   Hematuria     Patient is not having any symptoms. She grew Klebsiella in her urine. This seemed to have occurred while she was on Biaxin. I will repeat her urine culture again to gather more information. I'm very hesitant to add another antibiotic without more information. This issue needs to be followed carefully   Hx of CABG   CVA (cerebral infarction)   Neurology has signed off as of today. They've left recommendations concerning the followup of her CVA. They have recommended an eye patch.  The patient is stable to be moved to regular telemetry.    Willa Rough 07/30/2011 10:59 AM

## 2011-07-30 NOTE — Progress Notes (Signed)
Stroke Team Progress Note  HISTORY [name] is an 61 y.o. 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 Was pulled out and felt to have left sided weakness and right gaze preference. Oxygen saturation was in the 60's at that time. Patient was diaphoretic as well and had some chest pain. Patient was brought in as a code stroke. By the time of presentation to the ED the patient was moving all extremities but not following commands. Eye deviation had resolved. With improved oxygenation was following commands as well. Code stroke was canceled. Patient was not a TPA candidate secondary to resolution of her symptoms. She was admitted for further evaluation and treatment.  In hospital, she was ruled in for a NSTEMI. Cardiac cath performed 5/30 shows 3/4 grafts open. Intervention was not appropriate, medical therapy recommended.   Neuro was consulted during admission for ongoing neuro symptoms of double vision. MRI revealed a left thalamic/ventral midbrain stroke.   SUBJECTIVE Her husbnad is at the bedside.  Overall she feels her condition is stable.She still has diplopia and difficulty focussing.  OBJECTIVE Most recent Vital Signs: Filed Vitals:   07/30/11 0400 07/30/11 0500 07/30/11 0600 07/30/11 0700  BP: 107/50 108/55 105/51 108/53  Pulse: 76 74 73 72  Temp: 97.6 F (36.4 C)     TempSrc: Oral     Resp:      Height:      Weight:  71.4 kg (157 lb 6.5 oz)    SpO2: 94% 92% 95% 93%   CBG (last 3)   Basename 07/30/11 0007 07/29/11 1647 07/29/11 1202  GLUCAP 207* 206* 200*   Intake/Output from previous day: 06/02 0701 - 06/03 0700 In: 430 [P.O.:430] Out: 400 [Urine:400]  IV Fluid Intake:     MEDICATIONS    . aspirin  325 mg Oral Daily  . carvedilol  6.25 mg Oral BID  . clarithromycin  500 mg Oral BID WC  . furosemide  40 mg Oral Daily  . gabapentin  300 mg Oral TID  . hydrALAZINE  12.5 mg Oral Q8H  .  insulin aspart  0-9 Units Subcutaneous TID WC  . isosorbide mononitrate  30 mg Oral Daily  . mulitivitamin with minerals  1 tablet Oral Daily  . naloxone  0.2 mg Intravenous Once  . PARoxetine  20 mg Oral q morning - 10a  . pravastatin  40 mg Oral Daily  . sodium chloride  3 mL Intravenous Q12H   PRN:  sodium chloride, acetaminophen, ALPRAZolam, ondansetron (ZOFRAN) IV, sodium chloride, traMADol  Diet:  Carb Control thin liquids Activity:  OOB  DVT Prophylaxis:  SCDs   CLINICALLY SIGNIFICANT STUDIES Basic Metabolic Panel:  Lab 07/28/11 4540 07/27/11 0520  NA 136 135  K 4.7 4.8  CL 98 98  CO2 25 28  GLUCOSE 143* 132*  BUN 27* 25*  CREATININE 0.74 0.85  CALCIUM 9.3 9.3  MG -- --  PHOS -- --   Liver Function Tests:  Lab 07/24/11 0944  AST 29  ALT 20  ALKPHOS 102  BILITOT 0.2*  PROT 8.7*  ALBUMIN 3.5   CBC:  Lab 07/30/11 0530 07/29/11 0618 07/24/11 0944  WBC 5.0 4.6 --  NEUTROABS -- -- 8.9*  HGB 9.9* 10.0* --  HCT 29.3* 29.5* --  MCV 81.4 81.7 --  PLT 159 152 --   Coagulation:  Lab 07/24/11 0944  LABPROT 15.2  INR 1.18   Cardiac Enzymes:  Lab 07/25/11 0843 07/25/11 0105 07/24/11 1726  CKTOTAL 102 123 137  CKMB 7.4* 8.2* 8.3*  CKMBINDEX -- -- --  TROPONINI 1.27* 1.53* 2.25*   BNP:  Lab 07/30/11 0530 07/29/11 0618 07/24/11 0953  PROBNP 1134.0* 1204.0* 3723.0*   Urinalysis:  Lab 07/24/11 1038  COLORURINE AMBER*  LABSPEC 1.022  PHURINE 5.0  GLUCOSEU NEGATIVE  HGBUR LARGE*  BILIRUBINUR SMALL*  KETONESUR NEGATIVE  PROTEINUR NEGATIVE  UROBILINOGEN 0.2  NITRITE NEGATIVE  LEUKOCYTESUR TRACE*   Lipid Panel    Component Value Date/Time   CHOL 134 07/29/2011 0618   TRIG 143 07/29/2011 0618   HDL 36* 07/29/2011 0618   CHOLHDL 3.7 07/29/2011 0618   VLDL 29 07/29/2011 0618   LDLCALC 69 07/29/2011 0618   HgbA1C  Lab Results  Component Value Date   HGBA1C 7.1* 07/29/2011    Urine Drug Screen:     Component Value Date/Time   LABOPIA NONE DETECTED 07/24/2011  1038   COCAINSCRNUR NONE DETECTED 07/24/2011 1038   LABBENZ NONE DETECTED 07/24/2011 1038   AMPHETMU NONE DETECTED 07/24/2011 1038   THCU NONE DETECTED 07/24/2011 1038   LABBARB NONE DETECTED 07/24/2011 1038    Alcohol Level: No results found for this basename: ETH:2 in the last 168 hours  CT of the brain   07/26/2011   No acute findings by head CT.    07/24/2011  No acute findings by CT of the brain.  MRI of the brain   07/27/2011  1.  Acute left PCA small vessel infarct involving the thalamus and ventral mid brain.  No mass effect or hemorrhage. 2.  Otherwise stable since 07/25/2011.    07/25/2011  No acute infarct.  Mild small vessel disease type changes.  Opacification mastoid air cells and middle ear cavities more notable on the right.  MRA of the brain  07/25/2011   Intracranial atherosclerotic type changes as detailed above.  Minimal bulge M1 segment left middle cerebral artery may represent origin of a vessel.  Tiny aneurysm not entirely excluded however this is at the limitation of the present exam.    2D Echocardiogram  EF 40% with no source of embolus.   Carotid Doppler  Right: 40-59% internal carotid artery stenosis. Left: 60-79% internal carotid artery stenosis. Bilateral: Vertebral artery flow is antegrade.   CXR   07/26/2011 1.  Stable appearance of bilateral pleural effusions and airspace disease. 2.  The right pleural effusion may be loculated. 3.  Stable pulmonary vascular congestion.   07/25/2011 Slight improvement in congestive heart failure pattern.  Bilateral pleural effusions remain.   07/24/2011 Worsening interstitial prominence, likely interstitial edema/CHF. Enlarging bilateral effusions.   EKG  sinus tachycardia, RBBB, LAFB, consider LVH.   Therapy Recommendations PT -, OT -  Physical Exam   pleasant middle aged Caucasian lady not in distress.Awake alert. Afebrile. Head is nontraumatic. Neck is supple without bruit. Hearing is normal. Cardiac exam no murmur or gallop.  Lungs are clear to auscultation. Distal pulses are well felt.  Neurological exam ; Awake  Alert oriented x 3. Normal speech and language.eye movements full except mild vertical skew and restriction of right eye abduction without nystagmus. Face symmetric. Tongue midline. Normal strength, tone, reflexes and coordination. Normal sensation. Gait deferred.  ASSESSMENT [name] is a 61 y.o. female with an acute left PCA small vessel infarct involving the thalamus and ventral mid brain. secondary to embolic phenomenon  Or hypotension from MI which occurred prior to arrival.  On aspirin 325  mg orally every day prior to admission. Now on aspirin 325 mg orally every day for secondary stroke prevention. Patient with resultant double vision.  -NSTEMI -diabetes, HgbA1c 7.1 -Hyperlipidemia, LDL 69, on statin  Hospital day # 6  TREATMENT/PLAN -Continue aspirin 325 mg orally every day for secondary stroke prevention. -eye patch, rotate q 2 hours while awake for diplopia -PT and OT consults - D/w husband and patient Joaquin Music, ANP-BC, GNP-BC [facility] Pager: [phone] 07/30/2011 8:42 AM  Scribe for Dr. Delia Heady, Stroke Center Medical Director. He has personally reviewed chart, pertinent data, examined the patient and developed the plan of care. Pager:  [phone]

## 2011-07-30 NOTE — Progress Notes (Signed)
Inpatient Diabetes Program Recommendations  AACE/ADA: New Consensus Statement on Inpatient Glycemic Control (2009)  Target Ranges:  Prepandial:   less than 140 mg/dL      Peak postprandial:   less than 180 mg/dL (1-2 hours)      Critically ill patients:  140 - 180 mg/dL  Results for [name], [name] (MRN [id number]) as of 07/30/2011 11:45  Ref. Range 07/29/2011 07:47 07/29/2011 12:02 07/29/2011 16:47 07/30/2011 00:07 07/30/2011 07:40  Glucose-Capillary Latest Range: 70-99 mg/dL 409 (H) 811 (H) 914 (H) 207 (H) 179 (H)    Inpatient Diabetes Program Recommendations Insulin - Basal: Lantus 10 units  Insulin - Meal Coverage: .  Thank you  Piedad Climes [facility] Inpatient Diabetes Coordinator [phone]

## 2011-07-31 DIAGNOSIS — I214 Non-ST elevation (NSTEMI) myocardial infarction: Secondary | ICD-10-CM

## 2011-07-31 DIAGNOSIS — I5023 Acute on chronic systolic (congestive) heart failure: Secondary | ICD-10-CM

## 2011-07-31 DIAGNOSIS — R404 Transient alteration of awareness: Secondary | ICD-10-CM

## 2011-07-31 DIAGNOSIS — Z951 Presence of aortocoronary bypass graft: Secondary | ICD-10-CM

## 2011-07-31 LAB — CBC
Hemoglobin: 9.8 g/dL — ABNORMAL LOW (ref 12.0–15.0)
MCHC: 33.6 g/dL (ref 30.0–36.0)
Platelets: 150 10*3/uL (ref 150–400)
RBC: 3.53 MIL/uL — ABNORMAL LOW (ref 3.87–5.11)

## 2011-07-31 LAB — GLUCOSE, CAPILLARY
Glucose-Capillary: 310 mg/dL — ABNORMAL HIGH (ref 70–99)
Glucose-Capillary: 320 mg/dL — ABNORMAL HIGH (ref 70–99)
Glucose-Capillary: 396 mg/dL — ABNORMAL HIGH (ref 70–99)

## 2011-07-31 LAB — PRO B NATRIURETIC PEPTIDE: Pro B Natriuretic peptide (BNP): 1141 pg/mL — ABNORMAL HIGH (ref 0–125)

## 2011-07-31 NOTE — Progress Notes (Signed)
     Patient: [name] Date of Encounter: 07/31/2011, 3:43 PM Admit date: 07/24/2011     Subjective  Ms. Demeo has no new complaints. She continues to have diplopia and an unsteady gait. She denies chest pain, shortness of breath or palpitations.    Objective   Filed Vitals:   07/31/11 1420  BP: 107/60  Pulse: 73  Temp: 98.3 F (36.8 C)  Resp: 20     Intake/Output Summary (Last 24 hours) at 07/31/11 1543 Last data filed at 07/31/11 1324  Gross per 24 hour  Intake   1425 ml  Output   1525 ml  Net   -100 ml    Physical Exam: General: Well developed, well appearing 61 year old female, sitting comfortably, in no acute distress. Head: Normocephalic, atraumatic, sclera non-icteric, no xanthomas, nares are without discharge.  Neck: Supple. JVD not elevated. Lungs: Clear bilaterally to auscultation without wheezes, rales, or rhonchi. Breathing is unlabored. Heart: RRR S1 S2 without murmurs, rubs, or gallops.  Abdomen: Soft, non-distended.  Extremities: No clubbing or cyanosis. No edema.  Distal pedal pulses are 2+ and equal bilaterally. Neuro: Alert and oriented X 3. Moves all extremities spontaneously.  Psych:  Responds to questions appropriately with a normal affect.  Inpatient Medications:  . aspirin  325 mg Oral Daily  . carvedilol  6.25 mg Oral BID  . clarithromycin  500 mg Oral BID WC  . furosemide  40 mg Oral Daily  . gabapentin  300 mg Oral TID  . hydrALAZINE  12.5 mg Oral Q8H  . insulin aspart  0-9 Units Subcutaneous TID WC  . isosorbide mononitrate  30 mg Oral Daily  . mulitivitamin with minerals  1 tablet Oral Daily  . PARoxetine  20 mg Oral q morning - 10a  . pravastatin  40 mg Oral Daily   Labs:  Basename 07/31/11 0610 07/30/11 0530  WBC 4.8 5.0  NEUTROABS -- --  HGB 9.8* 9.9*  HCT 29.2* 29.3*  MCV 82.7 81.4  PLT 150 159   BNP 1141  Radiology/Studies: Portable CXR 07/30/2011   Findings: Upper normal heart size post CABG. Mediastinal contours  normal. Improved interstitial edema. Bibasilar effusions, probably not significantly changed. Upper lungs clear. No pneumothorax or acute osseous findings.  IMPRESSION: Post CABG. Improved interstitial edema with persistent small bibasilar effusions.  Original Report Authenticated By: Lollie Marrow, M.D.   Echocardiogram: 07/26/2011 - Left ventricle: Inferior septal and apical hypokinesis The cavity size was mildly dilated. Wall thickness was increased in a pattern of mild LVH. The estimated ejection fraction was 40%. Mitral valve: Mild regurgitation. Left atrium: The atrium was mildly dilated. Pulmonary arteries: PA peak pressure: 31mm Hg (S). Impression: Overall poor image quality  Telemetry: normal sinus rhythm; no arrhythmias   Assessment and Plan  1.  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  Acute on chronic systolic CHF - patient appears euvolemic by exam today; continue medical therapy 3.  NSTEMI s/p LHC 07/26/2011 - 3/4 grafts patent; medical management recommended 4.  Sternal osteomyelitis s/p CABG - appreciate CT surgery input  Signed, EDMISTEN, BROOKE PA-C  Pt feels that she continues to improve.  Her SOB has resolved.  She continues to have diplopia.  Will have PT assess in am.  Possibly home in next 24-48 hours.  Fayrene Fearing Luisdaniel Kenton,MD

## 2011-07-31 NOTE — Progress Notes (Signed)
Inpatient Diabetes Program Recommendations  AACE/ADA: New Consensus Statement on Inpatient Glycemic Control (2009)  Target Ranges:  Prepandial:   less than 140 mg/dL      Peak postprandial:   less than 180 mg/dL (1-2 hours)      Critically ill patients:  140 - 180 mg/dL   Results for [name], [name] (MRN [id number]) as of 07/31/2011 12:44  Ref. Range 07/30/2011 12:32 07/30/2011 16:31 07/30/2011 21:33 07/31/2011 06:49 07/31/2011 10:53  Glucose-Capillary Latest Range: 70-99 mg/dL 409 (H) 811 (H) 914 (H) 310 (H) 320 (H)    Inpatient Diabetes Program Recommendations Insulin - Basal: Lantus 35 units Insulin - Meal Coverage: .  Patient takes Lantus 35 units BID at home.  CBGs are steadily rising without basal insulin.  Suspect HYPOglycemic event a few days ago was from the Novolog 20 units given a few hours prior to the event.  Please initiate at least 1/2 home dose Lantus.  Thank you  Piedad Climes [facility] Inpatient Diabetes Coordinator [phone]

## 2011-07-31 NOTE — Progress Notes (Signed)
Stroke Team Progress Note  HISTORY [name] is an 61 y.o. 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 Was pulled out and felt to have left sided weakness and right gaze preference. Oxygen saturation was in the 60's at that time. Patient was diaphoretic as well and had some chest pain. Patient was brought in as a code stroke. By the time of presentation to the ED the patient was moving all extremities but not following commands. Eye deviation had resolved. With improved oxygenation was following commands as well. Code stroke was canceled. Patient was not a TPA candidate secondary to resolution of her symptoms. She was admitted for further evaluation and treatment.  In hospital, she was ruled in for a NSTEMI. Cardiac cath performed 5/30 shows 3/4 grafts open. Intervention was not appropriate, medical therapy recommended.   Neuro was consulted during admission for ongoing neuro symptoms of double vision. MRI revealed a left thalamic/ventral midbrain stroke.   SUBJECTIVE Pt up in bathroom with nursing tech. No complaints. Feels eye patch is improving double vision.  OBJECTIVE Most recent Vital Signs: Filed Vitals:   07/30/11 1616 07/30/11 2140 07/31/11 0216 07/31/11 0701  BP: 99/52 118/58 101/71 110/63  Pulse: 77 79 72 72  Temp: 98 F (36.7 C) 98.5 F (36.9 C) 97.7 F (36.5 C) 97.8 F (36.6 C)  TempSrc: Oral Oral Oral Oral  Resp: 20 20 20 20   Height: 5\' 5"  (1.651 m)     Weight: 70.9 kg (156 lb 4.9 oz)   70.534 kg (155 lb 8 oz)  SpO2: 95% 96% 96% 98%   CBG (last 3)  Basename 07/30/11 2133 07/30/11 1631 07/30/11 1232  GLUCAP 326* 225* 218*   Intake/Output from previous day: 06/03 0701 - 06/04 0700 In: 945 [P.O.:942; I.V.:3] Out: 1200 [Urine:1200]  IV Fluid Intake:     MEDICATIONS    . aspirin  325 mg Oral Daily  . carvedilol  6.25 mg Oral BID  . clarithromycin  500 mg Oral BID WC  . furosemide  40  mg Oral Daily  . gabapentin  300 mg Oral TID  . hydrALAZINE  12.5 mg Oral Q8H  . insulin aspart  0-9 Units Subcutaneous TID WC  . isosorbide mononitrate  30 mg Oral Daily  . mulitivitamin with minerals  1 tablet Oral Daily  . PARoxetine  20 mg Oral q morning - 10a  . pravastatin  40 mg Oral Daily  . sodium chloride  3 mL Intravenous Q12H  . DISCONTD: naloxone  0.2 mg Intravenous Once   PRN:  sodium chloride, acetaminophen, ALPRAZolam, ondansetron (ZOFRAN) IV, sodium chloride, traMADol  Diet:  Carb Control thin liquids Activity:  OOB  DVT Prophylaxis:  SCDs   CLINICALLY SIGNIFICANT STUDIES Basic Metabolic Panel:  Lab 07/28/11 4098 07/27/11 0520  NA 136 135  K 4.7 4.8  CL 98 98  CO2 25 28  GLUCOSE 143* 132*  BUN 27* 25*  CREATININE 0.74 0.85  CALCIUM 9.3 9.3  MG -- --  PHOS -- --   Liver Function Tests:  Lab 07/24/11 0944  AST 29  ALT 20  ALKPHOS 102  BILITOT 0.2*  PROT 8.7*  ALBUMIN 3.5   CBC:  Lab 07/31/11 0610 07/30/11 0530 07/24/11 0944  WBC 4.8 5.0 --  NEUTROABS -- -- 8.9*  HGB 9.8* 9.9* --  HCT 29.2* 29.3* --  MCV 82.7 81.4 --  PLT 150 159 --   Coagulation:  Lab 07/24/11 0944  LABPROT 15.2  INR 1.18   Cardiac Enzymes:  Lab 07/25/11 0843 07/25/11 0105 07/24/11 1726  CKTOTAL 102 123 137  CKMB 7.4* 8.2* 8.3*  CKMBINDEX -- -- --  TROPONINI 1.27* 1.53* 2.25*   BNP:  Lab 07/31/11 0610 07/30/11 0530 07/29/11 0618  PROBNP 1141.0* 1134.0* 1204.0*   Urinalysis:  Lab 07/24/11 1038  COLORURINE AMBER*  LABSPEC 1.022  PHURINE 5.0  GLUCOSEU NEGATIVE  HGBUR LARGE*  BILIRUBINUR SMALL*  KETONESUR NEGATIVE  PROTEINUR NEGATIVE  UROBILINOGEN 0.2  NITRITE NEGATIVE  LEUKOCYTESUR TRACE*   Lipid Panel    Component Value Date/Time   CHOL 134 07/29/2011 0618   TRIG 143 07/29/2011 0618   HDL 36* 07/29/2011 0618   CHOLHDL 3.7 07/29/2011 0618   VLDL 29 07/29/2011 0618   LDLCALC 69 07/29/2011 0618   HgbA1C  Lab Results  Component Value Date   HGBA1C 7.1* 07/29/2011      Urine Drug Screen:     Component Value Date/Time   LABOPIA NONE DETECTED 07/24/2011 1038   COCAINSCRNUR NONE DETECTED 07/24/2011 1038   LABBENZ NONE DETECTED 07/24/2011 1038   AMPHETMU NONE DETECTED 07/24/2011 1038   THCU NONE DETECTED 07/24/2011 1038   LABBARB NONE DETECTED 07/24/2011 1038    Alcohol Level: No results found for this basename: ETH:2 in the last 168 hours  CT of the brain   07/26/2011   No acute findings by head CT.    07/24/2011  No acute findings by CT of the brain.  MRI of the brain   07/27/2011  1.  Acute left PCA small vessel infarct involving the thalamus and ventral mid brain.  No mass effect or hemorrhage. 2.  Otherwise stable since 07/25/2011.    07/25/2011  No acute infarct.  Mild small vessel disease type changes.  Opacification mastoid air cells and middle ear cavities more notable on the right.  MRA of the brain  07/25/2011   Intracranial atherosclerotic type changes as detailed above.  Minimal bulge M1 segment left middle cerebral artery may represent origin of a vessel.  Tiny aneurysm not entirely excluded however this is at the limitation of the present exam.    2D Echocardiogram  EF 40% with no source of embolus.   Carotid Doppler  Right: 40-59% internal carotid artery stenosis. Left: 60-79% internal carotid artery stenosis. Bilateral: Vertebral artery flow is antegrade.   CXR   07/26/2011 1.  Stable appearance of bilateral pleural effusions and airspace disease. 2.  The right pleural effusion may be loculated. 3.  Stable pulmonary vascular congestion.   07/25/2011 Slight improvement in congestive heart failure pattern.  Bilateral pleural effusions remain.   07/24/2011 Worsening interstitial prominence, likely interstitial edema/CHF. Enlarging bilateral effusions.   EKG  sinus tachycardia, RBBB, LAFB, consider LVH.   Therapy Recommendations PT -, OT -  Physical Exam   pleasant middle aged Caucasian lady not in distress.Awake alert. Afebrile. Head is  nontraumatic. Neck is supple without bruit. Hearing is normal. Cardiac exam no murmur or gallop. Lungs are clear to auscultation. Distal pulses are well felt.  Neurological exam ; Awake  Alert oriented x 3. Normal speech and language.eye movements full except mild vertical skew and restriction of right eye abduction without nystagmus, up-gaze paralysis. Face symmetric. Tongue midline. Normal strength, tone, reflexes and coordination. Normal sensation. Gait deferred.   ASSESSMENT [name] is a 61 y.o. female with an acute left PCA small vessel infarct involving the thalamus and ventral mid brain.  secondary to embolic phenomenon  Or hypotension from MI which occurred prior to arrival.  On aspirin 325 mg orally every day prior to admission. Now on aspirin 325 mg orally every day for secondary stroke prevention. Patient with resultant double vision.  -NSTEMI -diabetes, HgbA1c 7.1 -Hyperlipidemia, LDL 69, on statin  Hospital day # 7  TREATMENT/PLAN -Continue aspirin 325 mg orally every day for secondary stroke prevention. -continue eye patch, rotate q 2 hours while awake for diplopia -PT and OT consults ordered - D/w patient - Stroke team will sign off. Call for questions. Follow up Dr. Pearlean Brownie in 2 mos.  Joaquin Music, ANP-BC, GNP-BC [facility] Pager: [phone] 07/31/2011 8:27 AM  Scribe for Dr. Delia Heady, Stroke Center Medical Director. He has personally reviewed chart, pertinent data, examined the patient and developed the plan of care. Pager:  [phone]

## 2011-07-31 NOTE — Evaluation (Addendum)
Physical Therapy Evaluation Patient Details Name: [name] MRN: [id number] DOB: 02/04/1951 Today's Date: 07/31/2011 Time: 4098-1191 PT Time Calculation (min): 29 min  PT Assessment / Plan / Recommendation Clinical Impression  Pt admitted with SOB and diaphoresis.  MRI showed evidence of L PCA distribution infarct.  Presently visual changes remain though moderated by an eye patch.  Gait is unsteady and guarded with pt listing/wandering R when not assisted or when scanning.  pt would be a good rehab candidate with coordination of visual treatment, but if doesn't qualifiy consider OPPT    PT Assessment  Patient needs continued PT services    Follow Up Recommendations  Inpatient Rehab ( )    Barriers to Discharge None      lEquipment Recommendations  Defer to next venue    Recommendations for Other Services Rehab consult;OT consult   Frequency Min 3X/week    Precautions / Restrictions Precautions Precautions: Fall Restrictions Weight Bearing Restrictions: No   Pertinent Vitals/Pain       Mobility  Bed Mobility Bed Mobility: Supine to Sit;Sitting - Scoot to Edge of Bed Supine to Sit: 4: Min guard;With rails Sitting - Scoot to Edge of Bed: 4: Min guard;With rail Details for Bed Mobility Assistance: mild struggle but with no assist Transfers Transfers: Sit to Stand;Stand to Sit Sit to Stand: 4: Min guard Stand to Sit: 4: Min assist Details for Transfer Assistance: vc's for hand placement; assist to control descent Ambulation/Gait Ambulation/Gait Assistance: 4: Min assist;Other (comment) (to min guard) Ambulation Distance (Feet): 180 Feet Assistive device: 1 person hand held assist;None Ambulation/Gait Assistance Details: gait characterized as guarded with UE's at mid guard position and trunk stiff.  Whether scanning or not pt listed and wandered R.  Instability worsened with scanning. Gait Pattern: Step-through pattern;Decreased step length - right;Decreased step  length - left;Decreased stride length (wandering R) Gait velocity: slow cadence and not significant speed increase when cued to do so. Stairs: No Wheelchair Mobility Wheelchair Mobility: No Modified Rankin (Stroke Patients Only) Pre-Morbid Rankin Score: Moderately severe disability    Exercises     PT Diagnosis: Difficulty walking;Generalized weakness  PT Problem List: Decreased strength;Decreased activity tolerance;Decreased balance;Decreased mobility;Decreased coordination;Decreased safety awareness PT Treatment Interventions: DME instruction;Gait training;Functional mobility training;Therapeutic activities;Balance training;Patient/family education   PT Goals Acute Rehab PT Goals PT Goal Formulation: With patient Time For Goal Achievement: 07/31/11 Potential to Achieve Goals: Good Pt will go Supine/Side to Sit: with modified independence PT Goal: Supine/Side to Sit - Progress: Goal set today Pt will go Sit to Stand: with supervision PT Goal: Sit to Stand - Progress: Goal set today Pt will Transfer Bed to Chair/Chair to Bed: with supervision PT Transfer Goal: Bed to Chair/Chair to Bed - Progress: Goal set today Pt will Ambulate: >150 feet;with supervision;with least restrictive assistive device PT Goal: Ambulate - Progress: Goal set today Additional Goals Additional Goal #1: DGI >=20 PT Goal: Additional Goal #1 - Progress: Goal set today  Visit Information  Last PT Received On: 07/31/11 Assistance Needed: +1    Subjective Data  Subjective: I just don't want to have to use and assistive device Patient Stated Goal: Home Indendent with husband   Prior Functioning  Home Living Lives With: Spouse Available Help at Discharge: Family Type of Home: House Home Access: Level entry Home Layout: Two level;Able to live on main level with bedroom/bathroom Bathroom Shower/Tub: Health visitor: Handicapped height Home Adaptive Equipment: None Prior Function Level of  Independence: Independent Able to Take  Stairs?: Yes Driving: Yes Communication Communication: No difficulties    Cognition  Overall Cognitive Status: Appears within functional limits for tasks assessed/performed Arousal/Alertness: Awake/alert Orientation Level: Appears intact for tasks assessed Behavior During Session: [facility] for tasks performed    Extremity/Trunk Assessment Right Upper Extremity Assessment RUE ROM/Strength/Tone: Deficits Right Lower Extremity Assessment RLE ROM/Strength/Tone: Deficits Left Lower Extremity Assessment LLE ROM/Strength/Tone: Deficits LLE ROM/Strength/Tone Deficits: bil gross 4/5 strength Trunk Assessment Trunk Assessment: Normal   Balance Balance Balance Assessed: Yes Dynamic Standing Balance Dynamic Standing - Balance Support: Right upper extremity supported;During functional activity Dynamic Standing - Level of Assistance: 4: Min assist  End of Session PT - End of Session Activity Tolerance: Patient tolerated treatment well Patient left: in chair;with call bell/phone within reach Nurse Communication: Mobility status   Nicholi Ghuman, Eliseo Gum 07/31/2011, 4:35 PM  07/31/2011  [place], PT [phone] (pager)

## 2011-07-31 NOTE — Progress Notes (Signed)
Pharmacist Heart Failure Core Measure Documentation  Assessment: [name] has an EF documented as 40% on 07/26/11 by ECHO.  Rationale: Heart failure patients with left ventricular systolic dysfunction (LVSD) and an EF < 40% should be prescribed an angiotensin converting enzyme inhibitor (ACEI) or angiotensin receptor blocker (ARB) at discharge unless a contraindication is documented in the medical record.  This patient is not currently on an ACEI or ARB for HF.  This note is being placed in the record in order to provide documentation that a contraindication to the use of these agents is present for this encounter.  ACE Inhibitor or Angiotensin Receptor Blocker is contraindicated (specify all that apply)  []   ACEI allergy AND ARB allergy []   Angioedema []   Moderate or severe aortic stenosis [x]   Hyperkalemia []   Hypotension []   Renal artery stenosis []   Worsening renal function, preexisting renal disease or dysfunction   [name] 07/31/2011 11:26 AM

## 2011-07-31 NOTE — Progress Notes (Signed)
Neuro status improved Sternal wound small and dry- husband will be able to do dressing changes at home Will see in office in 10 days

## 2011-08-01 ENCOUNTER — Inpatient Hospital Stay (HOSPITAL_COMMUNITY): Payer: 59

## 2011-08-01 DIAGNOSIS — M869 Osteomyelitis, unspecified: Secondary | ICD-10-CM

## 2011-08-01 DIAGNOSIS — R404 Transient alteration of awareness: Secondary | ICD-10-CM

## 2011-08-01 DIAGNOSIS — N390 Urinary tract infection, site not specified: Secondary | ICD-10-CM

## 2011-08-01 DIAGNOSIS — I6350 Cerebral infarction due to unspecified occlusion or stenosis of unspecified cerebral artery: Secondary | ICD-10-CM

## 2011-08-01 DIAGNOSIS — R471 Dysarthria and anarthria: Secondary | ICD-10-CM

## 2011-08-01 LAB — BASIC METABOLIC PANEL
Calcium: 9 mg/dL (ref 8.4–10.5)
Creatinine, Ser: 1.07 mg/dL (ref 0.50–1.10)
GFR calc Af Amer: 64 mL/min — ABNORMAL LOW (ref >90)
GFR calc non Af Amer: 55 mL/min — ABNORMAL LOW (ref >90)

## 2011-08-01 LAB — GLUCOSE, CAPILLARY
Glucose-Capillary: 418 mg/dL — ABNORMAL HIGH (ref 70–99)
Glucose-Capillary: 474 mg/dL — ABNORMAL HIGH (ref 70–99)

## 2011-08-01 LAB — CBC
HCT: 29 % — ABNORMAL LOW (ref 36.0–46.0)
Hemoglobin: 9.5 g/dL — ABNORMAL LOW (ref 12.0–15.0)
MCH: 26.8 pg (ref 26.0–34.0)
MCHC: 32.8 g/dL (ref 30.0–36.0)
RDW: 14.5 % (ref 11.5–15.5)

## 2011-08-01 LAB — BLOOD GAS, ARTERIAL
Drawn by: 27733
FIO2: 0.21 %
Patient temperature: 98.6
pCO2 arterial: 42.3 mmHg (ref 35.0–45.0)
pH, Arterial: 7.437 — ABNORMAL HIGH (ref 7.350–7.400)

## 2011-08-01 MED ORDER — SODIUM CHLORIDE 0.9 % IV BOLUS (SEPSIS)
250.0000 mL | Freq: Once | INTRAVENOUS | Status: AC
  Administered 2011-08-01: 250 mL via INTRAVENOUS

## 2011-08-01 MED ORDER — LEVOFLOXACIN 500 MG PO TABS
500.0000 mg | ORAL_TABLET | Freq: Every day | ORAL | Status: DC
  Administered 2011-08-01 – 2011-08-03 (×3): 500 mg via ORAL
  Filled 2011-08-01 (×3): qty 1

## 2011-08-01 MED ORDER — INSULIN ASPART 100 UNIT/ML ~~LOC~~ SOLN
8.0000 [IU] | Freq: Once | SUBCUTANEOUS | Status: AC
  Administered 2011-08-01: 8 [IU] via SUBCUTANEOUS

## 2011-08-01 MED ORDER — INSULIN GLARGINE 100 UNIT/ML ~~LOC~~ SOLN
17.0000 [IU] | Freq: Two times a day (BID) | SUBCUTANEOUS | Status: DC
  Administered 2011-08-01 – 2011-08-02 (×3): 17 [IU] via SUBCUTANEOUS

## 2011-08-01 NOTE — Progress Notes (Signed)
Called to assist with patient with neuro status changes.  Staff reports some aphasia and confusion this afternoon as well as increased lethargy.  On arrival patient in bed - w/d = pale - arouses to voice but sleepy. No focal neuro changes noted - speech appropriate - oriented - but does fall asleep mid-sentence.  Some snoring and apnea noted when sleeping.  Bil BS clear.  Midsternal DDI.  No JVD noted.  BP 101/50 HR 79 RR 12 O2 sats 96% RA.  Bernette Mayers NP and Dr. Patty Sermons here.  Dr. Thad Ranger here.  NS 250cc IV bolus started.  Patient to CT scan - tol well.  More alert after CT.  Answers questions approp but still sleepy when left alone.  No apnea noted at this time.  BP 110/62   HR 78 RR 16  O2 sats 95% on RA.  Stat labs ordered per MD.  Will follow as needed.  Blood sugars today have been greater that 400 - currently 415.

## 2011-08-01 NOTE — Consult Note (Signed)
Date of Admission:  07/24/2011  Date of Consult:  08/01/2011  Reason for Consult:Sternal Osteomyelitis, UTI Referring Physician: Myrtis Ser  Impression/Recommendation Sternal Osteomyelitis UTI Would-  Stop biaxin Levaquin for 7 days for UTI.  Available if questions  Comment- not clear why she has been getting biaxin. She thinks [facility] got [facility] at [facility], [facility] has no records of this. Possibly she got from [facility] center? There are no records/Cx in the computer to suggest that there is a new organism to which this would have been effective.  Would defer treating her sternal osteo any further to Dr Morton Peters. The wound appears well healed at this point. Could consider a MRI to f/u but again will defer to Dr Morton Peters.    Thank you so much for this interesting consult,   Johny Sax [phone]  [name] is an 61 y.o. female.  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 She developed a superficial sternal wound (noted in December 2012) and exposed wire. She underwent sternal wound debridement 03-01-11 and placement of VAC (Cx-Serratia. R- cefazolin, cefoxitin). She was treated with ceftriaxone IV for 6-8 wks. This was followed by a prolonged course of cipro. Her wound was healing poorly and she had biaxin started ~ 3 weeks (it was not clear who started this or what it was targeting. WOC?).  She was in hyperbaric O2 therapy on 5-28 but was taken out of chamber after becoming unresponsive, diaphoretic (SPO2 60%). Pt had R sided gaze preference and L sided weakness but CT head (-). She was found to have a WBC of 17.5 pH of 7.082 pCO2 89. She was put on BiPAP, diuresed, started on steroids, nebulizers. Troponin+ on admission but felt to be due to demand ischemia rather than a true MI. She underwent cardiac cath on 5-30 showing 1. 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 The vein graft to the OM is small in caliber and diffusely diseased. The target vessel  (OM branch) is very small. 2. Severe LV systolic dysfunction. Post cath she developed confusion, facial droop. Stat CT head showed- no change. 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 She is now found to have UCx K pneumonia (5-31) R- ampicillin and bactrim.  Marland Kitchen   Past Medical History  Diagnosis Date  . Coronary atherosclerosis of native coronary artery     BMS circ 2001, DES LAD 2005, DES LAD and RCA 2008, subsequent CABG  . Drug allergy     Allergy to Plavix (hives) - took Ticlid in past  . Essential hypertension, benign   . Mixed hyperlipidemia   . Type 2 diabetes mellitus   . Myocardial infarction ~ 2002  . Arthritis     rheumatoid  . Depression   . Peripheral neuropathy   . Peptic ulcer disease ~ 1975  . Nephrolithiasis 02/07/2011  . Angina   . Breast cancer     Chemo and mastectomy- right  . Diabetes mellitus   . Chronic systolic heart failure     LVEF 40-45%  . Anxiety   . Sternal wound infection     CABG wound    Past Surgical History  Procedure Date  . Mastectomy 1988    right  . Cholecystectomy 1969  . Abdominal hysterectomy 1989  . Tonsillectomy and adenoidectomy 1979  . Appendectomy 1969  . Tubal ligation 1972  . Coronary artery bypass  graft 01/11/2011    Procedure: CORONARY ARTERY BYPASS GRAFTING (CABG);  Surgeon: Kathlee Nations Suann Larry, MD;  Location: [facility] OR;  Service: Open Heart Surgery;  Laterality: N/A;  cabg x four, using left internal mammary artery and right leg greater saphenous vein harvestede endoscopically  . Sternal wound debridement 03/01/2011    Procedure: STERNAL WOUND DEBRIDEMENT;  Surgeon: Kathlee Nations Suann Larry, MD;  Location: [facility] OR;  Service: Open Heart Surgery;  Laterality: N/A;  . Application of wound vac 03/01/2011    Procedure: APPLICATION OF WOUND VAC;  Surgeon: Kathlee Nations Suann Larry, MD;  Location: [facility] OR;  Service: Open Heart Surgery;  Laterality: N/A;  . Breast surgery   . Sternal  wires removal 04/02/2011    Procedure: STERNAL WIRES REMOVAL;  Surgeon: Kathlee Nations Suann Larry, MD;  Location: [facility] OR;  Service: Open Heart Surgery;  Laterality: N/A;  ergies:   Allergies  Allergen Reactions  . Clopidogrel Bisulfate Hives  . Codeine Nausea And Vomiting  . Morphine Nausea And Vomiting    Medications:  Scheduled:   . aspirin  325 mg Oral Daily  . carvedilol  6.25 mg Oral BID  . clarithromycin  500 mg Oral BID WC  . furosemide  40 mg Oral Daily  . gabapentin  300 mg Oral TID  . hydrALAZINE  12.5 mg Oral Q8H  . insulin aspart  0-9 Units Subcutaneous TID WC  . insulin aspart  8 Units Subcutaneous Once  . insulin glargine  17 Units Subcutaneous BID  . isosorbide mononitrate  30 mg Oral Daily  . mulitivitamin with minerals  1 tablet Oral Daily  . PARoxetine  20 mg Oral q morning - 10a  . pravastatin  40 mg Oral Daily  . sodium chloride  250 mL Intravenous Once  . sodium chloride  3 mL Intravenous Q12H    Social History:  reports that she quit smoking about 4 years ago. Her smoking use included Cigarettes. She has a 20 pack-year smoking history. She has never used smokeless tobacco. She reports that she does not drink alcohol or use illicit drugs.  Family History  Problem Relation Age of Onset  . Anesthesia problems Neg Hx     General ROS: had cloudy urine PTA, no fevers, no changes in MB. See HPI.   Blood pressure 110/60, pulse 74, temperature 98.2 F (36.8 C), temperature source Oral, resp. rate 18, height 5\' 5"  (1.651 m), weight 71.169 kg (156 lb 14.4 oz), last menstrual period 01/05/1996, SpO2 94.00%. General appearance: alert, cooperative and no distress Eyes: negative findings: pupils equal, round, reactive to light and accomodation Throat: normal findings: tongue midline and normal, soft palate, uvula, and tonsils normal and oropharynx pink & moist without lesions or evidence of thrush Neck: no adenopathy Lungs: clear to auscultation bilaterally Heart: regular  rate and rhythm Abdomen: normal findings: bowel sounds normal and soft, non-tender Extremities: edema none and 3+ dorsalis pedis pulses.  chest wound is ~ .5 cm. it is scarred and there is no opening of the wound. there is no d/c. there is no fluctuance.    Results for orders placed during the hospital encounter of 07/24/11 (from the past 48 hour(s))  GLUCOSE, CAPILLARY     Status: Abnormal   Collection Time   07/30/11  4:31 PM      Component Value Range Comment   Glucose-Capillary 225 (*) 70 - 99 (mg/dL)    Comment 1 Notify RN     GLUCOSE, CAPILLARY  Status: Abnormal   Collection Time   07/30/11  9:33 PM      Component Value Range Comment   Glucose-Capillary 326 (*) 70 - 99 (mg/dL)    Comment 1 Notify RN      Comment 2 Documented in Chart     CBC     Status: Abnormal   Collection Time   07/31/11  6:10 AM      Component Value Range Comment   WBC 4.8  4.0 - 10.5 (K/uL)    RBC 3.53 (*) 3.87 - 5.11 (MIL/uL)    Hemoglobin 9.8 (*) 12.0 - 15.0 (g/dL)    HCT 45.4 (*) 09.8 - 46.0 (%)    MCV 82.7  78.0 - 100.0 (fL)    MCH 27.8  26.0 - 34.0 (pg)    MCHC 33.6  30.0 - 36.0 (g/dL)    RDW 11.9  14.7 - 82.9 (%)    Platelets 150  150 - 400 (K/uL)   PRO B NATRIURETIC PEPTIDE     Status: Abnormal   Collection Time   07/31/11  6:10 AM      Component Value Range Comment   Pro B Natriuretic peptide (BNP) 1141.0 (*) 0 - 125 (pg/mL)   GLUCOSE, CAPILLARY     Status: Abnormal   Collection Time   07/31/11  6:49 AM      Component Value Range Comment   Glucose-Capillary 310 (*) 70 - 99 (mg/dL)   GLUCOSE, CAPILLARY     Status: Abnormal   Collection Time   07/31/11 10:53 AM      Component Value Range Comment   Glucose-Capillary 320 (*) 70 - 99 (mg/dL)   GLUCOSE, CAPILLARY     Status: Abnormal   Collection Time   07/31/11  4:29 PM      Component Value Range Comment   Glucose-Capillary 396 (*) 70 - 99 (mg/dL)    Comment 1 Notify RN     GLUCOSE, CAPILLARY     Status: Abnormal   Collection Time   07/31/11   9:42 PM      Component Value Range Comment   Glucose-Capillary 339 (*) 70 - 99 (mg/dL)    Comment 1 Notify RN     CBC     Status: Abnormal   Collection Time   08/01/11  5:00 AM      Component Value Range Comment   WBC 4.2  4.0 - 10.5 (K/uL)    RBC 3.55 (*) 3.87 - 5.11 (MIL/uL)    Hemoglobin 9.5 (*) 12.0 - 15.0 (g/dL)    HCT 56.2 (*) 13.0 - 46.0 (%)    MCV 81.7  78.0 - 100.0 (fL)    MCH 26.8  26.0 - 34.0 (pg)    MCHC 32.8  30.0 - 36.0 (g/dL)    RDW 86.5  78.4 - 69.6 (%)    Platelets 157  150 - 400 (K/uL)   PRO B NATRIURETIC PEPTIDE     Status: Abnormal   Collection Time   08/01/11  6:00 AM      Component Value Range Comment   Pro B Natriuretic peptide (BNP) 1076.0 (*) 0 - 125 (pg/mL)   GLUCOSE, CAPILLARY     Status: Abnormal   Collection Time   08/01/11  6:23 AM      Component Value Range Comment   Glucose-Capillary 329 (*) 70 - 99 (mg/dL)   GLUCOSE, CAPILLARY     Status: Abnormal   Collection Time   08/01/11 11:52 AM  Component Value Range Comment   Glucose-Capillary 418 (*) 70 - 99 (mg/dL)    Comment 1 Documented in Chart      Comment 2 Notify RN         Component Value Date/Time   SDES BLOOD LEFT HAND 07/27/2011 1102   SPECREQUEST BOTTLES DRAWN AEROBIC ONLY 8CC 07/27/2011 1102   CULT        BLOOD CULTURE RECEIVED NO GROWTH TO DATE CULTURE WILL BE HELD FOR 5 DAYS BEFORE ISSUING A FINAL NEGATIVE REPORT 07/27/2011 1102   REPTSTATUS PENDING 07/27/2011 1102   Ct Head Wo Contrast  08/01/2011  *RADIOLOGY REPORT*  Clinical Data: Decreased mental status.  Blood sugar over 400.  CT HEAD WITHOUT CONTRAST  Technique:  Contiguous axial images were obtained from the base of the skull through the vertex without contrast.  Comparison: 07/27/2011 MR.  07/26/2011 CT.  Findings: MR detected left thalamic/midbrain infarct is now visualized by CT.  No CT evidence of new large acute thrombotic infarct or intracranial hemorrhage.  No intracranial mass lesion detected on this unenhanced exam.  Partially  empty sella incidentally noted.  Opacification right mastoid air cells without obstructing mass causing eustachian tube dysfunction detected.  IMPRESSION: MR detected left thalamic/midbrain infarct is now visualized by CT.  No CT evidence of new large acute thrombotic infarct or intracranial hemorrhage.  Opacification right mastoid air cells.  Original Report Authenticated By: Fuller Canada, M.D.   Recent Results (from the past 240 hour(s))  CULTURE, BLOOD (ROUTINE X 2)     Status: Normal   Collection Time   07/24/11  9:53 AM      Component Value Range Status Comment   Specimen Description BLOOD RIGHT HAND   Final    Special Requests BOTTLES DRAWN AEROBIC ONLY 10CC   Final    Culture  Setup Time [id number]   Final    Culture NO GROWTH 5 DAYS   Final    Report Status 07/30/2011 FINAL   Final   CULTURE, BLOOD (ROUTINE X 2)     Status: Normal   Collection Time   07/24/11 10:05 AM      Component Value Range Status Comment   Specimen Description BLOOD RIGHT ANTECUBITAL   Final    Special Requests BOTTLES DRAWN AEROBIC AND ANAEROBIC 10CC   Final    Culture  Setup Time [id number]   Final    Culture NO GROWTH 5 DAYS   Final    Report Status 07/30/2011 FINAL   Final   URINE CULTURE     Status: Normal   Collection Time   07/24/11 10:38 AM      Component Value Range Status Comment   Specimen Description URINE, CATHETERIZED   Final    Special Requests NONE   Final    Culture  Setup Time [id number]   Final    Colony Count NO GROWTH   Final    Culture NO GROWTH   Final    Report Status 07/25/2011 FINAL   Final   MRSA PCR SCREENING     Status: Normal   Collection Time   07/24/11  5:12 PM      Component Value Range Status Comment   MRSA by PCR NEGATIVE  NEGATIVE  Final   URINE CULTURE     Status: Normal   Collection Time   07/27/11 10:30 AM      Component Value Range Status Comment   Specimen Description URINE, CLEAN CATCH   Final  Special Requests NONE   Final    Culture  Setup Time  [id number]   Final    Colony Count >=100,000 COLONIES/ML   Final    Culture KLEBSIELLA PNEUMONIAE   Final    Report Status 07/30/2011 FINAL   Final    Organism ID, Bacteria KLEBSIELLA PNEUMONIAE   Final   CULTURE, BLOOD (ROUTINE X 2)     Status: Normal (Preliminary result)   Collection Time   07/27/11 10:52 AM      Component Value Range Status Comment   Specimen Description BLOOD LEFT HAND   Final    Special Requests BOTTLES DRAWN AEROBIC ONLY 8CC   Final    Culture  Setup Time [id number]   Final    Culture     Final    Value:        BLOOD CULTURE RECEIVED NO GROWTH TO DATE CULTURE WILL BE HELD FOR 5 DAYS BEFORE ISSUING A FINAL NEGATIVE REPORT   Report Status PENDING   Incomplete   CULTURE, BLOOD (ROUTINE X 2)     Status: Normal (Preliminary result)   Collection Time   07/27/11 11:02 AM      Component Value Range Status Comment   Specimen Description BLOOD LEFT HAND   Final    Special Requests BOTTLES DRAWN AEROBIC ONLY 8CC   Final    Culture  Setup Time [id number]   Final    Culture     Final    Value:        BLOOD CULTURE RECEIVED NO GROWTH TO DATE CULTURE WILL BE HELD FOR 5 DAYS BEFORE ISSUING A FINAL NEGATIVE REPORT   Report Status PENDING   Incomplete       08/01/2011, 4:04 PM     LOS: 8 days

## 2011-08-01 NOTE — Progress Notes (Signed)
Pt to Radiology for  Stat CT of head  per bed accompanied by RN - monotired Marisa Cyphers RN

## 2011-08-01 NOTE — Progress Notes (Addendum)
Called to see patient regarding mental status changes. Per nursing earlier today she was fully A+O, washing hair up by sink, doing well, ambulating fine. This afternoon she developed lethargy, aphasia and was apparently saying things that didn't quite make sense. When I asked her how she was,she told me "Britta Mccreedy said it would be all right." She went on to answer questions fairly appropriately but seemed somewhat sleepy but arousable. VSS except BP borderline low, 101 systolic. Exam NSR, no M/R/G, clear lungs. Very slight R facial droop. Able to symmetrically follow commands. 250cc NS bolus ordered & neuro called to assess patient urgently as well. Stat CT ordered. Will also place order for ABG per discussion with Dr. Patty Sermons who also evaluated patient at bedside, but at this time this is felt more likely a neurologic event. Appreciate neuro input.  Edited to add: Of note, pt's CBG was elevated this afternoon at 418. Lantus was also just re-started this morning at half home dose. We gave 9 units of Novolog with repeat CBG 474 after lunch. We gave an additional 8 units of Novolog about an hour prior to event. CBG at time of neuro changes was 450 per nursing.   Osbaldo Mark PA-C

## 2011-08-01 NOTE — Progress Notes (Addendum)
CBG 474 Orders received from [facility] PA and recheck CBG in 1 hour T [facility]

## 2011-08-01 NOTE — Progress Notes (Signed)
Patient ID: [name], female   DOB: [birth year], 61 y.o.   MRN: [id number]   SUBJECTIVE:  The patient looks brighter today. She is more communicative. Her left eye patch is in place. Neurology has left the recommendations yesterday. She is to keep the patch in place. There is recommendation for 2 month followup as an outpatient with neurology. She's not having any chest pain or shortness of breath. She was seen by physical therapy. It is my impression from the note that rehabilitation has been recommended. I am assuming that this is inpatient rehabilitation. I will request this. In addition the OT evaluation is pending.   Filed Vitals:   07/31/11 1546 07/31/11 2138 08/01/11 0421 08/01/11 0620  BP: 100/60 107/63 101/50 112/66  Pulse: 70 73 71 71  Temp:  98.5 F (36.9 C) 98.1 F (36.7 C)   TempSrc:  Oral Oral   Resp: 18 20 18    Height:      Weight:   156 lb 14.4 oz (71.169 kg)   SpO2: 95% 96% 97%     Intake/Output Summary (Last 24 hours) at 08/01/11 0732 Last data filed at 08/01/11 0620  Gross per 24 hour  Intake   1563 ml  Output   1625 ml  Net    -62 ml    LABS: Basic Metabolic Panel: No results found for this basename: NA:2,K:2,CL:2,CO2:2,GLUCOSE:2,BUN:2,CREATININE:2,CALCIUM:2,MG:2,PHOS:2 in the last 72 hours Liver Function Tests: No results found for this basename: AST:2,ALT:2,ALKPHOS:2,BILITOT:2,PROT:2,ALBUMIN:2 in the last 72 hours No results found for this basename: LIPASE:2,AMYLASE:2 in the last 72 hours CBC:  Basename 08/01/11 0500 07/31/11 0610  WBC 4.2 4.8  NEUTROABS -- --  HGB 9.5* 9.8*  HCT 29.0* 29.2*  MCV 81.7 82.7  PLT 157 150   Cardiac Enzymes: No results found for this basename: CKTOTAL:3,CKMB:3,CKMBINDEX:3,TROPONINI:3 in the last 72 hours BNP: No components found with this basename: POCBNP:3 D-Dimer: No results found for this basename: DDIMER:2 in the last 72 hours Hemoglobin A1C: No results found for this basename: HGBA1C in the last 72  hours Fasting Lipid Panel: No results found for this basename: CHOL,HDL,LDLCALC,TRIG,CHOLHDL,LDLDIRECT in the last 72 hours Thyroid Function Tests: No results found for this basename: TSH,T4TOTAL,FREET3,T3FREE,THYROIDAB in the last 72 hours  RADIOLOGY: Dg Chest 2 View  07/26/2011  *RADIOLOGY REPORT*  Clinical Data: Congestive heart failure.  Weakness.  Shortness of breath.  CHEST - 2 VIEW  Comparison:  Portable upright chest radiograph 07/25/2011.  Findings: Borderline cardiomegaly is stable.  Bilateral pleural effusions are not significantly changed.  The right pleural effusion may be loculated given its lateral and posterior position. Mild pulmonary vascular congestion is stable.  Bibasilar airspace disease is unchanged, likely representing atelectasis.  IMPRESSION:  1.  Stable appearance of bilateral pleural effusions and airspace disease. 2.  The right pleural effusion may be loculated. 3.  Stable pulmonary vascular congestion.  Original Report Authenticated By: Jamesetta Orleans. MATTERN, M.D.   Ct Head Wo Contrast  07/26/2011  *RADIOLOGY REPORT*  Clinical Data: Somnolence, confusion, slurred speech and altered mental status after cardiac catheterization.  CT HEAD WITHOUT CONTRAST  Technique:  Contiguous axial images were obtained from the base of the skull through the vertex without contrast.  Comparison: MRI of the brain on 07/25/2011.  Findings: No evidence of acute infarction.  There is some contrast in the dural sinuses related to the catheterization procedure.  No evidence of acute intracranial hemorrhage, extra-axial fluid collections, mass effect, hydrocephalus or mass lesion.  The skull is unremarkable.  IMPRESSION:  No acute findings by head CT.  Original Report Authenticated By: Reola Calkins, M.D.   Ct Head Wo Contrast  07/24/2011  *RADIOLOGY REPORT*  Clinical Data: Patient unresponsive with right gaze preference.  CT HEAD WITHOUT CONTRAST  Technique:  Contiguous axial images were  obtained from the base of the skull through the vertex without contrast.  Comparison: None.  Findings: The brain demonstrates no evidence of hemorrhage, infarction, edema, mass effect, extra-axial fluid collection, hydrocephalus or mass lesion.  The skull is unremarkable.  IMPRESSION: No acute findings by CT of the brain.  Findings were communicated directly to Dr. Fonnie Jarvis.  Original Report Authenticated By: Reola Calkins, M.D.   Mr Maxine Glenn Head Wo Contrast  07/25/2011  *RADIOLOGY REPORT*  Clinical Data:  High blood pressure.  Diabetes.  Hyperlipidemia. Presenting with transient episode of left-sided weakness and right gaze preference.  MRI HEAD WITHOUT CONTRAST MRA HEAD WITHOUT CONTRAST  Technique: Multiplanar, multiecho pulse sequences of the brain and surrounding structures were obtained according to standard protocol without intravenous contrast.  Angiographic images of the head were obtained using MRA technique without contrast.  Comparison: 07/24/2011 CT.  No comparison MR.  MRI HEAD  Findings:  No acute infarct.  No intracranial hemorrhage.  Mild small vessel disease type changes.  No intracranial mass lesion detected on this unenhanced exam.  No hydrocephalus.  Opacification mastoid air cells and middle ear cavities more notable on the right. No obstructing lesions seen in the region of posterior-superior nasopharynx.  Minimal paranasal sinus mucosal thickening.  Mild narrowing upper cervical canal.  Mild exophthalmos.  Major intracranial vascular structures are patent.  IMPRESSION: No acute infarct.  Mild small vessel disease type changes.  Opacification mastoid air cells and middle ear cavities more notable on the right.  MRA HEAD  Findings: Mild to moderate narrowing involving portions of the right internal carotid artery cavernous segment and mild narrowing and irregularity of the portions of the left internal carotid artery cavernous segment.  Small bulge M1 segment left middle cerebral artery.  This  appears to be origin of a vessel. Tiny aneurysm not entirely excluded. This is at the limitation of the present exam.  Middle cerebral artery branch vessel irregularity.  Left vertebral artery is dominant.  Mild irregularity right vertebral artery.  Mild irregularity of the basilar artery without high-grade stenosis.  Nonvisualization PICAs.  Fusiform dilated the P1 segment of the left posterior cerebral artery.  Mild narrowing P1 segment right posterior cerebral artery. Moderate to marked stenosis P1-2 segment right posterior cerebral artery.  Posterior cerebral artery distal branch vessel irregularity greater on the right.  IMPRESSION: Intracranial atherosclerotic type changes as detailed above.  Minimal bulge M1 segment left middle cerebral artery may represent origin of a vessel.  Tiny aneurysm not entirely excluded however this is at the limitation of the present exam.  Original Report Authenticated By: Fuller Canada, M.D.   Mr Brain Wo Contrast  07/27/2011  *RADIOLOGY REPORT*  Clinical Data: 61 year old female with slurred speech, confusion. Cardiac catheterization yesterday.  Altered mental status.  MRI HEAD WITHOUT CONTRAST  Technique:  Multiplanar, multiecho pulse sequences of the brain and surrounding structures were obtained according to standard protocol without intravenous contrast.  Comparison: 07/25/2011.  Findings: The examination had to be discontinued prior to completion due to patient condition.  . Confluent restricted diffusion extending from the central and medial thalamus into the ventral mid brain (series 5 images 13 - 16).  Associated T2 hyperintensity.  No mass effect  or evidence of hemorrhage.  Diffusion elsewhere is within normal limits.  No ventriculomegaly. No midline shift, mass effect, or evidence of mass lesion.  Major intracranial vascular flow voids are stable.  Gray-white matter signal outside of the area of acute ischemia is stable.  Continued right greater than left mastoid  effusion.  Fluid layering in the pharynx. Visualized orbit soft tissues are within normal limits.  Negative visualized pituitary, cervicomedullary junction, and cervical spine.  Normal bone marrow signal.  IMPRESSION: 1.  Acute left PCA small vessel infarct involving the thalamus and ventral mid brain.  No mass effect or hemorrhage. 2.  Otherwise stable since 07/25/2011.  Original Report Authenticated By: Harley Hallmark, M.D.   Mr Brain Wo Contrast  07/25/2011  *RADIOLOGY REPORT*  Clinical Data:  High blood pressure.  Diabetes.  Hyperlipidemia. Presenting with transient episode of left-sided weakness and right gaze preference.  MRI HEAD WITHOUT CONTRAST MRA HEAD WITHOUT CONTRAST  Technique: Multiplanar, multiecho pulse sequences of the brain and surrounding structures were obtained according to standard protocol without intravenous contrast.  Angiographic images of the head were obtained using MRA technique without contrast.  Comparison: 07/24/2011 CT.  No comparison MR.  MRI HEAD  Findings:  No acute infarct.  No intracranial hemorrhage.  Mild small vessel disease type changes.  No intracranial mass lesion detected on this unenhanced exam.  No hydrocephalus.  Opacification mastoid air cells and middle ear cavities more notable on the right. No obstructing lesions seen in the region of posterior-superior nasopharynx.  Minimal paranasal sinus mucosal thickening.  Mild narrowing upper cervical canal.  Mild exophthalmos.  Major intracranial vascular structures are patent.  IMPRESSION: No acute infarct.  Mild small vessel disease type changes.  Opacification mastoid air cells and middle ear cavities more notable on the right.  MRA HEAD  Findings: Mild to moderate narrowing involving portions of the right internal carotid artery cavernous segment and mild narrowing and irregularity of the portions of the left internal carotid artery cavernous segment.  Small bulge M1 segment left middle cerebral artery.  This appears  to be origin of a vessel. Tiny aneurysm not entirely excluded. This is at the limitation of the present exam.  Middle cerebral artery branch vessel irregularity.  Left vertebral artery is dominant.  Mild irregularity right vertebral artery.  Mild irregularity of the basilar artery without high-grade stenosis.  Nonvisualization PICAs.  Fusiform dilated the P1 segment of the left posterior cerebral artery.  Mild narrowing P1 segment right posterior cerebral artery. Moderate to marked stenosis P1-2 segment right posterior cerebral artery.  Posterior cerebral artery distal branch vessel irregularity greater on the right.  IMPRESSION: Intracranial atherosclerotic type changes as detailed above.  Minimal bulge M1 segment left middle cerebral artery may represent origin of a vessel.  Tiny aneurysm not entirely excluded however this is at the limitation of the present exam.  Original Report Authenticated By: Fuller Canada, M.D.   Dg Chest Port 1 View  07/25/2011  *RADIOLOGY REPORT*  Clinical Data: Congestive heart failure, improving shortness of breath  PORTABLE CHEST - 1 VIEW  Comparison: Portable chest x-ray of 07/24/2011  Findings: There may be slight improvement in the degree of moderate pulmonary vascular congestion.  Cardiomegaly, bilateral effusions, and mild congestion remain.  Median sternotomy sutures are noted.  IMPRESSION: Slight improvement in congestive heart failure pattern.  Bilateral pleural effusions remain.  Original Report Authenticated By: Juline Patch, M.D.   Dg Chest Port 1 View  07/24/2011  *RADIOLOGY REPORT*  Clinical Data: Shortness of breath.  PORTABLE CHEST - 1 VIEW  Comparison: 06/29/2011  Findings: Worsening interstitial prominence throughout the lungs, likely interstitial edema.  Mild cardiomegaly.  Small to moderate bilateral pleural effusions, right greater than left, slightly increased since prior study.  Bibasilar atelectasis.  Prior CABG.  IMPRESSION: Worsening interstitial  prominence, likely interstitial edema/CHF. Enlarging bilateral effusions.  Original Report Authenticated By: Cyndie Chime, M.D.   Dg Chest Port 1v Same Day  07/30/2011  *RADIOLOGY REPORT*  Clinical Data: CHF, pleural effusions  PORTABLE CHEST - 1 VIEW SAME DAY  Comparison: Portable exam 1154 hours compared to 07/26/2011  Findings: Upper normal heart size post CABG. Mediastinal contours normal. Improved interstitial edema. Bibasilar effusions, probably not significantly changed. Upper lungs clear. No pneumothorax or acute osseous findings.  IMPRESSION: Post CABG. Improved interstitial edema with persistent small bibasilar effusions.  Original Report Authenticated By: Lollie Marrow, M.D.    PHYSICAL EXAM   Patient is oriented to person time and place. Affect is normal. She has a patch on her left thigh. She's lying comfortably flat in bed. Lungs reveal a few scattered rhonchi. There is no jugulovenous distention. There is no respiratory distress. Cardiac exam reveals S1 and S2. There no clicks or significant murmurs. The abdomen is soft. There is no peripheral edema.   TELEMETRY: I have reviewed telemetry. There is sinus rhythm.   ASSESSMENT AND PLAN:    *Acute on chronic systolic heart failure     The patient is significantly improved. Chest x-ray July 30, 2011 revealed improvement in her pulmonary edema. However there was still pleural effusions. I&O has been approximately equal. She is on Lasix. I'm hesitant to push her diuretic dose any heart or at this point. First I will check chemistry. Decision will be made then if the Lasix dose is to be increased.   Essential hypertension, benign   DM type 2 with diabetic peripheral neuropathy     I have reviewed the note from diabetes coordinator. Patient was on insulin Lantus 35 units twice a day at home. The recommendation was to resume this at half dose. This will be started.   Altered mental status     Her mental status change had been related to  her neurologic event. This is significantly improved.   NSTEMI (non-ST elevated myocardial infarction)     Cardiac catheterization revealed that 3 of 4 grafts were open. Medical therapy is recommended.   Hyperkalemia    Potassium has been stable but on the higher side.   Cardiomyopathy     The patient is on carvedilol and hydralazine. She has had hyperkalemia from ACE inhibitor in the past. I feel it is not indicated to give her another trial of an ACE inhibitor at this point.   Hematuria    In my note on July 30, 2011 I outlined the fact that the patient had Klebsiella in her urine. I did not treat this. She has been on Biaxin. I ordered a repeat culture. The result is pending. The last for help from infectious disease on the appropriate way to approach this issue when we get the results of the second culture.   Hx of CABG    Patient has a history of CABG and osteomyelitis. She has been followed by cardiothoracic surgery here in the hospital.   CVA (cerebral infarction)    Patient is wearing a patch on her eye. No further neurologic workup is recommended at this time. She needs two-month followup as  an outpatient stroke team.  Rehabilitation:    PT has seen the patient. I will request consultation from the inpatient rehabilitation unit. I will await OT evaluation. The patient does have significant ongoing problems. At the same time she appears to be quite motivated.   Willa Rough 08/01/2011 7:32 AM

## 2011-08-01 NOTE — Progress Notes (Signed)
CBG 418 Call placed to Adolph Pollack to make aware of CBG 418. Orders received.  T Burkley Dech RN

## 2011-08-01 NOTE — Consult Note (Signed)
Physical Medicine and Rehabilitation Consult Reason for Consult: CVA Referring Physician: Dr. Myrtis Ser   HPI: [name] is a 61 y.o. 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 Admitted 07/24/2011 after being transferred from her hyperbaric oxygen therapy which was her first treatment she was receiving for treatment of her sternal osteomyelitis. Presented with left-sided weakness and right gaze preference. Oxygen saturations were in the 60s at that time. Chest x-ray with pulmonary vascular congestion/CHF. Cranial CT scan 07/24/2011 negative for acute findings. 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 Cardiac enzymes showed a troponin of 2.25 CK-MB of 8.3. Patient was placed on heparin drip as per cardiology services for suspect NSTEMI. Carotid Dopplers completed showing 40-59% right ICA stenosis and left 60-79% ICA stenosis. Echocardiogram with ejection fraction of 40% without embolus. Underwent cardiac catheterization 07/26/2011 With findings of severe triple vessel CAD with 3/4 patent bypass grafts. Plan was for continued medical management. Neurology services has been consulted in relation to his stroke he is presently maintained on aspirin therapy. Physical therapy ongoing with recommendations for physical medicine rehabilitation consult to consider inpatient rehabilitation services.  Review of Systems  Eyes: Positive for double vision.  Respiratory: Positive for shortness of breath.   Cardiovascular: Positive for chest pain and palpitations.  Gastrointestinal: Positive for constipation.  Musculoskeletal: Positive for myalgias.  Psychiatric/Behavioral: Positive for depression.  All other systems reviewed and are negative.   Past Medical History  Diagnosis Date  . Coronary atherosclerosis of native  coronary artery     BMS circ 2001, DES LAD 2005, DES LAD and RCA 2008, subsequent CABG  . Drug allergy     Allergy to Plavix (hives) - took Ticlid in past  . Essential hypertension, benign   . Mixed hyperlipidemia   . Type 2 diabetes mellitus   . Myocardial infarction ~ 2002  . Arthritis     rheumatoid  . Depression   . Peripheral neuropathy   . Peptic ulcer disease ~ 1975  . Nephrolithiasis 02/07/2011  . Angina   . Breast cancer     Chemo and mastectomy- right  . Diabetes mellitus   . Chronic systolic heart failure     LVEF 40-45%  . Anxiety   . Sternal wound infection     CABG wound   Past Surgical History  Procedure Date  . Mastectomy 1988    right  . Cholecystectomy 1969  . Abdominal hysterectomy 1989  . Tonsillectomy and adenoidectomy 1979  . Appendectomy 1969  . Tubal ligation 1972  . Coronary artery bypass graft 01/11/2011    Procedure: CORONARY ARTERY BYPASS GRAFTING (CABG);  Surgeon: Kathlee Nations Suann Larry, MD;  Location: [facility] OR;  Service: Open Heart Surgery;  Laterality: N/A;  cabg x four, using left internal mammary artery and right leg greater saphenous vein harvestede endoscopically  . Sternal wound debridement 03/01/2011    Procedure: STERNAL WOUND DEBRIDEMENT;  Surgeon: Kathlee Nations Suann Larry, MD;  Location: [facility] OR;  Service: Open Heart Surgery;  Laterality: N/A;  . Application of wound vac 03/01/2011    Procedure: APPLICATION OF WOUND VAC;  Surgeon: Kathlee Nations Suann Larry, MD;  Location: [facility] OR;  Service: Open Heart Surgery;  Laterality: N/A;  . Breast surgery   . Sternal wires removal 04/02/2011    Procedure: STERNAL WIRES REMOVAL;  Surgeon: Kathlee Nations Suann Larry, MD;  Location: [facility] OR;  Service: Open Heart Surgery;  Laterality: N/A;   Family History  Problem Relation Age of Onset  . Anesthesia problems Neg Hx    Social History:  reports that she quit smoking about 4 years ago. Her smoking use included Cigarettes. She has a 20 pack-year smoking history. She has never used  smokeless tobacco. She reports that she does not drink alcohol or use illicit drugs. Allergies:  Allergies  Allergen Reactions  . Clopidogrel Bisulfate Hives  . Codeine Nausea And Vomiting  . Morphine Nausea And Vomiting   Medications Prior to Admission  Medication Sig Dispense Refill  . aspirin 325 MG tablet Take 325 mg by mouth daily.       . carvedilol (COREG) 3.125 MG tablet Take 1 tablet (3.125 mg total) by mouth 2 (two) times daily.  180 tablet  3  . clarithromycin (BIAXIN) 500 MG tablet Take 500 mg by mouth 2 (two) times daily. Started at wound clinic      . gabapentin (NEURONTIN) 300 MG capsule Take 300 mg by mouth 3 (three) times daily.       . insulin glargine (LANTUS) 100 UNIT/ML injection Inject 35 Units into the skin 2 (two) times daily.       . insulin lispro (HUMALOG) 100 UNIT/ML injection Inject 20 Units into the skin 3 (three) times daily before meals.       . Multiple Vitamin (MULITIVITAMIN WITH MINERALS) TABS Take 1 tablet by mouth daily.      Marland Kitchen PARoxetine (PAXIL) 20 MG tablet Take 20 mg by mouth every morning.       . simvastatin (ZOCOR) 20 MG tablet Take 1 tablet (20 mg total) by mouth at bedtime.  90 tablet  1  . traMADol (ULTRAM) 50 MG tablet Take 50 mg by mouth every 6 (six) hours as needed. For pain        Home: Home Living Lives With: Spouse Available Help at Discharge: Family Type of Home: House Home Access: Level entry Home Layout: Two level;Able to live on main level with bedroom/bathroom Bathroom Shower/Tub: Health visitor: Handicapped height Home Adaptive Equipment: None  Functional History: Prior Function Able to Take Stairs?: Yes Driving: Yes Functional Status:  Mobility: Bed Mobility Bed Mobility: Supine to Sit;Sitting - Scoot to Edge of Bed Supine to Sit: 4: Min guard;With rails Sitting - Scoot to Edge of Bed: 4: Min guard;With rail Transfers Transfers: Sit to Stand;Stand to Sit Sit to Stand: 4: Min guard Stand to Sit: 4:  Min assist Ambulation/Gait Ambulation/Gait Assistance: 4: Min assist;Other (comment) (to min guard) Ambulation Distance (Feet): 180 Feet Assistive device: 1 person hand held assist;None Ambulation/Gait Assistance Details: gait characterized as guarded with UE's at mid guard position and trunk stiff.  Whether scanning or not pt listed and wandered R.  Instability worsened with scanning. Gait Pattern: Step-through pattern;Decreased step length - right;Decreased step length - left;Decreased stride length (wandering R) Gait velocity: slow cadence and not significant speed increase when cued to do so. Stairs: No Wheelchair Mobility Wheelchair Mobility: No  ADL:    Cognition: Cognition Arousal/Alertness: Awake/alert Orientation Level: Oriented X4 Cognition Overall Cognitive Status: Appears within functional limits for tasks assessed/performed Arousal/Alertness: Awake/alert Orientation Level: Appears intact for tasks assessed Behavior During Session: [facility] for tasks performed  Blood pressure 112/66, pulse 71, temperature 98.1 F (36.7 C), temperature source Oral, resp. rate 18, height 5\' 5"  (1.651 m), weight 71.169 kg (156 lb 14.4 oz), last menstrual period 01/05/1996, SpO2  97.00%. Physical Exam  Constitutional: She is oriented to person, place, and time.  HENT:  Head: Normocephalic.  Eyes:       Left eye patch in place  Neck: Neck supple. No thyromegaly present.  Cardiovascular: Regular rhythm.   Pulmonary/Chest: Breath sounds normal. She has no wheezes.  Abdominal: She exhibits no distension.  Musculoskeletal: She exhibits no edema.  Neurological: She is alert and oriented to person, place, and time.  Skin:       Chest incision was some mild redness without drainage well approximated.  Psychiatric: She has a normal mood and affect.  sit to stand is with supervision assistance. Motor strength is 4/5 in bilateral deltoid bicep tricep grip as well as hip flexors knee extensors ankle  dorsiflexors Sensation is intact to light touch bilateral upper lobes remedies Visual fields are intact to confrontation testing extraocular muscles are intact  Results for orders placed during the hospital encounter of 07/24/11 (from the past 24 hour(s))  GLUCOSE, CAPILLARY     Status: Abnormal   Collection Time   07/31/11 10:53 AM      Component Value Range   Glucose-Capillary 320 (*) 70 - 99 (mg/dL)  GLUCOSE, CAPILLARY     Status: Abnormal   Collection Time   07/31/11  4:29 PM      Component Value Range   Glucose-Capillary 396 (*) 70 - 99 (mg/dL)   Comment 1 Notify RN    GLUCOSE, CAPILLARY     Status: Abnormal   Collection Time   07/31/11  9:42 PM      Component Value Range   Glucose-Capillary 339 (*) 70 - 99 (mg/dL)   Comment 1 Notify RN    CBC     Status: Abnormal   Collection Time   08/01/11  5:00 AM      Component Value Range   WBC 4.2  4.0 - 10.5 (K/uL)   RBC 3.55 (*) 3.87 - 5.11 (MIL/uL)   Hemoglobin 9.5 (*) 12.0 - 15.0 (g/dL)   HCT 96.0 (*) 45.4 - 46.0 (%)   MCV 81.7  78.0 - 100.0 (fL)   MCH 26.8  26.0 - 34.0 (pg)   MCHC 32.8  30.0 - 36.0 (g/dL)   RDW 09.8  11.9 - 14.7 (%)   Platelets 157  150 - 400 (K/uL)  PRO B NATRIURETIC PEPTIDE     Status: Abnormal   Collection Time   08/01/11  6:00 AM      Component Value Range   Pro B Natriuretic peptide (BNP) 1076.0 (*) 0 - 125 (pg/mL)  GLUCOSE, CAPILLARY     Status: Abnormal   Collection Time   08/01/11  6:23 AM      Component Value Range   Glucose-Capillary 329 (*) 70 - 99 (mg/dL)   Dg Chest Port 1v Same Day  07/30/2011  *RADIOLOGY REPORT*  Clinical Data: CHF, pleural effusions  PORTABLE CHEST - 1 VIEW SAME DAY  Comparison: Portable exam 1154 hours compared to 07/26/2011  Findings: Upper normal heart size post CABG. Mediastinal contours normal. Improved interstitial edema. Bibasilar effusions, probably not significantly changed. Upper lungs clear. No pneumothorax or acute osseous findings.  IMPRESSION: Post CABG. Improved  interstitial edema with persistent small bibasilar effusions.  Original Report Authenticated By: Lollie Marrow, M.D.    Assessment/Plan: Diagnosis: left thalamic infarct and deconditioning 1. Does the need for close, 24 hr/day medical supervision in concert with the patient's rehab needs make it unreasonable for this patient to be served in  a less intensive setting? No 2. Co-Morbidities requiring supervision/potential complications: coronary ovary disease, hypertension, history of congestive heart failure 3. Due to skin/wound care, disease management and medication administration, does the patient require 24 hr/day rehab nursing? Potentially 4. Does the patient require coordinated care of a physician, rehab nurse, not applicable to address physical and functional deficits in the context of the above medical diagnosis(es)? No Addressing deficits in the following areas: locomotion, strength, transferring, bathing and dressing 5. Can the patient actively participate in an intensive therapy program of at least 3 hrs of therapy per day at least 5 days per week? Yes 6. The potential for patient to make measurable gains while on inpatient rehab is fair 7. Anticipated functional outcomes upon discharge from inpatient rehab are not applicable with PT, not applicable with OT, not applicable with SLP. 8. Estimated rehab length of stay to reach the above functional goals is: not applicable 9. Does the patient have adequate social supports to accommodate these discharge functional goals? Yes 10. Anticipated D/C setting: Home 11. Anticipated post D/C treatments: Outpt therapy 12. Overall Rehab/Functional Prognosis: excellent  RECOMMENDATIONS: This patient's condition is appropriate for continued rehabilitative care in the following setting: Outpt Patient has agreed to participate in recommended program. Yes Note that insurance prior authorization may be required for reimbursement for recommended  care.  Comment:to high-level for inpatient rehabilitation. Recommend outpatient therapy with PT and OT    08/01/2011

## 2011-08-01 NOTE — Progress Notes (Signed)
Subjective: Patient with history of acute infarct.  Followed by the stroke service until recently.  Today noted to have acute onset dysarthria and lethargy by nursing.  Was asked to evaluate patient.  BS's elevated as well.  Objective: Current vital signs: BP 110/60  Pulse 74  Temp(Src) 98.2 F (36.8 C) (Oral)  Resp 18  Ht 5\' 5"  (1.651 m)  Wt 71.169 kg (156 lb 14.4 oz)  BMI 26.11 kg/m2  SpO2 94%  LMP 01/05/1996 Vital signs in last 24 hours: Temp:  [97.5 F (36.4 C)-98.5 F (36.9 C)] 98.2 F (36.8 C) (06/05 1352) Pulse Rate:  [71-74] 74  (06/05 1352) Resp:  [18-20] 18  (06/05 1352) BP: (100-116)/(50-68) 110/60 mmHg (06/05 1352) SpO2:  [94 %-97 %] 94 % (06/05 1352) Weight:  [71.169 kg (156 lb 14.4 oz)] 71.169 kg (156 lb 14.4 oz) (06/05 0421)  Intake/Output from previous day: 06/04 0701 - 06/05 0700 In: 1563 [P.O.:1560; I.V.:3] Out: 1625 [Urine:1625] Intake/Output this shift: Total I/O In: 480 [P.O.:480] Out: 400 [Urine:400] Nutritional status: Carb Control  Neurologic Exam: Mental Status: Lethargic.  Speech slurred.  Fluent.  Follows commands. Cranial Nerves: II: visual fields grossly normal, pupils equal, round, reactive to light and accommodation III,IV, VI: Extra-ocular motions intact on right.  Left eye patched V,VII: smile symmetric, facial light touch sensation normal bilaterally VIII: hearing normal bilaterally IX,X: gag reflex present XI: trapezius strength/neck flexion strength normal bilaterally XII: tongue strength normal  Motor: Poor effort but moves all extremities fairly symmetrically. Sensory: Pinprick and light touch intact throughout, bilaterally  Lab Results: Results for orders placed during the hospital encounter of 07/24/11 (from the past 48 hour(s))  GLUCOSE, CAPILLARY     Status: Abnormal   Collection Time   07/30/11  4:31 PM      Component Value Range Comment   Glucose-Capillary 225 (*) 70 - 99 (mg/dL)    Comment 1 Notify RN     GLUCOSE,  CAPILLARY     Status: Abnormal   Collection Time   07/30/11  9:33 PM      Component Value Range Comment   Glucose-Capillary 326 (*) 70 - 99 (mg/dL)    Comment 1 Notify RN      Comment 2 Documented in Chart     CBC     Status: Abnormal   Collection Time   07/31/11  6:10 AM      Component Value Range Comment   WBC 4.8  4.0 - 10.5 (K/uL)    RBC 3.53 (*) 3.87 - 5.11 (MIL/uL)    Hemoglobin 9.8 (*) 12.0 - 15.0 (g/dL)    HCT 91.4 (*) 78.2 - 46.0 (%)    MCV 82.7  78.0 - 100.0 (fL)    MCH 27.8  26.0 - 34.0 (pg)    MCHC 33.6  30.0 - 36.0 (g/dL)    RDW 95.6  21.3 - 08.6 (%)    Platelets 150  150 - 400 (K/uL)   PRO B NATRIURETIC PEPTIDE     Status: Abnormal   Collection Time   07/31/11  6:10 AM      Component Value Range Comment   Pro B Natriuretic peptide (BNP) 1141.0 (*) 0 - 125 (pg/mL)   GLUCOSE, CAPILLARY     Status: Abnormal   Collection Time   07/31/11  6:49 AM      Component Value Range Comment   Glucose-Capillary 310 (*) 70 - 99 (mg/dL)   GLUCOSE, CAPILLARY     Status: Abnormal  Collection Time   07/31/11 10:53 AM      Component Value Range Comment   Glucose-Capillary 320 (*) 70 - 99 (mg/dL)   GLUCOSE, CAPILLARY     Status: Abnormal   Collection Time   07/31/11  4:29 PM      Component Value Range Comment   Glucose-Capillary 396 (*) 70 - 99 (mg/dL)    Comment 1 Notify RN     GLUCOSE, CAPILLARY     Status: Abnormal   Collection Time   07/31/11  9:42 PM      Component Value Range Comment   Glucose-Capillary 339 (*) 70 - 99 (mg/dL)    Comment 1 Notify RN     CBC     Status: Abnormal   Collection Time   08/01/11  5:00 AM      Component Value Range Comment   WBC 4.2  4.0 - 10.5 (K/uL)    RBC 3.55 (*) 3.87 - 5.11 (MIL/uL)    Hemoglobin 9.5 (*) 12.0 - 15.0 (g/dL)    HCT 16.1 (*) 09.6 - 46.0 (%)    MCV 81.7  78.0 - 100.0 (fL)    MCH 26.8  26.0 - 34.0 (pg)    MCHC 32.8  30.0 - 36.0 (g/dL)    RDW 04.5  40.9 - 81.1 (%)    Platelets 157  150 - 400 (K/uL)   PRO B NATRIURETIC PEPTIDE      Status: Abnormal   Collection Time   08/01/11  6:00 AM      Component Value Range Comment   Pro B Natriuretic peptide (BNP) 1076.0 (*) 0 - 125 (pg/mL)   GLUCOSE, CAPILLARY     Status: Abnormal   Collection Time   08/01/11  6:23 AM      Component Value Range Comment   Glucose-Capillary 329 (*) 70 - 99 (mg/dL)   GLUCOSE, CAPILLARY     Status: Abnormal   Collection Time   08/01/11 11:52 AM      Component Value Range Comment   Glucose-Capillary 418 (*) 70 - 99 (mg/dL)    Comment 1 Documented in Chart      Comment 2 Notify RN     BASIC METABOLIC PANEL     Status: Abnormal   Collection Time   08/01/11  3:20 PM      Component Value Range Comment   Sodium 133 (*) 135 - 145 (mEq/L)    Potassium 4.2  3.5 - 5.1 (mEq/L)    Chloride 95 (*) 96 - 112 (mEq/L)    CO2 29  19 - 32 (mEq/L)    Glucose, Bld 413 (*) 70 - 99 (mg/dL)    BUN 37 (*) 6 - 23 (mg/dL)    Creatinine, Ser 9.14  0.50 - 1.10 (mg/dL)    Calcium 9.0  8.4 - 10.5 (mg/dL)    GFR calc non Af Amer 55 (*) >90 (mL/min)    GFR calc Af Amer 64 (*) >90 (mL/min)     Recent Results (from the past 240 hour(s))  CULTURE, BLOOD (ROUTINE X 2)     Status: Normal   Collection Time   07/24/11  9:53 AM      Component Value Range Status Comment   Specimen Description BLOOD RIGHT HAND   Final    Special Requests BOTTLES DRAWN AEROBIC ONLY 10CC   Final    Culture  Setup Time [id number]   Final    Culture NO GROWTH 5 DAYS   Final  Report Status 07/30/2011 FINAL   Final   CULTURE, BLOOD (ROUTINE X 2)     Status: Normal   Collection Time   07/24/11 10:05 AM      Component Value Range Status Comment   Specimen Description BLOOD RIGHT ANTECUBITAL   Final    Special Requests BOTTLES DRAWN AEROBIC AND ANAEROBIC 10CC   Final    Culture  Setup Time [id number]   Final    Culture NO GROWTH 5 DAYS   Final    Report Status 07/30/2011 FINAL   Final   URINE CULTURE     Status: Normal   Collection Time   07/24/11 10:38 AM      Component Value Range Status  Comment   Specimen Description URINE, CATHETERIZED   Final    Special Requests NONE   Final    Culture  Setup Time [id number]   Final    Colony Count NO GROWTH   Final    Culture NO GROWTH   Final    Report Status 07/25/2011 FINAL   Final   MRSA PCR SCREENING     Status: Normal   Collection Time   07/24/11  5:12 PM      Component Value Range Status Comment   MRSA by PCR NEGATIVE  NEGATIVE  Final   URINE CULTURE     Status: Normal   Collection Time   07/27/11 10:30 AM      Component Value Range Status Comment   Specimen Description URINE, CLEAN CATCH   Final    Special Requests NONE   Final    Culture  Setup Time [id number]   Final    Colony Count >=100,000 COLONIES/ML   Final    Culture KLEBSIELLA PNEUMONIAE   Final    Report Status 07/30/2011 FINAL   Final    Organism ID, Bacteria KLEBSIELLA PNEUMONIAE   Final   CULTURE, BLOOD (ROUTINE X 2)     Status: Normal (Preliminary result)   Collection Time   07/27/11 10:52 AM      Component Value Range Status Comment   Specimen Description BLOOD LEFT HAND   Final    Special Requests BOTTLES DRAWN AEROBIC ONLY 8CC   Final    Culture  Setup Time [id number]   Final    Culture     Final    Value:        BLOOD CULTURE RECEIVED NO GROWTH TO DATE CULTURE WILL BE HELD FOR 5 DAYS BEFORE ISSUING A FINAL NEGATIVE REPORT   Report Status PENDING   Incomplete   CULTURE, BLOOD (ROUTINE X 2)     Status: Normal (Preliminary result)   Collection Time   07/27/11 11:02 AM      Component Value Range Status Comment   Specimen Description BLOOD LEFT HAND   Final    Special Requests BOTTLES DRAWN AEROBIC ONLY 8CC   Final    Culture  Setup Time [id number]   Final    Culture     Final    Value:        BLOOD CULTURE RECEIVED NO GROWTH TO DATE CULTURE WILL BE HELD FOR 5 DAYS BEFORE ISSUING A FINAL NEGATIVE REPORT   Report Status PENDING   Incomplete     Lipid Panel No results found for this basename: CHOL,TRIG,HDL,CHOLHDL,VLDL,LDLCALC in the last 72  hours  Studies/Results: Ct Head Wo Contrast  08/01/2011  *RADIOLOGY REPORT*  Clinical Data: Decreased mental status.  Blood sugar over 400.  CT HEAD WITHOUT  CONTRAST  Technique:  Contiguous axial images were obtained from the base of the skull through the vertex without contrast.  Comparison: 07/27/2011 MR.  07/26/2011 CT.  Findings: MR detected left thalamic/midbrain infarct is now visualized by CT.  No CT evidence of new large acute thrombotic infarct or intracranial hemorrhage.  No intracranial mass lesion detected on this unenhanced exam.  Partially empty sella incidentally noted.  Opacification right mastoid air cells without obstructing mass causing eustachian tube dysfunction detected.  IMPRESSION: MR detected left thalamic/midbrain infarct is now visualized by CT.  No CT evidence of new large acute thrombotic infarct or intracranial hemorrhage.  Opacification right mastoid air cells.  Original Report Authenticated By: Fuller Canada, M.D.    Medications:  I have reviewed the patient's current medications. Scheduled:   . aspirin  325 mg Oral Daily  . carvedilol  6.25 mg Oral BID  . clarithromycin  500 mg Oral BID WC  . furosemide  40 mg Oral Daily  . gabapentin  300 mg Oral TID  . hydrALAZINE  12.5 mg Oral Q8H  . insulin aspart  0-9 Units Subcutaneous TID WC  . insulin aspart  8 Units Subcutaneous Once  . insulin glargine  17 Units Subcutaneous BID  . isosorbide mononitrate  30 mg Oral Daily  . mulitivitamin with minerals  1 tablet Oral Daily  . PARoxetine  20 mg Oral q morning - 10a  . pravastatin  40 mg Oral Daily  . sodium chloride  250 mL Intravenous Once  . sodium chloride  3 mL Intravenous Q12H    Assessment/Plan:  Patient Active Hospital Problem List: Acute lethargy and slurred speech   Assessment:  Question as to whether there was an acute ischemic event vs. hemorrhage causing symptoms.  Other metabolic causes remain in the differential as well.   Plan:  Stat head CT  without contrast ordered.  Addendum:  No acute findings on head CT.  Medical work up to continue.  Will follow up in AM       LOS: 8 days   Thana Farr, MD [facility] [phone] 08/01/2011  4:25 PM

## 2011-08-01 NOTE — Evaluation (Signed)
Occupational Therapy Evaluation Patient Details Name: [name] MRN: [id number] DOB: March 27, 1950 Today's Date: 08/01/2011 Time: 9147-8295 OT Time Calculation (min): 27 min  OT Assessment / Plan / Recommendation Clinical Impression  Pt. presents with SOB and diaphoresis.  MRI showed evidence of L PCA distribution infarct.  Presently visual changes remain though moderated by an eye patch. Pt. will benefit from skilled OT to increase functional independence with ADLs and get pt. to supervision level at D/C home.    OT Assessment  Patient needs continued OT Services    Follow Up Recommendations  Outpatient OT    Barriers to Discharge None    Equipment Recommendations  None recommended by OT       Frequency  Min 2X/week    Precautions / Restrictions Precautions Precautions: Fall       ADL  Eating/Feeding: Simulated;Set up Where Assessed - Eating/Feeding: Chair Grooming: Performed;Wash/dry hands;Wash/dry face;Teeth care;Set up;Minimal assistance (Pt. with rt. lateral lean in standing) Where Assessed - Grooming: Supported standing Upper Body Bathing: Simulated;Set up Where Assessed - Upper Body Bathing: Supported sitting Lower Body Bathing: Simulated;Set up Where Assessed - Lower Body Bathing: Unsupported sitting Upper Body Dressing: Performed;Set up Where Assessed - Upper Body Dressing: Unsupported sitting Lower Body Dressing: Simulated;Set up Where Assessed - Lower Body Dressing: Unsupported sitting Toilet Transfer: Performed;Minimal assistance Toilet Transfer Method:  (ambulating) Toilet Transfer Equipment: Regular height toilet;Grab bars Toileting - Clothing Manipulation and Hygiene: Simulated;Set up Where Assessed - Toileting Clothing Manipulation and Hygiene: Sit on 3-in-1 or toilet Tub/Shower Transfer Method: Not assessed Transfers/Ambulation Related to ADLs: Pt. provided with min HHA due to rt. lateral lean and vision deficits. ADL Comments: Pt. completed LB ADLs  by crossing foot over opposite knee to complete. Pt. completed vision exercises to improve function and safety due to diplopia.  (Educated pt. on use of eye patch switch 2 hrs. )    OT Diagnosis: Disturbance of vision;Generalized weakness  OT Problem List: Decreased strength;Decreased activity tolerance;Impaired balance (sitting and/or standing);Impaired vision/perception;Decreased safety awareness;Decreased knowledge of precautions OT Treatment Interventions: Self-care/ADL training;DME and/or AE instruction;Therapeutic activities;Visual/perceptual remediation/compensation;Balance training;Patient/family education   OT Goals Acute Rehab OT Goals OT Goal Formulation: With patient Time For Goal Achievement: 08/08/11 Potential to Achieve Goals: Good ADL Goals Pt Will Perform Grooming: with set-up;with supervision;Standing at sink ADL Goal: Grooming - Progress: Goal set today Pt Will Transfer to Toilet: with supervision;Ambulation;with DME ADL Goal: Toilet Transfer - Progress: Goal set today Additional ADL Goal #1: Pt. will complete eye stretch exercises with supervision to improve functional vision. ADL Goal: Additional Goal #1 - Progress: Goal set today Additional ADL Goal #2: Pt. will demonstrate head turn to assess environment independently to improve pt. safety with use of eye patch. ADL Goal: Additional Goal #2 - Progress: Goal set today  Visit Information  Last OT Received On: 08/01/11 Assistance Needed: +1    Subjective Data  Subjective: "I have been in and out of here so much" Patient Stated Goal: "Get better"   Prior Functioning  Home Living Lives With: Spouse Available Help at Discharge: Family Type of Home: House Home Access: Level entry Home Layout: Two level;Able to live on main level with bedroom/bathroom Bathroom Shower/Tub: Health visitor: Handicapped height Home Adaptive Equipment: None Prior Function Level of Independence: Independent Able to  Take Stairs?: Yes Driving: Yes Vocation: Retired    IT consultant  Overall Cognitive Status: Appears within functional limits for tasks assessed/performed Arousal/Alertness: Awake/alert Orientation Level: Appears intact for tasks assessed Behavior  During Session: [facility] for tasks performed    Extremity/Trunk Assessment Right Upper Extremity Assessment RUE ROM/Strength/Tone: Deficits RUE ROM/Strength/Tone Deficits: Minor delay with functional tasks however grossly 4/5 RUE Sensation: WFL - Light Touch RUE Coordination: WFL - gross/fine motor Left Upper Extremity Assessment LUE ROM/Strength/Tone: Within functional levels LUE Sensation: WFL - Light Touch LUE Coordination: WFL - gross/fine motor   Mobility Bed Mobility Bed Mobility: Not assessed Transfers Transfers: Sit to Stand;Stand to Sit Sit to Stand: 4: Min guard Stand to Sit: 4: Min assist Details for Transfer Assistance: Min verbal cues for safe hand placement   Exercise Other Exercises Other Exercises: Pt. completed bil eye exercises holding each eye stretch for 8 seconds and will complete 3x a day. Pt. covered one eye and completed holding eye stretch in each direction moving in 45 degrees each time. Pt. verbalized and demonstrated understanding of exercises.     End of Session OT - End of Session Equipment Utilized During Treatment: Gait belt Activity Tolerance: Patient tolerated treatment well Patient left: in chair;with call bell/phone within reach Nurse Communication: Mobility status   Latrelle Fuston, OTR/L Pager [phone] 08/01/2011, 1:31 PM

## 2011-08-01 NOTE — Progress Notes (Signed)
Neuro and cardiology here to access pt. Orders received. Started 250 cc NSS bolus IV Marisa Cyphers RN

## 2011-08-01 NOTE — Progress Notes (Signed)
Call to neuro on call to come and evaluate pt. @ 1415 Call to [facility] to come and evaluate since pt was having high blood sugars today T Zynasia Burklow RN

## 2011-08-02 DIAGNOSIS — I472 Ventricular tachycardia, unspecified: Secondary | ICD-10-CM

## 2011-08-02 DIAGNOSIS — I6350 Cerebral infarction due to unspecified occlusion or stenosis of unspecified cerebral artery: Secondary | ICD-10-CM

## 2011-08-02 LAB — CULTURE, BLOOD (ROUTINE X 2)
Culture  Setup Time: 201305312205
Culture: NO GROWTH

## 2011-08-02 LAB — URINE CULTURE

## 2011-08-02 LAB — BASIC METABOLIC PANEL
GFR calc non Af Amer: 68 mL/min — ABNORMAL LOW (ref >90)
Glucose, Bld: 263 mg/dL — ABNORMAL HIGH (ref 70–99)
Potassium: 4.4 mEq/L (ref 3.5–5.1)
Sodium: 132 mEq/L — ABNORMAL LOW (ref 135–145)

## 2011-08-02 LAB — CBC
Hemoglobin: 9.5 g/dL — ABNORMAL LOW (ref 12.0–15.0)
MCHC: 33.3 g/dL (ref 30.0–36.0)
RBC: 3.46 MIL/uL — ABNORMAL LOW (ref 3.87–5.11)
WBC: 4.7 10*3/uL (ref 4.0–10.5)

## 2011-08-02 LAB — GLUCOSE, CAPILLARY
Glucose-Capillary: 292 mg/dL — ABNORMAL HIGH (ref 70–99)
Glucose-Capillary: 256 mg/dL — ABNORMAL HIGH (ref 70–99)
Glucose-Capillary: 288 mg/dL — ABNORMAL HIGH (ref 70–99)

## 2011-08-02 MED ORDER — INSULIN GLARGINE 100 UNIT/ML ~~LOC~~ SOLN
25.0000 [IU] | Freq: Two times a day (BID) | SUBCUTANEOUS | Status: DC
  Administered 2011-08-02 – 2011-08-03 (×2): 25 [IU] via SUBCUTANEOUS

## 2011-08-02 NOTE — Progress Notes (Signed)
Patient had a 5 beat run of v tach. Patient asymptomatic. BP: 103/60, HR: 81. Pt sitting on the side of the bed and states that she feels fine. MD on call notified. Orders given for a BMET in the am. Will continue to monitor.

## 2011-08-02 NOTE — Progress Notes (Signed)
Per rehab MD consult on 08/01/11, patient is not appropriate for CIR based on she is actually at too high level of functioning. Patient aware and will sign off from CIR at this time. Marina Goodell adm coordinator [phone]

## 2011-08-02 NOTE — Progress Notes (Signed)
Patient ID: [name], female   DOB: May 02, 1950, 61 y.o.   MRN: [id number]   SUBJECTIVE: I have reviewed all the notes from yesterday. The patient was seen by inpatient rehabilitation. It was felt that she was high enough functioning that she did not need inpatient rehabilitation. Outpatient OT and PT were recommended. Later in the day the patient had dysarthria and slurring of her speech. She did have elevated glucose. Etiology was not clear. She was seen by neurology. Head CT revealed no acute change. The plan was to continue to watch her in the neurology would see her again today. This morning she feels better. Her speech is back to normal.   Filed Vitals:   08/01/11 2151 08/01/11 2154 08/02/11 0453 08/02/11 1054  BP: 108/64  104/53 110/60  Pulse:  78 71 70  Temp:   97.8 F (36.6 C)   TempSrc:   Oral   Resp:   18   Height:      Weight:   156 lb 8 oz (70.988 kg)   SpO2:   95%     Intake/Output Summary (Last 24 hours) at 08/02/11 1202 Last data filed at 08/02/11 1308  Gross per 24 hour  Intake   1210 ml  Output   1650 ml  Net   -440 ml    LABS: Basic Metabolic Panel:  Basename 08/02/11 0542 08/01/11 1520  NA 132* 133*  K 4.4 4.2  CL 93* 95*  CO2 25 29  GLUCOSE 263* 413*  BUN 31* 37*  CREATININE 0.90 1.07  CALCIUM 9.3 9.0  MG -- --  PHOS -- --   Liver Function Tests: No results found for this basename: AST:2,ALT:2,ALKPHOS:2,BILITOT:2,PROT:2,ALBUMIN:2 in the last 72 hours No results found for this basename: LIPASE:2,AMYLASE:2 in the last 72 hours CBC:  Basename 08/02/11 0542 08/01/11 0500  WBC 4.7 4.2  NEUTROABS -- --  HGB 9.5* 9.5*  HCT 28.5* 29.0*  MCV 82.4 81.7  PLT 164 157   Cardiac Enzymes: No results found for this basename: CKTOTAL:3,CKMB:3,CKMBINDEX:3,TROPONINI:3 in the last 72 hours BNP: No components found with this basename: POCBNP:3 D-Dimer: No results found for this basename: DDIMER:2 in the last 72 hours Hemoglobin A1C: No results found  for this basename: HGBA1C in the last 72 hours Fasting Lipid Panel: No results found for this basename: CHOL,HDL,LDLCALC,TRIG,CHOLHDL,LDLDIRECT in the last 72 hours Thyroid Function Tests: No results found for this basename: TSH,T4TOTAL,FREET3,T3FREE,THYROIDAB in the last 72 hours  RADIOLOGY: Dg Chest 2 View  07/26/2011  *RADIOLOGY REPORT*  Clinical Data: Congestive heart failure.  Weakness.  Shortness of breath.  CHEST - 2 VIEW  Comparison:  Portable upright chest radiograph 07/25/2011.  Findings: Borderline cardiomegaly is stable.  Bilateral pleural effusions are not significantly changed.  The right pleural effusion may be loculated given its lateral and posterior position. Mild pulmonary vascular congestion is stable.  Bibasilar airspace disease is unchanged, likely representing atelectasis.  IMPRESSION:  1.  Stable appearance of bilateral pleural effusions and airspace disease. 2.  The right pleural effusion may be loculated. 3.  Stable pulmonary vascular congestion.  Original Report Authenticated By: Jamesetta Orleans. MATTERN, M.D.   Ct Head Wo Contrast  08/01/2011  *RADIOLOGY REPORT*  Clinical Data: Decreased mental status.  Blood sugar over 400.  CT HEAD WITHOUT CONTRAST  Technique:  Contiguous axial images were obtained from the base of the skull through the vertex without contrast.  Comparison: 07/27/2011 MR.  07/26/2011 CT.  Findings: MR detected left thalamic/midbrain infarct is now  visualized by CT.  No CT evidence of new large acute thrombotic infarct or intracranial hemorrhage.  No intracranial mass lesion detected on this unenhanced exam.  Partially empty sella incidentally noted.  Opacification right mastoid air cells without obstructing mass causing eustachian tube dysfunction detected.  IMPRESSION: MR detected left thalamic/midbrain infarct is now visualized by CT.  No CT evidence of new large acute thrombotic infarct or intracranial hemorrhage.  Opacification right mastoid air cells.   Original Report Authenticated By: Fuller Canada, M.D.   Ct Head Wo Contrast  07/26/2011  *RADIOLOGY REPORT*  Clinical Data: Somnolence, confusion, slurred speech and altered mental status after cardiac catheterization.  CT HEAD WITHOUT CONTRAST  Technique:  Contiguous axial images were obtained from the base of the skull through the vertex without contrast.  Comparison: MRI of the brain on 07/25/2011.  Findings: No evidence of acute infarction.  There is some contrast in the dural sinuses related to the catheterization procedure.  No evidence of acute intracranial hemorrhage, extra-axial fluid collections, mass effect, hydrocephalus or mass lesion.  The skull is unremarkable.  IMPRESSION: No acute findings by head CT.  Original Report Authenticated By: Reola Calkins, M.D.   Ct Head Wo Contrast  07/24/2011  *RADIOLOGY REPORT*  Clinical Data: Patient unresponsive with right gaze preference.  CT HEAD WITHOUT CONTRAST  Technique:  Contiguous axial images were obtained from the base of the skull through the vertex without contrast.  Comparison: None.  Findings: The brain demonstrates no evidence of hemorrhage, infarction, edema, mass effect, extra-axial fluid collection, hydrocephalus or mass lesion.  The skull is unremarkable.  IMPRESSION: No acute findings by CT of the brain.  Findings were communicated directly to Dr. Fonnie Jarvis.  Original Report Authenticated By: Reola Calkins, M.D.   Mr Maxine Glenn Head Wo Contrast  07/25/2011  *RADIOLOGY REPORT*  Clinical Data:  High blood pressure.  Diabetes.  Hyperlipidemia. Presenting with transient episode of left-sided weakness and right gaze preference.  MRI HEAD WITHOUT CONTRAST MRA HEAD WITHOUT CONTRAST  Technique: Multiplanar, multiecho pulse sequences of the brain and surrounding structures were obtained according to standard protocol without intravenous contrast.  Angiographic images of the head were obtained using MRA technique without contrast.  Comparison:  07/24/2011 CT.  No comparison MR.  MRI HEAD  Findings:  No acute infarct.  No intracranial hemorrhage.  Mild small vessel disease type changes.  No intracranial mass lesion detected on this unenhanced exam.  No hydrocephalus.  Opacification mastoid air cells and middle ear cavities more notable on the right. No obstructing lesions seen in the region of posterior-superior nasopharynx.  Minimal paranasal sinus mucosal thickening.  Mild narrowing upper cervical canal.  Mild exophthalmos.  Major intracranial vascular structures are patent.  IMPRESSION: No acute infarct.  Mild small vessel disease type changes.  Opacification mastoid air cells and middle ear cavities more notable on the right.  MRA HEAD  Findings: Mild to moderate narrowing involving portions of the right internal carotid artery cavernous segment and mild narrowing and irregularity of the portions of the left internal carotid artery cavernous segment.  Small bulge M1 segment left middle cerebral artery.  This appears to be origin of a vessel. Tiny aneurysm not entirely excluded. This is at the limitation of the present exam.  Middle cerebral artery branch vessel irregularity.  Left vertebral artery is dominant.  Mild irregularity right vertebral artery.  Mild irregularity of the basilar artery without high-grade stenosis.  Nonvisualization PICAs.  Fusiform dilated the P1 segment of the  left posterior cerebral artery.  Mild narrowing P1 segment right posterior cerebral artery. Moderate to marked stenosis P1-2 segment right posterior cerebral artery.  Posterior cerebral artery distal branch vessel irregularity greater on the right.  IMPRESSION: Intracranial atherosclerotic type changes as detailed above.  Minimal bulge M1 segment left middle cerebral artery may represent origin of a vessel.  Tiny aneurysm not entirely excluded however this is at the limitation of the present exam.  Original Report Authenticated By: Fuller Canada, M.D.   Mr Brain Wo  Contrast  07/27/2011  *RADIOLOGY REPORT*  Clinical Data: 61 year old female with slurred speech, confusion. Cardiac catheterization yesterday.  Altered mental status.  MRI HEAD WITHOUT CONTRAST  Technique:  Multiplanar, multiecho pulse sequences of the brain and surrounding structures were obtained according to standard protocol without intravenous contrast.  Comparison: 07/25/2011.  Findings: The examination had to be discontinued prior to completion due to patient condition.  . Confluent restricted diffusion extending from the central and medial thalamus into the ventral mid brain (series 5 images 13 - 16).  Associated T2 hyperintensity.  No mass effect or evidence of hemorrhage.  Diffusion elsewhere is within normal limits.  No ventriculomegaly. No midline shift, mass effect, or evidence of mass lesion.  Major intracranial vascular flow voids are stable.  Gray-white matter signal outside of the area of acute ischemia is stable.  Continued right greater than left mastoid effusion.  Fluid layering in the pharynx. Visualized orbit soft tissues are within normal limits.  Negative visualized pituitary, cervicomedullary junction, and cervical spine.  Normal bone marrow signal.  IMPRESSION: 1.  Acute left PCA small vessel infarct involving the thalamus and ventral mid brain.  No mass effect or hemorrhage. 2.  Otherwise stable since 07/25/2011.  Original Report Authenticated By: Harley Hallmark, M.D.   Mr Brain Wo Contrast  07/25/2011  *RADIOLOGY REPORT*  Clinical Data:  High blood pressure.  Diabetes.  Hyperlipidemia. Presenting with transient episode of left-sided weakness and right gaze preference.  MRI HEAD WITHOUT CONTRAST MRA HEAD WITHOUT CONTRAST  Technique: Multiplanar, multiecho pulse sequences of the brain and surrounding structures were obtained according to standard protocol without intravenous contrast.  Angiographic images of the head were obtained using MRA technique without contrast.  Comparison:  07/24/2011 CT.  No comparison MR.  MRI HEAD  Findings:  No acute infarct.  No intracranial hemorrhage.  Mild small vessel disease type changes.  No intracranial mass lesion detected on this unenhanced exam.  No hydrocephalus.  Opacification mastoid air cells and middle ear cavities more notable on the right. No obstructing lesions seen in the region of posterior-superior nasopharynx.  Minimal paranasal sinus mucosal thickening.  Mild narrowing upper cervical canal.  Mild exophthalmos.  Major intracranial vascular structures are patent.  IMPRESSION: No acute infarct.  Mild small vessel disease type changes.  Opacification mastoid air cells and middle ear cavities more notable on the right.  MRA HEAD  Findings: Mild to moderate narrowing involving portions of the right internal carotid artery cavernous segment and mild narrowing and irregularity of the portions of the left internal carotid artery cavernous segment.  Small bulge M1 segment left middle cerebral artery.  This appears to be origin of a vessel. Tiny aneurysm not entirely excluded. This is at the limitation of the present exam.  Middle cerebral artery branch vessel irregularity.  Left vertebral artery is dominant.  Mild irregularity right vertebral artery.  Mild irregularity of the basilar artery without high-grade stenosis.  Nonvisualization PICAs.  Fusiform dilated the  P1 segment of the left posterior cerebral artery.  Mild narrowing P1 segment right posterior cerebral artery. Moderate to marked stenosis P1-2 segment right posterior cerebral artery.  Posterior cerebral artery distal branch vessel irregularity greater on the right.  IMPRESSION: Intracranial atherosclerotic type changes as detailed above.  Minimal bulge M1 segment left middle cerebral artery may represent origin of a vessel.  Tiny aneurysm not entirely excluded however this is at the limitation of the present exam.  Original Report Authenticated By: Fuller Canada, M.D.   Dg Chest Port 1  View  07/25/2011  *RADIOLOGY REPORT*  Clinical Data: Congestive heart failure, improving shortness of breath  PORTABLE CHEST - 1 VIEW  Comparison: Portable chest x-ray of 07/24/2011  Findings: There may be slight improvement in the degree of moderate pulmonary vascular congestion.  Cardiomegaly, bilateral effusions, and mild congestion remain.  Median sternotomy sutures are noted.  IMPRESSION: Slight improvement in congestive heart failure pattern.  Bilateral pleural effusions remain.  Original Report Authenticated By: Juline Patch, M.D.   Dg Chest Port 1 View  07/24/2011  *RADIOLOGY REPORT*  Clinical Data: Shortness of breath.  PORTABLE CHEST - 1 VIEW  Comparison: 06/29/2011  Findings: Worsening interstitial prominence throughout the lungs, likely interstitial edema.  Mild cardiomegaly.  Small to moderate bilateral pleural effusions, right greater than left, slightly increased since prior study.  Bibasilar atelectasis.  Prior CABG.  IMPRESSION: Worsening interstitial prominence, likely interstitial edema/CHF. Enlarging bilateral effusions.  Original Report Authenticated By: Cyndie Chime, M.D.   Dg Chest Port 1v Same Day  07/30/2011  *RADIOLOGY REPORT*  Clinical Data: CHF, pleural effusions  PORTABLE CHEST - 1 VIEW SAME DAY  Comparison: Portable exam 1154 hours compared to 07/26/2011  Findings: Upper normal heart size post CABG. Mediastinal contours normal. Improved interstitial edema. Bibasilar effusions, probably not significantly changed. Upper lungs clear. No pneumothorax or acute osseous findings.  IMPRESSION: Post CABG. Improved interstitial edema with persistent small bibasilar effusions.  Original Report Authenticated By: Lollie Marrow, M.D.    PHYSICAL EXAM    Patient is oriented to person time and place. Affect is normal. Her speech has returned to normal. There is no jugulovenous distention. Lungs are clear. Respiratory effort is nonlabored. Cardiac exam reveals S1 and S2. There no clicks or  significant murmurs. The abdomen is soft. There's no significant peripheral edema.   TELEMETRY: I have reviewed telemetry. She has sinus rhythm. She did have 5 beats of ventricular tachycardia.   ASSESSMENT AND PLAN:   *Acute on chronic systolic heart failure   Overall I&O was mildly negative. Renal function is stable. I will continue to keep her on the current dose of diuretic.   DM type 2 with diabetic peripheral neuropathy    Treatment of her diabetes is not optimal. I will ask for additional help.    NSTEMI (non-ST elevated myocardial infarction)   Medical therapy is underway.   Hyperkalemia    Potassium is normal.   Cardiomyopathy Patient is on appropriate medications.   Hematuria/ UTI    I asked Dr. Ninetta Lights to see the patient yesterday to help me with the approach to her infectious disease care. His help was extremely helpful. Her Biaxin has been stopped. She is now on Levaquin for her urinary tract. This will be continued a full course.   Hx of CABG   Her osteoporosis CABG is being watched by the heart surgeons.  *CVA (cerebral infarction)     It appears that the patient was stable.  However, she had a new event yesterday. She had significant dysarthria. She was seen by neurology. CT scan revealed no acute change. The plan is for her to be seen again by neurology. I'm very uncomfortable managing this overall situation. I wonder if we can consider again whether inpatient rehabilitation would be helpful so that we can monitor her further. Also I need help from neurology as to what other workup should or should not be done at this time.   Ventricular tachycardia    Patient had 5 beats of ventricular tachycardia. Potassium is normal. Magnesium will be checked.  Rehabilitation   As of yesterday morning the plan was for outpatient OT and PT. I wonder if there should be reconsideration for inpatient after her event yesterday. I need more input from neurology.   Willa Rough  08/02/2011 12:02 PM

## 2011-08-02 NOTE — Progress Notes (Addendum)
Inpatient Diabetes Program Recommendations  AACE/ADA: New Consensus Statement on Inpatient Glycemic Control (2009)  Target Ranges:  Prepandial:   less than 140 mg/dL      Peak postprandial:   less than 180 mg/dL (1-2 hours)      Critically ill patients:  140 - 180 mg/dL   Results for [name], [name] (MRN [id number]) as of 08/02/2011 12:19  Ref. Range 08/01/2011 21:19 08/02/2011 06:04 08/02/2011 11:19  Glucose-Capillary Latest Range: 70-99 mg/dL 865 (H) 784 (H) 696 (H)   CBGs quickly went up to >400 yesterday but patient only had 1/4 of home dose Lantus on board.   CBGs in 200 range thus far today.  Needs increase in basal dose.    Inpatient Diabetes Program Recommendations Insulin - Basal: Increase Lantus to 25 BID  Insulin - Meal Coverage: .  Thank you  Piedad Climes Ms [facility] Inpatient Diabetes Coordinator [phone]

## 2011-08-02 NOTE — Progress Notes (Signed)
Stroke Team Progress Note  HISTORY [name] is an 61 y.o. 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 Was pulled out and felt to have left sided weakness and right gaze preference. Oxygen saturation was in the 60's at that time. Patient was diaphoretic as well and had some chest pain. Patient was brought in as a code stroke. By the time of presentation to the ED the patient was moving all extremities but not following commands. Eye deviation had resolved. With improved oxygenation was following commands as well. Code stroke was canceled. Patient was not a TPA candidate secondary to resolution of her symptoms. She was admitted for further evaluation and treatment.  In hospital, she was ruled in for a NSTEMI. Cardiac cath performed 5/30 shows 3/4 grafts open. Intervention was not appropriate, medical therapy recommended.   Neuro was consulted during admission for ongoing neuro symptoms of double vision. 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 Patient was improving on ASA. Stroke Team signed off 07/31/2011. On 08/01/2011 patient had dysarthria in the setting of mental status change. CT head unchanged.  SUBJECTIVE Patient reports total resolution of symptoms. Feels much better than yesterday. Up walking in the halls.  OBJECTIVE Most recent Vital Signs: Filed Vitals:   08/01/11 2154 08/02/11 0453 08/02/11 1054 08/02/11 1445  BP:  104/53 110/60 106/51  Pulse: 78 71 70 70  Temp:  97.8 F (36.6 C)  98.2 F (36.8 C)  TempSrc:  Oral  Oral  Resp:  18  20  Height:      Weight:  70.988 kg (156 lb 8 oz)    SpO2:  95%  93%   CBG (last 3)   Basename 08/02/11 1613 08/02/11 1119 08/02/11 0604  GLUCAP 288* 268* 256*   Intake/Output from previous day: 06/05 0701 - 06/06 0700 In: 1090 [P.O.:840; IV Piggyback:250] Out: 1650  [Urine:1650]  IV Fluid Intake:     MEDICATIONS    . aspirin  325 mg Oral Daily  . carvedilol  6.25 mg Oral BID  . furosemide  40 mg Oral Daily  . gabapentin  300 mg Oral TID  . hydrALAZINE  12.5 mg Oral Q8H  . insulin aspart  0-9 Units Subcutaneous TID WC  . insulin glargine  25 Units Subcutaneous BID  . isosorbide mononitrate  30 mg Oral Daily  . levofloxacin  500 mg Oral Daily  . multivitamin with minerals  1 tablet Oral Daily  . PARoxetine  20 mg Oral q morning - 10a  . pravastatin  40 mg Oral Daily  . sodium chloride  3 mL Intravenous Q12H  . DISCONTD: insulin glargine  17 Units Subcutaneous BID   PRN:  sodium chloride, acetaminophen, ALPRAZolam, ondansetron (ZOFRAN) IV, sodium chloride, traMADol  Diet:  Carb Control thin liquids Activity:  OOB  DVT Prophylaxis:  SCDs   CLINICALLY SIGNIFICANT STUDIES Basic Metabolic Panel:  Lab 08/02/11 4098 08/01/11 1520  NA 132* 133*  K 4.4 4.2  CL 93* 95*  CO2 25 29  GLUCOSE 263* 413*  BUN 31* 37*  CREATININE 0.90 1.07  CALCIUM 9.3 9.0  MG -- --  PHOS -- --   CBC:   Lab 08/02/11 0542 08/01/11 0500  WBC 4.7 4.2  NEUTROABS -- --  HGB 9.5* 9.5*  HCT 28.5* 29.0*  MCV 82.4 81.7  PLT 164 157   BNP:  Lab 08/02/11  0542 08/01/11 0600 07/31/11 0610  PROBNP 1202.0* 1076.0* 1141.0*   Lipid Panel    Component Value Date/Time   CHOL 134 07/29/2011 0618   TRIG 143 07/29/2011 0618   HDL 36* 07/29/2011 0618   CHOLHDL 3.7 07/29/2011 0618   VLDL 29 07/29/2011 0618   LDLCALC 69 07/29/2011 0618   HgbA1C  Lab Results  Component Value Date   HGBA1C 7.1* 07/29/2011   CT of the brain  08/01/2011 MR detected left thalamic/midbrain infarct is now visualized by CT. No CT evidence of new large acute thrombotic infarct or intracranial hemorrhage.  Opacification right mastoid air cells. 07/26/2011   No acute findings by head CT.    07/24/2011  No acute findings by CT of the brain.  MRI of the brain   07/27/2011  1.  Acute left PCA small vessel  infarct involving the thalamus and ventral mid brain.  No mass effect or hemorrhage. 2.  Otherwise stable since 07/25/2011.    07/25/2011  No acute infarct.  Mild small vessel disease type changes.  Opacification mastoid air cells and middle ear cavities more notable on the right.  MRA of the brain  07/25/2011   Intracranial atherosclerotic type changes as detailed above.  Minimal bulge M1 segment left middle cerebral artery may represent origin of a vessel.  Tiny aneurysm not entirely excluded however this is at the limitation of the present exam.    2D Echocardiogram  EF 40% with no source of embolus.   Carotid Doppler  Right: 40-59% internal carotid artery stenosis. Left: 60-79% internal carotid artery stenosis. Bilateral: Vertebral artery flow is antegrade.   CXR   07/26/2011 1.  Stable appearance of bilateral pleural effusions and airspace disease. 2.  The right pleural effusion may be loculated. 3.  Stable pulmonary vascular congestion.   07/25/2011 Slight improvement in congestive heart failure pattern.  Bilateral pleural effusions remain.   07/24/2011 Worsening interstitial prominence, likely interstitial edema/CHF. Enlarging bilateral effusions.   EKG  sinus tachycardia, RBBB, LAFB, consider LVH.   Therapy Recommendations PT -home health, OT -outpatient  Physical Exam   pleasant middle aged Caucasian lady not in distress.Awake alert. Afebrile. Head is nontraumatic. Neck is supple without bruit. Hearing is normal. Cardiac exam no murmur or gallop. Lungs are clear to auscultation. Distal pulses are well felt.  Neurological exam ; Awake  Alert oriented x 3. Normal speech and language.eye movements full except mild vertical skew and restriction of right eye abduction without nystagmus, up-gaze paralysis. Face symmetric. Tongue midline. Normal strength, tone, reflexes and coordination. Normal sensation. Gait deferred.   ASSESSMENT [name] is a 61 y.o. female with an acute left PCA  small vessel infarct involving the thalamus and ventral mid brain. secondary to embolic phenomenon  Or hypotension from MI which occurred prior to arrival.  On aspirin 325 mg orally every day prior to admission. Now on aspirin 325 mg orally every day for secondary stroke prevention. Neuro worsening that occurred 08/01/2011 now stable, likely not an acute neuro event. Double vision resolved.  -NSTEMI -diabetes, HgbA1c 7.1 -Hyperlipidemia, LDL 69, on statin  Hospital day # 9  TREATMENT/PLAN -Continue aspirin 325 mg orally every day for secondary stroke prevention. -outpatient PT and OT at discharge - D/w patient -no further stroke workup - Stroke team will sign off. Call for questions. Follow up Dr. Pearlean Brownie in 2 mos.  Joaquin Music, ANP-BC, GNP-BC [facility] Pager: [phone] 08/02/2011 5:06 PM  Scribe for Dr. Janalyn Shy  Pearlean Brownie, Stroke Chief Executive Officer. He has personally reviewed chart, pertinent data, examined the patient and developed the plan of care. Pager:  [phone]

## 2011-08-02 NOTE — Progress Notes (Signed)
Physical Therapy Treatment Patient Details Name: [name] MRN: [id number] DOB: [birth year] Today's Date: 08/02/2011 Time: 1153-1208 PT Time Calculation (min): 15 min  PT Assessment / Plan / Recommendation Comments on Treatment Session  Pt presents with independent to supervision level with all mobility should be safe for return to home when cleared by MD.      Follow Up Recommendations  Home health PT    Barriers to Discharge        Equipment Recommendations  None recommended by PT    Recommendations for Other Services    Frequency Min 3X/week   Plan      Precautions / Restrictions Precautions Precautions: Fall Restrictions Weight Bearing Restrictions: No   Pertinent Vitals/Pain Pt denied pain     Mobility  Bed Mobility Bed Mobility: Supine to Sit;Sit to Supine Supine to Sit: 7: Independent Sitting - Scoot to Edge of Bed: 7: Independent Sit to Supine: 7: Independent Transfers Transfers: Sit to Stand;Stand to Sit Sit to Stand: 7: Independent Stand to Sit: 7: Independent Ambulation/Gait Ambulation/Gait Assistance: 5: Supervision Ambulation Distance (Feet): 200 Feet Assistive device: 1 person hand held assist;None Ambulation/Gait Assistance Details: supervision for safety due to visual deficits.   Gait Pattern: Step-through pattern;Decreased step length - right;Decreased step length - left;Decreased stride length Stairs: Yes Stairs Assistance: 5: Supervision Stairs Assistance Details (indicate cue type and reason): supervision for safety  Stair Management Technique: One rail Right Number of Stairs: 3  Wheelchair Mobility Wheelchair Mobility: No    Exercises     PT Diagnosis:    PT Problem List:   PT Treatment Interventions:     PT Goals Acute Rehab PT Goals PT Goal Formulation: With patient Time For Goal Achievement: 07/31/11 Potential to Achieve Goals: Good Pt will go Supine/Side to Sit: with modified independence PT Goal: Supine/Side to Sit -  Progress: Met Pt will go Sit to Stand: with supervision PT Goal: Sit to Stand - Progress: Met Pt will Transfer Bed to Chair/Chair to Bed: with supervision PT Transfer Goal: Bed to Chair/Chair to Bed - Progress: Met Pt will Ambulate: >150 feet;with supervision;with least restrictive assistive device PT Goal: Ambulate - Progress: Met Additional Goals Additional Goal #1: DGI >=20 PT Goal: Additional Goal #1 - Progress: Met  Visit Information  Last PT Received On: 08/02/11    Subjective Data  Subjective: I am just sick of this.  Patient Stated Goal: Return to home independent.    Cognition  Overall Cognitive Status: Appears within functional limits for tasks assessed/performed Arousal/Alertness: Awake/alert Orientation Level: Appears intact for tasks assessed Behavior During Session: [facility] for tasks performed    Balance  Balance Balance Assessed: Yes Dynamic Gait Index Level Surface: Normal Change in Gait Speed: Normal Gait with Horizontal Head Turns: Mild Impairment Gait with Vertical Head Turns: Normal Gait and Pivot Turn: Mild Impairment Step Over Obstacle: Mild Impairment Step Around Obstacles: Normal Steps: Normal Total Score: 21   End of Session PT - End of Session Equipment Utilized During Treatment: Gait belt Activity Tolerance: Patient tolerated treatment well Patient left: in chair;with call bell/phone within reach Nurse Communication: Mobility status    Lejon Afzal 08/02/2011, 5:18 PM Miski Feldpausch L. Sharne Linders DPT [phone]

## 2011-08-03 ENCOUNTER — Encounter (HOSPITAL_BASED_OUTPATIENT_CLINIC_OR_DEPARTMENT_OTHER): Payer: 59

## 2011-08-03 DIAGNOSIS — N390 Urinary tract infection, site not specified: Secondary | ICD-10-CM

## 2011-08-03 DIAGNOSIS — I255 Ischemic cardiomyopathy: Secondary | ICD-10-CM

## 2011-08-03 DIAGNOSIS — I2510 Atherosclerotic heart disease of native coronary artery without angina pectoris: Secondary | ICD-10-CM

## 2011-08-03 LAB — GLUCOSE, CAPILLARY: Glucose-Capillary: 297 mg/dL — ABNORMAL HIGH (ref 70–99)

## 2011-08-03 LAB — CBC
HCT: 29.2 % — ABNORMAL LOW (ref 36.0–46.0)
MCHC: 33.9 g/dL (ref 30.0–36.0)
RDW: 14.8 % (ref 11.5–15.5)

## 2011-08-03 MED ORDER — LEVOFLOXACIN 500 MG PO TABS: 500.0000 mg | ORAL_TABLET | Freq: Every day | ORAL | Status: AC

## 2011-08-03 MED ORDER — ISOSORBIDE MONONITRATE ER 30 MG PO TB24: 30.0000 mg | ORAL_TABLET | Freq: Every day | ORAL | Status: DC

## 2011-08-03 MED ORDER — FUROSEMIDE 40 MG PO TABS: 40.0000 mg | ORAL_TABLET | Freq: Every day | ORAL | Status: DC

## 2011-08-03 MED ORDER — HYDRALAZINE HCL 25 MG PO TABS: 12.5000 mg | ORAL_TABLET | Freq: Three times a day (TID) | ORAL | Status: DC

## 2011-08-03 MED ORDER — CARVEDILOL 6.25 MG PO TABS: 6.2500 mg | ORAL_TABLET | Freq: Two times a day (BID) | ORAL | Status: DC

## 2011-08-03 NOTE — Discharge Instructions (Signed)
PLEASE REMEMBER TO BRING ALL OF YOUR MEDICATIONS TO EACH OF YOUR FOLLOW-UP OFFICE VISITS.  PLEASE TAKE ALL NEW MEDICATIONS/MEDICATION CHANGES AS PRESCRIBED  PLEASE ATTEND ALL FOLLOW-UP APPOINTMENTS.   NO HEAVY LIFTING X 1 WEEK. NO SEXUAL ACTIVITY X 1 WEEK. NO DRIVING UNTIL AFTER FOLLOW-UP WITH DR. [name]. YOU MAY SHOWER AND BATHE. PLEASE CALL [phone] FOR DRAINAGE, BLEEDING, PUS, SWELLING OR INCREASED TENDERNESS TO YOUR CARDIAC CATHETERIZATION SITE.   Heart Failure  Heart failure (HF) means your heart has trouble pumping blood. The blood is not circulated very well in your body because your heart is weak. HF may cause blood to back up into your lungs. This is commonly called "fluid in the lungs." HF may also cause your ankles and legs to puff up (swell). It is important to take good care of yourself when you have HF.  HOME CARE  Medicine  Take your medicine as told by your doctor.  Do not stop taking your medicine unless told to by your doctor.  Be sure to get your medicine refilled before it runs out.  Do not skip any doses of medicine.  Tell your doctor if you cannot afford your medicine.  Keep a list of all the medicine you take. This should include the name, how much you take, and when you take it.  Ask your doctor if you have any questions about your medicine. Do not take over-the-counter medicine unless your doctor says it is okay.  What you eat  Do not drink alcohol unless your doctor says it is okay.  Avoid food that is high in fat. Avoid foods fried in oil or made with fat.  Eat a healthy diet. A dietitian can help you with healthy food choices.  Limit how much salt you eat. Do not eat more than 1500 milligrams (mg) of salt (sodium) a day.  Do not add salt to your food.  Do not eat food made with a lot of salt. Here are some examples:  Canned vegetables.  Canned soups.  Canned drinks.  Hot dogs.  Fast food.  Pizza.  Chips.  Check your weight  Weigh yourself every  morning. You should do this after you pee (urinate) and before you eat breakfast.  Wear the same amount of clothes each time you weigh yourself.  Write down your weight every day. Tell your doctor if you gain 3 lb/1.4 kg or more in 1 day or 5 lb/2.3 kg in a week.  Blood pressure monitoring  Buy a home blood pressure cuff.  Check your blood pressure as told by your doctor. Write down your blood pressure numbers on a sheet of paper.  Bring your blood pressure numbers to your doctor visits.  Smoking  Smoking is bad for your heart.  Ask your doctor how to stop smoking.  Exercise  Talk to your doctor about exercise.  Ask how much exercise is right for you.  Exercise as much as you can. Stop if you feel tired, have problems breathing, or have chest pain.  Keep all your doctor appointments.  GET HELP RIGHT AWAY IF:  You have trouble breathing.  You have a cough that does not go away.  You cannot sleep because you have trouble breathing.  You gain 3 lb/1.4 kg or more in 1 day or 5 lb/2.3 kg in a week.  You have puffy ankles or legs.  You have an enlarged (bloated) belly (abdomen).  You pass out (faint).  You have really bad chest pain  or pressure. This includes pain or pressure in your:  Arms.  Jaw.  Neck.  Back.  If you have any of the above problems, call your local emergency services (911 in [place] Do not drive yourself to the hospital.  MAKE SURE YOU:  Understand these instructions.  Will watch your condition.  Will get help right away if you are not doing well or get worse.  Document Released: 11/22/2007 Document Revised: 02/01/2011 Document Reviewed: 06/15/2008  ExitCare Patient Information 2012 [place], [place]   STROKE/TIA DISCHARGE INSTRUCTIONS SMOKING Cigarette smoking nearly doubles your risk of having a stroke & is the single most alterable risk factor  If you smoke or have smoked in the last 12 months, you are advised to quit smoking for your health.  Most of the excess  cardiovascular risk related to smoking disappears within a year of stopping.  Ask you doctor about anti-smoking medications  [place] Quit Line: 1-800-QUIT NOW  Free Smoking Cessation Classes [phone]  CHOLESTEROL Know your levels; limit fat & cholesterol in your diet  Lipid Panel     Component Value Date/Time   CHOL 134 07/29/2011 0618   TRIG 143 07/29/2011 0618   HDL 36* 07/29/2011 0618   CHOLHDL 3.7 07/29/2011 0618   VLDL 29 07/29/2011 0618   LDLCALC 69 07/29/2011 0618      Many patients benefit from treatment even if their cholesterol is at goal.  Goal: Total Cholesterol (CHOL) less than 160  Goal:  Triglycerides (TRIG) less than 150  Goal:  HDL greater than 40  Goal:  LDL (LDLCALC) less than 100   BLOOD PRESSURE American Stroke Association blood pressure target is less that 120/80 mm/Hg  Your discharge blood pressure is:  BP: 106/51 mmHg  Monitor your blood pressure  Limit your salt and alcohol intake  Many individuals will require more than one medication for high blood pressure  DIABETES (A1c is a blood sugar average for last 3 months) Goal HGBA1c is under 7% (HBGA1c is blood sugar average for last 3 months)  Diabetes: {STROKE DC DIABETES:22357}    Lab Results  Component Value Date   HGBA1C 7.1* 07/29/2011     Your HGBA1c can be lowered with medications, healthy diet, and exercise.  Check your blood sugar as directed by your physician  Call your physician if you experience unexplained or low blood sugars.  PHYSICAL ACTIVITY/REHABILITATION Goal is 30 minutes at least 4 days per week    {STROKE DC ACTIVITY/REHAB:22359}  Activity decreases your risk of heart attack and stroke and makes your heart stronger.  It helps control your weight and blood pressure; helps you relax and can improve your mood.  Participate in a regular exercise program.  Talk with your doctor about the best form of exercise for you (dancing, walking, swimming, cycling).  DIET/WEIGHT Goal is to  maintain a healthy weight  Your discharge diet is: Carb Control *** liquids Your height is:  Height: 5\' 5"  (165.1 cm) Your current weight is: Weight: 70.988 kg (156 lb 8 oz) (scale a ) Your Body Mass Index (BMI) is:  BMI (Calculated): 26.1   Following the type of diet specifically designed for you will help prevent another stroke.  Your goal weight range is:  ***  Your goal Body Mass Index (BMI) is 19-24.  Healthy food habits can help reduce 3 risk factors for stroke:  High cholesterol, hypertension, and excess weight.  RESOURCES Stroke/Support Group:  Call [phone]  they meet the 3rd Sunday of the  month on the Rehab Unit at [facility], [place] ( no meetings June, July & Aug).  STROKE EDUCATION PROVIDED/REVIEWED AND GIVEN TO PATIENT Stroke warning signs and symptoms How to activate emergency medical system (call 911). Medications prescribed at discharge. Need for follow-up after discharge. Personal risk factors for stroke. Pneumonia vaccine given:   {STROKE DC YES/NO/DATE:22363} Flu vaccine given:   {STROKE DC YES/NO/DATE:22363} My questions have been answered, the writing is legible, and I understand these instructions.  I will adhere to these goals & educational materials that have been provided to me after my discharge from the hospital.

## 2011-08-03 NOTE — Progress Notes (Signed)
Went over all d/c orders and follow up care and appoints pt restated she understood about meds and fu husband at bedside

## 2011-08-03 NOTE — Discharge Summary (Signed)
Discharge Summary   Patient ID: [name],  MRN: [id number], DOB/AGE: 30-Jul-1950 61 y.o.  Admit date: 07/24/2011 Discharge date: 08/03/2011  Discharge Diagnoses Principal Problem:  *Acute on chronic systolic heart failure  - In the setting of initial hyperbaric chamber therapy and NSTEMI  - CXR on admission with worsening interstitial edema and enlarging bilateral pleural effusions  - pCXR on 07/30/11 with improved interstitial edema with persistent small bibasilar effusion   - Admission weight 162 lbs; discharge weight 157 lbs  - Total admission I/O: -7053 cc  - pBNP trend   07/24/2011 09:53 07/29/2011 06:18 07/30/2011 05:30 07/31/2011 06:10 08/01/2011 06:00 08/02/2011 05:42 08/03/2011 07:20  Pro B Natriuretic peptide (BNP) 3723.0 (H) 1204.0 (H) 1134.0 (H) 1141.0 (H) 1076.0 (H) 1202.0 (H) 1164.0 (H)     - 2D echo 07/26/11: inferior septal/apical hypokiesis, mild LV dilatation, mild LVH, LVEF 40%, mild MR, mild LA dilatation, PA peak pressure 31 mmHg (poor image quality)  - EF by cath LV-gram: 20-25%  - Improved and stable respiratory/volume status on Lasix 40mg  PO daily for past 8 days  - No ACEi/spironolactone secondary to hyperkalemia  - Coreg/Lasix/Hydralazine/Imdur continued on discharge  - Supplemental heart failure information provided (weight, symptom/sign monitoring, salt restriction)  - Follow-up with Dr. Diona Browner in 2-3 weeks with BMET as below  Active Problems:   CAD/NSTEMI (non-ST elevated myocardial infarction)  - History of severe 3 vessel CAD s/p CABG x 4 in 11/12  - Cardiac biomarker trend    07/24/2011 09:58 07/24/2011 17:26 07/25/2011 01:05 07/25/2011 08:43  CK, MB  8.3 (HH) 8.2 (HH) 7.4 (HH)  CK Total  137 123 102  Troponin I  2.25 (HH) 1.53 (HH) 1.27 (HH)  Troponin i, poc 1.02 (HH)        - Cardiac cath 07/26/11: severe 3v CAD s/p CABG x 4 with 3/4 patent grafts. SVG-OM small and diffusely disease. Small target, unamenable to PCI. LVEF 20-25%. Rec: continue medical  management.   - Poorly healing sternal wound evaluated by Dr. Donata Clay  - Healing well near discharge; follow-up in the office as scheduled below   Essential hypertension, benign  - Fairly well-controlled this admission   DM type 2 with diabetic peripheral neuropathy  - Fluctuations in CBGs this admission  - Hgb A1C 7.1  - Diabetes coordinator consulted  - Episode of hypoglycemia to CBG of 43  - Episode of hyperglycemia to CBG of 300-400s   - Adjustments to SSI and Lantus made  - Better controlled near discharge; to resume outpatient insulin regimen as below   Acute respiratory failure  - On admission with respiratory acidosis  - Admission ABG   07/24/2011 10:42  Sample type ARTERIAL  pH, Arterial 7.082 (LL)  pCO2 arterial 89.2 (HH)  pO2, Arterial 123.0 (H)  Bicarbonate 26.6 (H)  TCO2 29  Acid-base deficit 5.0 (H)  O2 Saturation 96.0  Collection site RADIAL, ALLEN'S TEST ACCEPTABLE    - Improved on BiPAP (weaned succesfully), Lasix IV   Altered mental status  - Several instances: secondary to hypoxia, acute ischemic CVA, hyperglycemia    Hyperlipidemia  - Good control  - Lipid panel on 07/29/11:    07/29/2011 06:18  Cholesterol 134  Triglycerides 143  HDL 36 (L)  LDL (calc) 69  VLDL 29  Total CHOL/HDL Ratio 3.7    - Continued on Zocor 20mg  on discharge   Hyperkalemia   - K+ ranged from 5.9 to 6.4 on admission with successful down-trend to within normal  range  - This was maintained for the duration of the admission  - ACEi and spironolactone not added  - KCl supplementation not added  UTI  - U/A Hematuria noted  - Urine culture: + Klebsiella   CVA (cerebral infarction)  - AMS, left-sided weakness with right gaze preference immediately after hyperbaric therapy  - CVA initially ruled out on admission, no tPA given  - Visualized by MRI/MRA on 07/25/11  - Allergic to Plavix  - Continued on full-dose ASA  - Statin continued, LDL 69 as above  - Hgb A1C  7.1  - Eye patch for diplopia  - PT/OT consults--> home health PT/OT services on discharge   - Follow-up Dr. Pearlean Brownie in 2 months   Ventricular tachycardia  - Intermittent episodes of NSVT this admission  - K+ WNL, Mg 2.0 this AM  - Continued on Coreg   Ischemic cardiomyopathy  - See CHF above   Carotid artery disease  - Carotid doppler u/s on 07/25/11: R ICA 4059% stenosis, L ICA 60-79% stenosis; bilateral antegrade flow  Allergies Allergies  Allergen Reactions  . Clopidogrel Bisulfate Hives  . Codeine Nausea And Vomiting  . Morphine Nausea And Vomiting    Diagnostic Studies/Procedures  PORTABLE CHEST - 1 VIEW- 07/24/11  Comparison: 06/29/2011  Findings: Worsening interstitial prominence throughout the lungs,  likely interstitial edema. Mild cardiomegaly. Small to moderate  bilateral pleural effusions, right greater than left, slightly  increased since prior study. Bibasilar atelectasis. Prior CABG.  IMPRESSION:  Worsening interstitial prominence, likely interstitial edema/CHF.  Enlarging bilateral effusions.  CT HEAD WITHOUT CONTRAST- 07/24/11  Technique: Contiguous axial images were obtained from the base of  the skull through the vertex without contrast.  Comparison: None.  Findings: The brain demonstrates no evidence of hemorrhage,  infarction, edema, mass effect, extra-axial fluid collection,  hydrocephalus or mass lesion. The skull is unremarkable.  IMPRESSION:  No acute findings by CT of the brain. Findings were communicated  directly to Dr. Fonnie Jarvis.  PORTABLE CHEST - 1 VIEW- 07/25/11  Comparison: Portable chest x-ray of 07/24/2011  Findings: There may be slight improvement in the degree of moderate  pulmonary vascular congestion. Cardiomegaly, bilateral effusions,  and mild congestion remain. Median sternotomy sutures are noted.  IMPRESSION:  Slight improvement in congestive heart failure pattern. Bilateral  pleural effusions remain.   MRI/MRA HEAD  WITHOUT CONTRAST- 07/25/11  Technique: Multiplanar, multiecho pulse sequences of the brain and  surrounding structures were obtained according to standard protocol  without intravenous contrast. Angiographic images of the head were  obtained using MRA technique without contrast.  Comparison: 07/24/2011 CT. No comparison MR.  MRI HEAD  Findings: No acute infarct.  No intracranial hemorrhage.  Mild small vessel disease type changes.  No intracranial mass lesion detected on this unenhanced exam.  No hydrocephalus.  Opacification mastoid air cells and middle ear cavities more  notable on the right. No obstructing lesions seen in the region of  posterior-superior nasopharynx. Minimal paranasal sinus mucosal  thickening.  Mild narrowing upper cervical canal.  Mild exophthalmos.  Major intracranial vascular structures are patent.  IMPRESSION:  No acute infarct.  Mild small vessel disease type changes.  Opacification mastoid air cells and middle ear cavities more  notable on the right.  MRA HEAD  Findings: Mild to moderate narrowing involving portions of the  right internal carotid artery cavernous segment and mild narrowing  and irregularity of the portions of the left internal carotid  artery cavernous segment.  Small bulge M1 segment  left middle cerebral artery. This appears  to be origin of a vessel. Tiny aneurysm not entirely excluded.  This is at the limitation of the present exam.  Middle cerebral artery branch vessel irregularity.  Left vertebral artery is dominant. Mild irregularity right  vertebral artery.  Mild irregularity of the basilar artery without high-grade  stenosis.  Nonvisualization PICAs.  Fusiform dilated the P1 segment of the left posterior cerebral  artery. Mild narrowing P1 segment right posterior cerebral artery.  Moderate to marked stenosis P1-2 segment right posterior cerebral  artery. Posterior cerebral artery distal branch vessel  irregularity greater  on the right.  IMPRESSION:  Intracranial atherosclerotic type changes as detailed above.  Minimal bulge M1 segment left middle cerebral artery may represent  origin of a vessel. Tiny aneurysm not entirely excluded however  this is at the limitation of the present exam.  DOPPLER CAROTID U/S- 07/25/11  Arterial flow:  +----------+--------+-------+-------------+----------------+ Location V sys V ed Flow analysisComment  +----------+--------+-------+-------------+----------------+ Right CCA 104cm/s 8cm/s ----------------------------- - proximal     +----------+--------+-------+-------------+----------------+ Right CCA 90cm/s 13cm/s ----------------------------- - distal      +----------+--------+-------+-------------+----------------+ Right ECA -211cm/s0cm/s ----------------------------- +----------+--------+-------+-------------+----------------+ Right ICA -128cm/s-40cm/s-------------Mild to  - proximal   moderatediffuse      heterogeneous      plaque  +----------+--------+-------+-------------+----------------+ Right ICA -76cm/s -24cm/s----------------------------- - distal      +----------+--------+-------+-------------+----------------+ Right -70cm/s -18cm/sAntegrade ---------------- vertebral   flow   +----------+--------+-------+-------------+----------------+ Left CCA -82cm/s 12cm/s ----------------------------- proximal      +----------+--------+-------+-------------+----------------+ Left CCA -85cm/s 20cm/s ----------------------------- distal      +----------+--------+-------+-------------+----------------+ Left ECA 358cm/s 0cm/s ----------------------------- +----------+--------+-------+-------------+----------------+ Left ICA --184cm/s-62cm/s-------------Moderate  proximal    calcific plaque   +----------+--------+-------+-------------+----------------+ Left ICA --130cm/s-40cm/s----------------------------- distal      +----------+--------+-------+-------------+----------------+ Left -56cm/s -11cm/sAntegrade ---------------- vertebral   flow   +----------+--------+-------+-------------+----------------+  ------------------------------------------------------------ Summary:  - Findings consistent with 40-59%stenosis involving the right internal carotid artery. - Findings consistent with 60-79% stenosis involving the left internal carotid artery. - Bilateral vertebral artery flow antegrade.  CHEST - 2 VIEW- 07/26/11  Comparison:  Portable upright chest radiograph 07/25/2011.  Findings: Borderline cardiomegaly is stable. Bilateral pleural  effusions are not significantly changed. The right pleural  effusion may be loculated given its lateral and posterior position.  Mild pulmonary vascular congestion is stable. Bibasilar airspace  disease is unchanged, likely representing atelectasis.  IMPRESSION:  1. Stable appearance of bilateral pleural effusions and airspace  disease.  2. The right pleural effusion may be loculated.  3. Stable pulmonary vascular congestion.  CT HEAD WITHOUT CONTRAST- 07/26/11  Technique: Contiguous axial images were obtained from the base of  the skull through the vertex without contrast.  Comparison: MRI of the brain on 07/25/2011.  Findings: No evidence of acute infarction. There is some contrast  in the dural sinuses related to the catheterization procedure. No  evidence of acute intracranial hemorrhage, extra-axial fluid  collections, mass effect, hydrocephalus or mass lesion. The skull  is unremarkable.  IMPRESSION:  No acute findings by head CT.   CARDIAC CATHETERIZATION- 07/26/11  Procedure Performed:  1. Left Heart Catheterization 2. Selective Coronary Angiography 3. Left ventricular angiogram 4. SVG  graft angiography Operator: Verne Carrow, MD  Indication: Pt with known severe ischemic cardiomyopathy, severe triple vessel CAD with multiple prior stenting procedures and s/p 4V CABG in November 2012 who is admitted with acute on chronic systolic CHF. Her troponin was mildly elevated. She has had no chest pain.  Procedure Details:  The risks, benefits, complications, treatment options, and expected  outcomes were discussed with the patient. The patient and/or family concurred with the proposed plan, giving informed consent. The patient was brought to the cath lab after IV hydration was begun and oral premedication was given. The patient was further sedated with Versed and Fentanyl. The right groin was prepped and draped in the usual manner. Using the modified Seldinger access technique, a 5 French sheath was placed in the right femoral artery. Standard diagnostic catheters were used to perform selective coronary angiography. The JR4 catheter was used to engage all three vein grafts. A pigtail catheter was used to perform a left ventricular angiogram.  There were no immediate complications. The patient was taken to the recovery area in stable condition.  Hemodynamic Findings:  Central aortic pressure: 96/47  Left ventricular pressure: 93/5/12  Angiographic Findings:  Left main: Distal 40% stenosis.  Left Anterior Descending Artery: Large caliber vessel that courses to the apex. 99% stenosis in the ostium. The proximal vessel fills slowly antegrade and then has 100% mid stenosis. The mid vessel and the diagonal branch fills from the vein graft. There is a stent in the mid vessel. The distal LAD fills from right to left collaterals via the filling of the distal RCA from the vein graft to the distal RCA.  Circumflex Artery: Small to moderate sized vessel with patent stents proximal and mid vessel. There is competitive filling into the most distal OM branch from the vein graft. The Ramus branch is  moderate sized and is occluded proximally. This branch fills from the patent vein graft.  Right Coronary Artery: Large, dominant artery with patent stents in the proximal and mid vessel. The mid vessel has a 99% stenosis. The distal vessel has diffuse plaque disease. A large RV marginal branch fills from the vein graft and this fills the distal RCA as well.  Graft Anatomy:  SVG to large RV marginal branch is patent. This fills the marginal branch and also competitively fills the distal RCA and PDA  Sequential SVG to LAD and small OM branch: The segment to the LAD is patent. This also fills the diagonal branch. The distal LAD is not filled by this graft. The sequential limb to the OM branch is sewn in as a Y graft in the proximal body of the SVG to the LAD. This limb to the OM branch has diffuse high grade stenosis. This graft supplies a small 1.5 mm OM branch. This is a small area of myocardium.  SVG to Ramus Intermediate is patent. Mild disease in the body of the vein graft.  Left Ventricular Angiogram: LVEF 20-25%.  Impression:  1. 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 The vein graft to the OM is small in caliber and diffusely diseased. The target vessel (OM branch) is very small.  2. Severe LV systolic dysfunction.  Recommendations: Continue medical management. The SVG to the OM arises in a "Y" fashion from the SVG to the LAD. This graft is small and diffusely diseased and supplies a small target vessel. This is not favorable for PCI.  Complications: None. The patient tolerated the procedure well.   MRI HEAD WITHOUT CONTRAST- 07/27/11  Technique: Multiplanar, multiecho pulse sequences of the brain and  surrounding structures were obtained according to standard protocol  without intravenous contrast.  Comparison: 07/25/2011.  Findings: The examination had to be discontinued prior to  completion due to patient condition.  . Confluent restricted diffusion extending  from the central and  medial thalamus into  the ventral mid brain (series 5 images 13 -  16). Associated T2 hyperintensity. No mass effect or evidence of  hemorrhage.  Diffusion elsewhere is within normal limits. No ventriculomegaly.  No midline shift, mass effect, or evidence of mass lesion. Major  intracranial vascular flow voids are stable. Gray-white matter  signal outside of the area of acute ischemia is stable.  Continued right greater than left mastoid effusion. Fluid layering  in the pharynx. Visualized orbit soft tissues are within normal  limits. Negative visualized pituitary, cervicomedullary junction,  and cervical spine. Normal bone marrow signal.  IMPRESSION:  1. Acute left PCA small vessel infarct involving the thalamus and  ventral mid brain. No mass effect or hemorrhage.  2. Otherwise stable since 07/25/2011.   PORTABLE CHEST X-RAY- 07/30/11  Comparison: Portable exam 1154 hours compared to 07/26/2011  Findings:  Upper normal heart size post CABG.  Mediastinal contours normal.  Improved interstitial edema.  Bibasilar effusions, probably not significantly changed.  Upper lungs clear.  No pneumothorax or acute osseous findings.  IMPRESSION:  Post CABG.  Improved interstitial edema with persistent small bibasilar  effusions.    CT HEAD WITHOUT CONTRAST- 08/01/11   Technique: Contiguous axial images were obtained from the base of  the skull through the vertex without contrast.  Comparison: 07/27/2011 MR. 07/26/2011 CT.  Findings: MR detected left thalamic/midbrain infarct is now  visualized by CT. No CT evidence of new large acute thrombotic  infarct or intracranial hemorrhage.  No intracranial mass lesion detected on this unenhanced exam.  Partially empty sella incidentally noted.  Opacification right mastoid air cells without obstructing mass  causing eustachian tube dysfunction detected.  IMPRESSION:  MR detected left thalamic/midbrain infarct is now  visualized by CT.  No CT evidence of new large acute thrombotic infarct or  intracranial hemorrhage.  Opacification right mastoid air cells.  History of Present Illness  [name] is a 61yo female with the above problem list who was admitted to [facility] on 07/24/11 for acute respiratory failure believed to be secondary to acute on chronic systolic CHF.   She was transferred to [facility] ED from hyperbaric oxygen therapy after her first treatment. She was taken out of treatment after becoming unresponsive, diaphoretic and in severe respiratory distress with resultant acute respiratory failure with O2 saturation in the 60s improving to 90-100% on NRB. The patient initially presented with left-sided weakness and right gaze preference. Code stroke was called. CT noncontrast head was nonacute. Neurology suspected cardiac etiology as the patient's mentation improved with O2 supplementation.   She noted feeling progressively worse in terms of dyspnea prior to 3 weeks. She denied chest pain during this time however. She denied dietary indiscretion and medication noncompliance. She denied r edema, fevers or chills; but didn't endorse some orthopnea.  Upon ED evaluation, she was found to be hyperkalemic with potassium of 5.9. She did exhibit a mild leukocytosis at 17.5. Point-of-care troponin was elevated at 1.02 the subsequent reading of 1.31. Urinalysis revealed small amount of bilirubin, trace leukocytes large including. Pro BNP was elevated at 3723. ABG as above revealed findings consistent with respiratory acidosis. She was initially started on BiPAP. She was treated with albuterol/Atrovent nebulizers, steroids, Lasix IV and oxygen with improvement in her respiratory status. BiPAP was subsequently weaned. Chest x-ray revealed pulmonary edema, and no evidence of pneumonia. Given her elevated cardiac markers, elevated proBNP in respiratory status bleed secondary to an acute CHF exacerbation she was  subsequently admitted and started on  heparin. Outpatient medications were continued on admission.  Hospital Course   CARDIAC  CARDIAC ENZYMES RETURNED AS ABOVE. THE PATIENT CHF EXACERBATION WAS SUSPECTED TO BE SECONDARY TO AND STENTING VERSUS HYPERBARIC OXYGEN THERAPY. THE PATIENT CONTINUED TO DIURESIS, AND HER RESPIRATORY STATUS IMPROVED AND THE PLAN WAS MADE TO PURSUE CARDIAC CATHETERIZATION. SHE WAS INFORMED, CONSENTED AND PREPPED FOR THE PROCEDURE WHICH WAS ACCESSED VIA THE RIGHT FEMORAL ARTERY. THIS REVEALED THE ABOVE FINDINGS. CONTINUED MEDICAL MANAGEMENT IS RECOMMENDED. Patient tolerated procedure well without complication. The patient improved from a respiratory standpoint. BNP declined and serial CXR revealed improved aeration. She was evaluated by Dr. Myrtis Ser today who found her to be stable for discharge. She ambulated well. She will follow-up with Dr. Diona Browner as below. She will continue the medications listed below.   NEURO  The patient continued to experience neurological deficits and AMS. Neuro was consulted. Repeat noncontrast CT head scans were ordered with results as above. MRI/MRA head revealed evidence of acute ischemia as noted above. The recommendation was made to continue full-dose ASA for antiplatelet therapy. The patient is allergic to Plavix. She will follow-up with Dr. Pearlean Brownie as below. Home health PT/OT services have been arranged.   ID  U/A on admission was concerning for UTI. The patient was noted to have Klebsiella grown in her urine culture. ID was consulted. Prior Biaxin abx therapy was switched to Levaquin. She will be discharged on this with 4 days worth of medication to complete a 7 day course.   ENDOCRINE  The patient was noted to have fluctuations in her CBGs this admission. Hgb A1C was drawn as above. SSI and Lantus were adjusted. She was further assessed yesterday and today, and found appropriate to resume her outpatient insulin regimen based on her recent  glycemic control.  CT SURGERY  TCTS was consulted for input on a poorly healing sternal wound. This improved with silver alginate dressings. She will follow-up in the office as noted below.   Discharge Vitals:  Blood pressure 110/59, pulse 69, temperature 97.9 F (36.6 C), temperature source Oral, resp. rate 20, height 5\' 5"  (1.651 m), weight 71.215 kg (157 lb), last menstrual period 01/05/1996, SpO2 97.00%.   Weight change: 0.227 kg (8 oz)  Labs: Recent Labs  [facility] 08/03/11 0720 08/02/11 0542   WBC 3.7* 4.7   HGB 9.9* 9.5*   HCT 29.2* 28.5*   MCV 83.4 82.4   PLT 154 164    Lab 08/02/11 0542 08/01/11 1520 07/28/11 0530  NA 132* 133* 136  K 4.4 4.2 4.7  CL 93* 95* 98  CO2 25 29 25   BUN 31* 37* 27*  CREATININE 0.90 1.07 0.74  CALCIUM 9.3 9.0 9.3  PROT -- -- --  BILITOT -- -- --  ALKPHOS -- -- --  ALT -- -- --  AST -- -- --  AMYLASE -- -- --  LIPASE -- -- --  GLUCOSE 263* 413* 143*   Disposition:  Discharge Orders    Future Appointments: Provider: Department: Dept Phone: Center:   08/10/2011 9:40 AM [facility] [phone] None   08/10/2011 3:30 PM Kerin Perna, MD [facility] [place] [phone] [facility]   08/17/2011 1:30 PM Randall An, MD [facility] [phone] None   08/24/2011 9:20 AM Jonelle Sidle, MD [facility] [phone] [facility]   09/17/2011 10:00 AM Romero Belling, MD [facility] [phone] [facility]     Follow-up Information    Follow up with Gates Rigg, MD. Schedule an appointment as soon as possible  for a visit in 2 months.   Contact information:   [address], [address] [facility] [place] [address] [phone]       Follow up with Nona Dell, MD on 08/24/2011. (At 9:20 AM for follow-up after this hospitalization. You will have labwork done that day as well. )    Contact information:   [address] [place] [address] [phone]       Follow  up with [facility] on 08/10/2011. (At 3:30 PM for sternal wound evaluation and management. )    Contact information:   [address] [employer] [address] [place] [address] [phone]         Discharge Medications:  Medication List  As of 08/03/2011 12:48 PM   START taking these medications         furosemide 40 MG tablet   Commonly known as: LASIX   Take 1 tablet (40 mg total) by mouth daily.      hydrALAZINE 25 MG tablet   Commonly known as: APRESOLINE   Take 0.5 tablets (12.5 mg total) by mouth every 8 (eight) hours.      isosorbide mononitrate 30 MG 24 hr tablet   Commonly known as: IMDUR   Take 1 tablet (30 mg total) by mouth daily.      levofloxacin 500 MG tablet   Commonly known as: LEVAQUIN   Take 1 tablet (500 mg total) by mouth daily.         CHANGE how you take these medications         carvedilol 6.25 MG tablet   Commonly known as: COREG   Take 1 tablet (6.25 mg total) by mouth 2 (two) times daily.   What changed: - medication strength - dose         CONTINUE taking these medications         aspirin 325 MG tablet      gabapentin 300 MG capsule   Commonly known as: NEURONTIN      insulin glargine 100 UNIT/ML injection   Commonly known as: LANTUS      insulin lispro 100 UNIT/ML injection   Commonly known as: HUMALOG      multivitamin with minerals Tabs      PARoxetine 20 MG tablet   Commonly known as: PAXIL      simvastatin 20 MG tablet   Commonly known as: ZOCOR   Take 1 tablet (20 mg total) by mouth at bedtime.      traMADol 50 MG tablet   Commonly known as: ULTRAM         STOP taking these medications         clarithromycin 500 MG tablet          Where to get your medications    These are the prescriptions that you need to pick up. We sent them to a specific pharmacy, so you will need to go there to get them.   [id number] - [place], [place] - [address]    [address]  [place] [address]    Phone: [phone]    Hours: 24-hours        carvedilol 6.25 MG tablet   furosemide 40 MG tablet   hydrALAZINE 25 MG tablet   isosorbide mononitrate 30 MG 24 hr tablet   levofloxacin 500 MG tablet           Outstanding Labs/Studies: BMET  in 2-3 weeks  Duration of Discharge Encounter: Greater than 30 minutes including physician time.  Signed, R. Hurman Horn, PA-C 08/03/2011, 12:48 PM Patient seen and examined. I agree with the assessment and plan as detailed above. See also my additional thoughts below.   Please refer also to my complete progress note with all problems listed today  Willa Rough, MD, [facility] 08/03/2011 4:14 PM

## 2011-08-03 NOTE — Progress Notes (Signed)
Inpatient Diabetes Program Recommendations  AACE/ADA: New Consensus Statement on Inpatient Glycemic Control (2009)  Target Ranges:  Prepandial:   less than 140 mg/dL      Peak postprandial:   less than 180 mg/dL (1-2 hours)      Critically ill patients:  140 - 180 mg/dL    Results for EVERLY, RUBALCAVA (MRN [id number]) as of 08/03/2011 07:28  Ref. Range 08/02/2011 06:04 08/02/2011 11:19 08/02/2011 16:13 08/02/2011 22:19 08/03/2011 05:55  Glucose-Capillary Latest Range: 70-99 mg/dL 829 (H) 562 (H) 130 (H) 297 (H) 244 (H)   Patient is to receive 2nd dose of new Lantus order this morning.  Although still not up to home dose, anticipate improvement.  If pt not discharged and CBGs still elevated tonight, would recommend further titration back to home dose.    Thank you  Piedad Climes [facility] Inpatient Diabetes Coordinator [phone]

## 2011-08-03 NOTE — Progress Notes (Signed)
   CARE MANAGEMENT NOTE 08/03/2011  Patient:  [name], [name]   Account Number:  [id number]  Date Initiated:  07/24/2011  Documentation initiated by:  [facility]  Subjective/Objective Assessment:   61 yo with hx of chronic S-CHF presented to the ED as a Code Stroke after being pulled from her first hyperbaric O2 treatment unresponsive and diaphoretic with a fixed gaze. Code stroke was canceled->A on C HF.     Action/Plan:   ICU admission as IP. Bipap/steroids/lasix. Has used [facility] in the past-   Anticipated DC Date:  07/31/2011   Anticipated DC Plan:  HOME W HOME HEALTH SERVICES      DC Planning Services  CM consult      [facility] Choice  HOME HEALTH   Choice offered to / List presented to:  C-3 Spouse        HH arranged  HH-1 RN  HH-10 DISEASE MANAGEMENT  HH-2 PT  HH-3 OT      [facility] agency  [facility]   Status of service:  Completed, signed off Medicare Important Message given?   (If response is "NO", the following Medicare IM given date fields will be blank) Date Medicare IM given:   Date Additional Medicare IM given:    Discharge Disposition:  HOME W HOME HEALTH SERVICES  Per UR Regulation:  Reviewed for med. necessity/level of care/duration of stay  If discussed at Long Length of Stay Meetings, dates discussed:   08/01/2011    Comments:  08/03/11 1100 Spoke with Alinda Money, PA, and received new orders for [facility] PT/OT.  TC to [facility] and made referral for new services.  Faxed discharge summary, facesheet, HH orders to [phone]).  Pt. will dc today home. Tera Mater, RN, BSN NCM [phone]    6/3 debbie dowell rn,bsn may transf to tel.pt uses [facility] in [place].phone for [facility] in [place] [phone] and fax [phone]  5/31 15:00 debbie dowell rn,bsn spoke w pt/husband. hx of [facility]. husband would like hhrn again w [facility]. md order for chf screen. have given [facility] update and await final orders from md  at [facility].  5/29 10:16a debbie dowell rn,bsn [phone] heart failure screen. lives w husband, pcp dr Vista Lawman. hx of [facility].

## 2011-08-03 NOTE — Progress Notes (Signed)
Patient ID: [name], female   DOB: Aug 29, 1950, 61 y.o.   MRN: [id number]   SUBJECTIVE:  After I saw the patient yesterday there were further recommendations from diabetes coordination. We changed the orders. In addition the patient was seen again by the stroke team. It was felt that her brief neurologic change on June 5 had resolved and did not represent a significant new neurologic change. No further workup was recommended.  The patient looks good today. She did ambulate yesterday. She had no chest pain or shortness of breath. Her gait is not normal yet but she was helped with physical therapy. She is in good spirits. She is ready to go home.   Filed Vitals:   08/02/11 1054 08/02/11 1445 08/02/11 2121 08/03/11 0634  BP: 110/60 106/51 107/62 110/59  Pulse: 70 70 76 69  Temp:  98.2 F (36.8 C) 98.2 F (36.8 C) 97.9 F (36.6 C)  TempSrc:  Oral Oral Oral  Resp:  20 18 20   Height:      Weight:    157 lb (71.215 kg)  SpO2:  93% 94% 97%    Intake/Output Summary (Last 24 hours) at 08/03/11 0709 Last data filed at 08/03/11 1478  Gross per 24 hour  Intake    720 ml  Output   2400 ml  Net  -1680 ml    LABS: Basic Metabolic Panel:  Basename 08/02/11 0542 08/01/11 1520  NA 132* 133*  K 4.4 4.2  CL 93* 95*  CO2 25 29  GLUCOSE 263* 413*  BUN 31* 37*  CREATININE 0.90 1.07  CALCIUM 9.3 9.0  MG -- --  PHOS -- --   Liver Function Tests: No results found for this basename: AST:2,ALT:2,ALKPHOS:2,BILITOT:2,PROT:2,ALBUMIN:2 in the last 72 hours No results found for this basename: LIPASE:2,AMYLASE:2 in the last 72 hours CBC:  Basename 08/02/11 0542 08/01/11 0500  WBC 4.7 4.2  NEUTROABS -- --  HGB 9.5* 9.5*  HCT 28.5* 29.0*  MCV 82.4 81.7  PLT 164 157   Cardiac Enzymes: No results found for this basename: CKTOTAL:3,CKMB:3,CKMBINDEX:3,TROPONINI:3 in the last 72 hours BNP: No components found with this basename: POCBNP:3 D-Dimer: No results found for this basename:  DDIMER:2 in the last 72 hours Hemoglobin A1C: No results found for this basename: HGBA1C in the last 72 hours Fasting Lipid Panel: No results found for this basename: CHOL,HDL,LDLCALC,TRIG,CHOLHDL,LDLDIRECT in the last 72 hours Thyroid Function Tests: No results found for this basename: TSH,T4TOTAL,FREET3,T3FREE,THYROIDAB in the last 72 hours  RADIOLOGY: Dg Chest 2 View  07/26/2011  *RADIOLOGY REPORT*  Clinical Data: Congestive heart failure.  Weakness.  Shortness of breath.  CHEST - 2 VIEW  Comparison:  Portable upright chest radiograph 07/25/2011.  Findings: Borderline cardiomegaly is stable.  Bilateral pleural effusions are not significantly changed.  The right pleural effusion may be loculated given its lateral and posterior position. Mild pulmonary vascular congestion is stable.  Bibasilar airspace disease is unchanged, likely representing atelectasis.  IMPRESSION:  1.  Stable appearance of bilateral pleural effusions and airspace disease. 2.  The right pleural effusion may be loculated. 3.  Stable pulmonary vascular congestion.  Original Report Authenticated By: Jamesetta Orleans. MATTERN, M.D.   Ct Head Wo Contrast  08/01/2011  *RADIOLOGY REPORT*  Clinical Data: Decreased mental status.  Blood sugar over 400.  CT HEAD WITHOUT CONTRAST  Technique:  Contiguous axial images were obtained from the base of the skull through the vertex without contrast.  Comparison: 07/27/2011 MR.  07/26/2011 CT.  Findings: MR  detected left thalamic/midbrain infarct is now visualized by CT.  No CT evidence of new large acute thrombotic infarct or intracranial hemorrhage.  No intracranial mass lesion detected on this unenhanced exam.  Partially empty sella incidentally noted.  Opacification right mastoid air cells without obstructing mass causing eustachian tube dysfunction detected.  IMPRESSION: MR detected left thalamic/midbrain infarct is now visualized by CT.  No CT evidence of new large acute thrombotic infarct or  intracranial hemorrhage.  Opacification right mastoid air cells.  Original Report Authenticated By: Fuller Canada, M.D.   Ct Head Wo Contrast  07/26/2011  *RADIOLOGY REPORT*  Clinical Data: Somnolence, confusion, slurred speech and altered mental status after cardiac catheterization.  CT HEAD WITHOUT CONTRAST  Technique:  Contiguous axial images were obtained from the base of the skull through the vertex without contrast.  Comparison: MRI of the brain on 07/25/2011.  Findings: No evidence of acute infarction.  There is some contrast in the dural sinuses related to the catheterization procedure.  No evidence of acute intracranial hemorrhage, extra-axial fluid collections, mass effect, hydrocephalus or mass lesion.  The skull is unremarkable.  IMPRESSION: No acute findings by head CT.  Original Report Authenticated By: Reola Calkins, M.D.   Ct Head Wo Contrast  07/24/2011  *RADIOLOGY REPORT*  Clinical Data: Patient unresponsive with right gaze preference.  CT HEAD WITHOUT CONTRAST  Technique:  Contiguous axial images were obtained from the base of the skull through the vertex without contrast.  Comparison: None.  Findings: The brain demonstrates no evidence of hemorrhage, infarction, edema, mass effect, extra-axial fluid collection, hydrocephalus or mass lesion.  The skull is unremarkable.  IMPRESSION: No acute findings by CT of the brain.  Findings were communicated directly to Dr. Fonnie Jarvis.  Original Report Authenticated By: Reola Calkins, M.D.   Mr Maxine Glenn Head Wo Contrast  07/25/2011  *RADIOLOGY REPORT*  Clinical Data:  High blood pressure.  Diabetes.  Hyperlipidemia. Presenting with transient episode of left-sided weakness and right gaze preference.  MRI HEAD WITHOUT CONTRAST MRA HEAD WITHOUT CONTRAST  Technique: Multiplanar, multiecho pulse sequences of the brain and surrounding structures were obtained according to standard protocol without intravenous contrast.  Angiographic images of the head  were obtained using MRA technique without contrast.  Comparison: 07/24/2011 CT.  No comparison MR.  MRI HEAD  Findings:  No acute infarct.  No intracranial hemorrhage.  Mild small vessel disease type changes.  No intracranial mass lesion detected on this unenhanced exam.  No hydrocephalus.  Opacification mastoid air cells and middle ear cavities more notable on the right. No obstructing lesions seen in the region of posterior-superior nasopharynx.  Minimal paranasal sinus mucosal thickening.  Mild narrowing upper cervical canal.  Mild exophthalmos.  Major intracranial vascular structures are patent.  IMPRESSION: No acute infarct.  Mild small vessel disease type changes.  Opacification mastoid air cells and middle ear cavities more notable on the right.  MRA HEAD  Findings: Mild to moderate narrowing involving portions of the right internal carotid artery cavernous segment and mild narrowing and irregularity of the portions of the left internal carotid artery cavernous segment.  Small bulge M1 segment left middle cerebral artery.  This appears to be origin of a vessel. Tiny aneurysm not entirely excluded. This is at the limitation of the present exam.  Middle cerebral artery branch vessel irregularity.  Left vertebral artery is dominant.  Mild irregularity right vertebral artery.  Mild irregularity of the basilar artery without high-grade stenosis.  Nonvisualization PICAs.  Fusiform  dilated the P1 segment of the left posterior cerebral artery.  Mild narrowing P1 segment right posterior cerebral artery. Moderate to marked stenosis P1-2 segment right posterior cerebral artery.  Posterior cerebral artery distal branch vessel irregularity greater on the right.  IMPRESSION: Intracranial atherosclerotic type changes as detailed above.  Minimal bulge M1 segment left middle cerebral artery may represent origin of a vessel.  Tiny aneurysm not entirely excluded however this is at the limitation of the present exam.  Original  Report Authenticated By: Fuller Canada, M.D.   Mr Brain Wo Contrast  07/27/2011  *RADIOLOGY REPORT*  Clinical Data: 61 year old female with slurred speech, confusion. Cardiac catheterization yesterday.  Altered mental status.  MRI HEAD WITHOUT CONTRAST  Technique:  Multiplanar, multiecho pulse sequences of the brain and surrounding structures were obtained according to standard protocol without intravenous contrast.  Comparison: 07/25/2011.  Findings: The examination had to be discontinued prior to completion due to patient condition.  . Confluent restricted diffusion extending from the central and medial thalamus into the ventral mid brain (series 5 images 13 - 16).  Associated T2 hyperintensity.  No mass effect or evidence of hemorrhage.  Diffusion elsewhere is within normal limits.  No ventriculomegaly. No midline shift, mass effect, or evidence of mass lesion.  Major intracranial vascular flow voids are stable.  Gray-white matter signal outside of the area of acute ischemia is stable.  Continued right greater than left mastoid effusion.  Fluid layering in the pharynx. Visualized orbit soft tissues are within normal limits.  Negative visualized pituitary, cervicomedullary junction, and cervical spine.  Normal bone marrow signal.  IMPRESSION: 1.  Acute left PCA small vessel infarct involving the thalamus and ventral mid brain.  No mass effect or hemorrhage. 2.  Otherwise stable since 07/25/2011.  Original Report Authenticated By: Harley Hallmark, M.D.   Mr Brain Wo Contrast  07/25/2011  *RADIOLOGY REPORT*  Clinical Data:  High blood pressure.  Diabetes.  Hyperlipidemia. Presenting with transient episode of left-sided weakness and right gaze preference.  MRI HEAD WITHOUT CONTRAST MRA HEAD WITHOUT CONTRAST  Technique: Multiplanar, multiecho pulse sequences of the brain and surrounding structures were obtained according to standard protocol without intravenous contrast.  Angiographic images of the head were  obtained using MRA technique without contrast.  Comparison: 07/24/2011 CT.  No comparison MR.  MRI HEAD  Findings:  No acute infarct.  No intracranial hemorrhage.  Mild small vessel disease type changes.  No intracranial mass lesion detected on this unenhanced exam.  No hydrocephalus.  Opacification mastoid air cells and middle ear cavities more notable on the right. No obstructing lesions seen in the region of posterior-superior nasopharynx.  Minimal paranasal sinus mucosal thickening.  Mild narrowing upper cervical canal.  Mild exophthalmos.  Major intracranial vascular structures are patent.  IMPRESSION: No acute infarct.  Mild small vessel disease type changes.  Opacification mastoid air cells and middle ear cavities more notable on the right.  MRA HEAD  Findings: Mild to moderate narrowing involving portions of the right internal carotid artery cavernous segment and mild narrowing and irregularity of the portions of the left internal carotid artery cavernous segment.  Small bulge M1 segment left middle cerebral artery.  This appears to be origin of a vessel. Tiny aneurysm not entirely excluded. This is at the limitation of the present exam.  Middle cerebral artery branch vessel irregularity.  Left vertebral artery is dominant.  Mild irregularity right vertebral artery.  Mild irregularity of the basilar artery without high-grade stenosis.  Nonvisualization PICAs.  Fusiform dilated the P1 segment of the left posterior cerebral artery.  Mild narrowing P1 segment right posterior cerebral artery. Moderate to marked stenosis P1-2 segment right posterior cerebral artery.  Posterior cerebral artery distal branch vessel irregularity greater on the right.  IMPRESSION: Intracranial atherosclerotic type changes as detailed above.  Minimal bulge M1 segment left middle cerebral artery may represent origin of a vessel.  Tiny aneurysm not entirely excluded however this is at the limitation of the present exam.  Original Report  Authenticated By: Fuller Canada, M.D.   Dg Chest Port 1 View  07/25/2011  *RADIOLOGY REPORT*  Clinical Data: Congestive heart failure, improving shortness of breath  PORTABLE CHEST - 1 VIEW  Comparison: Portable chest x-ray of 07/24/2011  Findings: There may be slight improvement in the degree of moderate pulmonary vascular congestion.  Cardiomegaly, bilateral effusions, and mild congestion remain.  Median sternotomy sutures are noted.  IMPRESSION: Slight improvement in congestive heart failure pattern.  Bilateral pleural effusions remain.  Original Report Authenticated By: Juline Patch, M.D.   Dg Chest Port 1 View  07/24/2011  *RADIOLOGY REPORT*  Clinical Data: Shortness of breath.  PORTABLE CHEST - 1 VIEW  Comparison: 06/29/2011  Findings: Worsening interstitial prominence throughout the lungs, likely interstitial edema.  Mild cardiomegaly.  Small to moderate bilateral pleural effusions, right greater than left, slightly increased since prior study.  Bibasilar atelectasis.  Prior CABG.  IMPRESSION: Worsening interstitial prominence, likely interstitial edema/CHF. Enlarging bilateral effusions.  Original Report Authenticated By: Cyndie Chime, M.D.   Dg Chest Port 1v Same Day  07/30/2011  *RADIOLOGY REPORT*  Clinical Data: CHF, pleural effusions  PORTABLE CHEST - 1 VIEW SAME DAY  Comparison: Portable exam 1154 hours compared to 07/26/2011  Findings: Upper normal heart size post CABG. Mediastinal contours normal. Improved interstitial edema. Bibasilar effusions, probably not significantly changed. Upper lungs clear. No pneumothorax or acute osseous findings.  IMPRESSION: Post CABG. Improved interstitial edema with persistent small bibasilar effusions.  Original Report Authenticated By: Lollie Marrow, M.D.    PHYSICAL EXAM    Patient is oriented to person time and place. Affect is normal. There is no jugular venous distention. Lungs reveal scattered rhonchi. There is no respiratory distress. Cardiac  exam reveals S1 and S2. There no clicks or significant murmurs. The abdomen is soft. There is no significant peripheral edema. There no musculoskeletal deformities.   TELEMETRY:   I have personally reviewed telemetry. There is normal sinus rhythm. The patient has not had any further ventricular ectopy.   ASSESSMENT AND PLAN:   *Acute on chronic systolic heart failure    Patient's CHF was resolved. She is to go home on her current medications. Her last chest x-ray raised the question of some small residual pleural effusions. I feel that she is clinically stable. I feel that we should not change her diuretics any further.   DM type 2 with diabetic peripheral neuropathy    Yesterday the patient's in-hospital dose of Lantus was changed. Her sugars continue to be on the high side. As she is discharged home she should be discharged on her prior home dose of insulin.   NSTEMI (non-ST elevated myocardial infarction)    Catheterization has been done. Medical therapy is recommended.   Hyperkalemia     Potassium has been stable. She has had hyperkalemia with ACE inhibitors in the past. It is now well-documented in the chart that we will not be starting an ACE inhibitor at this time  because of problems with hyperkalemia over time. She is on hydralazine.   Cardiomyopathy     The patient is now on appropriate medicines for her left ventricular dysfunction.   Hematuria       The patient did have Klebsiella in her urine. She was assessed by the infectious disease team. She had come to the hospital on Biaxin. It became clear that there was no ongoing reason for the Biaxin. This was not treating Klebsiella. She was placed on Levaquin with a recommended length of therapy by infectious disease. She'll be discharged on this to complete the course at home.   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 She was seen by cardiac surgery. There are recommendations for  outpatient followup.   CVA (cerebral infarction)    The patient had a neurologic event here the hospital. She has improved. Neuro recommendations are again repeated in yesterday's note. She has an eye patch. She should see Dr.Sethi as an outpatient in 2 months. This appointment should be made for the patient today before she leaves the hospital. The problem with dysarthria on August 01, 2011 improved. Neurology recommended no further workup.   Ventricular tachycardia    On one occasion the patient had 5 beats of ventricular tachycardia. Her potassium was normal. Magnesium has been ordered for today but the result is not yet available. We will see this results and decide if she needs any magnesium as an outpatient.  Rehabilitation   The patient was assessed fully by the inpatient rehabilitation team. It was felt that her level of function was too high for inpatient care. Outpatient OT and PT are recommended. We will be sure that this is all finalize an in place as she is discharged home today.  Plan should be made for the patient to see neurology in 2 months as outlined above. Appointment should be made for her to see Dr. Diona Browner of cardiology in the regional office over the next 2-3 weeks. Information should be sent to her primary physician in [place].  Willa Rough 08/03/2011 7:09 AM

## 2011-08-05 LAB — HEAVY METALS SCREEN, URINE

## 2011-08-10 ENCOUNTER — Other Ambulatory Visit (HOSPITAL_COMMUNITY): Payer: 59

## 2011-08-10 ENCOUNTER — Ambulatory Visit (INDEPENDENT_AMBULATORY_CARE_PROVIDER_SITE_OTHER): Payer: 59 | Admitting: Cardiothoracic Surgery

## 2011-08-10 ENCOUNTER — Encounter: Payer: Self-pay | Admitting: Cardiothoracic Surgery

## 2011-08-10 VITALS — BP 117/67 | HR 80 | Resp 16 | Ht 65.0 in | Wt 155.2 lb

## 2011-08-10 DIAGNOSIS — T8132XA Disruption of internal operation (surgical) wound, not elsewhere classified, initial encounter: Secondary | ICD-10-CM

## 2011-08-10 DIAGNOSIS — I6340 Cerebral infarction due to embolism of unspecified cerebral artery: Secondary | ICD-10-CM

## 2011-08-10 DIAGNOSIS — I2510 Atherosclerotic heart disease of native coronary artery without angina pectoris: Secondary | ICD-10-CM

## 2011-08-10 DIAGNOSIS — IMO0001 Reserved for inherently not codable concepts without codable children: Secondary | ICD-10-CM

## 2011-08-10 DIAGNOSIS — IMO0002 Reserved for concepts with insufficient information to code with codable children: Secondary | ICD-10-CM

## 2011-08-10 DIAGNOSIS — Z4800 Encounter for change or removal of nonsurgical wound dressing: Secondary | ICD-10-CM

## 2011-08-10 NOTE — Progress Notes (Signed)
PCP is Leone Payor, MD Referring Provider is Rollene Rotunda, MD  Chief Complaint  Patient presents with  . Routine Post Op    follo up sternal wound                     [address]            [name] [address]          [phone]      HPI: Patient returns or wound check. The patient is a brittle diabetic status post mastectomy who a CABG x4 last year. She had nonhealing of the midsternal incision and required wireremoval and will temporary wound VAC which has been discontinued. A small area of exposed sternum persisted and she was treated with a single hyperbaric oxygen exposure. At the end of the hyperbaric oxygen treatment she developed CHF and was admitted the hospital and diuresed. She had a cardiac catheterization to recheck her bypass grafts which were all patent. However she experienced a stroke following the cardiac cath with visual impairment and some right-sided weakness. This is improved. The sternal wound is almost completely healed with a small pinhole 2 mm defect without drainage.  Past Medical History  Diagnosis Date  . Coronary atherosclerosis of native coronary artery     BMS circ 2001, DES LAD 2005, DES LAD and RCA 2008, subsequent CABG  . Drug allergy     Allergy to Plavix (hives) - took Ticlid in past  . Essential hypertension, benign   . Mixed hyperlipidemia   . Type 2 diabetes mellitus   . Myocardial infarction ~ 2002  . Arthritis     rheumatoid  . Depression   . Peripheral neuropathy   . Peptic ulcer disease ~ 1975  . Nephrolithiasis 02/07/2011  . Angina   . Breast cancer     Chemo and mastectomy- right  . Diabetes mellitus   . Chronic systolic heart failure     LVEF 40-45%  . Anxiety   . Sternal wound infection     CABG wound    Past Surgical History  Procedure Date  . Mastectomy 1988    right  . Cholecystectomy 1969  . Abdominal hysterectomy 1989  . Tonsillectomy and adenoidectomy 1979  . Appendectomy 1969  . Tubal  ligation 1972  . Coronary artery bypass graft 01/11/2011    Procedure: CORONARY ARTERY BYPASS GRAFTING (CABG);  Surgeon: Kathlee Nations Suann Larry, MD;  Location: [facility] OR;  Service: Open Heart Surgery;  Laterality: N/A;  cabg x four, using left internal mammary artery and right leg greater saphenous vein harvestede endoscopically  . Sternal wound debridement 03/01/2011    Procedure: STERNAL WOUND DEBRIDEMENT;  Surgeon: Kathlee Nations Suann Larry, MD;  Location: [facility] OR;  Service: Open Heart Surgery;  Laterality: N/A;  . Application of wound vac 03/01/2011    Procedure: APPLICATION OF WOUND VAC;  Surgeon: Kathlee Nations Suann Larry, MD;  Location: [facility] OR;  Service: Open Heart Surgery;  Laterality: N/A;  . Breast surgery   . Sternal wires removal 04/02/2011    Procedure: STERNAL WIRES REMOVAL;  Surgeon: Kathlee Nations Suann Larry, MD;  Location: [facility] OR;  Service: Open Heart Surgery;  Laterality: N/A;    Family History  Problem Relation Age of Onset  . Anesthesia problems Neg Hx     Social History History  Substance Use Topics  . Smoking status: Former Smoker -- 1.0 packs/day for 20 years    Types: Cigarettes  Quit date: 02/27/2007  . Smokeless tobacco: Never Used  . Alcohol Use: No    Current Outpatient Prescriptions  Medication Sig Dispense Refill  . aspirin 325 MG tablet Take 325 mg by mouth daily.       . carvedilol (COREG) 6.25 MG tablet Take 1 tablet (6.25 mg total) by mouth 2 (two) times daily.  60 tablet  3  . furosemide (LASIX) 40 MG tablet Take 40 mg by mouth every other day.      . gabapentin (NEURONTIN) 300 MG capsule Take 300 mg by mouth 3 (three) times daily.       . hydrALAZINE (APRESOLINE) 25 MG tablet Take 0.5 tablets (12.5 mg total) by mouth every 8 (eight) hours.  42 tablet  3  . insulin glargine (LANTUS) 100 UNIT/ML injection Inject 35 Units into the skin 2 (two) times daily.       . insulin lispro (HUMALOG) 100 UNIT/ML injection Inject 20 Units into the skin 3 (three) times daily before meals.         . isosorbide mononitrate (IMDUR) 30 MG 24 hr tablet Take 1 tablet (30 mg total) by mouth daily.  30 tablet  3  . levofloxacin (LEVAQUIN) 500 MG tablet Take 1 tablet (500 mg total) by mouth daily.  4 tablet  0  . Multiple Vitamin (MULITIVITAMIN WITH MINERALS) TABS Take 1 tablet by mouth daily.      Marland Kitchen PARoxetine (PAXIL) 20 MG tablet Take 20 mg by mouth every morning.       . simvastatin (ZOCOR) 20 MG tablet Take 1 tablet (20 mg total) by mouth at bedtime.  90 tablet  1  . traMADol (ULTRAM) 50 MG tablet Take 50 mg by mouth every 6 (six) hours as needed. For pain      . DISCONTD: furosemide (LASIX) 40 MG tablet Take 1 tablet (40 mg total) by mouth daily.  30 tablet  3    Allergies  Allergen Reactions  . Clopidogrel Bisulfate Hives  . Codeine Nausea And Vomiting  . Morphine Nausea And Vomiting    Review of Systems no fever no shortness of breath no drainage from the sternum hyper sensitivity of the upper chest  BP 117/67  Pulse 80  Resp 16  Ht 5\' 5"  (1.651 m)  Wt 155 lb 3.2 oz (70.398 kg)  BMI 25.83 kg/m2  SpO2 95%  LMP 01/05/1996 Physical Exam Alert and appropriate No focal motor or strength deficits noted, walks well with a walker Sternum healed with healthy scar except for one small pinhole at the midsternal area which is being treated with a small piece of silver alginate. No edema of the ankles Diagnostic Tests: None  Impression: Almost complete healing of the wound after a single exposure hyperbaric oxygen therapy. Some improvement of stroke following relook cardiac catheterization showing patent grafts  Plan: Return for wound check and four-week subcutaneous current medications

## 2011-08-17 ENCOUNTER — Encounter (HOSPITAL_COMMUNITY): Payer: Self-pay | Admitting: Oncology

## 2011-08-17 ENCOUNTER — Encounter (HOSPITAL_COMMUNITY): Payer: 59 | Attending: Oncology | Admitting: Oncology

## 2011-08-17 VITALS — BP 114/63 | HR 74 | Temp 98.1°F | Wt 164.9 lb

## 2011-08-17 DIAGNOSIS — R161 Splenomegaly, not elsewhere classified: Secondary | ICD-10-CM

## 2011-08-17 DIAGNOSIS — D638 Anemia in other chronic diseases classified elsewhere: Secondary | ICD-10-CM

## 2011-08-17 DIAGNOSIS — I509 Heart failure, unspecified: Secondary | ICD-10-CM

## 2011-08-17 NOTE — Patient Instructions (Signed)
[facility]  Discharge Instructions  RECOMMENDATIONS MADE BY THE CONSULTANT AND ANY TEST RESULTS WILL BE SENT TO YOUR REFERRING DOCTOR.   Return to clinic August 1st for lab work. The iron test results take 24 hrs. We will have you come back to see Dr.Neijstrom August 2nd. Any issues/concerns prior to then report to this clinic [phone]   I acknowledge that I have been informed and understand all the instructions given to me and received a copy. I do not have any more questions at this time, but understand that I may call the Specialty Clinic at [facility] at [phone] during business hours should I have any further questions or need assistance in obtaining follow-up care.    __________________________________________  _____________  __________ Signature of Patient or Authorized Representative            Date                   Time    __________________________________________ Nurse's Signature

## 2011-08-17 NOTE — Progress Notes (Signed)
This office note has been dictated.

## 2011-08-17 NOTE — Progress Notes (Signed)
CC:   [name], M.D.  DIAGNOSES: 1. Anemia of chronic disease. 2. Thrombocytopenia which has resolved. 3. 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 4. Recent stroke after cardiac catheterization in May 2013 after     development of congestive heart failure during a hyperbaric     treatment for a wound healing issue. 5. Coronary artery disease. 6. Diabetes mellitus. 7. Hyperlipidemia. 8. Right-sided breast cancer in 1988 status post surgery and     chemotherapy. 9. History of peripheral neuropathy. 10.History of possible rheumatoid arthritis in the past. 11.History of peptic ulcer disease in 1975. 12.Tonsillectomy and adenoidectomy in 1979. 13.Abdominal hysterectomy in 1989. 14.History of tubal ligation years ago. 15.History of chronic left lower quadrant abdominal discomfort off and     on for many years.  She has been evaluated by Dr. Sheryn Bison     with a CT scan in 2007 which revealed a fatty infiltration of liver     and spleen at the upper limits of normal at that time versus mild     enlargement at my review.  [name] was supposed to have blood work the other day but was in the hospital.  The blood work that she had done there that we can comment on shows that her platelets are still normal, white count was low on one occasion but basically normal most of the time.  She had an elevated pro B natriuretic hormone level over 1200 and they felt that she was in heart failure, got her catheterized and during that she had a small stroke and is now followed by one of the neurologists in [place].  MEDICATIONS:  Her medications are in the chart.  She is on aspirin along with other things for her diabetes, hypercholesterolemia, etc.  Her husband makes a statement that she bleeds fairly freely and I suspect that it is the aspirin, it is  certainly not her platelet level.  PHYSICAL EXAMINATION:  Today she is pale.  She is weak in appearance but she is speaking clearly.  She is just fatigued looking.  Her abdomen though was soft.  I did not detect hepatomegaly nor splenomegaly, and it was not distended.  Bowel sounds were diminished.  She has no leg edema and no petechiae.  So what I would like to do is do the blood work in early August that we were going to do a week ago, namely a CBC, iron studies and ferritin and just follow her.  I do not think we should be worried too much about bringing her hemoglobin up much above where it is at 9.9 g to 10 g. With her heart failure history and her stroke history I think it will not hurt her significantly to be mildly anemic, so we will see what these studies are again in August and I will see her then and hopefully she will continue to recover from the stroke issue which sounds like it may have been posterior circulation.    ______________________________ Ladona Horns. Mariel Sleet, MD ESN/MEDQ  D:  08/17/2011  T:  08/17/2011  Job:  [id number]

## 2011-08-20 ENCOUNTER — Telehealth: Payer: Self-pay | Admitting: Cardiology

## 2011-08-20 NOTE — Telephone Encounter (Signed)
[facility] IN [place] [phone] REPORTING THAT SHE HAS BEEN HAVING SEVERAL DAYS OF CHEST PAIN AND COUGHING, NO SWELLING. 118/58 P 78 TEMP 98.9 O2 97% RESP 18-22.

## 2011-08-21 NOTE — Telephone Encounter (Signed)
**Note De-Identified Chaitra Mast Obfuscation** Mrs. Chichester states that after calling office with CP yesterday she realized that she had not taken her "heart" medication. She states she took med at around 5 pm yesterday and the pain eased up and finally stopped. Attempted to schedule an appt. With NP today but pt. declined stating that she feels better. Pt. Is advised to call office if CP returns or call 911, She verbalized understanding.Claudie Leach

## 2011-08-24 ENCOUNTER — Encounter: Payer: 59 | Admitting: Cardiology

## 2011-09-03 ENCOUNTER — Telehealth (HOSPITAL_COMMUNITY): Payer: Self-pay | Admitting: Oncology

## 2011-09-05 ENCOUNTER — Ambulatory Visit: Payer: 59 | Admitting: Cardiothoracic Surgery

## 2011-09-14 ENCOUNTER — Encounter: Payer: 59 | Admitting: Cardiology

## 2011-09-17 ENCOUNTER — Ambulatory Visit: Payer: 59 | Admitting: Endocrinology

## 2011-09-19 ENCOUNTER — Ambulatory Visit (INDEPENDENT_AMBULATORY_CARE_PROVIDER_SITE_OTHER): Payer: 59 | Admitting: Cardiothoracic Surgery

## 2011-09-19 VITALS — BP 131/70 | HR 77 | Resp 16 | Ht 65.0 in | Wt 167.0 lb

## 2011-09-19 DIAGNOSIS — Z4800 Encounter for change or removal of nonsurgical wound dressing: Secondary | ICD-10-CM

## 2011-09-19 DIAGNOSIS — I2510 Atherosclerotic heart disease of native coronary artery without angina pectoris: Secondary | ICD-10-CM

## 2011-09-19 NOTE — Progress Notes (Signed)
PCP is Leone Payor, MD Referring Provider is Leone Payor, MD                                                                                               [address] [place]            [name] [address]          [phone]     Ready to Chief Complaint  Patient presents with  . Routine Post Op    4 week f/u sternal wound    HPI: The patient returns for a wound check. She is had a small pinhole opening in the lower portion of the incision over the sternum. Recently this opened and had some bloody drainage. There's no fever. She's not on antibiotics currently. The patient is a brittle diabetic status post mastectomy.   Past Medical History  Diagnosis Date  . Coronary atherosclerosis of native coronary artery     BMS circ 2001, DES LAD 2005, DES LAD and RCA 2008, subsequent CABG  . Drug allergy     Allergy to Plavix (hives) - took Ticlid in past  . Essential hypertension, benign   . Mixed hyperlipidemia   . Type 2 diabetes mellitus   . Myocardial infarction ~ 2002  . Arthritis     rheumatoid  . Depression   . Peripheral neuropathy   . Peptic ulcer disease ~ 1975  . Nephrolithiasis 02/07/2011  . Angina   . Breast cancer     Chemo and mastectomy- right  . Diabetes mellitus   . Chronic systolic heart failure     LVEF 40-45%  . Anxiety   . Sternal wound infection     CABG wound  . Stroke 07/2011    Past Surgical History  Procedure Date  . Mastectomy 1988    right  . Cholecystectomy 1969  . Abdominal hysterectomy 1989  . Tonsillectomy and adenoidectomy 1979  . Appendectomy 1969  . Tubal ligation 1972  . Coronary artery bypass graft 01/11/2011    Procedure: CORONARY ARTERY BYPASS GRAFTING (CABG);  Surgeon: Kathlee Nations Suann Larry, MD;  Location: [facility] OR;  Service: Open Heart Surgery;  Laterality: N/A;  cabg x four, using left internal mammary artery and right leg greater saphenous vein harvestede endoscopically  . Sternal wound debridement 03/01/2011   Procedure: STERNAL WOUND DEBRIDEMENT;  Surgeon: Kathlee Nations Suann Larry, MD;  Location: [facility] OR;  Service: Open Heart Surgery;  Laterality: N/A;  . Application of wound vac 03/01/2011    Procedure: APPLICATION OF WOUND VAC;  Surgeon: Kathlee Nations Suann Larry, MD;  Location: [facility] OR;  Service: Open Heart Surgery;  Laterality: N/A;  . Breast surgery   . Sternal wires removal 04/02/2011    Procedure: STERNAL WIRES REMOVAL;  Surgeon: Kathlee Nations Suann Larry, MD;  Location: [facility] OR;  Service: Open Heart Surgery;  Laterality: N/A;    Family History  Problem Relation Age of Onset  . Anesthesia problems Neg Hx     Social History History  Substance Use Topics  . Smoking status: Former Smoker -- 1.0 packs/day for 20  years    Types: Cigarettes    Quit date: 02/27/2007  . Smokeless tobacco: Never Used  . Alcohol Use: No    Current Outpatient Prescriptions  Medication Sig Dispense Refill  . aspirin 325 MG tablet Take 325 mg by mouth daily.       . carvedilol (COREG) 6.25 MG tablet Take 1 tablet (6.25 mg total) by mouth 2 (two) times daily.  60 tablet  3  . furosemide (LASIX) 40 MG tablet Take 40 mg by mouth every other day.      . gabapentin (NEURONTIN) 300 MG capsule Take 300 mg by mouth 3 (three) times daily.       . insulin glargine (LANTUS) 100 UNIT/ML injection Inject 35 Units into the skin 2 (two) times daily.       . insulin lispro (HUMALOG) 100 UNIT/ML injection Inject 20 Units into the skin 3 (three) times daily before meals.       . isosorbide mononitrate (IMDUR) 30 MG 24 hr tablet Take 1 tablet (30 mg total) by mouth daily.  30 tablet  3  . metFORMIN (GLUCOPHAGE) 500 MG tablet Take 500 mg by mouth 2 (two) times daily with a meal.      . Multiple Vitamin (MULITIVITAMIN WITH MINERALS) TABS Take 1 tablet by mouth daily.      Marland Kitchen PARoxetine (PAXIL) 20 MG tablet Take 20 mg by mouth every morning.       . simvastatin (ZOCOR) 20 MG tablet Take 1 tablet (20 mg total) by mouth at bedtime.  90 tablet  1  . traMADol  (ULTRAM) 50 MG tablet Take 50 mg by mouth every 6 (six) hours as needed. For pain      . hydrALAZINE (APRESOLINE) 25 MG tablet Take 0.5 tablets (12.5 mg total) by mouth every 8 (eight) hours.  42 tablet  3    Allergies  Allergen Reactions  . Clopidogrel Bisulfate Hives  . Codeine Nausea And Vomiting  . Morphine Nausea And Vomiting    Review of Systems no fever  BP 131/70  Pulse 77  Resp 16  Ht 5\' 5"  (1.651 m)  Wt 167 lb (75.751 kg)  BMI 27.79 kg/m2  SpO2 95%  LMP 01/05/1996 Physical Exam Opening enlarged but still very superficial with only in the dermis admitting half a tip of 1 Q-tip. Clean with peroxide and saline and a new gauze wet-to-dry dressing placed. Antibiotics by mouth Keflex started nothing to culture.  Diagnostic Tests: None  Impression:  Recurrent focal nonhealing area lower sternal incision. We'll start daily dressing changes with silver impregnated gauze which the patient has at home. We'll start Keflex. I'll see her in the clinic in one week.

## 2011-09-25 ENCOUNTER — Other Ambulatory Visit (HOSPITAL_COMMUNITY): Payer: 59

## 2011-09-26 ENCOUNTER — Ambulatory Visit (INDEPENDENT_AMBULATORY_CARE_PROVIDER_SITE_OTHER): Payer: 59 | Admitting: Cardiothoracic Surgery

## 2011-09-26 ENCOUNTER — Ambulatory Visit (HOSPITAL_COMMUNITY): Payer: 59 | Admitting: Oncology

## 2011-09-26 ENCOUNTER — Encounter: Payer: Self-pay | Admitting: Cardiothoracic Surgery

## 2011-09-26 VITALS — BP 128/72 | HR 80 | Temp 97.3°F | Resp 18 | Ht 65.0 in | Wt 167.0 lb

## 2011-09-26 DIAGNOSIS — IMO0001 Reserved for inherently not codable concepts without codable children: Secondary | ICD-10-CM

## 2011-09-26 DIAGNOSIS — E119 Type 2 diabetes mellitus without complications: Secondary | ICD-10-CM

## 2011-09-26 DIAGNOSIS — Z4800 Encounter for change or removal of nonsurgical wound dressing: Secondary | ICD-10-CM

## 2011-09-26 DIAGNOSIS — Z951 Presence of aortocoronary bypass graft: Secondary | ICD-10-CM

## 2011-09-26 NOTE — Progress Notes (Signed)
PCP is Leone Payor, MD Referring Provider is Jonelle Sidle, MD  Chief Complaint  Patient presents with  . Routine Post Op    1 week f/u Wound Check of lower sternal incision site    HPI: The patient returns for 2 week followup of a nonhealing sternal wound after CABG last year. She is currently treating the small area with silver dressings. There's no purulence or drainage. There is no surrounding cellulitis. 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 On exam there is a small portion of exposed bone is probably responsible for the a nonhealing. We'll continue the topical silver dressings in  the small area and will refer the patient to the wound center again this time for evaluation of possible topical therapies such as a-CELL  Past Medical History  Diagnosis Date  . Coronary atherosclerosis of native coronary artery     BMS circ 2001, DES LAD 2005, DES LAD and RCA 2008, subsequent CABG  . Drug allergy     Allergy to Plavix (hives) - took Ticlid in past  . Essential hypertension, benign   . Mixed hyperlipidemia   . Type 2 diabetes mellitus   . Myocardial infarction ~ 2002  . Arthritis     rheumatoid  . Depression   . Peripheral neuropathy   . Peptic ulcer disease ~ 1975  . Nephrolithiasis 02/07/2011  . Angina   . Breast cancer     Chemo and mastectomy- right  . Diabetes mellitus   . Chronic systolic heart failure     LVEF 40-45%  . Anxiety   . Sternal wound infection     CABG wound  . Stroke 07/2011    Past Surgical History  Procedure Date  . Mastectomy 1988    right  . Cholecystectomy 1969  . Abdominal hysterectomy 1989  . Tonsillectomy and adenoidectomy 1979  . Appendectomy 1969  . Tubal ligation 1972  . Coronary artery bypass graft 01/11/2011    Procedure: CORONARY ARTERY BYPASS GRAFTING (CABG);  Surgeon: Kathlee Nations Suann Larry, MD;  Location: [facility] OR;  Service: Open Heart Surgery;  Laterality: N/A;  cabg x four, using left internal mammary artery and right leg greater  saphenous vein harvestede endoscopically  . Sternal wound debridement 03/01/2011    Procedure: STERNAL WOUND DEBRIDEMENT;  Surgeon: Kathlee Nations Suann Larry, MD;  Location: [facility] OR;  Service: Open Heart Surgery;  Laterality: N/A;  . Application of wound vac 03/01/2011    Procedure: APPLICATION OF WOUND VAC;  Surgeon: Kathlee Nations Suann Larry, MD;  Location: [facility] OR;  Service: Open Heart Surgery;  Laterality: N/A;  . Breast surgery   . Sternal wires removal 04/02/2011    Procedure: STERNAL WIRES REMOVAL;  Surgeon: Kathlee Nations Suann Larry, MD;  Location: [facility] OR;  Service: Open Heart Surgery;  Laterality: N/A;    Family History  Problem Relation Age of Onset  . Anesthesia problems Neg Hx     Social History History  Substance Use Topics  . Smoking status: Former Smoker -- 1.0 packs/day for 20 years    Types: Cigarettes    Quit date: 02/27/2007  . Smokeless tobacco: Never Used  . Alcohol Use: No    Current Outpatient Prescriptions  Medication Sig Dispense Refill  . aspirin 325 MG tablet Take 325 mg by mouth daily.       . carvedilol (COREG) 6.25 MG tablet Take 1 tablet (6.25 mg total) by mouth 2 (two) times daily.  60 tablet  3  . cephALEXin (KEFLEX) 500  MG capsule Take 500 mg by mouth 4 (four) times daily.       . furosemide (LASIX) 40 MG tablet Take 40 mg by mouth every other day.      . gabapentin (NEURONTIN) 300 MG capsule Take 300 mg by mouth 3 (three) times daily.       . hydrALAZINE (APRESOLINE) 25 MG tablet Take 0.5 tablets (12.5 mg total) by mouth every 8 (eight) hours.  42 tablet  3  . insulin glargine (LANTUS) 100 UNIT/ML injection Inject 35 Units into the skin 2 (two) times daily.       . insulin lispro (HUMALOG) 100 UNIT/ML injection Inject 20 Units into the skin 3 (three) times daily before meals.       . isosorbide mononitrate (IMDUR) 30 MG 24 hr tablet Take 1 tablet (30 mg total) by mouth daily.  30 tablet  3  . metFORMIN (GLUCOPHAGE) 500 MG tablet Take 500 mg by mouth 2 (two) times daily with  a meal.      . Multiple Vitamin (MULITIVITAMIN WITH MINERALS) TABS Take 1 tablet by mouth daily.      Marland Kitchen PARoxetine (PAXIL) 20 MG tablet Take 20 mg by mouth every morning.       . simvastatin (ZOCOR) 20 MG tablet Take 1 tablet (20 mg total) by mouth at bedtime.  90 tablet  1  . traMADol (ULTRAM) 50 MG tablet Take 50 mg by mouth every 6 (six) hours as needed. For pain        Allergies  Allergen Reactions  . Clopidogrel Bisulfate Hives  . Codeine Nausea And Vomiting  . Morphine Nausea And Vomiting    Review of Systems  BP 128/72  Pulse 80  Temp 97.3 F (36.3 C) (Oral)  Resp 18  Ht 5\' 5"  (1.651 m)  Wt 167 lb (75.751 kg)  BMI 27.79 kg/m2  SpO2 94%  LMP 01/05/1996 Physical Exam The small area is clean. There is a small area of exposed bone at the depths of the wound. Wet-to-dry dressing is applied replacement a silver dressing at home with the patient's home dressing supplies. We will [facility] the [facility] for evaluation of other possible topical therapies. 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 Is not currently on antibiotics.  Diagnostic Tests:   Impression: Nonhealing sternal wound refer to [facility]  Plan: Return for cardiac followup next month

## 2011-09-27 ENCOUNTER — Other Ambulatory Visit (HOSPITAL_COMMUNITY): Payer: 59

## 2011-09-28 ENCOUNTER — Ambulatory Visit (HOSPITAL_COMMUNITY): Payer: 59 | Admitting: Oncology

## 2011-10-04 ENCOUNTER — Encounter: Payer: 59 | Admitting: Cardiology

## 2011-10-08 ENCOUNTER — Encounter (HOSPITAL_BASED_OUTPATIENT_CLINIC_OR_DEPARTMENT_OTHER): Payer: 59 | Attending: Plastic Surgery

## 2011-10-08 DIAGNOSIS — Z79899 Other long term (current) drug therapy: Secondary | ICD-10-CM | POA: Insufficient documentation

## 2011-10-08 DIAGNOSIS — Z853 Personal history of malignant neoplasm of breast: Secondary | ICD-10-CM | POA: Insufficient documentation

## 2011-10-08 DIAGNOSIS — Z951 Presence of aortocoronary bypass graft: Secondary | ICD-10-CM | POA: Insufficient documentation

## 2011-10-08 DIAGNOSIS — E119 Type 2 diabetes mellitus without complications: Secondary | ICD-10-CM | POA: Insufficient documentation

## 2011-10-08 DIAGNOSIS — T8189XA Other complications of procedures, not elsewhere classified, initial encounter: Secondary | ICD-10-CM | POA: Insufficient documentation

## 2011-10-08 DIAGNOSIS — Z9010 Acquired absence of unspecified breast and nipple: Secondary | ICD-10-CM | POA: Insufficient documentation

## 2011-10-08 DIAGNOSIS — I10 Essential (primary) hypertension: Secondary | ICD-10-CM | POA: Insufficient documentation

## 2011-10-08 DIAGNOSIS — Y832 Surgical operation with anastomosis, bypass or graft as the cause of abnormal reaction of the patient, or of later complication, without mention of misadventure at the time of the procedure: Secondary | ICD-10-CM | POA: Insufficient documentation

## 2011-10-08 DIAGNOSIS — Z7982 Long term (current) use of aspirin: Secondary | ICD-10-CM | POA: Insufficient documentation

## 2011-10-10 ENCOUNTER — Other Ambulatory Visit (HOSPITAL_COMMUNITY): Payer: 59

## 2011-10-12 ENCOUNTER — Ambulatory Visit (HOSPITAL_COMMUNITY): Payer: 59 | Admitting: Oncology

## 2011-10-16 ENCOUNTER — Other Ambulatory Visit: Payer: Self-pay | Admitting: Cardiothoracic Surgery

## 2011-10-16 DIAGNOSIS — I2510 Atherosclerotic heart disease of native coronary artery without angina pectoris: Secondary | ICD-10-CM

## 2011-10-16 NOTE — Progress Notes (Signed)
[facility]  NAME:  [name], [name]                   ACCOUNT NO.:  MEDICAL RECORD NO.:  [id number]      DATE OF BIRTH:  16-Apr-1950  PHYSICIAN:  Wayland Denis, DO       VISIT DATE:  10/15/2011                                  OFFICE VISIT   HISTORY OF PRESENT ILLNESS:  The patient is a 61 year old female who underwent cardiac bypass in November 2012.  She had sternal wires removed in February 2013.  Unfortunately, she has had nonhealing sternal wound on the inferior aspect of the wound since that time.  They have tried different silver dressings and it does not seem to be healing up. It does not appear to be infected, but the size is not improving.  PAST MEDICAL HISTORY:  She has got multiple medical conditions including diabetes, hypertension, and history of right breast mastectomy from breast cancer.  She also has high cholesterol as well.  ALLERGIES:  Her allergies include MORPHINE, CODEINE, and CLOPIDOGREL.  MEDICATIONS:  Include aspirin, gabapentin, Lantus Humalog, metformin, multivitamin, paroxetine, simvastatin, and tramadol.  SOCIAL HISTORY:  The patient lives at home.  Denies smoking.  She is being followed by Dr. Donata Clay.  She has a chest x-ray order for Friday.  REVIEW OF SYSTEMS:  Otherwise negative.  PAST SURGICAL HISTORY:  Includes right mastectomy, cholecystectomy, abdominal hysterectomy, tonsillectomy, appendectomy, tubal ligation, coronary artery bypass graft with sternal wound debridement and sternal wire removal.  PHYSICAL EXAMINATION:  GENERAL:  She is alert, oriented, cooperative, not in any acute distress.  She is pleasant. HEENT: Pupils are equal.  Extraocular muscles are intact. NECK:  No cervical lymphadenopathy. LUNGS:  Breathing is unlabored. HEART: Regular. ABDOMEN:  Soft.  ASSESSMENT AND PLAN:  The wound is small, but deep, the exact sizes are noted.  It does not appear to have any purulent drainage.  It does  seem to track to bone.  There is no surrounding swelling or erythema.  The skin is not warm to touch.  It is at the base of the sternum right between her breasts toward the medial aspect of the inframammary fold.  Her graft donor sites are well healed and look good.  We will continue with Aquacel Ag and work toward getting her to the operating room for irrigation and debridement.     Wayland Denis, DO     CS/MEDQ  D:  10/15/2011  T:  10/16/2011  Job:  [id number]

## 2011-10-19 ENCOUNTER — Ambulatory Visit (INDEPENDENT_AMBULATORY_CARE_PROVIDER_SITE_OTHER): Payer: 59 | Admitting: Cardiothoracic Surgery

## 2011-10-19 ENCOUNTER — Encounter: Payer: Self-pay | Admitting: Cardiothoracic Surgery

## 2011-10-19 ENCOUNTER — Ambulatory Visit
Admission: RE | Admit: 2011-10-19 | Discharge: 2011-10-19 | Disposition: A | Discharge status: Home / Self Care | Payer: 59 | Source: Ambulatory Visit | Attending: Cardiothoracic Surgery | Admitting: Cardiothoracic Surgery

## 2011-10-19 VITALS — BP 113/63 | HR 76 | Resp 20 | Ht 65.0 in | Wt 165.0 lb

## 2011-10-19 DIAGNOSIS — Z951 Presence of aortocoronary bypass graft: Secondary | ICD-10-CM

## 2011-10-19 DIAGNOSIS — Z5189 Encounter for other specified aftercare: Secondary | ICD-10-CM

## 2011-10-19 DIAGNOSIS — I2510 Atherosclerotic heart disease of native coronary artery without angina pectoris: Secondary | ICD-10-CM

## 2011-10-19 NOTE — Progress Notes (Signed)
PCP is Leone Payor, MD Referring Provider is Jonelle Sidle, MD  Chief Complaint  Patient presents with  . Routine Post Op    3 week f/u with CXR,     HPI: Severe diabetic status post mastectomy for breast cancer, multivessel bypass grafting November 2012 with postoperative superficial sternal wound infection. This required surgical debridement wound VAC and now there is a persistent nonhealing small clean appearing area with exposed bone in the mid sternal region. Consultation with plastics and reconstructive surgery Dr. Kelly Splinter at the wound center has been performed and plan is in progress for operative debridement and possible topical therapy with a-cell. Currently the patient is applying a silver impregnated gauze dressing once daily with minimal drainage on the silver pad after 24 hours. No systemic symptoms no surrounding erythema or cellulitis. Patient currently is off antibiotics. Wound culture previously has grown MRSA.  Patient presents for routine followup and to check in with the status of plan therapy to the wound center. She denies angina. She's is mildly dyspneic with exertion. She has no fluid retention.  Past Medical History  Diagnosis Date  . Coronary atherosclerosis of native coronary artery     BMS circ 2001, DES LAD 2005, DES LAD and RCA 2008, subsequent CABG  . Drug allergy     Allergy to Plavix (hives) - took Ticlid in past  . Essential hypertension, benign   . Mixed hyperlipidemia   . Type 2 diabetes mellitus   . Myocardial infarction ~ 2002  . Arthritis     rheumatoid  . Depression   . Peripheral neuropathy   . Peptic ulcer disease ~ 1975  . Nephrolithiasis 02/07/2011  . Angina   . Breast cancer     Chemo and mastectomy- right  . Diabetes mellitus   . Chronic systolic heart failure     LVEF 40-45%  . Anxiety   . Sternal wound infection     CABG wound  . Stroke 07/2011    Past Surgical History  Procedure Date  . Mastectomy 1988    right  .  Cholecystectomy 1969  . Abdominal hysterectomy 1989  . Tonsillectomy and adenoidectomy 1979  . Appendectomy 1969  . Tubal ligation 1972  . Coronary artery bypass graft 01/11/2011    Procedure: CORONARY ARTERY BYPASS GRAFTING (CABG);  Surgeon: Kathlee Nations Suann Larry, MD;  Location: [facility] OR;  Service: Open Heart Surgery;  Laterality: N/A;  cabg x four, using left internal mammary artery and right leg greater saphenous vein harvestede endoscopically  . Sternal wound debridement 03/01/2011    Procedure: STERNAL WOUND DEBRIDEMENT;  Surgeon: Kathlee Nations Suann Larry, MD;  Location: [facility] OR;  Service: Open Heart Surgery;  Laterality: N/A;  . Application of wound vac 03/01/2011    Procedure: APPLICATION OF WOUND VAC;  Surgeon: Kathlee Nations Suann Larry, MD;  Location: [facility] OR;  Service: Open Heart Surgery;  Laterality: N/A;  . Breast surgery   . Sternal wires removal 04/02/2011    Procedure: STERNAL WIRES REMOVAL;  Surgeon: Kathlee Nations Suann Larry, MD;  Location: [facility] OR;  Service: Open Heart Surgery;  Laterality: N/A;    Family History  Problem Relation Age of Onset  . Anesthesia problems Neg Hx     Social History History  Substance Use Topics  . Smoking status: Former Smoker -- 1.0 packs/day for 20 years    Types: Cigarettes    Quit date: 02/27/2007  . Smokeless tobacco: Never Used  . Alcohol Use: No  Current Outpatient Prescriptions  Medication Sig Dispense Refill  . aspirin 325 MG tablet Take 325 mg by mouth daily.       . carvedilol (COREG) 6.25 MG tablet Take 1 tablet (6.25 mg total) by mouth 2 (two) times daily.  60 tablet  3  . erythromycin ophthalmic ointment Place 1 application into the left eye at bedtime.       . furosemide (LASIX) 40 MG tablet Take 40 mg by mouth every other day.      . gabapentin (NEURONTIN) 300 MG capsule Take 300 mg by mouth 3 (three) times daily.       . hydrALAZINE (APRESOLINE) 25 MG tablet Take 0.5 tablets (12.5 mg total) by mouth every 8 (eight) hours.  42 tablet  3  . insulin  glargine (LANTUS) 100 UNIT/ML injection Inject 35 Units into the skin 2 (two) times daily.       . insulin lispro (HUMALOG) 100 UNIT/ML injection Inject 20 Units into the skin 3 (three) times daily before meals.       . isosorbide mononitrate (IMDUR) 30 MG 24 hr tablet Take 1 tablet (30 mg total) by mouth daily.  30 tablet  3  . metFORMIN (GLUCOPHAGE) 500 MG tablet Take 500 mg by mouth 2 (two) times daily with a meal.      . Multiple Vitamin (MULITIVITAMIN WITH MINERALS) TABS Take 1 tablet by mouth daily.      Marland Kitchen PARoxetine (PAXIL) 20 MG tablet Take 20 mg by mouth every morning.       . simvastatin (ZOCOR) 20 MG tablet Take 1 tablet (20 mg total) by mouth at bedtime.  90 tablet  1  . traMADol (ULTRAM) 50 MG tablet Take 50 mg by mouth every 6 (six) hours as needed. For pain      . trimethoprim-polymyxin b (POLYTRIM) ophthalmic solution Place 1 drop into the left eye every 6 (six) hours.         Allergies  Allergen Reactions  . Clopidogrel Bisulfate Hives  . Codeine Nausea And Vomiting  . Morphine Nausea And Vomiting    Review of Systems afebrile, good appetite, no edema  BP 113/63  Pulse 76  Resp 20  Ht 5\' 5"  (1.651 m)  Wt 165 lb (74.844 kg)  BMI 27.46 kg/m2  SpO2 95%  LMP 01/05/1996 Physical Exam Alert and comfortable Breath sounds clear Sternum stable no erythema dressing intact Cardiac rhythm regular Abdomen soft No peripheral edema  Diagnostic Tests: Chest x-ray shows no pleural effusions stable cardiac silhouette  Impression: Nonhealing small area of exposed bone at the lower mid sternal incision with plans for further therapy by Dr. Kelly Splinter at the wound center. Will sign off on regular wound checks at this time and follow patient with Dr. Kelly Splinter who is planning operative debridement in the near future.  Plan: Return for followup after finished with therapy at the wound center.

## 2011-10-23 NOTE — Progress Notes (Signed)
[facility]  NAMEMARYLAND, STELL              ACCOUNT NO.:  [id number]  MEDICAL RECORD NO.:  [id number]      DATE OF BIRTH:  11/10/1950  PHYSICIAN:  Wayland Denis, DO       VISIT DATE:  10/22/2011                                  OFFICE VISIT   The patient is a 61 year old female, who is here for follow up on her chest wound after surgery.  At first she did not want to return to the OR and was nervous about that.  Now she is in agreement to do that.  I do not think that she will heal in a timely fashion without surgery.  One exam, she is alert, oriented, not in any acute distress.  She is pleasant.  Her pupils are equal.  Her extraocular muscles are intact. Her breathing is nonlabored.  At this point, we will continue with collagen and have her follow up in 1-2 weeks, and I will work on getting her to the OR.     Wayland Denis, DO     CS/MEDQ  D:  10/22/2011  T:  10/23/2011  Job:  [id number]

## 2011-10-25 ENCOUNTER — Ambulatory Visit (INDEPENDENT_AMBULATORY_CARE_PROVIDER_SITE_OTHER): Payer: 59 | Admitting: Cardiology

## 2011-10-25 ENCOUNTER — Encounter: Payer: Self-pay | Admitting: Cardiology

## 2011-10-25 VITALS — BP 134/64 | HR 85 | Ht 65.0 in | Wt 169.0 lb

## 2011-10-25 DIAGNOSIS — I2589 Other forms of chronic ischemic heart disease: Secondary | ICD-10-CM

## 2011-10-25 DIAGNOSIS — I2510 Atherosclerotic heart disease of native coronary artery without angina pectoris: Secondary | ICD-10-CM

## 2011-10-25 DIAGNOSIS — I255 Ischemic cardiomyopathy: Secondary | ICD-10-CM

## 2011-10-25 DIAGNOSIS — E785 Hyperlipidemia, unspecified: Secondary | ICD-10-CM

## 2011-10-25 DIAGNOSIS — I10 Essential (primary) hypertension: Secondary | ICD-10-CM

## 2011-10-25 MED ORDER — ISOSORBIDE MONONITRATE ER 30 MG PO TB24: 30.0000 mg | ORAL_TABLET | Freq: Every day | ORAL | Status: DC

## 2011-10-25 MED ORDER — SIMVASTATIN 20 MG PO TABS: 20.0000 mg | ORAL_TABLET | Freq: Every day | ORAL | Status: DC

## 2011-10-25 MED ORDER — CARVEDILOL 6.25 MG PO TABS: 6.2500 mg | ORAL_TABLET | Freq: Two times a day (BID) | ORAL | Status: DC

## 2011-10-25 MED ORDER — FUROSEMIDE 40 MG PO TABS: 40.0000 mg | ORAL_TABLET | ORAL | Status: DC

## 2011-10-25 NOTE — Patient Instructions (Addendum)
Your physician recommends that you schedule a follow-up appointment in: 6 weeks  Your physician recommends that you return for lab work in:  Week of follow up visit, prior to visit.  Lab slip will be faxed to lab.

## 2011-10-25 NOTE — Progress Notes (Signed)
Clinical Summary [name] is a medically complex 61 y.o.female presenting for followup. She was seen in March. Subsequent hospitalization in June noted with acute on chronic systolic CHF in setting of hyperbaric treatment for wound. She had a NSTEMI at that time and cardiac catheterization showed 3/4 grafts patent. LVEF by echocardiogram at that time was 40%. Still also recovering from a stroke. She remains functionally limited, but has been relatively stable, actually trying to keep moving forward and remain positive despite her many setbacks.  She is here with her husband today. She reports no leg edema, no orthopnea. Appetite has been good. She has had no chest pain. I reviewed her medications. She was taken off of hydralazine secondary to allergic reaction with hives. She had hyperkalemia noted during her hospital stay, was not placed on ACE inhibitor or ARB.  Chest ongoing treatment for her sternal wound.   Allergies  Allergen Reactions  . Clopidogrel Bisulfate Hives  . Codeine Nausea And Vomiting  . Hydralazine Hives  . Morphine Nausea And Vomiting    Current Outpatient Prescriptions  Medication Sig Dispense Refill  . aspirin 325 MG tablet Take 325 mg by mouth daily.       . carvedilol (COREG) 6.25 MG tablet Take 1 tablet (6.25 mg total) by mouth 2 (two) times daily.  180 tablet  3  . erythromycin ophthalmic ointment Place 1 application into the left eye at bedtime.       . furosemide (LASIX) 40 MG tablet Take 1 tablet (40 mg total) by mouth every other day.  45 tablet  3  . gabapentin (NEURONTIN) 300 MG capsule Take 300 mg by mouth 3 (three) times daily.       . insulin glargine (LANTUS) 100 UNIT/ML injection Inject 35 Units into the skin 2 (two) times daily.       . insulin lispro (HUMALOG) 100 UNIT/ML injection Inject 20 Units into the skin 3 (three) times daily before meals.       . isosorbide mononitrate (IMDUR) 30 MG 24 hr tablet Take 1 tablet (30 mg total) by mouth daily.   90 tablet  3  . metFORMIN (GLUCOPHAGE) 500 MG tablet Take 500 mg by mouth 2 (two) times daily with a meal.      . Multiple Vitamin (MULITIVITAMIN WITH MINERALS) TABS Take 1 tablet by mouth daily.      Marland Kitchen PARoxetine (PAXIL) 20 MG tablet Take 20 mg by mouth every morning.       . simvastatin (ZOCOR) 20 MG tablet Take 1 tablet (20 mg total) by mouth at bedtime.  90 tablet  3  . traMADol (ULTRAM) 50 MG tablet Take 50 mg by mouth every 6 (six) hours as needed. For pain      . trimethoprim-polymyxin b (POLYTRIM) ophthalmic solution Place 1 drop into the left eye every 6 (six) hours.       Marland Kitchen DISCONTD: carvedilol (COREG) 6.25 MG tablet Take 1 tablet (6.25 mg total) by mouth 2 (two) times daily.  60 tablet  3  . DISCONTD: furosemide (LASIX) 40 MG tablet Take 40 mg by mouth every other day.      Marland Kitchen DISCONTD: isosorbide mononitrate (IMDUR) 30 MG 24 hr tablet Take 1 tablet (30 mg total) by mouth daily.  30 tablet  3  . DISCONTD: simvastatin (ZOCOR) 20 MG tablet Take 1 tablet (20 mg total) by mouth at bedtime.  90 tablet  1    Past Medical History  Diagnosis Date  .  Coronary atherosclerosis of native coronary artery     BMS circ 2001, DES LAD 2005, DES LAD and RCA 2008, subsequent CABG  . Drug allergy     Allergy to Plavix (hives) - took Ticlid in past  . Essential hypertension, benign   . Mixed hyperlipidemia   . Type 2 diabetes mellitus   . Myocardial infarction ~ 2002  . Arthritis     rheumatoid  . Depression   . Peripheral neuropathy   . Peptic ulcer disease ~ 1975  . Nephrolithiasis 02/07/2011  . Angina   . Breast cancer     Chemo and mastectomy- right  . Diabetes mellitus   . Chronic systolic heart failure     LVEF 40-45%  . Anxiety   . Sternal wound infection     CABG wound  . Stroke 07/2011    Past Surgical History  Procedure Date  . Mastectomy 1988    right  . Cholecystectomy 1969  . Abdominal hysterectomy 1989  . Tonsillectomy and adenoidectomy 1979  . Appendectomy 1969  .  Tubal ligation 1972  . Coronary artery bypass graft 01/11/2011    Procedure: CORONARY ARTERY BYPASS GRAFTING (CABG);  Surgeon: Kathlee Nations Suann Larry, MD;  Location: [facility] OR;  Service: Open Heart Surgery;  Laterality: N/A;  cabg x four, using left internal mammary artery and right leg greater saphenous vein harvestede endoscopically  . Sternal wound debridement 03/01/2011    Procedure: STERNAL WOUND DEBRIDEMENT;  Surgeon: Kathlee Nations Suann Larry, MD;  Location: [facility] OR;  Service: Open Heart Surgery;  Laterality: N/A;  . Application of wound vac 03/01/2011    Procedure: APPLICATION OF WOUND VAC;  Surgeon: Kathlee Nations Suann Larry, MD;  Location: [facility] OR;  Service: Open Heart Surgery;  Laterality: N/A;  . Breast surgery   . Sternal wires removal 04/02/2011    Procedure: STERNAL WIRES REMOVAL;  Surgeon: Kathlee Nations Suann Larry, MD;  Location: [facility] OR;  Service: Open Heart Surgery;  Laterality: N/A;    Family History  Problem Relation Age of Onset  . Anesthesia problems Neg Hx     Social History [name] reports that she quit smoking about 4 years ago. Her smoking use included Cigarettes. She has a 20 pack-year smoking history. She has never used smokeless tobacco. [name] reports that she does not drink alcohol.  Review of Systems No palpitations or syncope. No falls. No bleeding episodes. Otherwise as outlined above.  Physical Examination Filed Vitals:   10/25/11 1344  BP: 134/64  Pulse: 85   Normally nourished appearing in NAD.  HEENT: Conjunctiva and lids normal, oropharynx clear with moist mucosa.  Neck: Supple, no elevated JVP or carotid bruits, no thyromegaly.  Lungs: Clear to auscultation, diminished at bases, nonlabored breathing at rest.  Cardiac: Regular rate and rhythm, no S3 or pericardial rub.  Thorax: Dressing lower sternum intact.  Abdomen: Nontender, bowel sounds present, no guarding or rebound.  Extremities: No pitting edema, distal pulses1+.  Skin: Warm and dry.  Musculoskeletal: No  kyphosis.  Neuropsychiatric: Alert and oriented x3, affect grossly appropriate.   Problem List and Plan   Coronary atherosclerosis of native coronary artery Symptomatically stable without angina. Continue medical therapy and observation.  Ischemic cardiomyopathy Weight is up 4 pounds over the last few months, no edema however, seems to be relatively stable from a volume perspective. Medications reviewed. Hydralazine discontinued due to hives. We may ultimately consider trying a low dose ACE inhibitor or ARB, but I would like for  her to be more clinically stable first. She did have hyperkalemia noted back in June, although not on specific medical therapy. Would tend to avoid Spironolactone however.  Essential hypertension, benign Blood pressure stable, in fact mildly increased today. Has been on the lower side in the past. I asked them to keep a intermittent log of blood pressure at home.  HYPERLIPIDEMIA-MIXED She continues on statin therapy.    Jonelle Sidle, M.D., F.A.C.C.

## 2011-10-25 NOTE — Assessment & Plan Note (Signed)
Blood pressure stable, in fact mildly increased today. Has been on the lower side in the past. I asked them to keep a intermittent log of blood pressure at home.

## 2011-10-25 NOTE — Assessment & Plan Note (Signed)
She continues on statin therapy. 

## 2011-10-25 NOTE — Assessment & Plan Note (Signed)
Symptomatically stable without angina. Continue medical therapy and observation.

## 2011-10-25 NOTE — Assessment & Plan Note (Signed)
Weight is up 4 pounds over the last few months, no edema however, seems to be relatively stable from a volume perspective. Medications reviewed. Hydralazine discontinued due to hives. We may ultimately consider trying a low dose ACE inhibitor or ARB, but I would like for her to be more clinically stable first. She did have hyperkalemia noted back in June, although not on specific medical therapy. Would tend to avoid Spironolactone however.

## 2011-11-05 ENCOUNTER — Encounter (HOSPITAL_BASED_OUTPATIENT_CLINIC_OR_DEPARTMENT_OTHER): Payer: 59

## 2011-11-12 ENCOUNTER — Encounter (HOSPITAL_BASED_OUTPATIENT_CLINIC_OR_DEPARTMENT_OTHER): Payer: 59 | Attending: Plastic Surgery

## 2011-11-12 DIAGNOSIS — L98499 Non-pressure chronic ulcer of skin of other sites with unspecified severity: Secondary | ICD-10-CM | POA: Insufficient documentation

## 2011-11-13 NOTE — Progress Notes (Signed)
[facility]  NAME:  Davidian, Rehema                   ACCOUNT NO.:  MEDICAL RECORD NO.:  [id number]      DATE OF BIRTH:  [birth year]  PHYSICIAN:  Wayland Denis, DO       VISIT DATE:  11/12/2011                                  OFFICE VISIT   The patient is a 61 year old white female who is here for followup on her sternal ulcer.  She has been using combination of collagen and Hydrogel on the area with some improvement.  The area is actually filling in some, but it is small and deep and there is concern that if we let this go, she may end up with an infection of the sternal area. So, we are planning on taking her to the operating room for irrigation, debridement and placement of A-Cell.  There has been no change in her medications or social history.  On exam, she is alert.  She is oriented, cooperative, not in any acute distress.  She is pleasant.  Pupils are equal.  Extraocular muscles are intact.  She does not have any cervical lymphadenopathy.  There is no sign of infection.  The areas are not red and not draining.  We will continue with collagen, Hydrogel, and we will have her follow up in a week for surgery.     Wayland Denis, DO     CS/MEDQ  D:  11/12/2011  T:  11/13/2011  Job:  [id number]

## 2011-11-19 ENCOUNTER — Encounter (HOSPITAL_BASED_OUTPATIENT_CLINIC_OR_DEPARTMENT_OTHER): Admission: RE | Payer: Self-pay | Source: Ambulatory Visit

## 2011-11-19 ENCOUNTER — Ambulatory Visit (HOSPITAL_BASED_OUTPATIENT_CLINIC_OR_DEPARTMENT_OTHER): Admission: RE | Admit: 2011-11-19 | Payer: 59 | Source: Ambulatory Visit | Admitting: Plastic Surgery

## 2011-11-19 SURGERY — IRRIGATION AND DEBRIDEMENT WOUND
Anesthesia: General

## 2011-11-26 ENCOUNTER — Encounter (HOSPITAL_COMMUNITY): Payer: Self-pay | Admitting: Pharmacist

## 2011-11-27 ENCOUNTER — Other Ambulatory Visit: Payer: Self-pay | Admitting: Plastic Surgery

## 2011-11-27 DIAGNOSIS — L98499 Non-pressure chronic ulcer of skin of other sites with unspecified severity: Secondary | ICD-10-CM

## 2011-11-29 ENCOUNTER — Encounter (HOSPITAL_COMMUNITY): Payer: Self-pay

## 2011-11-29 ENCOUNTER — Encounter (HOSPITAL_COMMUNITY)
Admission: RE | Admit: 2011-11-29 | Discharge: 2011-11-29 | Disposition: A | Discharge status: Home / Self Care | Payer: 59 | Source: Ambulatory Visit | Attending: Plastic Surgery | Admitting: Plastic Surgery

## 2011-11-29 VITALS — BP 142/72 | HR 68 | Temp 98.1°F | Resp 20 | Ht 65.0 in | Wt 167.9 lb

## 2011-11-29 DIAGNOSIS — L98499 Non-pressure chronic ulcer of skin of other sites with unspecified severity: Secondary | ICD-10-CM

## 2011-11-29 LAB — SURGICAL PCR SCREEN
MRSA, PCR: NEGATIVE
Staphylococcus aureus: NEGATIVE

## 2011-11-29 LAB — CBC
HCT: 37.5 % (ref 36.0–46.0)
Hemoglobin: 12.7 g/dL (ref 12.0–15.0)
MCHC: 33.9 g/dL (ref 30.0–36.0)
RBC: 4.49 MIL/uL (ref 3.87–5.11)

## 2011-11-29 LAB — BASIC METABOLIC PANEL
BUN: 27 mg/dL — ABNORMAL HIGH (ref 6–23)
Chloride: 103 mEq/L (ref 96–112)
GFR calc Af Amer: 85 mL/min — ABNORMAL LOW (ref >90)
Glucose, Bld: 115 mg/dL — ABNORMAL HIGH (ref 70–99)
Potassium: 4.6 mEq/L (ref 3.5–5.1)
Sodium: 140 mEq/L (ref 135–145)

## 2011-11-29 NOTE — Progress Notes (Signed)
Discussed CXR 06/2011 and most recent cardiac note from Dr. Diona Browner with [facility].  No orders given

## 2011-11-29 NOTE — Pre-Procedure Instructions (Signed)
20 THYRA ARMOUR  11/29/2011   Your procedure is scheduled on:  Thursday December 06, 2011  Report to [facility] at 8:45 AM.  Call this number if you have problems the morning of surgery: [phone]   Remember:   Do not eat food or drink :After Midnight.      Take these medicines the morning of surgery with A SIP OF WATER: carvedilol, gabapentin, isosorbide, paxil   Do not wear jewelry, make-up or nail polish.  Do not wear lotions, powders, or perfumes. You may wear deodorant.  Do not shave 48 hours prior to surgery. Men may shave face and neck.  Do not bring valuables to the hospital.  Contacts, dentures or bridgework may not be worn into surgery.  Leave suitcase in the car. After surgery it may be brought to your room.  For patients admitted to the hospital, checkout time is 11:00 AM the day of discharge.   Patients discharged the day of surgery will not be allowed to drive home.  Name and phone number of your driver: family / friend  Special Instructions: Shower using CHG 2 nights before surgery and the night before surgery.  If you shower the day of surgery use CHG.  Use special wash - you have one bottle of CHG for all showers.  You should use approximately 1/3 of the bottle for each shower.   Please read over the following fact sheets that you were given: Pain Booklet, Coughing and Deep Breathing, MRSA Information and Surgical Site Infection Prevention

## 2011-11-30 ENCOUNTER — Other Ambulatory Visit: Payer: Self-pay | Admitting: *Deleted

## 2011-11-30 DIAGNOSIS — I10 Essential (primary) hypertension: Secondary | ICD-10-CM

## 2011-12-05 ENCOUNTER — Other Ambulatory Visit: Payer: Self-pay | Admitting: Endocrinology

## 2011-12-05 MED ORDER — CEFAZOLIN SODIUM-DEXTROSE 2-3 GM-% IV SOLR
2.0000 g | INTRAVENOUS | Status: AC
  Administered 2011-12-06: 2 g via INTRAVENOUS
  Filled 2011-12-05: qty 50

## 2011-12-05 NOTE — Telephone Encounter (Signed)
This pt called for a refill of her Lantus, but I wasn't sure if we could refill it because it looks like it was originally filled by another provider.Marland KitchenMarland Kitchen

## 2011-12-06 ENCOUNTER — Encounter (HOSPITAL_COMMUNITY): Payer: Self-pay | Admitting: Certified Registered Nurse Anesthetist

## 2011-12-06 ENCOUNTER — Encounter (HOSPITAL_COMMUNITY): Payer: Self-pay | Admitting: Plastic Surgery

## 2011-12-06 ENCOUNTER — Ambulatory Visit (HOSPITAL_COMMUNITY)
Admission: RE | Admit: 2011-12-06 | Discharge: 2011-12-06 | Disposition: A | Discharge status: Home / Self Care | Payer: 59 | Source: Ambulatory Visit | Attending: Plastic Surgery | Admitting: Plastic Surgery

## 2011-12-06 ENCOUNTER — Ambulatory Visit: Payer: 59 | Admitting: Cardiology

## 2011-12-06 ENCOUNTER — Other Ambulatory Visit: Payer: Self-pay | Admitting: General Practice

## 2011-12-06 ENCOUNTER — Encounter (HOSPITAL_COMMUNITY)
Admission: RE | Disposition: A | Discharge status: Home / Self Care | Payer: Self-pay | Source: Ambulatory Visit | Attending: Plastic Surgery

## 2011-12-06 ENCOUNTER — Encounter (HOSPITAL_COMMUNITY): Payer: Self-pay | Admitting: *Deleted

## 2011-12-06 ENCOUNTER — Ambulatory Visit (HOSPITAL_COMMUNITY): Payer: 59 | Admitting: Certified Registered Nurse Anesthetist

## 2011-12-06 DIAGNOSIS — E119 Type 2 diabetes mellitus without complications: Secondary | ICD-10-CM | POA: Insufficient documentation

## 2011-12-06 DIAGNOSIS — Z951 Presence of aortocoronary bypass graft: Secondary | ICD-10-CM | POA: Insufficient documentation

## 2011-12-06 DIAGNOSIS — I10 Essential (primary) hypertension: Secondary | ICD-10-CM | POA: Insufficient documentation

## 2011-12-06 DIAGNOSIS — Z01812 Encounter for preprocedural laboratory examination: Secondary | ICD-10-CM | POA: Insufficient documentation

## 2011-12-06 DIAGNOSIS — L98499 Non-pressure chronic ulcer of skin of other sites with unspecified severity: Secondary | ICD-10-CM | POA: Insufficient documentation

## 2011-12-06 HISTORY — PX: INCISION AND DRAINAGE OF WOUND: SHX1803

## 2011-12-06 LAB — GLUCOSE, CAPILLARY

## 2011-12-06 SURGERY — IRRIGATION AND DEBRIDEMENT WOUND
Anesthesia: General | Site: Chest | Wound class: Dirty or Infected

## 2011-12-06 MED ORDER — FENTANYL CITRATE 0.05 MG/ML IJ SOLN
INTRAMUSCULAR | Status: DC | PRN
  Administered 2011-12-06: 100 ug via INTRAVENOUS

## 2011-12-06 MED ORDER — MEPERIDINE HCL 25 MG/ML IJ SOLN: 6.2500 mg | INTRAMUSCULAR | Status: DC | PRN

## 2011-12-06 MED ORDER — MIDAZOLAM HCL 5 MG/5ML IJ SOLN
INTRAMUSCULAR | Status: DC | PRN
  Administered 2011-12-06: 2 mg via INTRAVENOUS

## 2011-12-06 MED ORDER — OXYCODONE HCL 5 MG/5ML PO SOLN: 5.0000 mg | Freq: Once | ORAL | Status: DC | PRN

## 2011-12-06 MED ORDER — HYDROMORPHONE HCL PF 1 MG/ML IJ SOLN
INTRAMUSCULAR | Status: AC
  Filled 2011-12-06: qty 1

## 2011-12-06 MED ORDER — SODIUM CHLORIDE 0.9 % IR SOLN
Status: DC | PRN
  Administered 2011-12-06: 1

## 2011-12-06 MED ORDER — PROMETHAZINE HCL 25 MG/ML IJ SOLN: 6.2500 mg | INTRAMUSCULAR | Status: DC | PRN

## 2011-12-06 MED ORDER — LIDOCAINE HCL (CARDIAC) 20 MG/ML IV SOLN
INTRAVENOUS | Status: DC | PRN
  Administered 2011-12-06: 50 mg via INTRAVENOUS

## 2011-12-06 MED ORDER — HYDROMORPHONE HCL PF 1 MG/ML IJ SOLN
0.2500 mg | INTRAMUSCULAR | Status: DC | PRN
  Administered 2011-12-06 (×2): 0.5 mg via INTRAVENOUS

## 2011-12-06 MED ORDER — LACTATED RINGERS IV SOLN
INTRAVENOUS | Status: DC | PRN
  Administered 2011-12-06: 1000 mL

## 2011-12-06 MED ORDER — ONDANSETRON HCL 4 MG/2ML IJ SOLN
INTRAMUSCULAR | Status: DC | PRN
  Administered 2011-12-06: 4 mg via INTRAVENOUS

## 2011-12-06 MED ORDER — SODIUM CHLORIDE 0.9 % IR SOLN
Status: DC | PRN
  Administered 2011-12-06: 13:00:00

## 2011-12-06 MED ORDER — PROPOFOL 10 MG/ML IV BOLUS
INTRAVENOUS | Status: DC | PRN
  Administered 2011-12-06: 140 mg via INTRAVENOUS

## 2011-12-06 MED ORDER — EPHEDRINE SULFATE 50 MG/ML IJ SOLN
INTRAMUSCULAR | Status: DC | PRN
  Administered 2011-12-06 (×3): 10 mg via INTRAVENOUS

## 2011-12-06 MED ORDER — INSULIN GLARGINE 100 UNIT/ML ~~LOC~~ SOLN: 40.0000 [IU] | Freq: Two times a day (BID) | SUBCUTANEOUS | Status: DC

## 2011-12-06 MED ORDER — PHENYLEPHRINE HCL 10 MG/ML IJ SOLN
INTRAMUSCULAR | Status: DC | PRN
  Administered 2011-12-06: 120 ug via INTRAVENOUS
  Administered 2011-12-06 (×3): 80 ug via INTRAVENOUS

## 2011-12-06 MED ORDER — SUCCINYLCHOLINE CHLORIDE 20 MG/ML IJ SOLN
INTRAMUSCULAR | Status: DC | PRN
  Administered 2011-12-06: 100 mg via INTRAVENOUS

## 2011-12-06 MED ORDER — OXYCODONE HCL 5 MG PO TABS: 5.0000 mg | ORAL_TABLET | Freq: Once | ORAL | Status: DC | PRN

## 2011-12-06 SURGICAL SUPPLY — 38 items
APPLICATOR COTTON TIP 6IN STRL (MISCELLANEOUS) ×2 IMPLANT
BANDAGE GAUZE ELAST BULKY 4 IN (GAUZE/BANDAGES/DRESSINGS) IMPLANT
BINDER BREAST XLRG (GAUZE/BANDAGES/DRESSINGS) ×2 IMPLANT
BLADE SURG ROTATE 9660 (MISCELLANEOUS) IMPLANT
CANISTER SUCTION 2500CC (MISCELLANEOUS) ×2 IMPLANT
CATH URET WHISTLE 8FR 331008 (CATHETERS) ×2 IMPLANT
CHLORAPREP W/TINT 26ML (MISCELLANEOUS) IMPLANT
CLOTH BEACON ORANGE TIMEOUT ST (SAFETY) ×2 IMPLANT
COVER SURGICAL LIGHT HANDLE (MISCELLANEOUS) ×2 IMPLANT
DRAPE PED LAPAROTOMY (DRAPES) ×2 IMPLANT
DRAPE PROXIMA HALF (DRAPES) IMPLANT
DRSG ADAPTIC 3X8 NADH LF (GAUZE/BANDAGES/DRESSINGS) ×2 IMPLANT
DRSG PAD ABDOMINAL 8X10 ST (GAUZE/BANDAGES/DRESSINGS) IMPLANT
DRSG VAC ATS SM SENSATRAC (GAUZE/BANDAGES/DRESSINGS) ×2 IMPLANT
ELECT CAUTERY BLADE 6.4 (BLADE) ×2 IMPLANT
ELECT REM PT RETURN 9FT ADLT (ELECTROSURGICAL) ×2
ELECTRODE REM PT RTRN 9FT ADLT (ELECTROSURGICAL) ×1 IMPLANT
GLOVE BIO SURGEON STRL SZ 6.5 (GLOVE) ×4 IMPLANT
GLOVE BIOGEL PI IND STRL 7.0 (GLOVE) ×2 IMPLANT
GLOVE BIOGEL PI INDICATOR 7.0 (GLOVE) ×2
GLOVE ECLIPSE 6.5 STRL STRAW (GLOVE) ×2 IMPLANT
GOWN STRL NON-REIN LRG LVL3 (GOWN DISPOSABLE) ×6 IMPLANT
HANDPIECE INTERPULSE COAX TIP (DISPOSABLE)
KIT BASIN OR (CUSTOM PROCEDURE TRAY) ×2 IMPLANT
KIT ROOM TURNOVER OR (KITS) ×2 IMPLANT
MICROMATRIX 500MG (Tissue) ×2 IMPLANT
NS IRRIG 1000ML POUR BTL (IV SOLUTION) ×2 IMPLANT
PACK GENERAL/GYN (CUSTOM PROCEDURE TRAY) ×2 IMPLANT
PAD ARMBOARD 7.5X6 YLW CONV (MISCELLANEOUS) ×2 IMPLANT
SET HNDPC FAN SPRY TIP SCT (DISPOSABLE) IMPLANT
SOLUTION PARTIC MCRMTRX 500MG (Tissue) ×1 IMPLANT
SPONGE GAUZE 4X4 12PLY (GAUZE/BANDAGES/DRESSINGS) IMPLANT
SWAB COLLECTION DEVICE MRSA (MISCELLANEOUS) IMPLANT
TOWEL OR 17X24 6PK STRL BLUE (TOWEL DISPOSABLE) ×2 IMPLANT
TOWEL OR 17X26 10 PK STRL BLUE (TOWEL DISPOSABLE) ×2 IMPLANT
TUBE ANAEROBIC SPECIMEN COL (MISCELLANEOUS) IMPLANT
TUBE CONNECTING 12X1/4 (SUCTIONS) ×2 IMPLANT
UNDERPAD 30X30 INCONTINENT (UNDERPADS AND DIAPERS) IMPLANT

## 2011-12-06 NOTE — Progress Notes (Signed)
Patient is sitting in recliner and emesis noted. Afterwards patient stated "I feel better." Shortly thereafter tech assisted patient to bathroom and emesis was noted again. Patient stated "I think I need to stay the night." Nurse informed patient that Dr Kelly Splinter would be notified of this. Patient came out of bathroom and stated "I feel better now and I want to go home." Will call Dr. Kelly Splinter and notify her of this.

## 2011-12-06 NOTE — Anesthesia Postprocedure Evaluation (Signed)
  Anesthesia Post-op Note  Patient: [name]  Procedure(s) Performed: Procedure(s) (LRB) with comments: IRRIGATION AND DEBRIDEMENT WOUND (N/A) - IRRIGATION AND DEBRIDEMENT OF STERNAL ULCER AND PLACEMENT OF ACELL  APPLICATION OF A-CELL OF CHEST/ABDOMEN (N/A)  Patient Location: PACU  Anesthesia Type: General  Level of Consciousness: awake  Airway and Oxygen Therapy: Patient Spontanous Breathing  Post-op Pain: mild  Post-op Assessment: Post-op Vital signs reviewed  Post-op Vital Signs: stable  Complications: No apparent anesthesia complications

## 2011-12-06 NOTE — OR Nursing (Signed)
Three microbiology specimens tubed by Verdie Mosher, RN. He called to verify tubing and spoke to [place].  Oralia Manis, RN.

## 2011-12-06 NOTE — Brief Op Note (Signed)
12/06/2011  1:09 PM  PATIENT:  [name]  61 y.o. female  PRE-OPERATIVE DIAGNOSIS:  Sternal Ulcer  POST-OPERATIVE DIAGNOSIS:  Sternal Ulcer  PROCEDURE:  Procedure(s) (LRB) with comments: IRRIGATION AND DEBRIDEMENT WOUND (N/A) - IRRIGATION AND DEBRIDEMENT OF STERNAL ULCER AND PLACEMENT OF ACELL  APPLICATION OF A-CELL OF CHEST/ABDOMEN (N/A) -  VAC placement  SURGEON:  Surgeon(s) and Role:    * Betzaida Cremeens Sanger, DO - Primary  PHYSICIAN ASSISTANT: Shawn Rayburn, PA  ASSISTANTS: none   ANESTHESIA:   general  EBL:     BLOOD ADMINISTERED:none  DRAINS: none   LOCAL MEDICATIONS USED:  NONE  SPECIMEN:  Source of Specimen:  sternal bone  DISPOSITION OF SPECIMEN:  micro  COUNTS:  YES  TOURNIQUET:  * No tourniquets in log *  DICTATION: dictated  PLAN OF CARE: Discharge to home after PACU  PATIENT DISPOSITION:  PACU - hemodynamically stable.   Delay start of Pharmacological VTE agent (>24hrs) due to surgical blood loss or risk of bleeding: no

## 2011-12-06 NOTE — Progress Notes (Signed)
Nurse tried to call Dr. Kelly Splinter and left VM requesting her to return call.

## 2011-12-06 NOTE — Preoperative (Signed)
Beta Blockers   Reason not to administer Beta Blockers:Not Applicable 

## 2011-12-06 NOTE — Transfer of Care (Signed)
Immediate Anesthesia Transfer of Care Note  Patient: [name]  Procedure(s) Performed: Procedure(s) (LRB) with comments: IRRIGATION AND DEBRIDEMENT WOUND (N/A) - IRRIGATION AND DEBRIDEMENT OF STERNAL ULCER AND PLACEMENT OF ACELL  APPLICATION OF A-CELL OF CHEST/ABDOMEN (N/A)  Patient Location: PACU  Anesthesia Type: General  Level of Consciousness: awake, alert  and oriented  Airway & Oxygen Therapy: Patient Spontanous Breathing and Patient connected to face mask oxygen  Post-op Assessment: Report given to PACU RN, Post -op Vital signs reviewed and stable and Patient moving all extremities X 4  Post vital signs: Reviewed and stable  Complications: No apparent anesthesia complications

## 2011-12-06 NOTE — Anesthesia Preprocedure Evaluation (Addendum)
Anesthesia Evaluation  Patient identified by MRN, date of birth, ID band Patient awake    Reviewed: Allergy & Precautions, H&P , NPO status , Patient's Chart, lab work & pertinent test results  History of Anesthesia Complications Negative for: history of anesthetic complications  Airway Mallampati: II  Neck ROM: Full    Dental  (+) Partial Lower   Pulmonary shortness of breath,  breath sounds clear to auscultation        Cardiovascular hypertension, + angina + CAD, + Past MI and +CHF Rhythm:Regular Rate:Normal     Neuro/Psych  Neuromuscular disease CVA    GI/Hepatic PUD,   Endo/Other  diabetes  Renal/GU Renal disease     Musculoskeletal   Abdominal   Peds  Hematology   Anesthesia Other Findings   Reproductive/Obstetrics                          Anesthesia Physical Anesthesia Plan  ASA: III  Anesthesia Plan: General   Post-op Pain Management:    Induction: Intravenous  Airway Management Planned: Oral ETT  Additional Equipment:   Intra-op Plan:   Post-operative Plan: Extubation in OR  Informed Consent: I have reviewed the patients History and Physical, chart, labs and discussed the procedure including the risks, benefits and alternatives for the proposed anesthesia with the patient or authorized representative who has indicated his/her understanding and acceptance.   Dental advisory given  Plan Discussed with: CRNA and Surgeon  Anesthesia Plan Comments:         Anesthesia Quick Evaluation

## 2011-12-06 NOTE — Telephone Encounter (Signed)
i refilled x 1 Ov is due 

## 2011-12-06 NOTE — Progress Notes (Signed)
Dr. Kelly Splinter has not returned call. Nurse checked O2 sats and they went up to 94% but declined to 87% on room air but is asymptomatic. Patient stated she is ready to go home. Husband at chair side. Patient preparing for discharge.

## 2011-12-06 NOTE — Anesthesia Procedure Notes (Signed)
Procedure Name: Intubation Date/Time: 12/06/2011 12:22 PM Performed by: Sharlene Dory E Pre-anesthesia Checklist: Patient identified, Emergency Drugs available, Suction available, Patient being monitored and Timeout performed Patient Re-evaluated:Patient Re-evaluated prior to inductionOxygen Delivery Method: Circle system utilized Preoxygenation: Pre-oxygenation with 100% oxygen Intubation Type: IV induction Ventilation: Mask ventilation without difficulty and Oral airway inserted - appropriate to patient size Laryngoscope Size: Mac and 3 Grade View: Grade I Tube type: Oral Tube size: 7.0 mm Number of attempts: 1 Airway Equipment and Method: Stylet Placement Confirmation: ETT inserted through vocal cords under direct vision,  positive ETCO2 and breath sounds checked- equal and bilateral Secured at: 20 cm Tube secured with: Tape Dental Injury: Teeth and Oropharynx as per pre-operative assessment

## 2011-12-06 NOTE — H&P (Signed)
[name] is an 61 y.o. female.   Chief Complaint: Chest ulcer HPI: The patient is a 61 yrs old wf here for treatment of her chest ulcer.  She underwent cardiac surgery and had an opening in the lower portion of the sternum.  We have been treating her with local dressings and have now brought her to the OR for irrigation and debridement. The area is 3mm in size.  Past Medical History  Diagnosis Date  . Coronary atherosclerosis of native coronary artery     BMS circ 2001, DES LAD 2005, DES LAD and RCA 2008, subsequent CABG  . Drug allergy     Allergy to Plavix (hives) - took Ticlid in past  . Mixed hyperlipidemia   . Type 2 diabetes mellitus   . Arthritis     rheumatoid  . Depression   . Peripheral neuropathy   . Peptic ulcer disease ~ 1975  . Nephrolithiasis 02/07/2011  . Angina   . Breast cancer     Chemo and mastectomy- right  . Diabetes mellitus   . Chronic systolic heart failure     LVEF 40-45%  . Anxiety   . Sternal wound infection     CABG wound  . Stroke 07/2011  . Bronchitis     hx of   . CHF (congestive heart failure)     hx of Dec 2012  . Myocardial infarction ~ 2002    sees Dr. Diona Browner, saw last Aug 2013  . Essential hypertension, benign     sees Dr. Toni Amend, in [place]    Past Surgical History  Procedure Date  . Mastectomy 1988    right  . Cholecystectomy 1969  . Abdominal hysterectomy 1989  . Tonsillectomy and adenoidectomy 1979  . Appendectomy 1969  . Tubal ligation 1972  . Coronary artery bypass graft 01/11/2011    Procedure: CORONARY ARTERY BYPASS GRAFTING (CABG);  Surgeon: Kathlee Nations Suann Larry, MD;  Location: [facility] OR;  Service: Open Heart Surgery;  Laterality: N/A;  cabg x four, using left internal mammary artery and right leg greater saphenous vein harvestede endoscopically  . Sternal wound debridement 03/01/2011    Procedure: STERNAL WOUND DEBRIDEMENT;  Surgeon: Kathlee Nations Suann Larry, MD;  Location: [facility] OR;  Service: Open Heart Surgery;   Laterality: N/A;  . Application of wound vac 03/01/2011    Procedure: APPLICATION OF WOUND VAC;  Surgeon: Kathlee Nations Suann Larry, MD;  Location: [facility] OR;  Service: Open Heart Surgery;  Laterality: N/A;  . Breast surgery   . Sternal wires removal 04/02/2011    Procedure: STERNAL WIRES REMOVAL;  Surgeon: Kathlee Nations Suann Larry, MD;  Location: [facility] OR;  Service: Open Heart Surgery;  Laterality: N/A;    Family History  Problem Relation Age of Onset  . Anesthesia problems Neg Hx    Social History:  reports that she quit smoking about 4 years ago. Her smoking use included Cigarettes. She has a 20 pack-year smoking history. She has never used smokeless tobacco. She reports that she does not drink alcohol or use illicit drugs.  Allergies:  Allergies  Allergen Reactions  . Clopidogrel Bisulfate Hives  . Codeine Nausea And Vomiting  . Hydralazine Hives  . Morphine Nausea And Vomiting    Medications Prior to Admission  Medication Sig Dispense Refill  . aspirin 325 MG EC tablet Take 325 mg by mouth daily.      . carvedilol (COREG) 6.25 MG tablet Take 1 tablet (6.25 mg total) by mouth  2 (two) times daily.  180 tablet  3  . furosemide (LASIX) 40 MG tablet Take 40 mg by mouth every other day.      . gabapentin (NEURONTIN) 300 MG capsule Take 300 mg by mouth 3 (three) times daily.       . insulin lispro (HUMALOG) 100 UNIT/ML injection Inject 20 Units into the skin 3 (three) times daily before meals.       . isosorbide mononitrate (IMDUR) 30 MG 24 hr tablet Take 30 mg by mouth daily.      . metFORMIN (GLUCOPHAGE) 500 MG tablet Take 500 mg by mouth 2 (two) times daily with a meal.      . Multiple Vitamin (MULITIVITAMIN WITH MINERALS) TABS Take 1 tablet by mouth daily.      Marland Kitchen PARoxetine (PAXIL) 20 MG tablet Take 20 mg by mouth daily.       . simvastatin (ZOCOR) 20 MG tablet Take 20 mg by mouth at bedtime.      . traMADol (ULTRAM) 50 MG tablet Take 50 mg by mouth every 6 (six) hours as needed.      Marland Kitchen trifluridine  (VIROPTIC) 1 % ophthalmic solution 1 drop every 2 (two) hours.        Results for orders placed during the hospital encounter of 12/06/11 (from the past 48 hour(s))  GLUCOSE, CAPILLARY     Status: Abnormal   Collection Time   12/06/11  8:16 AM      Component Value Range Comment   Glucose-Capillary 174 (*) 70 - 99 mg/dL    No results found.  Review of Systems  Constitutional: Negative.   HENT: Negative.   Eyes: Negative.   Respiratory: Negative.   Cardiovascular: Negative.   Gastrointestinal: Negative.   Genitourinary: Negative.   Musculoskeletal: Negative.   Skin: Negative.   Psychiatric/Behavioral: Negative.     Blood pressure 150/66, pulse 63, temperature 97.8 F (36.6 C), temperature source Oral, resp. rate 18, last menstrual period 01/05/1996, SpO2 97.00%. Physical Exam  Constitutional: She appears well-developed and well-nourished.  HENT:  Head: Normocephalic and atraumatic.  Eyes: EOM are normal. Pupils are equal, round, and reactive to light.  Cardiovascular: Normal rate.   Respiratory: Effort normal.    GI: Soft. She exhibits no distension. There is no tenderness.  Neurological: She is alert.  Skin: Skin is warm.  Psychiatric: She has a normal mood and affect. Her behavior is normal. Judgment and thought content normal.     Assessment/Plan Chest ulcer - irrigation and debridement of chest ulcer with placement of Acell and the VAC.  Risks and complications were discussed and include bleeding, pain, scaer and risk of anesthesia.  SANGER,Darrel Baroni 12/06/2011, 12:03 PM

## 2011-12-06 NOTE — Telephone Encounter (Signed)
LMOVM for Pt to return call.  

## 2011-12-06 NOTE — Telephone Encounter (Signed)
Pt called to request refill of LANTUS, this is a historical med, Pt also has HUMALOG on her med list of the same strength. Please advise.

## 2011-12-07 NOTE — Op Note (Signed)
[name], JEZEK              ACCOUNT NO.:  [id number]  MEDICAL RECORD NO.:  [id number]  LOCATION:  [facility] OR                         FACILITY:  [facility]  PHYSICIAN:  [employer], DO      DATE OF BIRTH:  March 22, 1950  DATE OF PROCEDURE:  12/06/2011 DATE OF DISCHARGE:  12/06/2011                              OPERATIVE REPORT   PREOPERATIVE DIAGNOSIS:  Sternal ulcer.  POSTOPERATIVE DIAGNOSIS:  Sternal ulcer.  PROCEDURE:  Irrigation and debridement of skin, subcutaneous tissue, muscle, bone for purpose of placement of ACell and VAC.  Wound size 3 x 2 x 2 cm.  ATTENDING:  Wayland Denis, DO.  ASSISTANT:  Shawn Rayburn, P.A.  ANESTHESIA:  General.  INDICATION FOR PROCEDURE:  The patient is a 61 year old female who had open heart surgery and a resultant sternal ulcer.  She has undergone local care and debridement in the past by her referring physician, and likely there were some nonviable bone at the base.  The decision was made to take her to the OR for irrigation, debridement, and placement of the ACell.  Risks and complications were reviewed and included bleeding, pain, scar, risk of anesthesia, she wished to proceed.  DESCRIPTION OF PROCEDURE:  The patient was taken to the operating room, placed on the operating room table in a supine position.  General anesthesia was administered.  Once adequate, a time-out was called.  All information was confirmed to be correct.  She was prepped and draped in usual sterile fashion.  A 10-blade was used to make an incision around the site to debride the nonviable tissue, and to open up the ulcer so that the bone could be properly debrided.  Several pieces were sent to Pathology for micro evaluation with Gram-stain culture and sensitivity. The bone was rongeured until it had healthier look to it with some bleeding, this was likely into the cartilage portion of the ribs, but we did get to the base of the posterior aspect of the  perichondrium of the ribs.  There was no drainage or malodor, and did not track anywhere else.  Copious amounts of irrigation was used to irrigate the wound.  ACell powder was placed and Adaptic with Hydrogel and a VAC sponge set at 100 mmHg pressure.  The patient tolerated the procedure well.  There were no complications.  She was awoken and taken to recovery room in stable condition.     Wayland Denis, DO     CS/MEDQ  D:  12/06/2011  T:  12/07/2011  Job:  [id number]

## 2011-12-07 NOTE — Telephone Encounter (Signed)
Appt scheduled for 10/15

## 2011-12-09 LAB — TISSUE CULTURE
Culture: NO GROWTH
Gram Stain: NONE SEEN
Gram Stain: NONE SEEN
Gram Stain: NONE SEEN

## 2011-12-10 ENCOUNTER — Encounter (HOSPITAL_COMMUNITY): Payer: Self-pay | Admitting: Plastic Surgery

## 2011-12-10 ENCOUNTER — Telehealth: Payer: Self-pay | Admitting: *Deleted

## 2011-12-10 NOTE — Telephone Encounter (Signed)
Patient notified of normal lab results.

## 2011-12-11 ENCOUNTER — Ambulatory Visit (INDEPENDENT_AMBULATORY_CARE_PROVIDER_SITE_OTHER): Payer: 59 | Admitting: Endocrinology

## 2011-12-11 ENCOUNTER — Encounter: Payer: Self-pay | Admitting: Endocrinology

## 2011-12-11 VITALS — BP 130/80 | HR 69 | Temp 97.7°F | Wt 168.0 lb

## 2011-12-11 DIAGNOSIS — E1149 Type 2 diabetes mellitus with other diabetic neurological complication: Secondary | ICD-10-CM

## 2011-12-11 DIAGNOSIS — E1142 Type 2 diabetes mellitus with diabetic polyneuropathy: Secondary | ICD-10-CM

## 2011-12-11 LAB — ANAEROBIC CULTURE

## 2011-12-11 LAB — HEMOGLOBIN A1C: Hgb A1c MFr Bld: 7.9 % — ABNORMAL HIGH (ref <5.7)

## 2011-12-11 NOTE — Patient Instructions (Addendum)
check your blood sugar twice a day.  vary the time of day when you check, between before the 3 meals, and at bedtime.  also check if you have symptoms of your blood sugar being too high or too low.  please keep a record of the readings and bring it to your next appointment here.  please call us sooner if your blood sugar goes below 70, or if you have a lot of readings over 200. Stop the metformin. blood tests are being requested for you today.  You will be contacted with results. Based on the results, we'll adjust the insulin.

## 2011-12-11 NOTE — Progress Notes (Signed)
Subjective:    Patient ID: [name], female    DOB: 01/20/1951, 61 y.o.   MRN: [id number]  HPI pt returns for f/u of insulin-requiring DM (dx'ed 1998; complicated by CAD, peripheral sensory neuropathy, and PAD).  she has been on insulin since soon after dx. She is overdue for f/u.  no cbg record, but states most cbg's are in the high-100's.  It is in general higher as the day goes on.   Past Medical History  Diagnosis Date  . Coronary atherosclerosis of native coronary artery     BMS circ 2001, DES LAD 2005, DES LAD and RCA 2008, subsequent CABG  . Drug allergy     Allergy to Plavix (hives) - took Ticlid in past  . Mixed hyperlipidemia   . Type 2 diabetes mellitus   . Arthritis     rheumatoid  . Depression   . Peripheral neuropathy   . Peptic ulcer disease ~ 1975  . Nephrolithiasis 02/07/2011  . Angina   . Breast cancer     Chemo and mastectomy- right  . Diabetes mellitus   . Chronic systolic heart failure     LVEF 40-45%  . Anxiety   . Sternal wound infection     CABG wound  . Stroke 07/2011  . Bronchitis     hx of   . CHF (congestive heart failure)     hx of Dec 2012  . Myocardial infarction ~ 2002    sees Dr. Diona Browner, saw last Aug 2013  . Essential hypertension, benign     sees Dr. Toni Amend, in [place]    Past Surgical History  Procedure Date  . Mastectomy 1988    right  . Cholecystectomy 1969  . Abdominal hysterectomy 1989  . Tonsillectomy and adenoidectomy 1979  . Appendectomy 1969  . Tubal ligation 1972  . Coronary artery bypass graft 01/11/2011    Procedure: CORONARY ARTERY BYPASS GRAFTING (CABG);  Surgeon: Kathlee Nations Suann Larry, MD;  Location: [facility] OR;  Service: Open Heart Surgery;  Laterality: N/A;  cabg x four, using left internal mammary artery and right leg greater saphenous vein harvestede endoscopically  . Sternal wound debridement 03/01/2011    Procedure: STERNAL WOUND DEBRIDEMENT;  Surgeon: Kathlee Nations Suann Larry, MD;  Location: [facility] OR;   Service: Open Heart Surgery;  Laterality: N/A;  . Application of wound vac 03/01/2011    Procedure: APPLICATION OF WOUND VAC;  Surgeon: Kathlee Nations Suann Larry, MD;  Location: [facility] OR;  Service: Open Heart Surgery;  Laterality: N/A;  . Breast surgery   . Sternal wires removal 04/02/2011    Procedure: STERNAL WIRES REMOVAL;  Surgeon: Kathlee Nations Suann Larry, MD;  Location: [facility] OR;  Service: Open Heart Surgery;  Laterality: N/A;  . Incision and drainage of wound 12/06/2011    Procedure: IRRIGATION AND DEBRIDEMENT WOUND;  Surgeon: Wayland Denis, DO;  Location: [facility] OR;  Service: Plastics;  Laterality: N/A;  IRRIGATION AND DEBRIDEMENT OF STERNAL ULCER AND PLACEMENT OF ACELL     History   Social History  . Marital Status: Married    Spouse Name: N/A    Number of Children: N/A  . Years of Education: N/A   Occupational History  . Not on file.   Social History Main Topics  . Smoking status: Former Smoker -- 1.0 packs/day for 20 years    Types: Cigarettes    Quit date: 02/27/2007  . Smokeless tobacco: Never Used  . Alcohol Use: No  . Drug Use:  No  . Sexually Active: Yes    Birth Control/ Protection: Post-menopausal   Other Topics Concern  . Not on file   Social History Narrative  . No narrative on file    Current Outpatient Prescriptions on File Prior to Visit  Medication Sig Dispense Refill  . aspirin 325 MG EC tablet Take 325 mg by mouth daily.      . carvedilol (COREG) 6.25 MG tablet Take 1 tablet (6.25 mg total) by mouth 2 (two) times daily.  180 tablet  3  . furosemide (LASIX) 40 MG tablet Take 40 mg by mouth every other day.      . insulin glargine (LANTUS) 100 UNIT/ML injection Inject 40 Units into the skin 2 (two) times daily.  30 mL  0  . insulin lispro (HUMALOG) 100 UNIT/ML injection Inject 20 Units into the skin 3 (three) times daily before meals.       . isosorbide mononitrate (IMDUR) 30 MG 24 hr tablet Take 30 mg by mouth daily.      . Multiple Vitamin (MULITIVITAMIN WITH MINERALS)  TABS Take 1 tablet by mouth daily.      Marland Kitchen PARoxetine (PAXIL) 20 MG tablet Take 20 mg by mouth daily.       . simvastatin (ZOCOR) 20 MG tablet Take 20 mg by mouth at bedtime.      . traMADol (ULTRAM) 50 MG tablet Take 50 mg by mouth every 6 (six) hours as needed.      Marland Kitchen trifluridine (VIROPTIC) 1 % ophthalmic solution 1 drop every 2 (two) hours.      . gabapentin (NEURONTIN) 300 MG capsule Take 300 mg by mouth 3 (three) times daily.         Allergies  Allergen Reactions  . Clopidogrel Bisulfate Hives  . Codeine Nausea And Vomiting  . Hydralazine Hives  . Morphine Nausea And Vomiting    Family History  Problem Relation Age of Onset  . Anesthesia problems Neg Hx     BP 130/80  Pulse 69  Temp 97.7 F (36.5 C) (Oral)  Wt 168 lb (76.204 kg)  LMP 01/05/1996    Review of Systems denies hypoglycemia.      Objective:   Physical Exam VITAL SIGNS:  See vs page GENERAL: no distress Pulses: dorsalis pedis intact bilat.   Feet: no deformity.  no ulcer on the feet.  feet are of normal color and temp.  no edema Neuro: sensation is intact to touch on the feet      Assessment & Plan:  DM, needs increased rx

## 2011-12-12 ENCOUNTER — Other Ambulatory Visit: Payer: Self-pay | Admitting: General Practice

## 2011-12-12 MED ORDER — INSULIN GLARGINE 100 UNIT/ML ~~LOC~~ SOLN: 40.0000 [IU] | Freq: Two times a day (BID) | SUBCUTANEOUS | Status: DC

## 2012-04-04 ENCOUNTER — Other Ambulatory Visit: Payer: Self-pay

## 2012-04-04 ENCOUNTER — Encounter: Payer: Self-pay | Admitting: Cardiothoracic Surgery

## 2012-04-04 ENCOUNTER — Ambulatory Visit (INDEPENDENT_AMBULATORY_CARE_PROVIDER_SITE_OTHER): Payer: 59 | Admitting: Cardiothoracic Surgery

## 2012-04-04 VITALS — BP 129/68 | HR 72 | Temp 97.2°F | Resp 18 | Ht 65.0 in | Wt 168.0 lb

## 2012-04-04 DIAGNOSIS — L02219 Cutaneous abscess of trunk, unspecified: Secondary | ICD-10-CM

## 2012-04-04 DIAGNOSIS — T8132XA Disruption of internal operation (surgical) wound, not elsewhere classified, initial encounter: Secondary | ICD-10-CM

## 2012-04-04 DIAGNOSIS — L03319 Cellulitis of trunk, unspecified: Secondary | ICD-10-CM

## 2012-04-04 NOTE — Progress Notes (Signed)
PCP is Leone Payor, MD Referring Provider is Leone Payor, MD  Chief Complaint  Patient presents with  . Chest Pain    c/o redness and increased pain at sternal incison site x 8 days    HPI: Patient returns with redness, tenderness and history of drainage from lower mid sternal incision. This started approximately 10 days ago. She denies fever. Her diabetic control is been poor do to noncompliance. Her blood sugar in the clinic today is 340 She was last evaluated in the fall and is been followed at the wound center clinic. She is currently not on antibiotics. She denies fever and his not toxic  Past Medical History  Diagnosis Date  . Coronary atherosclerosis of native coronary artery     BMS circ 2001, DES LAD 2005, DES LAD and RCA 2008, subsequent CABG  . Drug allergy     Allergy to Plavix (hives) - took Ticlid in past  . Mixed hyperlipidemia   . Type 2 diabetes mellitus   . Arthritis     rheumatoid  . Depression   . Peripheral neuropathy   . Peptic ulcer disease ~ 1975  . Nephrolithiasis 02/07/2011  . Angina   . Breast cancer     Chemo and mastectomy- right  . Diabetes mellitus   . Chronic systolic heart failure     LVEF 40-45%  . Anxiety   . Sternal wound infection     CABG wound  . Stroke 07/2011  . Bronchitis     hx of   . CHF (congestive heart failure)     hx of Dec 2012  . Myocardial infarction ~ 2002    sees Dr. Diona Browner, saw last Aug 2013  . Essential hypertension, benign     sees Dr. Toni Amend, in [place]    Past Surgical History  Procedure Date  . Mastectomy 1988    right  . Cholecystectomy 1969  . Abdominal hysterectomy 1989  . Tonsillectomy and adenoidectomy 1979  . Appendectomy 1969  . Tubal ligation 1972  . Coronary artery bypass graft 01/11/2011    Procedure: CORONARY ARTERY BYPASS GRAFTING (CABG);  Surgeon: Kathlee Nations Suann Larry, MD;  Location: [facility] OR;  Service: Open Heart Surgery;  Laterality: N/A;  cabg x four, using left  internal mammary artery and right leg greater saphenous vein harvestede endoscopically  . Sternal wound debridement 03/01/2011    Procedure: STERNAL WOUND DEBRIDEMENT;  Surgeon: Kathlee Nations Suann Larry, MD;  Location: [facility] OR;  Service: Open Heart Surgery;  Laterality: N/A;  . Application of wound vac 03/01/2011    Procedure: APPLICATION OF WOUND VAC;  Surgeon: Kathlee Nations Suann Larry, MD;  Location: [facility] OR;  Service: Open Heart Surgery;  Laterality: N/A;  . Breast surgery   . Sternal wires removal 04/02/2011    Procedure: STERNAL WIRES REMOVAL;  Surgeon: Kathlee Nations Suann Larry, MD;  Location: [facility] OR;  Service: Open Heart Surgery;  Laterality: N/A;  . Incision and drainage of wound 12/06/2011    Procedure: IRRIGATION AND DEBRIDEMENT WOUND;  Surgeon: Wayland Denis, DO;  Location: [facility] OR;  Service: Plastics;  Laterality: N/A;  IRRIGATION AND DEBRIDEMENT OF STERNAL ULCER AND PLACEMENT OF ACELL     Family History  Problem Relation Age of Onset  . Anesthesia problems Neg Hx     Social History History  Substance Use Topics  . Smoking status: Former Smoker -- 1.0 packs/day for 20 years    Types: Cigarettes    Quit date: 02/27/2007  .  Smokeless tobacco: Never Used  . Alcohol Use: No    Current Outpatient Prescriptions  Medication Sig Dispense Refill  . aspirin 325 MG EC tablet Take 325 mg by mouth daily.      . carvedilol (COREG) 6.25 MG tablet Take 1 tablet (6.25 mg total) by mouth 2 (two) times daily.  180 tablet  3  . furosemide (LASIX) 40 MG tablet Take 40 mg by mouth every other day.      . insulin glargine (LANTUS) 100 UNIT/ML injection Inject 40 Units into the skin 2 (two) times daily.  70 mL  4  . insulin lispro (HUMALOG) 100 UNIT/ML injection Inject 20 Units into the skin 3 (three) times daily before meals.       . isosorbide mononitrate (IMDUR) 30 MG 24 hr tablet Take 30 mg by mouth daily.      . Multiple Vitamin (MULITIVITAMIN WITH MINERALS) TABS Take 1 tablet by mouth daily.      Marland Kitchen PARoxetine  (PAXIL) 20 MG tablet Take 20 mg by mouth daily.       . simvastatin (ZOCOR) 20 MG tablet Take 20 mg by mouth at bedtime.        Allergies  Allergen Reactions  . Clopidogrel Bisulfate Hives  . Codeine Nausea And Vomiting  . Hydralazine Hives  . Morphine Nausea And Vomiting    Review of Systems no angina last cath showed patent grafts  BP 129/68  Pulse 72  Temp 97.2 F (36.2 C) (Oral)  Resp 18  Ht 5\' 5"  (1.651 m)  Wt 168 lb (76.204 kg)  BMI 27.96 kg/m2  SpO2 96%  LMP 01/05/1996 Physical Exam  alert and comfortable Lungs clear Cardiac rhythm regular Skin redness over the lower mid sternal incision. The sternum is stable. There is a tender wire that is palpable in the skin. The right costal ribs and some tenderness. There is no drainage fluctuance instability or exposed subcutaneous tissue.  Diagnostic Tests:  CT scan of the chest pending, CABG today is 340 Impression: [place] and her diabetic control was totally inadequate. She now has significant hyperglycemia and recurrent cellulitis. She may have costochondritis and we'll get a CT scan of the chest. We'll start the patient on oral doxycycline as she has had MRSA in the past. She may need to be admitted for IV antibiotics pending the results of the CT chest.  Plan: Return in 2 days for CT scan and reevaluation

## 2012-04-05 LAB — CREATININE, SERUM: Creat: 0.91 mg/dL (ref 0.50–1.10)

## 2012-04-05 LAB — BUN: BUN: 26 mg/dL — ABNORMAL HIGH (ref 6–23)

## 2012-04-07 ENCOUNTER — Ambulatory Visit
Admission: RE | Admit: 2012-04-07 | Discharge: 2012-04-07 | Disposition: A | Discharge status: Home / Self Care | Payer: 59 | Source: Ambulatory Visit | Attending: Cardiothoracic Surgery | Admitting: Cardiothoracic Surgery

## 2012-04-07 ENCOUNTER — Ambulatory Visit (INDEPENDENT_AMBULATORY_CARE_PROVIDER_SITE_OTHER): Payer: 59 | Admitting: Cardiothoracic Surgery

## 2012-04-07 ENCOUNTER — Ambulatory Visit (INDEPENDENT_AMBULATORY_CARE_PROVIDER_SITE_OTHER): Payer: 59 | Admitting: Endocrinology

## 2012-04-07 ENCOUNTER — Encounter: Payer: Self-pay | Admitting: Endocrinology

## 2012-04-07 ENCOUNTER — Encounter: Payer: Self-pay | Admitting: *Deleted

## 2012-04-07 VITALS — BP 124/80 | HR 80 | Wt 175.0 lb

## 2012-04-07 VITALS — BP 128/67 | HR 75 | Resp 16 | Ht 65.0 in | Wt 168.0 lb

## 2012-04-07 DIAGNOSIS — E1149 Type 2 diabetes mellitus with other diabetic neurological complication: Secondary | ICD-10-CM

## 2012-04-07 DIAGNOSIS — L02219 Cutaneous abscess of trunk, unspecified: Secondary | ICD-10-CM

## 2012-04-07 DIAGNOSIS — L03319 Cellulitis of trunk, unspecified: Secondary | ICD-10-CM

## 2012-04-07 DIAGNOSIS — E1142 Type 2 diabetes mellitus with diabetic polyneuropathy: Secondary | ICD-10-CM

## 2012-04-07 MED ORDER — IOHEXOL 300 MG/ML  SOLN
75.0000 mL | Freq: Once | INTRAMUSCULAR | Status: AC | PRN
  Administered 2012-04-07: 75 mL via INTRAVENOUS

## 2012-04-07 NOTE — Progress Notes (Signed)
PCP is Leone Payor, MD Referring Provider is Sanger, Alan Ripper, DO  Chief Complaint  Patient presents with  . Follow-up    with CT CHEST    [id number]:[id number] and tenderness improved after 3 days of oral doxycycline, drainage is resolved    Patient states blood sugars are running 150-200 and her hemoglobin A1c today at her endocrinologist office was 9.2   Past Medical History  Diagnosis Date  . Coronary atherosclerosis of native coronary artery     BMS circ 2001, DES LAD 2005, DES LAD and RCA 2008, subsequent CABG  . Drug allergy     Allergy to Plavix (hives) - took Ticlid in past  . Mixed hyperlipidemia   . Type 2 diabetes mellitus   . Arthritis     rheumatoid  . Depression   . Peripheral neuropathy   . Peptic ulcer disease ~ 1975  . Nephrolithiasis 02/07/2011  . Angina   . Breast cancer     Chemo and mastectomy- right  . Diabetes mellitus   . Chronic systolic heart failure     LVEF 40-45%  . Anxiety   . Sternal wound infection     CABG wound  . Stroke 07/2011  . Bronchitis     hx of   . CHF (congestive heart failure)     hx of Dec 2012  . Myocardial infarction ~ 2002    sees Dr. Diona Browner, saw last Aug 2013  . Essential hypertension, benign     sees Dr. Toni Amend, in [place]    Past Surgical History  Procedure Laterality Date  . Mastectomy  1988    right  . Cholecystectomy  1969  . Abdominal hysterectomy  1989  . Tonsillectomy and adenoidectomy  1979  . Appendectomy  1969  . Tubal ligation  1972  . Coronary artery bypass graft  01/11/2011    Procedure: CORONARY ARTERY BYPASS GRAFTING (CABG);  Surgeon: Kathlee Nations Suann Larry, MD;  Location: [facility] OR;  Service: Open Heart Surgery;  Laterality: N/A;  cabg x four, using left internal mammary artery and right leg greater saphenous vein harvestede endoscopically  . Sternal wound debridement  03/01/2011    Procedure: STERNAL WOUND DEBRIDEMENT;  Surgeon: Kathlee Nations Suann Larry, MD;  Location: [facility] OR;  Service: Open Heart  Surgery;  Laterality: N/A;  . Application of wound vac  03/01/2011    Procedure: APPLICATION OF WOUND VAC;  Surgeon: Kathlee Nations Suann Larry, MD;  Location: [facility] OR;  Service: Open Heart Surgery;  Laterality: N/A;  . Breast surgery    . Sternal wires removal  04/02/2011    Procedure: STERNAL WIRES REMOVAL;  Surgeon: Kathlee Nations Suann Larry, MD;  Location: [facility] OR;  Service: Open Heart Surgery;  Laterality: N/A;  . Incision and drainage of wound  12/06/2011    Procedure: IRRIGATION AND DEBRIDEMENT WOUND;  Surgeon: Wayland Denis, DO;  Location: [facility] OR;  Service: Plastics;  Laterality: N/A;  IRRIGATION AND DEBRIDEMENT OF STERNAL ULCER AND PLACEMENT OF ACELL     Family History  Problem Relation Age of Onset  . Anesthesia problems Neg Hx     Social History History  Substance Use Topics  . Smoking status: Former Smoker -- 1.00 packs/day for 20 years    Types: Cigarettes    Quit date: 02/27/2007  . Smokeless tobacco: Never Used  . Alcohol Use: No    Current Outpatient Prescriptions  Medication Sig Dispense Refill  . aspirin 325 MG EC tablet Take 325 mg by mouth daily.      Marland Kitchen  carvedilol (COREG) 6.25 MG tablet Take 1 tablet (6.25 mg total) by mouth 2 (two) times daily.  180 tablet  3  . furosemide (LASIX) 40 MG tablet Take 40 mg by mouth every other day.      . insulin glargine (LANTUS) 100 UNIT/ML injection Inject 50 Units into the skin 2 (two) times daily.      . insulin lispro (HUMALOG) 100 UNIT/ML injection Inject 30 Units into the skin 3 (three) times daily before meals.       . isosorbide mononitrate (IMDUR) 30 MG 24 hr tablet Take 30 mg by mouth daily.      . Multiple Vitamin (MULITIVITAMIN WITH MINERALS) TABS Take 1 tablet by mouth daily.      Marland Kitchen PARoxetine (PAXIL) 20 MG tablet Take 20 mg by mouth daily.       . simvastatin (ZOCOR) 20 MG tablet Take 20 mg by mouth at bedtime.      Marland Kitchen doxycycline (DORYX) 100 MG DR capsule Take 100 mg by mouth 2 (two) times daily.      Marland Kitchen sulfamethoxazole-trimethoprim  (BACTRIM DS,SEPTRA DS) 800-160 MG per tablet Take 1 tablet by mouth 2 (two) times daily.       No current facility-administered medications for this visit.    Allergies  Allergen Reactions  . Clopidogrel Bisulfate Hives  . Codeine Nausea And Vomiting  . Hydralazine Hives  . Morphine Nausea And Vomiting    Review of Systems no fever no angina no drainage from sternal wound improved tenderness after oral antibiotics started  BP 128/67  Pulse 75  Resp 16  Ht 5\' 5"  (1.651 m)  Wt 168 lb (76.204 kg)  BMI 27.96 kg/m2  SpO2 98%  LMP 01/05/1996 Physical Exam Alert and comfortable temperature 97.3 Lungs clear Sternal incision stable, no drainage, erythema improved significantly. Some tenderness on lower left side of the sternal incision without fluctuance or instability Cardiac rhythm regular No peripheral edema Diagnostic Tests: CT scan of the chest shows no evidence of mediastinal infection or osteomyelitis Scattered pulmonary nodules, low risk appearance  Impression: Probable superficial cellulitis associated with uncontrolled blood sugars when she went on her ocean cruise. Improved on oral doxycycline. We'll plan on adding Septra DS twice a day for one week in addition to 3 more weeks of doxycycline for past history of MRSA infection  Plan: Return for wound check in 3 weeks

## 2012-04-07 NOTE — Progress Notes (Signed)
Subjective:    Patient ID: [name], female    DOB: 30-Aug-1950, 62 y.o.   MRN: [id number]  HPI pt returns for f/u of insulin-requiring DM (dx'ed 1998; complicated by CAD, peripheral sensory neuropathy, and PAD; she has been on insulin since soon after dx). no cbg record, but states most cbg's are in the mid-100's.  It is in general higher as the day goes on.   Past Medical History  Diagnosis Date  . Coronary atherosclerosis of native coronary artery     BMS circ 2001, DES LAD 2005, DES LAD and RCA 2008, subsequent CABG  . Drug allergy     Allergy to Plavix (hives) - took Ticlid in past  . Mixed hyperlipidemia   . Type 2 diabetes mellitus   . Arthritis     rheumatoid  . Depression   . Peripheral neuropathy   . Peptic ulcer disease ~ 1975  . Nephrolithiasis 02/07/2011  . Angina   . Breast cancer     Chemo and mastectomy- right  . Diabetes mellitus   . Chronic systolic heart failure     LVEF 40-45%  . Anxiety   . Sternal wound infection     CABG wound  . Stroke 07/2011  . Bronchitis     hx of   . CHF (congestive heart failure)     hx of Dec 2012  . Myocardial infarction ~ 2002    sees Dr. Diona Browner, saw last Aug 2013  . Essential hypertension, benign     sees Dr. Toni Amend, in [place]    Past Surgical History  Procedure Laterality Date  . Mastectomy  1988    right  . Cholecystectomy  1969  . Abdominal hysterectomy  1989  . Tonsillectomy and adenoidectomy  1979  . Appendectomy  1969  . Tubal ligation  1972  . Coronary artery bypass graft  01/11/2011    Procedure: CORONARY ARTERY BYPASS GRAFTING (CABG);  Surgeon: Kathlee Nations Suann Larry, MD;  Location: [facility] OR;  Service: Open Heart Surgery;  Laterality: N/A;  cabg x four, using left internal mammary artery and right leg greater saphenous vein harvestede endoscopically  . Sternal wound debridement  03/01/2011    Procedure: STERNAL WOUND DEBRIDEMENT;  Surgeon: Kathlee Nations Suann Larry, MD;  Location: [facility] OR;  Service:  Open Heart Surgery;  Laterality: N/A;  . Application of wound vac  03/01/2011    Procedure: APPLICATION OF WOUND VAC;  Surgeon: Kathlee Nations Suann Larry, MD;  Location: [facility] OR;  Service: Open Heart Surgery;  Laterality: N/A;  . Breast surgery    . Sternal wires removal  04/02/2011    Procedure: STERNAL WIRES REMOVAL;  Surgeon: Kathlee Nations Suann Larry, MD;  Location: [facility] OR;  Service: Open Heart Surgery;  Laterality: N/A;  . Incision and drainage of wound  12/06/2011    Procedure: IRRIGATION AND DEBRIDEMENT WOUND;  Surgeon: Wayland Denis, DO;  Location: [facility] OR;  Service: Plastics;  Laterality: N/A;  IRRIGATION AND DEBRIDEMENT OF STERNAL ULCER AND PLACEMENT OF ACELL     History   Social History  . Marital Status: Married    Spouse Name: N/A    Number of Children: N/A  . Years of Education: N/A   Occupational History  . Not on file.   Social History Main Topics  . Smoking status: Former Smoker -- 1.00 packs/day for 20 years    Types: Cigarettes    Quit date: 02/27/2007  . Smokeless tobacco: Never Used  . Alcohol  Use: No  . Drug Use: No  . Sexually Active: Yes    Birth Control/ Protection: Post-menopausal   Other Topics Concern  . Not on file   Social History Narrative  . No narrative on file    Current Outpatient Prescriptions on File Prior to Visit  Medication Sig Dispense Refill  . aspirin 325 MG EC tablet Take 325 mg by mouth daily.      . carvedilol (COREG) 6.25 MG tablet Take 1 tablet (6.25 mg total) by mouth 2 (two) times daily.  180 tablet  3  . furosemide (LASIX) 40 MG tablet Take 40 mg by mouth every other day.      . insulin lispro (HUMALOG) 100 UNIT/ML injection Inject 30 Units into the skin 3 (three) times daily before meals.       . isosorbide mononitrate (IMDUR) 30 MG 24 hr tablet Take 30 mg by mouth daily.      . Multiple Vitamin (MULITIVITAMIN WITH MINERALS) TABS Take 1 tablet by mouth daily.      Marland Kitchen PARoxetine (PAXIL) 20 MG tablet Take 20 mg by mouth daily.       .  simvastatin (ZOCOR) 20 MG tablet Take 20 mg by mouth at bedtime.      . insulin glargine (LANTUS) 100 UNIT/ML injection Inject 50 Units into the skin 2 (two) times daily.       No current facility-administered medications on file prior to visit.    Allergies  Allergen Reactions  . Clopidogrel Bisulfate Hives  . Codeine Nausea And Vomiting  . Hydralazine Hives  . Morphine Nausea And Vomiting    Family History  Problem Relation Age of Onset  . Anesthesia problems Neg Hx     BP 124/80  Pulse 80  Wt 175 lb (79.379 kg)  BMI 29.12 kg/m2  SpO2 98%  LMP 01/05/1996    Review of Systems denies hypoglycemia    Objective:   Physical Exam VITAL SIGNS:  See vs page GENERAL: no distress SKIN:  Insulin injection sites at the anterior abdomen are normal   Lab Results  Component Value Date   HGBA1C 9.9* 04/07/2012      Assessment & Plan:  DM: needs increased rx

## 2012-04-07 NOTE — Patient Instructions (Addendum)
check your blood sugar twice a day.  vary the time of day when you check, between before the 3 meals, and at bedtime.  also check if you have symptoms of your blood sugar being too high or too low.  please keep a record of the readings and bring it to your next appointment here.  please call us sooner if your blood sugar goes below 70, or if you have a lot of readings over 200.   blood tests are being requested for you today.  You will be contacted with the result. Please come back for a follow-up appointment in 3 months.

## 2012-04-17 ENCOUNTER — Telehealth: Payer: Self-pay | Admitting: Cardiology

## 2012-04-17 NOTE — Telephone Encounter (Signed)
Patient needs RX's called to [facility]. Patient needs Carvedilol, Furosemide, Isosorb-mono called in for now. / tgs

## 2012-04-18 MED ORDER — FUROSEMIDE 40 MG PO TABS: 40.0000 mg | ORAL_TABLET | ORAL | Status: DC

## 2012-04-18 MED ORDER — ISOSORBIDE MONONITRATE ER 30 MG PO TB24: 30.0000 mg | ORAL_TABLET | Freq: Every day | ORAL | Status: DC

## 2012-04-18 MED ORDER — CARVEDILOL 6.25 MG PO TABS: 6.2500 mg | ORAL_TABLET | Freq: Two times a day (BID) | ORAL | Status: DC

## 2012-04-18 NOTE — Telephone Encounter (Signed)
Noted pt is overdue for follow up visit, pt was advised on 10-25-11 to follow up in 6 weeks, pt accepted apt for 04-21-12 at 1pm, sent refills for 90 days per mail order, advised pt she will need to keep this apt in order to continue to receive her medications, pt understood, all sent via escribe to [facility]

## 2012-04-21 ENCOUNTER — Encounter: Payer: Self-pay | Admitting: Cardiology

## 2012-04-21 ENCOUNTER — Ambulatory Visit (INDEPENDENT_AMBULATORY_CARE_PROVIDER_SITE_OTHER): Payer: 59 | Admitting: Cardiology

## 2012-04-21 VITALS — BP 122/65 | HR 67 | Ht 65.0 in | Wt 175.2 lb

## 2012-04-21 DIAGNOSIS — I2510 Atherosclerotic heart disease of native coronary artery without angina pectoris: Secondary | ICD-10-CM

## 2012-04-21 DIAGNOSIS — I10 Essential (primary) hypertension: Secondary | ICD-10-CM

## 2012-04-21 DIAGNOSIS — E1142 Type 2 diabetes mellitus with diabetic polyneuropathy: Secondary | ICD-10-CM

## 2012-04-21 DIAGNOSIS — E785 Hyperlipidemia, unspecified: Secondary | ICD-10-CM

## 2012-04-21 DIAGNOSIS — E1149 Type 2 diabetes mellitus with other diabetic neurological complication: Secondary | ICD-10-CM

## 2012-04-21 NOTE — Assessment & Plan Note (Signed)
No angina symptoms. Continue medical therapy and observation.

## 2012-04-21 NOTE — Assessment & Plan Note (Signed)
Blood pressure well-controlled today. 

## 2012-04-21 NOTE — Progress Notes (Signed)
Clinical Summary [name] is a medically complex 62 y.o.female presenting for followup. I last saw her in August 2013. She has followed with Dr. Everardo All and Dr. Donata Clay since that time.   Recent lab work shows BUN 26, creatinine 0.9, hemoglobin A1c 9.9. ECG today shows sinus rhythm with RBBB, LAFB, LVH with repolarization changes.  She reports no angina. She is not exercising at this time. I talked with her about a walking regimen for her cardiovascular health, also to help further regulate her glucose.  She states that she had lipids obtained by her primary care physician back in December, and no changes were required in her Zocor dose.  Allergies  Allergen Reactions  . Clopidogrel Bisulfate Hives  . Codeine Nausea And Vomiting  . Hydralazine Hives  . Morphine Nausea And Vomiting    Current Outpatient Prescriptions  Medication Sig Dispense Refill  . aspirin 325 MG EC tablet Take 325 mg by mouth daily.      . carvedilol (COREG) 6.25 MG tablet Take 1 tablet (6.25 mg total) by mouth 2 (two) times daily.  180 tablet  0  . doxycycline (DORYX) 100 MG DR capsule Take 100 mg by mouth 2 (two) times daily.      . furosemide (LASIX) 40 MG tablet Take 1 tablet (40 mg total) by mouth every other day.  90 tablet  0  . insulin glargine (LANTUS) 100 UNIT/ML injection Inject 50 Units into the skin 2 (two) times daily.      . insulin lispro (HUMALOG) 100 UNIT/ML injection Inject 30 Units into the skin 3 (three) times daily before meals.       . isosorbide mononitrate (IMDUR) 30 MG 24 hr tablet Take 1 tablet (30 mg total) by mouth daily.  90 tablet  0  . Multiple Vitamin (MULITIVITAMIN WITH MINERALS) TABS Take 1 tablet by mouth daily.      Marland Kitchen PARoxetine (PAXIL) 20 MG tablet Take 20 mg by mouth daily.       . simvastatin (ZOCOR) 20 MG tablet Take 20 mg by mouth at bedtime.       No current facility-administered medications for this visit.    Past Medical History  Diagnosis Date  . Coronary  atherosclerosis of native coronary artery     BMS circ 2001, DES LAD 2005, DES LAD and RCA 2008, subsequent CABG  . Drug allergy     Allergy to Plavix (hives) - took Ticlid in past  . Mixed hyperlipidemia   . Type 2 diabetes mellitus   . Rheumatoid arthritis   . Depression   . Peripheral neuropathy   . Peptic ulcer disease ~ 1975  . Nephrolithiasis 02/07/2011  . Breast cancer     Chemo and mastectomy - right  . Chronic systolic heart failure     LVEF 40-45%  . Anxiety   . Sternal wound infection     CABG  . Stroke 07/2011    Past Surgical History  Procedure Laterality Date  . Mastectomy  1988    right  . Cholecystectomy  1969  . Abdominal hysterectomy  1989  . Tonsillectomy and adenoidectomy  1979  . Appendectomy  1969  . Tubal ligation  1972  . Coronary artery bypass graft  01/11/2011    Procedure: CORONARY ARTERY BYPASS GRAFTING (CABG);  Surgeon: Kathlee Nations Suann Larry, MD;  Location: [facility] OR;  Service: Open Heart Surgery;  Laterality: N/A;  cabg x four, using left internal mammary artery and right leg greater  saphenous vein harvestede endoscopically  . Sternal wound debridement  03/01/2011    Procedure: STERNAL WOUND DEBRIDEMENT;  Surgeon: Kathlee Nations Suann Larry, MD;  Location: [facility] OR;  Service: Open Heart Surgery;  Laterality: N/A;  . Application of wound vac  03/01/2011    Procedure: APPLICATION OF WOUND VAC;  Surgeon: Kathlee Nations Suann Larry, MD;  Location: [facility] OR;  Service: Open Heart Surgery;  Laterality: N/A;  . Breast surgery    . Sternal wires removal  04/02/2011    Procedure: STERNAL WIRES REMOVAL;  Surgeon: Kathlee Nations Suann Larry, MD;  Location: [facility] OR;  Service: Open Heart Surgery;  Laterality: N/A;  . Incision and drainage of wound  12/06/2011    Procedure: IRRIGATION AND DEBRIDEMENT WOUND;  Surgeon: Wayland Denis, DO;  Location: [facility] OR;  Service: Plastics;  Laterality: N/A;  IRRIGATION AND DEBRIDEMENT OF STERNAL ULCER AND PLACEMENT OF ACELL     Social History [name] reports that  she quit smoking about 5 years ago. Her smoking use included Cigarettes. She has a 20 pack-year smoking history. She has never used smokeless tobacco. [name] reports that she does not drink alcohol.  Review of Systems No palpitations, edema, orthopnea or PND. Reports no bleeding problems.  Physical Examination Filed Vitals:   04/21/12 1300  BP: 122/65  Pulse: 67   Filed Weights   04/21/12 1300  Weight: 175 lb 4 oz (79.493 kg)    NAD.  HEENT: Conjunctiva and lids normal, oropharynx clear. Neck: Supple, no elevated JVP or carotid bruits, no thyromegaly.  Lungs: Clear to auscultation, diminished at bases, nonlabored breathing at rest.  Cardiac: Regular rate and rhythm, no S3 or pericardial rub.  Thorax: Sternal wound without drainage or fluctuance. Extremities: No pitting edema, distal pulses1+.  Skin: Warm and dry.    Problem List and Plan   Coronary atherosclerosis of native coronary artery No angina symptoms. Continue medical therapy and observation.  Essential hypertension, benign Blood pressure well controlled today.  HYPERLIPIDEMIA-MIXED Continues on statin therapy, followed by primary care.  DM type 2 with diabetic peripheral neuropathy Keep follow up with Dr. Everardo All for glucose management.    Jonelle Sidle, M.D., F.A.C.C.

## 2012-04-21 NOTE — Assessment & Plan Note (Signed)
Continues on statin therapy, followed by primary care.

## 2012-04-21 NOTE — Assessment & Plan Note (Signed)
Keep follow up with Dr. Everardo All for glucose management.

## 2012-04-21 NOTE — Patient Instructions (Addendum)
Your physician recommends that you schedule a follow-up appointment in: 3 MONTH  Your physician recommends that you continue on your current medications as directed. Please refer to the Current Medication list given to you today.

## 2012-04-30 ENCOUNTER — Encounter: Payer: Self-pay | Admitting: Cardiothoracic Surgery

## 2012-04-30 ENCOUNTER — Ambulatory Visit (INDEPENDENT_AMBULATORY_CARE_PROVIDER_SITE_OTHER): Payer: 59 | Admitting: Cardiothoracic Surgery

## 2012-04-30 VITALS — BP 118/65 | HR 76 | Resp 16 | Ht 65.0 in | Wt 175.0 lb

## 2012-04-30 DIAGNOSIS — E119 Type 2 diabetes mellitus without complications: Secondary | ICD-10-CM

## 2012-04-30 DIAGNOSIS — L03319 Cellulitis of trunk, unspecified: Secondary | ICD-10-CM

## 2012-04-30 DIAGNOSIS — Z951 Presence of aortocoronary bypass graft: Secondary | ICD-10-CM

## 2012-04-30 DIAGNOSIS — I2510 Atherosclerotic heart disease of native coronary artery without angina pectoris: Secondary | ICD-10-CM

## 2012-04-30 DIAGNOSIS — L02219 Cutaneous abscess of trunk, unspecified: Secondary | ICD-10-CM

## 2012-04-30 NOTE — Progress Notes (Signed)
PCP is Leone Payor, MD Referring Provider is Romero Belling, MD  Chief Complaint  Patient presents with  . Routine Post Op    3 week f/u for wound check    HPI: Patient returns for a 3 week followup for wound check of recurrent superficial cellulitis of her distal sternal incision following CABG in 2012. She's placed on oral doxycycline as well as a one-week course of Septra. There is been no more drainage. There is still some tenderness and erythema of the skin. No fluctuance or instability of the sternum noted. She is still taking doxycycline 100 mg by mouth twice a day. We'll reduce that to 100 mg by mouth daily for 8 weeks and reassess. CT scan showed no evidence of deep infection. She probably had a facial exacerbation do to poor diabetic control when she was away on a cruise. The patient was recently assessed by her cardiologist Dr. Diona Browner felt that he was doing well. The patient has had a cardiac catheterization since surgery which showed patent grafts. Following that procedure she had a transient CVA from which she is recovered most function. Past Medical History  Diagnosis Date  . Coronary atherosclerosis of native coronary artery     BMS circ 2001, DES LAD 2005, DES LAD and RCA 2008, subsequent CABG  . Drug allergy     Allergy to Plavix (hives) - took Ticlid in past  . Mixed hyperlipidemia   . Type 2 diabetes mellitus   . Rheumatoid arthritis   . Depression   . Peripheral neuropathy   . Peptic ulcer disease ~ 1975  . Nephrolithiasis 02/07/2011  . Breast cancer     Chemo and mastectomy - right  . Chronic systolic heart failure     LVEF 40-45%  . Anxiety   . Sternal wound infection     CABG  . Stroke 07/2011    Past Surgical History  Procedure Laterality Date  . Mastectomy  1988    right  . Cholecystectomy  1969  . Abdominal hysterectomy  1989  . Tonsillectomy and adenoidectomy  1979  . Appendectomy  1969  . Tubal ligation  1972  . Coronary artery bypass graft   01/11/2011    Procedure: CORONARY ARTERY BYPASS GRAFTING (CABG);  Surgeon: Kathlee Nations Suann Larry, MD;  Location: [facility] OR;  Service: Open Heart Surgery;  Laterality: N/A;  cabg x four, using left internal mammary artery and right leg greater saphenous vein harvestede endoscopically  . Sternal wound debridement  03/01/2011    Procedure: STERNAL WOUND DEBRIDEMENT;  Surgeon: Kathlee Nations Suann Larry, MD;  Location: [facility] OR;  Service: Open Heart Surgery;  Laterality: N/A;  . Application of wound vac  03/01/2011    Procedure: APPLICATION OF WOUND VAC;  Surgeon: Kathlee Nations Suann Larry, MD;  Location: [facility] OR;  Service: Open Heart Surgery;  Laterality: N/A;  . Breast surgery    . Sternal wires removal  04/02/2011    Procedure: STERNAL WIRES REMOVAL;  Surgeon: Kathlee Nations Suann Larry, MD;  Location: [facility] OR;  Service: Open Heart Surgery;  Laterality: N/A;  . Incision and drainage of wound  12/06/2011    Procedure: IRRIGATION AND DEBRIDEMENT WOUND;  Surgeon: Wayland Denis, DO;  Location: [facility] OR;  Service: Plastics;  Laterality: N/A;  IRRIGATION AND DEBRIDEMENT OF STERNAL ULCER AND PLACEMENT OF ACELL     Family History  Problem Relation Age of Onset  . Anesthesia problems Neg Hx     Social History History  Substance Use  Topics  . Smoking status: Former Smoker -- 1.00 packs/day for 20 years    Types: Cigarettes    Quit date: 02/27/2007  . Smokeless tobacco: Never Used  . Alcohol Use: No    Current Outpatient Prescriptions  Medication Sig Dispense Refill  . aspirin 325 MG EC tablet Take 325 mg by mouth daily.      . carvedilol (COREG) 6.25 MG tablet Take 1 tablet (6.25 mg total) by mouth 2 (two) times daily.  180 tablet  0  . doxycycline (DORYX) 100 MG DR capsule Take 100 mg by mouth 2 (two) times daily.      . furosemide (LASIX) 40 MG tablet Take 1 tablet (40 mg total) by mouth every other day.  90 tablet  0  . insulin glargine (LANTUS) 100 UNIT/ML injection Inject 50 Units into the skin 2 (two) times daily.      .  insulin lispro (HUMALOG) 100 UNIT/ML injection Inject 30 Units into the skin 3 (three) times daily before meals.       . isosorbide mononitrate (IMDUR) 30 MG 24 hr tablet Take 1 tablet (30 mg total) by mouth daily.  90 tablet  0  . Multiple Vitamin (MULITIVITAMIN WITH MINERALS) TABS Take 1 tablet by mouth daily.      Marland Kitchen PARoxetine (PAXIL) 20 MG tablet Take 20 mg by mouth daily.       . simvastatin (ZOCOR) 20 MG tablet Take 20 mg by mouth at bedtime.       No current facility-administered medications for this visit.    Allergies  Allergen Reactions  . Clopidogrel Bisulfate Hives  . Codeine Nausea And Vomiting  . Hydralazine Hives  . Morphine Nausea And Vomiting    Review of Systems no fevers no drainage from incision no angina  BP 118/65  Pulse 76  Resp 16  Ht 5\' 5"  (1.651 m)  Wt 175 lb (79.379 kg)  BMI 29.12 kg/m2  SpO2 96%  LMP 01/05/1996 Physical Exam Alert and comfortable Breath sounds clear Sternum stable Lower sternal incision with some superficial erythema and mild tenderness no drainage no fluctuance  Diagnostic Tests: No chest x-rays performed today  Impression: Recurrent superficial lower sternal cellulitis improving with oral antibiotics  Plan: Continue doxycycline 100 mg daily as a suppressive dose for 8 weeks and reassess.

## 2012-05-05 ENCOUNTER — Other Ambulatory Visit: Payer: Self-pay | Admitting: Cardiothoracic Surgery

## 2012-05-05 DIAGNOSIS — Z139 Encounter for screening, unspecified: Secondary | ICD-10-CM

## 2012-05-07 ENCOUNTER — Telehealth: Payer: Self-pay | Admitting: Cardiology

## 2012-05-07 MED ORDER — CARVEDILOL 6.25 MG PO TABS: 6.2500 mg | ORAL_TABLET | Freq: Two times a day (BID) | ORAL | Status: DC

## 2012-05-07 MED ORDER — ISOSORBIDE MONONITRATE ER 30 MG PO TB24: 30.0000 mg | ORAL_TABLET | Freq: Every day | ORAL | Status: DC

## 2012-05-07 MED ORDER — SIMVASTATIN 20 MG PO TABS: 20.0000 mg | ORAL_TABLET | Freq: Every day | ORAL | Status: DC

## 2012-05-07 MED ORDER — FUROSEMIDE 40 MG PO TABS: 40.0000 mg | ORAL_TABLET | ORAL | Status: DC

## 2012-05-07 NOTE — Telephone Encounter (Signed)
PT NEEDS ALL CARDIAC MEDS CALLED IN, THEY WERE CALLED IN TO WRONG PHARMACY.  PLEASE CALL IN TO  [facility] (F) [phone] WITH [employer]NANA # [id number]

## 2012-05-07 NOTE — Telephone Encounter (Signed)
Reviewed pharmacy in the chart and unable to locate a [facility] in our system that has an escribe attached to it, nor a fax number, contacted [facility] placed company address/phone numbers/fax numbers and any other possible way to put in the system and unable to locate a way to escribe to this company and RS rep advised they have not merged with another company to change name, no other listing possbile gave pt information and medication refills for Zocor, Imdur, Coreg and Lasix for 90 day supply, called pt to advise RX has been called in per system issue and call made to company to research issue, pt understood and will expect mail order in 7-10 bus days.

## 2012-05-08 ENCOUNTER — Ambulatory Visit (HOSPITAL_COMMUNITY)
Admission: RE | Admit: 2012-05-08 | Discharge: 2012-05-08 | Disposition: A | Discharge status: Home / Self Care | Payer: Medicare FFS | Source: Ambulatory Visit | Attending: Cardiothoracic Surgery | Admitting: Cardiothoracic Surgery

## 2012-05-08 DIAGNOSIS — Z1231 Encounter for screening mammogram for malignant neoplasm of breast: Secondary | ICD-10-CM | POA: Insufficient documentation

## 2012-05-08 DIAGNOSIS — Z139 Encounter for screening, unspecified: Secondary | ICD-10-CM

## 2012-06-25 ENCOUNTER — Ambulatory Visit: Payer: 59 | Admitting: Cardiothoracic Surgery

## 2012-07-07 ENCOUNTER — Ambulatory Visit (INDEPENDENT_AMBULATORY_CARE_PROVIDER_SITE_OTHER): Payer: 59 | Admitting: Endocrinology

## 2012-07-07 ENCOUNTER — Encounter: Payer: Self-pay | Admitting: Endocrinology

## 2012-07-07 VITALS — BP 126/74 | HR 72 | Ht 65.0 in | Wt 176.0 lb

## 2012-07-07 DIAGNOSIS — E1149 Type 2 diabetes mellitus with other diabetic neurological complication: Secondary | ICD-10-CM

## 2012-07-07 DIAGNOSIS — E1142 Type 2 diabetes mellitus with diabetic polyneuropathy: Secondary | ICD-10-CM

## 2012-07-07 MED ORDER — INSULIN NPH (HUMAN) (ISOPHANE) 100 UNIT/ML ~~LOC~~ SUSP: 50.0000 [IU] | Freq: Every day | SUBCUTANEOUS | Status: DC

## 2012-07-07 MED ORDER — INSULIN LISPRO 100 UNIT/ML ~~LOC~~ SOLN: 60.0000 [IU] | Freq: Three times a day (TID) | SUBCUTANEOUS | Status: DC

## 2012-07-07 NOTE — Progress Notes (Signed)
Subjective:    Patient ID: [name], female    DOB: 07/05/1950, 62 y.o.   MRN: [id number]  HPI pt returns for f/u of insulin-requiring DM (dx'ed 1998; complicated by CAD, peripheral sensory neuropathy, and PAD; she has been on insulin since soon after dx). no cbg record, but states most cbg's are in the mid to high-100's.  It is in general higher as the day goes on.  Past Medical History  Diagnosis Date  . Coronary atherosclerosis of native coronary artery     BMS circ 2001, DES LAD 2005, DES LAD and RCA 2008, subsequent CABG  . Drug allergy     Allergy to Plavix (hives) - took Ticlid in past  . Mixed hyperlipidemia   . Type 2 diabetes mellitus   . Rheumatoid arthritis   . Depression   . Peripheral neuropathy   . Peptic ulcer disease ~ 1975  . Nephrolithiasis 02/07/2011  . Breast cancer     Chemo and mastectomy - right  . Chronic systolic heart failure     LVEF 40-45%  . Anxiety   . Sternal wound infection     CABG  . Stroke 07/2011    Past Surgical History  Procedure Laterality Date  . Mastectomy  1988    right  . Cholecystectomy  1969  . Abdominal hysterectomy  1989  . Tonsillectomy and adenoidectomy  1979  . Appendectomy  1969  . Tubal ligation  1972  . Coronary artery bypass graft  01/11/2011    Procedure: CORONARY ARTERY BYPASS GRAFTING (CABG);  Surgeon: Kathlee Nations Suann Larry, MD;  Location: [facility] OR;  Service: Open Heart Surgery;  Laterality: N/A;  cabg x four, using left internal mammary artery and right leg greater saphenous vein harvestede endoscopically  . Sternal wound debridement  03/01/2011    Procedure: STERNAL WOUND DEBRIDEMENT;  Surgeon: Kathlee Nations Suann Larry, MD;  Location: [facility] OR;  Service: Open Heart Surgery;  Laterality: N/A;  . Application of wound vac  03/01/2011    Procedure: APPLICATION OF WOUND VAC;  Surgeon: Kathlee Nations Suann Larry, MD;  [facility] OR;  Service: Open Heart Surgery;  Laterality: N/A;  . Breast surgery    . Sternal wires removal  04/02/2011     Procedure: STERNAL WIRES REMOVAL;  Surgeon: Kathlee Nations Suann Larry, MD;  Location: [facility] OR;  Service: Open Heart Surgery;  Laterality: N/A;  . Incision and drainage of wound  12/06/2011    Procedure: IRRIGATION AND DEBRIDEMENT WOUND;  Surgeon: Wayland Denis, DO;  Location: [facility] OR;  Service: Plastics;  Laterality: N/A;  IRRIGATION AND DEBRIDEMENT OF STERNAL ULCER AND PLACEMENT OF ACELL     History   Social History  . Marital Status: Married    Spouse Name: N/A    Number of Children: N/A  . Years of Education: N/A   Occupational History  . Not on file.   Social History Main Topics  . Smoking status: Former Smoker -- 1.00 packs/day for 20 years    Types: Cigarettes    Quit date: 02/27/2007  . Smokeless tobacco: Never Used  . Alcohol Use: No  . Drug Use: No  . Sexually Active: Yes    Birth Control/ Protection: Post-menopausal   Other Topics Concern  . Not on file   Social History Narrative  . No narrative on file    Current Outpatient Prescriptions on File Prior to Visit  Medication Sig Dispense Refill  . aspirin 325 MG EC tablet Take 325 mg  by mouth daily.      . carvedilol (COREG) 6.25 MG tablet Take 1 tablet (6.25 mg total) by mouth 2 (two) times daily.  180 tablet  0  . doxycycline (DORYX) 100 MG DR capsule Take 100 mg by mouth 2 (two) times daily.      . furosemide (LASIX) 40 MG tablet Take 1 tablet (40 mg total) by mouth every other day.  90 tablet  0  . isosorbide mononitrate (IMDUR) 30 MG 24 hr tablet Take 1 tablet (30 mg total) by mouth daily.  90 tablet  0  . Multiple Vitamin (MULITIVITAMIN WITH MINERALS) TABS Take 1 tablet by mouth daily.      Marland Kitchen PARoxetine (PAXIL) 20 MG tablet Take 20 mg by mouth daily.       . simvastatin (ZOCOR) 20 MG tablet Take 1 tablet (20 mg total) by mouth at bedtime.  90 tablet  0   No current facility-administered medications on file prior to visit.    Allergies  Allergen Reactions  . Clopidogrel Bisulfate Hives  . Codeine Nausea And  Vomiting  . Hydralazine Hives  . Morphine Nausea And Vomiting    Family History  Problem Relation Age of Onset  . Anesthesia problems Neg Hx     BP 126/74  Pulse 72  Ht 5\' 5"  (1.651 m)  Wt 176 lb (79.833 kg)  BMI 29.29 kg/m2  SpO2 97%  LMP 01/05/1996  Review of Systems denies hypoglycemia    Objective:   Physical Exam VITAL SIGNS:  See vs page GENERAL: no distress     Assessment & Plan:  DM: pt requests cheaper insulin

## 2012-07-07 NOTE — Patient Instructions (Addendum)
check your blood sugar twice a day.  vary the time of day when you check, between before the 3 meals, and at bedtime.  also check if you have symptoms of your blood sugar being too high or too low.  please keep a record of the readings and bring it to your next appointment here.  please call us sooner if your blood sugar goes below 70, or if you have a lot of readings over 200.   blood tests are being requested for you today.  You will be contacted with the result. Please come back for a follow-up appointment in 3 months. Change lantus to NPH, 50 units at bedtime only Increase humalog to 60 units 3 times a day (just before each meal).

## 2012-07-16 ENCOUNTER — Ambulatory Visit (INDEPENDENT_AMBULATORY_CARE_PROVIDER_SITE_OTHER): Payer: 59 | Admitting: Cardiothoracic Surgery

## 2012-07-16 ENCOUNTER — Telehealth: Payer: Self-pay | Admitting: Endocrinology

## 2012-07-16 VITALS — BP 154/68 | HR 75 | Resp 16 | Ht 65.0 in | Wt 175.0 lb

## 2012-07-16 DIAGNOSIS — Z951 Presence of aortocoronary bypass graft: Secondary | ICD-10-CM

## 2012-07-16 MED ORDER — INSULIN NPH (HUMAN) (ISOPHANE) 100 UNIT/ML ~~LOC~~ SUSP: 50.0000 [IU] | Freq: Every day | SUBCUTANEOUS | Status: DC

## 2012-07-16 NOTE — Progress Notes (Signed)
PCP is [name] Payor, MD Referring Provider is [name] Payor, MD  Chief Complaint  Patient presents with  . Routine Post Op    1 month f/u    HPI: 3 month followup for wound check Poorly controlled diabetic with superficial sternal wound infection status post CABG-now healed Patient had a stroke following a cardiac catheterization which documented patent grafts done because of recurrent atypical chest pain Patient now takes doxycycline 100 mg a day and has had no drainage or pain of the sternal incision. He remains mildly erythematous. Patient has some pain around the left breast and chest wall, tender to palpation. Recent mammogram was negative. Exam today shows no evidence of cellulitis. CT chest performed approximately 3 months ago showed no evidence of sternal or chest wall edema-inflammation.  Patient admits to having some episodes of falling which sounds orthostatic or positional possibly related to diabetic  autonomic dysfunction   Past Medical History  Diagnosis Date  . Coronary atherosclerosis of native coronary artery     BMS circ 2001, DES LAD 2005, DES LAD and RCA 2008, subsequent CABG  . Drug allergy     Allergy to Plavix (hives) - took Ticlid in past  . Mixed hyperlipidemia   . Type 2 diabetes mellitus   . Rheumatoid arthritis   . Depression   . Peripheral neuropathy   . Peptic ulcer disease ~ 1975  . Nephrolithiasis 02/07/2011  . Breast cancer     Chemo and mastectomy - right  . Chronic systolic heart failure     LVEF 40-45%  . Anxiety   . Sternal wound infection     CABG  . Stroke 07/2011    Past Surgical History  Procedure Laterality Date  . Mastectomy  1988    right  . Cholecystectomy  1969  . Abdominal hysterectomy  1989  . Tonsillectomy and adenoidectomy  1979  . Appendectomy  1969  . Tubal ligation  1972  . Coronary artery bypass graft  01/11/2011    Procedure: CORONARY ARTERY BYPASS GRAFTING (CABG);  Surgeon: Kathlee Nations Suann Larry, MD;   Location: [facility] OR;  Service: Open Heart Surgery;  Laterality: N/A;  cabg x four, using left internal mammary artery and right leg greater saphenous vein harvestede endoscopically  . Sternal wound debridement  03/01/2011    Procedure: STERNAL WOUND DEBRIDEMENT;  Surgeon: Kathlee Nations Suann Larry, MD;  Location: [facility] OR;  Service: Open Heart Surgery;  Laterality: N/A;  . Application of wound vac  03/01/2011    Procedure: APPLICATION OF WOUND VAC;  Surgeon: Kathlee Nations Suann Larry, MD;  Location: [facility] OR;  Service: Open Heart Surgery;  Laterality: N/A;  . Breast surgery    . Sternal wires removal  04/02/2011    Procedure: STERNAL WIRES REMOVAL;  Surgeon: Kathlee Nations Suann Larry, MD;  Location: [facility] OR;  Service: Open Heart Surgery;  Laterality: N/A;  . Incision and drainage of wound  12/06/2011    Procedure: IRRIGATION AND DEBRIDEMENT WOUND;  Surgeon: Wayland Denis, DO;  Location: [facility] OR;  Service: Plastics;  Laterality: N/A;  IRRIGATION AND DEBRIDEMENT OF STERNAL ULCER AND PLACEMENT OF ACELL     Family History  Problem Relation Age of Onset  . Anesthesia problems Neg Hx     Social History History  Substance Use Topics  . Smoking status: Former Smoker -- 1.00 packs/day for 20 years    Types: Cigarettes    Quit date: 02/27/2007  . Smokeless tobacco: Never Used  . Alcohol Use:  No    Current Outpatient Prescriptions  Medication Sig Dispense Refill  . aspirin 325 MG EC tablet Take 325 mg by mouth daily.      . carvedilol (COREG) 6.25 MG tablet Take 1 tablet (6.25 mg total) by mouth 2 (two) times daily.  180 tablet  0  . doxycycline (DORYX) 100 MG DR capsule Take 100 mg by mouth daily.       . furosemide (LASIX) 40 MG tablet Take 1 tablet (40 mg total) by mouth every other day.  90 tablet  0  . insulin lispro (HUMALOG) 100 UNIT/ML injection Inject 60 Units into the skin 3 (three) times daily before meals.      . insulin NPH (HUMULIN N) 100 UNIT/ML injection Inject 50 Units into the skin at bedtime.  5 vial  3  .  isosorbide mononitrate (IMDUR) 30 MG 24 hr tablet Take 1 tablet (30 mg total) by mouth daily.  90 tablet  0  . Multiple Vitamin (MULITIVITAMIN WITH MINERALS) TABS Take 1 tablet by mouth daily.      Marland Kitchen PARoxetine (PAXIL) 20 MG tablet Take 20 mg by mouth daily.       . simvastatin (ZOCOR) 20 MG tablet Take 1 tablet (20 mg total) by mouth at bedtime.  90 tablet  0   No current facility-administered medications for this visit.    Allergies  Allergen Reactions  . Clopidogrel Bisulfate Hives  . Codeine Nausea And Vomiting  . Hydralazine Hives  . Morphine Nausea And Vomiting    Review of Systems no fevers or drainage from the sternal incision no angina BP 154/68  Pulse 75  Resp 16  Ht 5\' 5"  (1.651 m)  Wt 175 lb (79.379 kg)  BMI 29.12 kg/m2  SpO2 97%  LMP 01/05/1996 Physical Exam Alert and comfortable Breath sounds clear and equal Cardiac rhythm regular Sternal incision well-healed without tenderness, mild erythema at central sternum. Left anterior chest wall somewhat tender without deformity, fluctuance or instability \  Diagnostic Tests: No chest x-ray done today  Impression: Doing well with resolution of superficial sternal wound infection. Continue oral doxycycline 100 mg a day for persistent erythema. We'll see back in 3 months for repeat CT of chest.  Plan: Return in 3 months with CTA chest Continue doxycycline 100 mg daily

## 2012-07-16 NOTE — Telephone Encounter (Signed)
Patient came by the office, says we need to place order for NPH x 90 day supply. Uses mail in pharmacy [facility], Fax# [phone] / One[facility] Kras.

## 2012-07-16 NOTE — Telephone Encounter (Signed)
rx sent

## 2012-07-23 ENCOUNTER — Other Ambulatory Visit: Payer: Self-pay | Admitting: Cardiology

## 2012-07-23 MED ORDER — SIMVASTATIN 20 MG PO TABS: 20.0000 mg | ORAL_TABLET | Freq: Every day | ORAL | Status: DC

## 2012-07-23 MED ORDER — ISOSORBIDE MONONITRATE ER 30 MG PO TB24: 30.0000 mg | ORAL_TABLET | Freq: Every day | ORAL | Status: DC

## 2012-07-23 MED ORDER — FUROSEMIDE 40 MG PO TABS: 40.0000 mg | ORAL_TABLET | ORAL | Status: DC

## 2012-07-25 ENCOUNTER — Ambulatory Visit: Payer: 59 | Admitting: Cardiology

## 2012-07-29 ENCOUNTER — Other Ambulatory Visit: Payer: Self-pay

## 2012-07-29 MED ORDER — INSULIN NPH (HUMAN) (ISOPHANE) 100 UNIT/ML ~~LOC~~ SUSP: 50.0000 [IU] | Freq: Every day | SUBCUTANEOUS | Status: DC

## 2012-09-11 ENCOUNTER — Ambulatory Visit: Payer: 59 | Admitting: Cardiology

## 2012-09-25 ENCOUNTER — Other Ambulatory Visit: Payer: Self-pay

## 2012-09-25 DIAGNOSIS — D381 Neoplasm of uncertain behavior of trachea, bronchus and lung: Secondary | ICD-10-CM

## 2012-10-06 ENCOUNTER — Other Ambulatory Visit: Payer: Self-pay | Admitting: *Deleted

## 2012-10-06 DIAGNOSIS — Z951 Presence of aortocoronary bypass graft: Secondary | ICD-10-CM

## 2012-10-07 ENCOUNTER — Ambulatory Visit: Payer: 59 | Admitting: Endocrinology

## 2012-10-14 ENCOUNTER — Ambulatory Visit: Payer: Self-pay | Admitting: Cardiology

## 2012-10-15 ENCOUNTER — Ambulatory Visit: Payer: Medicare FFS | Admitting: Cardiothoracic Surgery

## 2012-10-15 ENCOUNTER — Other Ambulatory Visit: Payer: Medicare FFS

## 2012-10-16 ENCOUNTER — Ambulatory Visit: Payer: Self-pay | Admitting: Endocrinology

## 2012-10-29 ENCOUNTER — Ambulatory Visit (INDEPENDENT_AMBULATORY_CARE_PROVIDER_SITE_OTHER): Payer: Medicare FFS | Admitting: Endocrinology

## 2012-10-29 ENCOUNTER — Encounter: Payer: Self-pay | Admitting: Endocrinology

## 2012-10-29 VITALS — BP 134/80 | HR 68 | Ht 65.0 in | Wt 176.0 lb

## 2012-10-29 DIAGNOSIS — E1149 Type 2 diabetes mellitus with other diabetic neurological complication: Secondary | ICD-10-CM

## 2012-10-29 DIAGNOSIS — E1142 Type 2 diabetes mellitus with diabetic polyneuropathy: Secondary | ICD-10-CM

## 2012-10-29 MED ORDER — INSULIN NPH (HUMAN) (ISOPHANE) 100 UNIT/ML ~~LOC~~ SUSP: 60.0000 [IU] | Freq: Every day | SUBCUTANEOUS | Status: DC

## 2012-10-29 MED ORDER — INSULIN REGULAR HUMAN 100 UNIT/ML IJ SOLN: 70.0000 [IU] | Freq: Three times a day (TID) | INTRAMUSCULAR | Status: DC

## 2012-10-29 NOTE — Patient Instructions (Addendum)
check your blood sugar twice a day.  vary the time of day when you check, between before the 3 meals, and at bedtime.  also check if you have symptoms of your blood sugar being too high or too low.  please keep a record of the readings and bring it to your next appointment here.  please call us sooner if your blood sugar goes below 70, or if you have a lot of readings over 200.   Please come back for a follow-up appointment in 1 month. Please increase the NPH to 60 units at bedtime only Please also change the humalog to Regular, 70 units 3 times a day (just before each meal).

## 2012-10-29 NOTE — Progress Notes (Signed)
Subjective:    Patient ID: [name], female    DOB: 22-Jan-1951, 62 y.o.   MRN: [id number]  HPI pt returns for f/u of insulin-requiring DM (dx'ed 1998; she has moderate neuropathy of the lower extremities; she has associated CVA, CAD, and PAD; she has been on insulin since soon after dx). no cbg record, but states it varies from 225-300.  There is no trend throughout the day.  She wants to change the humalog to regular, to save money.   Past Medical History  Diagnosis Date  . Coronary atherosclerosis of native coronary artery     BMS circ 2001, DES LAD 2005, DES LAD and RCA 2008, subsequent CABG  . Drug allergy     Allergy to Plavix (hives) - took Ticlid in past  . Mixed hyperlipidemia   . Type 2 diabetes mellitus   . Rheumatoid arthritis(714.0)   . Depression   . Peripheral neuropathy   . Peptic ulcer disease ~ 1975  . Nephrolithiasis 02/07/2011  . Breast cancer     Chemo and mastectomy - right  . Chronic systolic heart failure     LVEF 40-45%  . Anxiety   . Sternal wound infection     CABG  . Stroke 07/2011    Past Surgical History  Procedure Laterality Date  . Mastectomy  1988    right  . Cholecystectomy  1969  . Abdominal hysterectomy  1989  . Tonsillectomy and adenoidectomy  1979  . Appendectomy  1969  . Tubal ligation  1972  . Coronary artery bypass graft  01/11/2011    Procedure: CORONARY ARTERY BYPASS GRAFTING (CABG);  Surgeon: Kathlee Nations Suann Larry, MD;  Location: [facility] OR;  Service: Open Heart Surgery;  Laterality: N/A;  cabg x four, using left internal mammary artery and right leg greater saphenous vein harvestede endoscopically  . Sternal wound debridement  03/01/2011    Procedure: STERNAL WOUND DEBRIDEMENT;  Surgeon: Kathlee Nations Suann Larry, MD;  Location: [facility] OR;  Service: Open Heart Surgery;  Laterality: N/A;  . Application of wound vac  03/01/2011    Procedure: APPLICATION OF WOUND VAC;  Surgeon: Kathlee Nations Suann Larry, MD;  Location: [facility] OR;  Service: Open Heart  Surgery;  Laterality: N/A;  . Breast surgery    . Sternal wires removal  04/02/2011    Procedure: STERNAL WIRES REMOVAL;  Surgeon: Kathlee Nations Suann Larry, MD;  Location: [facility] OR;  Service: Open Heart Surgery;  Laterality: N/A;  . Incision and drainage of wound  12/06/2011    Procedure: IRRIGATION AND DEBRIDEMENT WOUND;  Surgeon: Wayland Denis, DO;  Location: [facility] OR;  Service: Plastics;  Laterality: N/A;  IRRIGATION AND DEBRIDEMENT OF STERNAL ULCER AND PLACEMENT OF ACELL     History   Social History  . Marital Status: Married    Spouse Name: N/A    Number of Children: N/A  . Years of Education: N/A   Occupational History  . Not on file.   Social History Main Topics  . Smoking status: Former Smoker -- 1.00 packs/day for 20 years    Types: Cigarettes    Quit date: 02/27/2007  . Smokeless tobacco: Never Used  . Alcohol Use: No  . Drug Use: No  . Sexual Activity: Yes    Birth Control/ Protection: Post-menopausal   Other Topics Concern  . Not on file   Social History Narrative  . No narrative on file    Current Outpatient Prescriptions on File Prior to Visit  Medication  Sig Dispense Refill  . aspirin 325 MG EC tablet Take 325 mg by mouth daily.      . carvedilol (COREG) 6.25 MG tablet Take 1 tablet (6.25 mg total) by mouth 2 (two) times daily.  180 tablet  0  . doxycycline (DORYX) 100 MG DR capsule Take 100 mg by mouth daily.       . furosemide (LASIX) 40 MG tablet Take 1 tablet (40 mg total) by mouth every other day.  90 tablet  1  . isosorbide mononitrate (IMDUR) 30 MG 24 hr tablet Take 1 tablet (30 mg total) by mouth daily.  90 tablet  1  . Multiple Vitamin (MULITIVITAMIN WITH MINERALS) TABS Take 1 tablet by mouth daily.      Marland Kitchen PARoxetine (PAXIL) 20 MG tablet Take 20 mg by mouth daily.       . simvastatin (ZOCOR) 20 MG tablet Take 1 tablet (20 mg total) by mouth at bedtime.  90 tablet  1   No current facility-administered medications on file prior to visit.    Allergies   Allergen Reactions  . Clopidogrel Bisulfate Hives  . Codeine Nausea And Vomiting  . Hydralazine Hives  . Morphine Nausea And Vomiting    Family History  Problem Relation Age of Onset  . Anesthesia problems Neg Hx     BP 134/80  Pulse 68  Ht 5\' 5"  (1.651 m)  Wt 176 lb (79.833 kg)  BMI 29.29 kg/m2  SpO2 95%  LMP 01/05/1996  Review of Systems denies hypoglycemia and weight change.     Objective:   Physical Exam VITAL SIGNS:  See vs page GENERAL: no distress     Assessment & Plan:  DM: pt requests cheaper insulin.  This insulin regimen was chosen from multiple options, as it best matches his insulin to his changing requirements throughout the day.  The benefits of glycemic control must be weighed against the risks of hypoglycemia.  She needs increased rx H/O CVA: in this setting, she should avoid hypoglycemia. Depression: this often complicates the rx of DM, but it seems to be well-controlled now.

## 2012-10-30 ENCOUNTER — Encounter: Payer: Self-pay | Admitting: Cardiology

## 2012-10-30 NOTE — Progress Notes (Signed)
Rescheduled This encounter was created in error - please disregard. 

## 2012-11-05 ENCOUNTER — Ambulatory Visit
Admission: RE | Admit: 2012-11-05 | Discharge: 2012-11-05 | Disposition: A | Discharge status: Home / Self Care | Payer: Medicare FFS | Source: Ambulatory Visit | Attending: Cardiothoracic Surgery | Admitting: Cardiothoracic Surgery

## 2012-11-05 ENCOUNTER — Ambulatory Visit: Payer: Medicare FFS | Admitting: Cardiothoracic Surgery

## 2012-11-05 DIAGNOSIS — D381 Neoplasm of uncertain behavior of trachea, bronchus and lung: Secondary | ICD-10-CM

## 2012-11-05 DIAGNOSIS — R911 Solitary pulmonary nodule: Secondary | ICD-10-CM

## 2012-11-05 LAB — CREATININE, SERUM: Creat: 1.1 mg/dL (ref 0.50–1.10)

## 2012-11-05 LAB — BUN: BUN: 21 mg/dL (ref 6–23)

## 2012-11-05 MED ORDER — IOHEXOL 300 MG/ML  SOLN
75.0000 mL | Freq: Once | INTRAMUSCULAR | Status: AC | PRN
  Administered 2012-11-05: 75 mL via INTRAVENOUS

## 2012-11-13 ENCOUNTER — Ambulatory Visit (INDEPENDENT_AMBULATORY_CARE_PROVIDER_SITE_OTHER): Payer: Medicare FFS | Admitting: Cardiology

## 2012-11-13 ENCOUNTER — Encounter: Payer: Self-pay | Admitting: Cardiology

## 2012-11-13 ENCOUNTER — Ambulatory Visit: Payer: Self-pay | Admitting: Cardiology

## 2012-11-13 VITALS — BP 132/74 | HR 71 | Ht 65.0 in | Wt 178.1 lb

## 2012-11-13 DIAGNOSIS — I2510 Atherosclerotic heart disease of native coronary artery without angina pectoris: Secondary | ICD-10-CM

## 2012-11-13 DIAGNOSIS — I255 Ischemic cardiomyopathy: Secondary | ICD-10-CM

## 2012-11-13 DIAGNOSIS — I779 Disorder of arteries and arterioles, unspecified: Secondary | ICD-10-CM

## 2012-11-13 DIAGNOSIS — E785 Hyperlipidemia, unspecified: Secondary | ICD-10-CM

## 2012-11-13 DIAGNOSIS — I2589 Other forms of chronic ischemic heart disease: Secondary | ICD-10-CM

## 2012-11-13 MED ORDER — CARVEDILOL 6.25 MG PO TABS: 6.2500 mg | ORAL_TABLET | Freq: Two times a day (BID) | ORAL | Status: DC

## 2012-11-13 NOTE — Assessment & Plan Note (Signed)
She continues on Zocor. Reports having recent followup lab work. LDL was optimal by last assessment.

## 2012-11-13 NOTE — Assessment & Plan Note (Signed)
Multivessel disease status post CABG. Symptomatically stable. Continue medical therapy and observation.

## 2012-11-13 NOTE — Patient Instructions (Addendum)
Your physician recommends that you schedule a follow-up appointment in: 6 MONTHS 

## 2012-11-13 NOTE — Progress Notes (Signed)
Clinical Summary [name] is a 62 y.o.female last seen in February. She states that she has been doing reasonably well, staying active with her ADLs. No angina or unusual shortness of breath. No problems with orthopnea or PND. She indicates compliance with her medications.  She continues to follow with Dr. Everardo All and Dr. Donata Clay. Notes reviewed.  Weight is relatively stable over last few months. 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 LDL was optimally controlled from 2013 labs. She reports having recent lab work done in [place].   Allergies  Allergen Reactions  . Clopidogrel Bisulfate Hives  . Codeine Nausea And Vomiting  . Hydralazine Hives  . Morphine Nausea And Vomiting    Current Outpatient Prescriptions  Medication Sig Dispense Refill  . aspirin 325 MG EC tablet Take 325 mg by mouth daily.      . carvedilol (COREG) 6.25 MG tablet Take 1 tablet (6.25 mg total) by mouth 2 (two) times daily.  180 tablet  1  . Cholecalciferol (VITAMIN D PO) Take 5,000 Units by mouth daily.      Marland Kitchen doxycycline (DORYX) 100 MG DR capsule Take 100 mg by mouth daily.       . furosemide (LASIX) 40 MG tablet Take 1 tablet (40 mg total) by mouth every other day.  90 tablet  1  . insulin NPH (HUMULIN N,NOVOLIN N) 100 UNIT/ML injection Inject 60 Units into the skin at bedtime.  2 vial  11  . insulin regular (HUMULIN R) 100 units/mL injection Inject 0.7 mLs (70 Units total) into the skin 3 (three) times daily before meals.  70 mL  12  . isosorbide mononitrate (IMDUR) 30 MG 24 hr tablet Take 1 tablet (30 mg total) by mouth daily.  90 tablet  1  . Multiple Vitamin (MULITIVITAMIN WITH MINERALS) TABS Take 1 tablet by mouth daily.      . NON FORMULARY Joint Supplement BID      . PARoxetine (PAXIL) 20 MG tablet Take 20 mg by mouth daily.       . simvastatin (ZOCOR) 20 MG tablet Take 1 tablet (20 mg total) by mouth at bedtime.  90 tablet  1   No current facility-administered medications for this  visit.    Past Medical History  Diagnosis Date  . Coronary atherosclerosis of native coronary artery     BMS circ 2001, DES LAD 2005, DES LAD and RCA 2008, subsequent CABG  . Drug allergy     Allergy to Plavix (hives) - took Ticlid in past  . Mixed hyperlipidemia   . Type 2 diabetes mellitus   . Rheumatoid arthritis(714.0)   . Depression   . Peripheral neuropathy   . Peptic ulcer disease ~ 1975  . Nephrolithiasis 02/07/2011  . Breast cancer     Chemo and mastectomy - right  . Chronic systolic heart failure     LVEF 40-45%  . Anxiety   . Sternal wound infection     CABG  . Stroke 07/2011    Past Surgical History  Procedure Laterality Date  . Mastectomy  1988    right  . Cholecystectomy  1969  . Abdominal hysterectomy  1989  . Tonsillectomy and adenoidectomy  1979  . Appendectomy  1969  . Tubal ligation  1972  . Coronary artery bypass graft  01/11/2011    Procedure: CORONARY ARTERY BYPASS GRAFTING (CABG);  Surgeon: Kathlee Nations Suann Larry, MD;  Location: [facility] OR;  Service: Open Heart Surgery;  Laterality:  N/A;  cabg x four, using left internal mammary artery and right leg greater saphenous vein harvestede endoscopically  . Sternal wound debridement  03/01/2011    Procedure: STERNAL WOUND DEBRIDEMENT;  Surgeon: Kathlee Nations Suann Larry, MD;  Location: [facility] OR;  Service: Open Heart Surgery;  Laterality: N/A;  . Application of wound vac  03/01/2011    Procedure: APPLICATION OF WOUND VAC;  Surgeon: Kathlee Nations Suann Larry, MD;  Location: [facility] OR;  Service: Open Heart Surgery;  Laterality: N/A;  . Breast surgery    . Sternal wires removal  04/02/2011    Procedure: STERNAL WIRES REMOVAL;  Surgeon: Kathlee Nations Suann Larry, MD;  Location: [facility] OR;  Service: Open Heart Surgery;  Laterality: N/A;  . Incision and drainage of wound  12/06/2011    Procedure: IRRIGATION AND DEBRIDEMENT WOUND;  Surgeon: Wayland Denis, DO;  Location: [facility] OR;  Service: Plastics;  Laterality: N/A;  IRRIGATION AND DEBRIDEMENT OF STERNAL  ULCER AND PLACEMENT OF ACELL     Social History [name] reports that she quit smoking about 5 years ago. Her smoking use included Cigarettes. She has a 20 pack-year smoking history. She has never used smokeless tobacco. [name] reports that she does not drink alcohol.  Review of Systems No palpitations, no orthopnea or PND. Stable appetite. No fevers or chills. Otherwise negative.  Physical Examination Filed Vitals:   11/13/12 1321  BP: 132/74  Pulse: 71   Filed Weights   11/13/12 1321  Weight: 178 lb 1.3 oz (80.777 kg)    Appears comfortable. HEENT: Conjunctiva and lids normal, oropharynx clear.  Neck: Supple, no elevated JVP or carotid bruits, no thyromegaly.  Lungs: Clear to auscultation, diminished at bases, nonlabored breathing at rest.  Cardiac: Regular rate and rhythm, no S3 or pericardial rub.   Extremities: No pitting edema, distal pulses1+.  Skin: Warm and dry.    Problem List and Plan   Coronary atherosclerosis of native coronary artery Multivessel disease status post CABG. Symptomatically stable. Continue medical therapy and observation.  Ischemic cardiomyopathy LVEF 40-45%, no active heart failure symptoms. Refill provided for Coreg.  HYPERLIPIDEMIA-MIXED She continues on Zocor. Reports having recent followup lab work. LDL was optimal by last assessment.    Jonelle Sidle, M.D., F.A.C.C.

## 2012-11-13 NOTE — Assessment & Plan Note (Signed)
LVEF 40-45%, no active heart failure symptoms. Refill provided for Coreg.

## 2012-12-01 ENCOUNTER — Ambulatory Visit: Payer: Medicare FFS | Admitting: Endocrinology

## 2012-12-08 ENCOUNTER — Ambulatory Visit: Payer: Medicare FFS | Admitting: Endocrinology

## 2012-12-10 ENCOUNTER — Encounter: Payer: Self-pay | Admitting: Cardiothoracic Surgery

## 2012-12-10 ENCOUNTER — Encounter: Payer: Self-pay | Admitting: Endocrinology

## 2012-12-10 ENCOUNTER — Ambulatory Visit (INDEPENDENT_AMBULATORY_CARE_PROVIDER_SITE_OTHER): Payer: Medicare FFS | Admitting: Endocrinology

## 2012-12-10 ENCOUNTER — Ambulatory Visit (INDEPENDENT_AMBULATORY_CARE_PROVIDER_SITE_OTHER): Payer: Medicare FFS | Admitting: Cardiothoracic Surgery

## 2012-12-10 VITALS — BP 124/80 | HR 68 | Wt 177.0 lb

## 2012-12-10 VITALS — BP 117/62 | HR 67 | Resp 20 | Ht 65.0 in | Wt 177.0 lb

## 2012-12-10 DIAGNOSIS — I2510 Atherosclerotic heart disease of native coronary artery without angina pectoris: Secondary | ICD-10-CM

## 2012-12-10 DIAGNOSIS — T889XXS Complication of surgical and medical care, unspecified, sequela: Secondary | ICD-10-CM

## 2012-12-10 DIAGNOSIS — Z09 Encounter for follow-up examination after completed treatment for conditions other than malignant neoplasm: Secondary | ICD-10-CM

## 2012-12-10 DIAGNOSIS — Z951 Presence of aortocoronary bypass graft: Secondary | ICD-10-CM

## 2012-12-10 DIAGNOSIS — E1149 Type 2 diabetes mellitus with other diabetic neurological complication: Secondary | ICD-10-CM

## 2012-12-10 DIAGNOSIS — E1142 Type 2 diabetes mellitus with diabetic polyneuropathy: Secondary | ICD-10-CM

## 2012-12-10 NOTE — Patient Instructions (Addendum)
check your blood sugar twice a day.  vary the time of day when you check, between before the 3 meals, and at bedtime.  also check if you have symptoms of your blood sugar being too high or too low.  please keep a record of the readings and bring it to your next appointment here.  please call us sooner if your blood sugar goes below 70, or if you have a lot of readings over 200.   Please come back for a follow-up appointment in 1 month. Please increase the NPH to 80 units at bedtime only.   Please also increase the Regular to 90 units 3 times a day (just before each meal).   you should take the regular insulin just only when you are about to eat.  If a meal is missed or significantly delayed, your blood sugar could go low.

## 2012-12-10 NOTE — Progress Notes (Signed)
PCP is Romero Belling, MD Referring Provider is Romero Belling, MD  Chief Complaint  Patient presents with  . Routine Post Op    3 month f/u with CTA Chest    HPI: 3 month followup of sternal wound superficial infection with MRSA Patient has been on maintenance dose of 100 mg of minocycline daily No sternal drainage or erythema--persistent low-level musculoskeletal pain Patient recently evaluated by her cardiologist as well as her endocrinologist with good reports No fever weight slightly increased no angina  CT of chest shows the sternal incision to be intact and healed. No at risk pulmonary nodules or mediastinal adenopathy   Past Medical History  Diagnosis Date  . Coronary atherosclerosis of native coronary artery     BMS circ 2001, DES LAD 2005, DES LAD and RCA 2008, subsequent CABG  . Drug allergy     Allergy to Plavix (hives) - took Ticlid in past  . Mixed hyperlipidemia   . Type 2 diabetes mellitus   . Rheumatoid arthritis(714.0)   . Depression   . Peripheral neuropathy   . Peptic ulcer disease ~ 1975  . Nephrolithiasis 02/07/2011  . Breast cancer     Chemo and mastectomy - right  . Chronic systolic heart failure     LVEF 40-45%  . Anxiety   . Sternal wound infection     CABG  . Stroke 07/2011    Past Surgical History  Procedure Laterality Date  . Mastectomy  1988    right  . Cholecystectomy  1969  . Abdominal hysterectomy  1989  . Tonsillectomy and adenoidectomy  1979  . Appendectomy  1969  . Tubal ligation  1972  . Coronary artery bypass graft  01/11/2011    Procedure: CORONARY ARTERY BYPASS GRAFTING (CABG);  Surgeon: Kathlee Nations Suann Larry, MD;  Location: [facility] OR;  Service: Open Heart Surgery;  Laterality: N/A;  cabg x four, using left internal mammary artery and right leg greater saphenous vein harvestede endoscopically  . Sternal wound debridement  03/01/2011    Procedure: STERNAL WOUND DEBRIDEMENT;  Surgeon: Kathlee Nations Suann Larry, MD;  Location: [facility] OR;  Service:  Open Heart Surgery;  Laterality: N/A;  . Application of wound vac  03/01/2011    Procedure: APPLICATION OF WOUND VAC;  Surgeon: Kathlee Nations Suann Larry, MD;  Location: [facility] OR;  Service: Open Heart Surgery;  Laterality: N/A;  . Breast surgery    . Sternal wires removal  04/02/2011    Procedure: STERNAL WIRES REMOVAL;  Surgeon: Kathlee Nations Suann Larry, MD;  Location: [facility] OR;  Service: Open Heart Surgery;  Laterality: N/A;  . Incision and drainage of wound  12/06/2011    Procedure: IRRIGATION AND DEBRIDEMENT WOUND;  Surgeon: Wayland Denis, DO;  Location: [facility] OR;  Service: Plastics;  Laterality: N/A;  IRRIGATION AND DEBRIDEMENT OF STERNAL ULCER AND PLACEMENT OF ACELL     Family History  Problem Relation Age of Onset  . Anesthesia problems Neg Hx     Social History History  Substance Use Topics  . Smoking status: Former Smoker -- 1.00 packs/day for 20 years    Types: Cigarettes    Quit date: 02/27/2007  . Smokeless tobacco: Never Used  . Alcohol Use: No    Current Outpatient Prescriptions  Medication Sig Dispense Refill  . aspirin 325 MG EC tablet Take 325 mg by mouth daily.      . carvedilol (COREG) 6.25 MG tablet Take 1 tablet (6.25 mg total) by mouth 2 (two) times daily.  180 tablet  1  . Cholecalciferol (VITAMIN D PO) Take 5,000 Units by mouth daily.      . furosemide (LASIX) 40 MG tablet Take 1 tablet (40 mg total) by mouth every other day.  90 tablet  1  . insulin NPH (HUMULIN N,NOVOLIN N) 100 UNIT/ML injection Inject 80 Units into the skin at bedtime.      . insulin regular (NOVOLIN R,HUMULIN R) 100 units/mL injection Inject 90 Units into the skin 3 (three) times daily before meals.      . isosorbide mononitrate (IMDUR) 30 MG 24 hr tablet Take 1 tablet (30 mg total) by mouth daily.  90 tablet  1  . Multiple Vitamin (MULITIVITAMIN WITH MINERALS) TABS Take 1 tablet by mouth daily.      . NON FORMULARY Joint Supplement BID      . PARoxetine (PAXIL) 20 MG tablet Take 20 mg by mouth daily.       .  simvastatin (ZOCOR) 20 MG tablet Take 1 tablet (20 mg total) by mouth at bedtime.  90 tablet  1   No current facility-administered medications for this visit.    Allergies  Allergen Reactions  . Clopidogrel Bisulfate Hives  . Codeine Nausea And Vomiting  . Hydralazine Hives  . Morphine Nausea And Vomiting    Review of Systems tries to walk daily but is having troubles arthritis of her knees no symptoms of angina or CHF  BP 117/62  Pulse 67  Resp 20  Ht 5\' 5"  (1.651 m)  Wt 177 lb (80.287 kg)  BMI 29.45 kg/m2  SpO2 98%  LMP 01/05/1996 Physical Exam Alert and comfortable Heart rate regular without murmur Sternal incision well-healed skin intact and normal appearance No pedal edema Lungs clear  Diagnostic Tests: CT scan results reviewed with patient and husband  Impression: Healed sternal incision. Patient remains on maintenance minocycline 100 mg daily. Continue this for another 3 months then stop  Plan: Review patient in 3-4 months to discuss discontinuation of daily 100 mg doxycycline

## 2012-12-10 NOTE — Progress Notes (Signed)
Subjective:    Patient ID: [name], female    DOB: [birth year], 62 y.o.   MRN: [id number]  HPI pt returns for f/u of insulin-requiring DM (dx'ed 1998, on a routine blood test; she has moderate neuropathy of the lower extremities, and associated CVA, CAD, and PAD; she has been on insulin since 1999; she has never had severe hypoglycemia or DKA).  no cbg record, but states cbg's are approx 200.  There is no trend throughout the day.  She seldom has hypoglycemia, and these are mild.  This happens when she take reg insulin, but does not eat.   She says depression is well-controlled.   Past Medical History  Diagnosis Date  . Coronary atherosclerosis of native coronary artery     BMS circ 2001, DES LAD 2005, DES LAD and RCA 2008, subsequent CABG  . Drug allergy     Allergy to Plavix (hives) - took Ticlid in past  . Mixed hyperlipidemia   . Type 2 diabetes mellitus   . Rheumatoid arthritis(714.0)   . Depression   . Peripheral neuropathy   . Peptic ulcer disease ~ 1975  . Nephrolithiasis 02/07/2011  . Breast cancer     Chemo and mastectomy - right  . Chronic systolic heart failure     LVEF 40-45%  . Anxiety   . Sternal wound infection     CABG  . Stroke 07/2011    Past Surgical History  Procedure Laterality Date  . Mastectomy  1988    right  . Cholecystectomy  1969  . Abdominal hysterectomy  1989  . Tonsillectomy and adenoidectomy  1979  . Appendectomy  1969  . Tubal ligation  1972  . Coronary artery bypass graft  01/11/2011    Procedure: CORONARY ARTERY BYPASS GRAFTING (CABG);  Surgeon: Kathlee Nations Suann Larry, MD;  Location: [facility] OR;  Service: Open Heart Surgery;  Laterality: N/A;  cabg x four, using left internal mammary artery and right leg greater saphenous vein harvestede endoscopically  . Sternal wound debridement  03/01/2011    Procedure: STERNAL WOUND DEBRIDEMENT;  Surgeon: Kathlee Nations Suann Larry, MD;  Location: [facility] OR;  Service: Open Heart Surgery;  Laterality: N/A;  .  Application of wound vac  03/01/2011    Procedure: APPLICATION OF WOUND VAC;  Surgeon: Kathlee Nations Suann Larry, MD;  Location: [facility] OR;  Service: Open Heart Surgery;  Laterality: N/A;  . Breast surgery    . Sternal wires removal  04/02/2011    Procedure: STERNAL WIRES REMOVAL;  Surgeon: Kathlee Nations Suann Larry, MD;  Location[facility] OR;  Service: Open Heart Surgery;  Laterality: N/A;  . Incision and drainage of wound  12/06/2011    Procedure: IRRIGATION AND DEBRIDEMENT WOUND;  Surgeon: Wayland Denis, DO;  Location: [facility] OR;  Service: Plastics;  Laterality: N/A;  IRRIGATION AND DEBRIDEMENT OF STERNAL ULCER AND PLACEMENT OF ACELL     History   Social History  . Marital Status: Married    Spouse Name: N/A    Number of Children: N/A  . Years of Education: N/A   Occupational History  . Not on file.   Social History Main Topics  . Smoking status: Former Smoker -- 1.00 packs/day for 20 years    Types: Cigarettes    Quit date: 02/27/2007  . Smokeless tobacco: Never Used  . Alcohol Use: No  . Drug Use: No  . Sexual Activity: Yes    Birth Control/ Protection: Post-menopausal   Other Topics Concern  .  Not on file   Social History Narrative  . No narrative on file    Current Outpatient Prescriptions on File Prior to Visit  Medication Sig Dispense Refill  . aspirin 325 MG EC tablet Take 325 mg by mouth daily.      . carvedilol (COREG) 6.25 MG tablet Take 1 tablet (6.25 mg total) by mouth 2 (two) times daily.  180 tablet  1  . Cholecalciferol (VITAMIN D PO) Take 5,000 Units by mouth daily.      Marland Kitchen doxycycline (DORYX) 100 MG DR capsule Take 100 mg by mouth daily.       . furosemide (LASIX) 40 MG tablet Take 1 tablet (40 mg total) by mouth every other day.  90 tablet  1  . insulin NPH (HUMULIN N,NOVOLIN N) 100 UNIT/ML injection Inject 60 Units into the skin at bedtime.  2 vial  11  . insulin regular (HUMULIN R) 100 units/mL injection Inject 0.7 mLs (70 Units total) into the skin 3 (three) times daily before  meals.  70 mL  12  . isosorbide mononitrate (IMDUR) 30 MG 24 hr tablet Take 1 tablet (30 mg total) by mouth daily.  90 tablet  1  . Multiple Vitamin (MULITIVITAMIN WITH MINERALS) TABS Take 1 tablet by mouth daily.      . NON FORMULARY Joint Supplement BID      . PARoxetine (PAXIL) 20 MG tablet Take 20 mg by mouth daily.       . simvastatin (ZOCOR) 20 MG tablet Take 1 tablet (20 mg total) by mouth at bedtime.  90 tablet  1   No current facility-administered medications on file prior to visit.   Allergies  Allergen Reactions  . Clopidogrel Bisulfate Hives  . Codeine Nausea And Vomiting  . Hydralazine Hives  . Morphine Nausea And Vomiting   Family History  Problem Relation Age of Onset  . Anesthesia problems Neg Hx    BP 124/80  Pulse 68  Wt 177 lb (80.287 kg)  BMI 29.45 kg/m2  SpO2 95%  LMP 01/05/1996  Review of Systems Denies LOC and weight change    Objective:   Physical Exam VITAL SIGNS:  See vs page GENERAL: no distress PSYCH: Alert and oriented x 3.  Does not appear anxious nor depressed.  Lab Results  Component Value Date   HGBA1C 10.0* 07/07/2012      Assessment & Plan:  DM: This insulin regimen was chosen from multiple options, as it best matches her insulin to her changing requirements throughout the day.  The benefits of glycemic control must be weighed against the risks of hypoglycemia.  She needs increased rx H/O CVA: in this setting, she should avoid hypoglycemia. Depression: this often complicates the rx of DM, but it seems to be well-controlled now.

## 2012-12-29 ENCOUNTER — Other Ambulatory Visit: Payer: Self-pay | Admitting: *Deleted

## 2012-12-29 MED ORDER — ACCU-CHEK NANO SMARTVIEW W/DEVICE KIT: 1.0000 | PACK | Freq: Two times a day (BID) | Status: DC

## 2012-12-29 MED ORDER — GLUCOSE BLOOD VI STRP: ORAL_STRIP | Status: DC

## 2012-12-29 MED ORDER — ACCU-CHEK FASTCLIX LANCETS MISC: 1.0000 | Freq: Two times a day (BID) | Status: AC

## 2013-01-01 ENCOUNTER — Other Ambulatory Visit: Payer: Self-pay

## 2013-01-26 ENCOUNTER — Ambulatory Visit: Payer: Medicare FFS | Admitting: Endocrinology

## 2013-02-12 ENCOUNTER — Other Ambulatory Visit: Payer: Self-pay | Admitting: Adult Health

## 2013-04-01 ENCOUNTER — Ambulatory Visit: Payer: Medicare FFS | Admitting: Cardiothoracic Surgery

## 2013-04-08 ENCOUNTER — Ambulatory Visit (INDEPENDENT_AMBULATORY_CARE_PROVIDER_SITE_OTHER): Payer: Medicare FFS | Admitting: Cardiothoracic Surgery

## 2013-04-08 ENCOUNTER — Encounter: Payer: Self-pay | Admitting: Cardiothoracic Surgery

## 2013-04-08 VITALS — BP 148/78 | HR 72 | Resp 20 | Ht 65.0 in | Wt 180.0 lb

## 2013-04-08 DIAGNOSIS — T8132XA Disruption of internal operation (surgical) wound, not elsewhere classified, initial encounter: Secondary | ICD-10-CM

## 2013-04-08 DIAGNOSIS — I2510 Atherosclerotic heart disease of native coronary artery without angina pectoris: Secondary | ICD-10-CM

## 2013-04-08 DIAGNOSIS — Z951 Presence of aortocoronary bypass graft: Secondary | ICD-10-CM

## 2013-04-08 NOTE — Progress Notes (Signed)
PCP is Renato Shin, MD Referring Provider is Renato Shin, MD  Chief Complaint  Patient presents with  . Routine Post Op    4 month f/u wound check    HPI: 3 month followup of of the superficial following CABG sternal wound infection in 2012   patient currently off suppressive dose of minocycline 100 mg daily. Sternal wound completely healed without drainage or ulceration. However patient has persistent left anterior rib pain. She also complains of some shortness of breath with exertion. She denies angina. She states her blood sugars usually stay around 200. No complaints of ankle edema, orthopnea or PND.     Past Medical History  Diagnosis Date  . Coronary atherosclerosis of native coronary artery     BMS circ 2001, DES LAD 2005, DES LAD and RCA 2008, subsequent CABG  . Drug allergy     Allergy to Plavix (hives) - took Ticlid in past  . Mixed hyperlipidemia   . Type 2 diabetes mellitus   . Rheumatoid arthritis(714.0)   . Depression   . Peripheral neuropathy   . Peptic ulcer disease ~ 1975  . Nephrolithiasis 02/07/2011  . Breast cancer     Chemo and mastectomy - right  . Chronic systolic heart failure     LVEF 40-45%  . Anxiety   . Sternal wound infection     CABG  . Stroke 07/2011    Past Surgical History  Procedure Laterality Date  . Mastectomy  1988    right  . Cholecystectomy  1969  . Abdominal hysterectomy  1989  . Tonsillectomy and adenoidectomy  1979  . Appendectomy  1969  . Tubal ligation  1972  . Coronary artery bypass graft  01/11/2011    Procedure: CORONARY ARTERY BYPASS GRAFTING (CABG);  Surgeon: Tharon Aquas Adelene Idler, MD;  Location: [place];  Service: Open Heart Surgery;  Laterality: N/A;  cabg x four, using left internal mammary artery and right leg greater saphenous vein harvestede endoscopically  . Sternal wound debridement  03/01/2011    Procedure: STERNAL WOUND DEBRIDEMENT;  Surgeon: Tharon Aquas Adelene Idler, MD;  Location: [place];  Service: Open Heart  Surgery;  Laterality: N/A;  . Application of wound vac  03/01/2011    Procedure: APPLICATION OF WOUND VAC;  Surgeon: Tharon Aquas Adelene Idler, MD;  Location: [place];  Service: Open Heart Surgery;  Laterality: N/A;  . Breast surgery    . Sternal wires removal  04/02/2011    Procedure: STERNAL WIRES REMOVAL;  Surgeon: Tharon Aquas Adelene Idler, MD;  Location: [place];  Service: Open Heart Surgery;  Laterality: N/A;  . Incision and drainage of wound  12/06/2011    Procedure: IRRIGATION AND DEBRIDEMENT WOUND;  Surgeon: Theodoro Kos, DO;  Location: [place];  Service: Plastics;  Laterality: N/A;  IRRIGATION AND DEBRIDEMENT OF STERNAL ULCER AND PLACEMENT OF ACELL     Family History  Problem Relation Age of Onset  . Anesthesia problems Neg Hx     Social History History  Substance Use Topics  . Smoking status: Former Smoker -- 1.00 packs/day for 20 years    Types: Cigarettes    Quit date: 02/27/2007  . Smokeless tobacco: Never Used  . Alcohol Use: No    Current Outpatient Prescriptions  Medication Sig Dispense Refill  . ACCU-CHEK FASTCLIX LANCETS MISC 1 each by Does not apply route 2 (two) times daily.  200 each  1  . aspirin 325 MG EC tablet Take 325 mg by mouth daily.      Marland Kitchen  Blood Glucose Monitoring Suppl (ACCU-CHEK NANO SMARTVIEW) W/DEVICE KIT 1 each by Does not apply route 2 (two) times daily.  1 kit  0  . carvedilol (COREG) 6.25 MG tablet Take 1 tablet (6.25 mg total) by mouth 2 (two) times daily.  180 tablet  1  . Cholecalciferol (VITAMIN D PO) Take 5,000 Units by mouth daily.      . furosemide (LASIX) 40 MG tablet Take 1 tablet (40 mg total) by mouth every other day.  90 tablet  1  . glucose blood (ACCU-CHEK SMARTVIEW) test strip Use as instructed to check blood sugars 2 times per day dx code 250.02  100 each  12  . insulin NPH (HUMULIN N,NOVOLIN N) 100 UNIT/ML injection Inject 80 Units into the skin at bedtime.      . insulin regular (NOVOLIN R,HUMULIN R) 100 units/mL injection Inject 90 Units into  the skin 3 (three) times daily before meals.      . isosorbide mononitrate (IMDUR) 30 MG 24 hr tablet TAKE 1 TABLET DAILY.  90 tablet  2  . Multiple Vitamin (MULITIVITAMIN WITH MINERALS) TABS Take 1 tablet by mouth daily.      Marland Kitchen PARoxetine (PAXIL) 20 MG tablet Take 20 mg by mouth daily.       . simvastatin (ZOCOR) 20 MG tablet Take 1 tablet (20 mg total) by mouth at bedtime.  90 tablet  1   No current facility-administered medications for this visit.    Allergies  Allergen Reactions  . Clopidogrel Bisulfate Hives  . Codeine Nausea And Vomiting  . Hydralazine Hives  . Morphine Nausea And Vomiting    Review of Systemspatient complains of Gen. loss and energy and being drowsy. She usually takes only Tylenol for rib pain.   BP 148/78  Pulse 72  Resp 20  Ht $R'5\' 5"'Hu$  (1.651 m)  Wt 180 lb (81.647 kg)  BMI 29.95 kg/m2  SpO2 95%  LMP 01/05/1996 Physical Exam  alert and comfortable   sternum completely healed but still tender. No erythema or edema Tenderness along the left anterior fourth rib without palpable deformity Lungs clear Heart rate regular without murmur or gallop No pedal edema  Diagnostic Tests None   sternal incision is completely healed looks great. The skin is intact and healthy. However she has persistent left anterior rib pain. We'll check bone scan. Dyspnea on exertion appears to be a problem so we'll check echocardiogram-evaluation by her  cardiologist Dr. Rozann Lesches in [place]: Last cath demonstrated patent grafts but that was about 2 years ago   we'll check bone scan to evaluate rib pain and 2-D echocardiogram to evaluate LV function

## 2013-04-09 ENCOUNTER — Other Ambulatory Visit: Payer: Self-pay | Admitting: *Deleted

## 2013-04-09 DIAGNOSIS — R0781 Pleurodynia: Secondary | ICD-10-CM

## 2013-04-09 DIAGNOSIS — R0600 Dyspnea, unspecified: Secondary | ICD-10-CM

## 2013-04-16 ENCOUNTER — Ambulatory Visit (HOSPITAL_COMMUNITY): Admission: RE | Admit: 2013-04-16 | Payer: Medicare FFS | Source: Ambulatory Visit

## 2013-04-24 ENCOUNTER — Other Ambulatory Visit: Payer: Self-pay | Admitting: Adult Health

## 2013-04-24 ENCOUNTER — Other Ambulatory Visit: Payer: Self-pay | Admitting: Cardiology

## 2013-04-24 ENCOUNTER — Ambulatory Visit (INDEPENDENT_AMBULATORY_CARE_PROVIDER_SITE_OTHER): Payer: Medicare FFS | Admitting: Cardiology

## 2013-04-24 ENCOUNTER — Encounter: Payer: Self-pay | Admitting: Cardiology

## 2013-04-24 VITALS — BP 165/61 | HR 72 | Ht 65.0 in | Wt 179.0 lb

## 2013-04-24 DIAGNOSIS — I10 Essential (primary) hypertension: Secondary | ICD-10-CM

## 2013-04-24 DIAGNOSIS — R0989 Other specified symptoms and signs involving the circulatory and respiratory systems: Secondary | ICD-10-CM

## 2013-04-24 DIAGNOSIS — R0609 Other forms of dyspnea: Secondary | ICD-10-CM

## 2013-04-24 DIAGNOSIS — R0602 Shortness of breath: Secondary | ICD-10-CM | POA: Insufficient documentation

## 2013-04-24 DIAGNOSIS — I2510 Atherosclerotic heart disease of native coronary artery without angina pectoris: Secondary | ICD-10-CM

## 2013-04-24 MED ORDER — LOSARTAN POTASSIUM 25 MG PO TABS: 25.0000 mg | ORAL_TABLET | Freq: Every day | ORAL | Status: DC

## 2013-04-24 NOTE — Progress Notes (Signed)
Clinical Summary [name] is a 63 y.o.female last seen in September 2014. She has had recent followup with Dr. Prescott Gum for chronic sternal wound infection, has been on minocycline suppressive therapy. She was felt to be doing fairly well from the perspective of her wound healing. She has had some rib pain and has pending further workup for this.   Shortness of breath was also mentioned, and it was suggested that she followup for an echocardiogram. This is not yet been completed - pending for March. She tells her that she has been weak, feels tired and sleepy. She was on a cruise recently and states that she slept most of the time. She feels chest soreness, pointing to the left side of her rib cage, but no definitive angina. Reports NYHA class III dyspnea at times.  We reviewed her medications. She reports compliance. Blood pressure has been trending up.   Allergies  Allergen Reactions  . Clopidogrel Bisulfate Hives  . Codeine Nausea And Vomiting  . Hydralazine Hives  . Morphine Nausea And Vomiting    Current Outpatient Prescriptions  Medication Sig Dispense Refill  . ACCU-CHEK FASTCLIX LANCETS MISC 1 each by Does not apply route 2 (two) times daily.  200 each  1  . aspirin 325 MG EC tablet Take 325 mg by mouth daily.      . Blood Glucose Monitoring Suppl (ACCU-CHEK NANO SMARTVIEW) W/DEVICE KIT 1 each by Does not apply route 2 (two) times daily.  1 kit  0  . carvedilol (COREG) 6.25 MG tablet TAKE 1 TABLET TWICE DAILY  180 tablet  3  . furosemide (LASIX) 40 MG tablet Take 1 tablet (40 mg total) by mouth every other day.  90 tablet  1  . glucose blood (ACCU-CHEK SMARTVIEW) test strip Use as instructed to check blood sugars 2 times per day dx code 250.02  100 each  12  . insulin NPH (HUMULIN N,NOVOLIN N) 100 UNIT/ML injection Inject 80 Units into the skin at bedtime.      . insulin regular (NOVOLIN R,HUMULIN R) 100 units/mL injection Inject 90 Units into the skin 3 (three) times daily  before meals.      . isosorbide mononitrate (IMDUR) 30 MG 24 hr tablet TAKE 1 TABLET DAILY.  90 tablet  2  . Multiple Vitamin (MULITIVITAMIN WITH MINERALS) TABS Take 1 tablet by mouth daily.      Marland Kitchen PARoxetine (PAXIL) 20 MG tablet Take 20 mg by mouth daily.       . simvastatin (ZOCOR) 20 MG tablet TAKE 1 TABLET AT BEDTIME  90 tablet  3  . losartan (COZAAR) 25 MG tablet Take 1 tablet (25 mg total) by mouth daily.  90 tablet  3   No current facility-administered medications for this visit.    Past Medical History  Diagnosis Date  . Coronary atherosclerosis of native coronary artery     BMS circ 2001, DES LAD 2005, DES LAD and RCA 2008, subsequent CABG  . Drug allergy     Allergy to Plavix (hives) - took Ticlid in past  . Mixed hyperlipidemia   . Type 2 diabetes mellitus   . Rheumatoid arthritis(714.0)   . Depression   . Peripheral neuropathy   . Peptic ulcer disease ~ 1975  . Nephrolithiasis 02/07/2011  . Breast cancer     Chemo and mastectomy - right  . Chronic systolic heart failure     LVEF 40-45%  . Anxiety   . Sternal wound infection  CABG  . Stroke 07/2011    Past Surgical History  Procedure Laterality Date  . Mastectomy  1988    right  . Cholecystectomy  1969  . Abdominal hysterectomy  1989  . Tonsillectomy and adenoidectomy  1979  . Appendectomy  1969  . Tubal ligation  1972  . Coronary artery bypass graft  01/11/2011    Procedure: CORONARY ARTERY BYPASS GRAFTING (CABG);  Surgeon: Tharon Aquas Adelene Idler, MD;  Location: [place];  Service: Open Heart Surgery;  Laterality: N/A;  cabg x four, using left internal mammary artery and right leg greater saphenous vein harvestede endoscopically  . Sternal wound debridement  03/01/2011    Procedure: STERNAL WOUND DEBRIDEMENT;  Surgeon: Tharon Aquas Adelene Idler, MD;  Location: [place];  Service: Open Heart Surgery;  Laterality: N/A;  . Application of wound vac  03/01/2011    Procedure: APPLICATION OF WOUND VAC;  Surgeon: Tharon Aquas Adelene Idler, MD;  Location: [place];  Service: Open Heart Surgery;  Laterality: N/A;  . Breast surgery    . Sternal wires removal  04/02/2011    Procedure: STERNAL WIRES REMOVAL;  Surgeon: Tharon Aquas Adelene Idler, MD;  Location: [place];  Service: Open Heart Surgery;  Laterality: N/A;  . Incision and drainage of wound  12/06/2011    Procedure: IRRIGATION AND DEBRIDEMENT WOUND;  Surgeon: Theodoro Kos, DO;  Location: [place];  Service: Plastics;  Laterality: N/A;  IRRIGATION AND DEBRIDEMENT OF STERNAL ULCER AND PLACEMENT OF ACELL     Social History [name] reports that she quit smoking about 6 years ago. Her smoking use included Cigarettes. She has a 20 pack-year smoking history. She has never used smokeless tobacco. [name] reports that she does not drink alcohol.  Review of Systems No palpitations or syncope. No orthopnea or PND. No leg edema. Otherwise as outlined.  Physical Examination Filed Vitals:   04/24/13 1445  BP: 165/61  Pulse: 72   Filed Weights   04/24/13 1445  Weight: 179 lb (81.194 kg)    Appears comfortable.  HEENT: Conjunctiva and lids normal, oropharynx clear.  Neck: Supple, no elevated JVP or carotid bruits, no thyromegaly.  Lungs: Clear to auscultation, diminished at bases, nonlabored breathing at rest.  Cardiac: Regular rate and rhythm, no S3 or pericardial rub.  Extremities: No pitting edema, distal pulses1+.  Skin: Warm and dry.  Musculoskeletal: No kyphosis. Neuropsychiatric: Alert and oriented x3, affect appropriate.   Problem List and Plan   Coronary atherosclerosis of native coronary artery Multivessel disease status post CABG in November 2012. She has had shortness of breath but no definite angina symptoms. Prior history of cardiomyopathy with last LVEF approximately 40%. Continue current regimen, add Cozaar 25 mg daily. She will need a followup BMET in a few weeks. Echocardiogram pending.  Shortness of breath Echocardiogram pending for followup assessment of  LVEF. ARB being added to medical regimen with prior history of cardiomyopathy. Followup BMET, CBC also in the next few weeks. Followup arranged to discuss the next step.  Essential hypertension, benign Blood pressure is up. Medications being adjusted as noted.    Satira Sark, M.D., F.A.C.C.

## 2013-04-24 NOTE — Assessment & Plan Note (Signed)
Multivessel disease status post CABG in November 2012. She has had shortness of breath but no definite angina symptoms. Prior history of cardiomyopathy with last LVEF approximately 40%. Continue current regimen, add Cozaar 25 mg daily. She will need a followup BMET in a few weeks. Echocardiogram pending.

## 2013-04-24 NOTE — Assessment & Plan Note (Signed)
Echocardiogram pending for followup assessment of LVEF. ARB being added to medical regimen with prior history of cardiomyopathy. Followup BMET, CBC also in the next few weeks. Followup arranged to discuss the next step.

## 2013-04-24 NOTE — Patient Instructions (Signed)
Your physician recommends that you schedule a follow-up appointment in: 2-3 weeks   Please START Cozaar 25 mg daily   We have rescheduled your Echocardiogram for Tuesday,  04/28/13 AT 2 PM HERE AT [facility]     Please HAVE BLOOD WORK (bmet,cbc) JUST PRIOR TO FOLLOW UP with Dr.[name]    Thank you for choosing [facility] !

## 2013-04-24 NOTE — Assessment & Plan Note (Signed)
Blood pressure is up. Medications being adjusted as noted.

## 2013-04-27 ENCOUNTER — Ambulatory Visit (INDEPENDENT_AMBULATORY_CARE_PROVIDER_SITE_OTHER): Payer: Medicare FFS | Admitting: Endocrinology

## 2013-04-27 ENCOUNTER — Encounter: Payer: Self-pay | Admitting: Endocrinology

## 2013-04-27 VITALS — BP 118/74 | HR 73 | Temp 98.2°F | Ht 65.0 in | Wt 180.0 lb

## 2013-04-27 DIAGNOSIS — E1149 Type 2 diabetes mellitus with other diabetic neurological complication: Secondary | ICD-10-CM

## 2013-04-27 DIAGNOSIS — E1142 Type 2 diabetes mellitus with diabetic polyneuropathy: Secondary | ICD-10-CM

## 2013-04-27 MED ORDER — INSULIN REGULAR HUMAN 100 UNIT/ML IJ SOLN: 100.0000 [IU] | Freq: Three times a day (TID) | INTRAMUSCULAR | Status: DC

## 2013-04-27 NOTE — Patient Instructions (Addendum)
check your blood sugar twice a day.  vary the time of day when you check, between before the 3 meals, and at bedtime.  also check if you have symptoms of your blood sugar being too high or too low.  please keep a record of the readings and bring it to your next appointment here.  please call us sooner if your blood sugar goes below 70, or if you have a lot of readings over 200.   Please come back for a follow-up appointment in 1 month.  Please increase the NPH to 100 units at bedtime only.   Please also increase the Regular to 100 units 3 times a day (just before each meal).   you should take the regular insulin just only when you are about to eat.  If a meal is missed or significantly delayed, your blood sugar could go low.

## 2013-04-27 NOTE — Progress Notes (Signed)
Subjective:    Patient ID: [name], female    DOB: 12-16-1950, 63 y.o.   MRN: [id number]  HPI pt returns for f/u of insulin-requiring DM (dx'ed 1998, on a routine blood test; she has moderate neuropathy of the lower extremities, and associated CVA, CAD, and PAD; she has been on insulin since 1999; she has never had severe hypoglycemia or DKA; she takes multiple daily injections: she takes human insulin due to cost).  no cbg record, but states cbg's are still approx 200.  There is no trend throughout the day.    Past Medical History  Diagnosis Date  . Coronary atherosclerosis of native coronary artery     BMS circ 2001, DES LAD 2005, DES LAD and RCA 2008, subsequent CABG  . Drug allergy     Allergy to Plavix (hives) - took Ticlid in past  . Mixed hyperlipidemia   . Type 2 diabetes mellitus   . Rheumatoid arthritis(714.0)   . Depression   . Peripheral neuropathy   . Peptic ulcer disease ~ 1975  . Nephrolithiasis 02/07/2011  . Breast cancer     Chemo and mastectomy - right  . Chronic systolic heart failure     LVEF 40-45%  . Anxiety   . Sternal wound infection     CABG  . Stroke 07/2011    Past Surgical History  Procedure Laterality Date  . Mastectomy  1988    right  . Cholecystectomy  1969  . Abdominal hysterectomy  1989  . Tonsillectomy and adenoidectomy  1979  . Appendectomy  1969  . Tubal ligation  1972  . Coronary artery bypass graft  01/11/2011    Procedure: CORONARY ARTERY BYPASS GRAFTING (CABG);  Surgeon: Tharon Aquas Adelene Idler, MD;  Location: [place];  Service: Open Heart Surgery;  Laterality: N/A;  cabg x four, using left internal mammary artery and right leg greater saphenous vein harvestede endoscopically  . Sternal wound debridement  03/01/2011    Procedure: STERNAL WOUND DEBRIDEMENT;  Surgeon: Tharon Aquas Adelene Idler, MD;  Location: [place];  Service: Open Heart Surgery;  Laterality: N/A;  . Application of wound vac  03/01/2011    Procedure: APPLICATION OF WOUND VAC;   Surgeon: Tharon Aquas Adelene Idler, MD;  Location: [place];  Service: Open Heart Surgery;  Laterality: N/A;  . Breast surgery    . Sternal wires removal  04/02/2011    Procedure: STERNAL WIRES REMOVAL;  Surgeon: Tharon Aquas Adelene Idler, MD;  Location: [place];  Service: Open Heart Surgery;  Laterality: N/A;  . Incision and drainage of wound  12/06/2011    Procedure: IRRIGATION AND DEBRIDEMENT WOUND;  Surgeon: Theodoro Kos, DO;  Location: [place];  Service: Plastics;  Laterality: N/A;  IRRIGATION AND DEBRIDEMENT OF STERNAL ULCER AND PLACEMENT OF ACELL     History   Social History  . Marital Status: Married    Spouse Name: N/A    Number of Children: N/A  . Years of Education: N/A   Occupational History  . Not on file.   Social History Main Topics  . Smoking status: Former Smoker -- 1.00 packs/day for 20 years    Types: Cigarettes    Quit date: 02/27/2007  . Smokeless tobacco: Never Used  . Alcohol Use: No  . Drug Use: No  . Sexual Activity: Yes    Birth Control/ Protection: Post-menopausal   Other Topics Concern  . Not on file   Social History Narrative  . No narrative on  file    Current Outpatient Prescriptions on File Prior to Visit  Medication Sig Dispense Refill  . ACCU-CHEK FASTCLIX LANCETS MISC 1 each by Does not apply route 2 (two) times daily.  200 each  1  . aspirin 325 MG EC tablet Take 325 mg by mouth daily.      . Blood Glucose Monitoring Suppl (ACCU-CHEK NANO SMARTVIEW) W/DEVICE KIT 1 each by Does not apply route 2 (two) times daily.  1 kit  0  . carvedilol (COREG) 6.25 MG tablet TAKE 1 TABLET TWICE DAILY  180 tablet  3  . furosemide (LASIX) 40 MG tablet Take 1 tablet (40 mg total) by mouth every other day.  90 tablet  1  . glucose blood (ACCU-CHEK SMARTVIEW) test strip Use as instructed to check blood sugars 2 times per day dx code 250.02  100 each  12  . insulin NPH (HUMULIN N,NOVOLIN N) 100 UNIT/ML injection Inject 100 Units into the skin at bedtime.       . isosorbide  mononitrate (IMDUR) 30 MG 24 hr tablet TAKE 1 TABLET DAILY.  90 tablet  2  . losartan (COZAAR) 25 MG tablet Take 1 tablet (25 mg total) by mouth daily.  90 tablet  3  . Multiple Vitamin (MULITIVITAMIN WITH MINERALS) TABS Take 1 tablet by mouth daily.      Marland Kitchen PARoxetine (PAXIL) 20 MG tablet Take 20 mg by mouth daily.       . simvastatin (ZOCOR) 20 MG tablet TAKE 1 TABLET AT BEDTIME  90 tablet  3   No current facility-administered medications on file prior to visit.    Allergies  Allergen Reactions  . Clopidogrel Bisulfate Hives  . Codeine Nausea And Vomiting  . Hydralazine Hives  . Morphine Nausea And Vomiting    Family History  Problem Relation Age of Onset  . Anesthesia problems Neg Hx     BP 118/74  Pulse 73  Temp(Src) 98.2 F (36.8 C) (Oral)  Ht 5' 5" (1.651 m)  Wt 180 lb (81.647 kg)  BMI 29.95 kg/m2  SpO2 92%  LMP 01/05/1996  Review of Systems She denies hypoglycemia and weight change    Objective:   Physical Exam VITAL SIGNS:  See vs page GENERAL: no distress  Lab Results  Component Value Date   HGBA1C 9.9* 04/27/2013      Assessment & Plan:  DM: This insulin regimen was chosen from multiple options, as it best matches her insulin to her changing requirements throughout the day.  The benefits of glycemic control must be weighed against the risks of hypoglycemia.  She needs increased rx H/O CVA: in this setting, she should avoid hypoglycemia. Economic circumstances: this dictate that pt take human insulin.

## 2013-04-28 ENCOUNTER — Ambulatory Visit (HOSPITAL_COMMUNITY): Payer: Medicare FFS

## 2013-04-28 LAB — MICROALBUMIN / CREATININE URINE RATIO
Creatinine,U: 64.8 mg/dL
MICROALB UR: 1.7 mg/dL (ref 0.0–1.9)
Microalb Creat Ratio: 2.6 mg/g (ref 0.0–30.0)

## 2013-04-28 LAB — HEMOGLOBIN A1C: HEMOGLOBIN A1C: 9.9 % — AB (ref 4.6–6.5)

## 2013-04-29 ENCOUNTER — Ambulatory Visit (HOSPITAL_COMMUNITY)
Admission: RE | Admit: 2013-04-29 | Discharge: 2013-04-29 | Disposition: A | Discharge status: Home / Self Care | Payer: Medicare FFS | Source: Ambulatory Visit | Attending: Cardiology | Admitting: Cardiology

## 2013-04-29 DIAGNOSIS — Z87891 Personal history of nicotine dependence: Secondary | ICD-10-CM | POA: Insufficient documentation

## 2013-04-29 DIAGNOSIS — I2510 Atherosclerotic heart disease of native coronary artery without angina pectoris: Secondary | ICD-10-CM | POA: Insufficient documentation

## 2013-04-29 DIAGNOSIS — I10 Essential (primary) hypertension: Secondary | ICD-10-CM | POA: Insufficient documentation

## 2013-04-29 DIAGNOSIS — E119 Type 2 diabetes mellitus without complications: Secondary | ICD-10-CM | POA: Insufficient documentation

## 2013-04-29 DIAGNOSIS — I509 Heart failure, unspecified: Secondary | ICD-10-CM | POA: Insufficient documentation

## 2013-04-29 DIAGNOSIS — I779 Disorder of arteries and arterioles, unspecified: Secondary | ICD-10-CM

## 2013-04-29 DIAGNOSIS — Z951 Presence of aortocoronary bypass graft: Secondary | ICD-10-CM | POA: Insufficient documentation

## 2013-04-29 DIAGNOSIS — I739 Peripheral vascular disease, unspecified: Secondary | ICD-10-CM

## 2013-04-29 DIAGNOSIS — R0600 Dyspnea, unspecified: Secondary | ICD-10-CM

## 2013-04-29 DIAGNOSIS — Z853 Personal history of malignant neoplasm of breast: Secondary | ICD-10-CM | POA: Insufficient documentation

## 2013-04-29 DIAGNOSIS — I059 Rheumatic mitral valve disease, unspecified: Secondary | ICD-10-CM

## 2013-04-29 DIAGNOSIS — E785 Hyperlipidemia, unspecified: Secondary | ICD-10-CM | POA: Insufficient documentation

## 2013-04-29 DIAGNOSIS — R0602 Shortness of breath: Secondary | ICD-10-CM | POA: Insufficient documentation

## 2013-04-29 NOTE — Progress Notes (Signed)
*  PRELIMINARY RESULTS* Echocardiogram 2D Echocardiogram has been performed.  [place], [place] 04/29/2013, 2:34 PM

## 2013-05-12 ENCOUNTER — Telehealth: Payer: Self-pay

## 2013-05-12 MED ORDER — INSULIN REGULAR HUMAN 100 UNIT/ML IJ SOLN: 100.0000 [IU] | Freq: Three times a day (TID) | INTRAMUSCULAR | Status: DC

## 2013-05-12 NOTE — Telephone Encounter (Signed)
Done

## 2013-05-12 NOTE — Telephone Encounter (Signed)
ok 

## 2013-05-12 NOTE — Telephone Encounter (Signed)
Received fax from pharmacy stating that Novolin R is not covered. Preferred is Humulin R.  Ok to change? Thanks!

## 2013-05-14 ENCOUNTER — Encounter: Payer: Self-pay | Admitting: Cardiology

## 2013-05-14 ENCOUNTER — Ambulatory Visit (INDEPENDENT_AMBULATORY_CARE_PROVIDER_SITE_OTHER): Payer: Medicare FFS | Admitting: Cardiology

## 2013-05-14 VITALS — BP 143/61 | HR 68 | Ht 65.0 in | Wt 181.0 lb

## 2013-05-14 DIAGNOSIS — I2510 Atherosclerotic heart disease of native coronary artery without angina pectoris: Secondary | ICD-10-CM

## 2013-05-14 DIAGNOSIS — I255 Ischemic cardiomyopathy: Secondary | ICD-10-CM

## 2013-05-14 DIAGNOSIS — I2589 Other forms of chronic ischemic heart disease: Secondary | ICD-10-CM

## 2013-05-14 NOTE — Assessment & Plan Note (Signed)
No active angina symptoms status post CABG in November 2012. Continue medical therapy.

## 2013-05-14 NOTE — Progress Notes (Signed)
Clinical Summary [name] is a 63 y.o.female seen recently in February. Cozaar was added to her medical regimen at the last visit. She seems to be doing somewhat better, still has fatigue, but no progressive shortness of breath. She just came back from a cruise to the [place] in fact. No anginal chest pains.  Recent lab work shows hemoglobin A1c 9.9 (followed by Dr. Loanne Drilling), BMET pending. Followup echocardiogram in March showed moderate LVH with LVEF 50-55%, hypokinesis of the distal anteroseptal wall, grade 1 diastolic dysfunction, moderate left atrial enlargement, mildly thickened mitral valve with mild mitral regurgitation, mildly sclerotic aortic valve, trivial tricuspid regurgitation with normal PASP 26 mm mercury.  We discussed the results of her echocardiogram, in some ways better than the prior evaluation. Plan will be to continue medical therapy.   Allergies  Allergen Reactions  . Clopidogrel Bisulfate Hives  . Codeine Nausea And Vomiting  . Hydralazine Hives  . Morphine Nausea And Vomiting    Current Outpatient Prescriptions  Medication Sig Dispense Refill  . ACCU-CHEK FASTCLIX LANCETS MISC 1 each by Does not apply route 2 (two) times daily.  200 each  1  . aspirin 325 MG EC tablet Take 325 mg by mouth daily.      . Blood Glucose Monitoring Suppl (ACCU-CHEK NANO SMARTVIEW) W/DEVICE KIT 1 each by Does not apply route 2 (two) times daily.  1 kit  0  . carvedilol (COREG) 6.25 MG tablet TAKE 1 TABLET TWICE DAILY  180 tablet  3  . furosemide (LASIX) 40 MG tablet Take 1 tablet (40 mg total) by mouth every other day.  90 tablet  1  . glucose blood (ACCU-CHEK SMARTVIEW) test strip Use as instructed to check blood sugars 2 times per day dx code 250.02  100 each  12  . insulin NPH (HUMULIN N,NOVOLIN N) 100 UNIT/ML injection Inject 100 Units into the skin at bedtime.       . insulin regular (NOVOLIN R,HUMULIN R) 100 units/mL injection Inject 1 mL (100 Units total) into the skin 3  (three) times daily before meals.  30 vial  2  . isosorbide mononitrate (IMDUR) 30 MG 24 hr tablet TAKE 1 TABLET DAILY.  90 tablet  2  . losartan (COZAAR) 25 MG tablet Take 1 tablet (25 mg total) by mouth daily.  90 tablet  3  . Multiple Vitamin (MULITIVITAMIN WITH MINERALS) TABS Take 1 tablet by mouth daily.      Marland Kitchen PARoxetine (PAXIL) 20 MG tablet Take 20 mg by mouth daily.       . simvastatin (ZOCOR) 20 MG tablet TAKE 1 TABLET AT BEDTIME  90 tablet  3   No current facility-administered medications for this visit.    Past Medical History  Diagnosis Date  . Coronary atherosclerosis of native coronary artery     BMS circ 2001, DES LAD 2005, DES LAD and RCA 2008, subsequent CABG  . Drug allergy     Allergy to Plavix (hives) - took Ticlid in past  . Mixed hyperlipidemia   . Type 2 diabetes mellitus   . Rheumatoid arthritis(714.0)   . Depression   . Peripheral neuropathy   . Peptic ulcer disease ~ 1975  . Nephrolithiasis 02/07/2011  . Breast cancer     Chemo and mastectomy - right  . Chronic systolic heart failure     LVEF 40-45%  . Anxiety   . Sternal wound infection     CABG  . Stroke 07/2011  Past Surgical History  Procedure Laterality Date  . Mastectomy  1988    right  . Cholecystectomy  1969  . Abdominal hysterectomy  1989  . Tonsillectomy and adenoidectomy  1979  . Appendectomy  1969  . Tubal ligation  1972  . Coronary artery bypass graft  01/11/2011    Procedure: CORONARY ARTERY BYPASS GRAFTING (CABG);  Surgeon: Tharon Aquas Adelene Idler, MD;  Location: [place];  Service: Open Heart Surgery;  Laterality: N/A;  cabg x four, using left internal mammary artery and right leg greater saphenous vein harvestede endoscopically  . Sternal wound debridement  03/01/2011    Procedure: STERNAL WOUND DEBRIDEMENT;  Surgeon: Tharon Aquas Adelene Idler, MD;  Location: [place];  Service: Open Heart Surgery;  Laterality: N/A;  . Application of wound vac  03/01/2011    Procedure: APPLICATION OF WOUND VAC;   Surgeon: Tharon Aquas Adelene Idler, MD;  Location: [facility] [place];  Service: Open Heart Surgery;  Laterality: N/A;  . Breast surgery    . Sternal wires removal  04/02/2011    Procedure: STERNAL WIRES REMOVAL;  Surgeon: Tharon Aquas Adelene Idler, MD;  Location: [place];  Service: Open Heart Surgery;  Laterality: N/A;  . Incision and drainage of wound  12/06/2011    Procedure: IRRIGATION AND DEBRIDEMENT WOUND;  Surgeon: Theodoro Kos, DO;  Location: [place];  Service: Plastics;  Laterality: N/A;  IRRIGATION AND DEBRIDEMENT OF STERNAL ULCER AND PLACEMENT OF ACELL     Social History [name] reports that she quit smoking about 6 years ago. Her smoking use included Cigarettes. She has a 20 pack-year smoking history. She has never used smokeless tobacco. [name] reports that she does not drink alcohol.  Review of Systems No palpitations or syncope. Stable appetite. No orthopnea or PND. Otherwise negative except as outlined.  Physical Examination Filed Vitals:   05/14/13 0914  BP: 143/61  Pulse: 68   Filed Weights   05/14/13 0914  Weight: 181 lb (82.101 kg)    Appears comfortable.  HEENT: Conjunctiva and lids normal, oropharynx clear.  Neck: Supple, no elevated JVP or carotid bruits, no thyromegaly.  Lungs: Clear to auscultation, diminished at bases, nonlabored breathing at rest.  Cardiac: Regular rate and rhythm, no S3 or pericardial rub.  Extremities: No pitting edema, distal pulses1+.  Skin: Warm and dry.  Musculoskeletal: No kyphosis.  Neuropsychiatric: Alert and oriented x3, affect appropriate.   Problem List and Plan   Coronary atherosclerosis of native coronary artery No active angina symptoms status post CABG in November 2012. Continue medical therapy.  Ischemic cardiomyopathy LVEF now 50-55% with grade 1 diastolic dysfunction. This represents an improvement compared to prior evaluation.    Satira Sark, M.D., F.A.C.C.

## 2013-05-14 NOTE — Patient Instructions (Signed)
Your physician wants you to follow-up in: 4 months You will receive a reminder letter in the mail two months in advance. If you don't receive a letter, please call our office to schedule the follow-up appointment.   Your physician recommends that you continue on your current medications as directed. Please refer to the Current Medication list given to you today.      Thank you for choosing [facility] !         

## 2013-05-14 NOTE — Assessment & Plan Note (Signed)
LVEF now 50-55% with grade 1 diastolic dysfunction. This represents an improvement compared to prior evaluation.

## 2013-05-20 ENCOUNTER — Encounter: Payer: Self-pay | Admitting: Cardiothoracic Surgery

## 2013-05-20 ENCOUNTER — Ambulatory Visit (INDEPENDENT_AMBULATORY_CARE_PROVIDER_SITE_OTHER): Payer: Medicare FFS | Admitting: Cardiothoracic Surgery

## 2013-05-20 ENCOUNTER — Encounter (HOSPITAL_COMMUNITY)
Admission: RE | Admit: 2013-05-20 | Discharge: 2013-05-20 | Disposition: A | Discharge status: Home / Self Care | Payer: Medicare FFS | Source: Ambulatory Visit | Attending: Cardiothoracic Surgery | Admitting: Cardiothoracic Surgery

## 2013-05-20 ENCOUNTER — Ambulatory Visit (HOSPITAL_COMMUNITY)
Admission: RE | Admit: 2013-05-20 | Discharge: 2013-05-20 | Disposition: A | Discharge status: Home / Self Care | Payer: Medicare FFS | Source: Ambulatory Visit | Attending: Cardiothoracic Surgery | Admitting: Cardiothoracic Surgery

## 2013-05-20 VITALS — BP 122/59 | HR 68 | Resp 16 | Ht 65.0 in | Wt 181.0 lb

## 2013-05-20 DIAGNOSIS — R0989 Other specified symptoms and signs involving the circulatory and respiratory systems: Secondary | ICD-10-CM

## 2013-05-20 DIAGNOSIS — R0781 Pleurodynia: Secondary | ICD-10-CM

## 2013-05-20 DIAGNOSIS — Z853 Personal history of malignant neoplasm of breast: Secondary | ICD-10-CM | POA: Insufficient documentation

## 2013-05-20 DIAGNOSIS — R0600 Dyspnea, unspecified: Secondary | ICD-10-CM

## 2013-05-20 DIAGNOSIS — I2510 Atherosclerotic heart disease of native coronary artery without angina pectoris: Secondary | ICD-10-CM

## 2013-05-20 DIAGNOSIS — R079 Chest pain, unspecified: Secondary | ICD-10-CM

## 2013-05-20 DIAGNOSIS — R0609 Other forms of dyspnea: Secondary | ICD-10-CM

## 2013-05-20 DIAGNOSIS — Z951 Presence of aortocoronary bypass graft: Secondary | ICD-10-CM

## 2013-05-20 MED ORDER — TECHNETIUM TC 99M MEDRONATE IV KIT
25.0000 | PACK | Freq: Once | INTRAVENOUS | Status: AC | PRN
  Administered 2013-05-20: 25 via INTRAVENOUS

## 2013-05-20 NOTE — Progress Notes (Signed)
PCP is Renato Shin, MD Referring Provider is Renato Shin, MD  Chief Complaint  Patient presents with  . Routine Post Op    review bone scan 05/20/13 and recent 2D-Echo    HPI: The patient returns for further evaluation and treatment of chronic anterior chest wall pain after CABG followed by superficial sternal wound infection I'll by debridement and healing by secondary intent. The sternal incision is completely healed by physical exam and CT scan. The patient complains of some dyspnea on exertion. Recap after surgery demonstrated patent grafts.  Since her last visit the patient underwent a 2-D echocardiogram to assess LV function and a bone scan to detect possible osteomyelitis. The echocardiogram shows good LV function with EF of 50-55%. Bone scan shows no evidence of rib infection or metastatic disease-she has a previous history of breast cancer and modified radical mastectomy.   Past Medical History  Diagnosis Date  . Coronary atherosclerosis of native coronary artery     BMS circ 2001, DES LAD 2005, DES LAD and RCA 2008, subsequent CABG  . Drug allergy     Allergy to Plavix (hives) - took Ticlid in past  . Mixed hyperlipidemia   . Type 2 diabetes mellitus   . Rheumatoid arthritis(714.0)   . Depression   . Peripheral neuropathy   . Peptic ulcer disease ~ 1975  . Nephrolithiasis 02/07/2011  . Breast cancer     Chemo and mastectomy - right  . Chronic systolic heart failure     LVEF 40-45%  . Anxiety   . Sternal wound infection     CABG  . Stroke 07/2011    Past Surgical History  Procedure Laterality Date  . Mastectomy  1988    right  . Cholecystectomy  1969  . Abdominal hysterectomy  1989  . Tonsillectomy and adenoidectomy  1979  . Appendectomy  1969  . Tubal ligation  1972  . Coronary artery bypass graft  01/11/2011    Procedure: CORONARY ARTERY BYPASS GRAFTING (CABG);  Surgeon: Tharon Aquas Adelene Idler, MD;  Location: [place];  Service: Open Heart Surgery;  Laterality:  N/A;  cabg x four, using left internal mammary artery and right leg greater saphenous vein harvestede endoscopically  . Sternal wound debridement  03/01/2011    Procedure: STERNAL WOUND DEBRIDEMENT;  Surgeon: Tharon Aquas Adelene Idler, MD;  Location: [place];  Service: Open Heart Surgery;  Laterality: N/A;  . Application of wound vac  03/01/2011    Procedure: APPLICATION OF WOUND VAC;  Surgeon: Tharon Aquas Adelene Idler, MD;  Location: [place];  Service: Open Heart Surgery;  Laterality: N/A;  . Breast surgery    . Sternal wires removal  04/02/2011    Procedure: STERNAL WIRES REMOVAL;  Surgeon: Tharon Aquas Adelene Idler, MD;  Location: [place];  Service: Open Heart Surgery;  Laterality: N/A;  . Incision and drainage of wound  12/06/2011    Procedure: IRRIGATION AND DEBRIDEMENT WOUND;  Surgeon: Theodoro Kos, DO;  Location: [place];  Service: Plastics;  Laterality: N/A;  IRRIGATION AND DEBRIDEMENT OF STERNAL ULCER AND PLACEMENT OF ACELL     Family History  Problem Relation Age of Onset  . Anesthesia problems Neg Hx     Social History History  Substance Use Topics  . Smoking status: Former Smoker -- 1.00 packs/day for 20 years    Types: Cigarettes    Quit date: 02/27/2007  . Smokeless tobacco: Never Used  . Alcohol Use: No    Current Outpatient Prescriptions  Medication Sig Dispense Refill  . ACCU-CHEK FASTCLIX LANCETS MISC 1 each by Does not apply route 2 (two) times daily.  200 each  1  . aspirin 325 MG EC tablet Take 325 mg by mouth daily.      . Blood Glucose Monitoring Suppl (ACCU-CHEK NANO SMARTVIEW) W/DEVICE KIT 1 each by Does not apply route 2 (two) times daily.  1 kit  0  . carvedilol (COREG) 6.25 MG tablet TAKE 1 TABLET TWICE DAILY  180 tablet  3  . furosemide (LASIX) 40 MG tablet Take 1 tablet (40 mg total) by mouth every other day.  90 tablet  1  . glucose blood (ACCU-CHEK SMARTVIEW) test strip Use as instructed to check blood sugars 2 times per day dx code 250.02  100 each  12  . insulin NPH (HUMULIN  N,NOVOLIN N) 100 UNIT/ML injection Inject 100 Units into the skin at bedtime.       . insulin regular (NOVOLIN R,HUMULIN R) 100 units/mL injection Inject 1 mL (100 Units total) into the skin 3 (three) times daily before meals.  30 vial  2  . isosorbide mononitrate (IMDUR) 30 MG 24 hr tablet TAKE 1 TABLET DAILY.  90 tablet  2  . losartan (COZAAR) 25 MG tablet Take 1 tablet (25 mg total) by mouth daily.  90 tablet  3  . Multiple Vitamin (MULITIVITAMIN WITH MINERALS) TABS Take 1 tablet by mouth daily.      Marland Kitchen PARoxetine (PAXIL) 20 MG tablet Take 20 mg by mouth daily.       . simvastatin (ZOCOR) 20 MG tablet TAKE 1 TABLET AT BEDTIME  90 tablet  3   No current facility-administered medications for this visit.    Allergies  Allergen Reactions  . Clopidogrel Bisulfate Hives  . Codeine Nausea And Vomiting  . Hydralazine Hives  . Morphine Nausea And Vomiting    Review of Systems no fever or drainage from the incision no weight gain or peripheral edema  BP 122/59  Pulse 68  Resp 16  Ht 5' 5" (1.651 m)  Wt 181 lb (82.101 kg)  BMI 30.12 kg/m2  SpO2 97%  LMP 01/05/1996 Physical Exam Alert and comfortable Breath sounds clear Sternum well-healed but tenderness remains along the left side of the sternum. The left breast appears to be normal. The right breast is absent from previous mastectomy. Heart rhythm is regular without murmur  Diagnostic Tests: Results of 2-D echocardiogram and bone scan discussed with patient.  Impression: Neuritic-type pain from sternal incision followed by several debridements. Will place patient on Lyrica 75 mg each bedtime and monitor response. Plan: Return for review of pain and response to medication in 6 weeks

## 2013-05-28 ENCOUNTER — Other Ambulatory Visit: Payer: Self-pay

## 2013-05-28 MED ORDER — INSULIN REGULAR HUMAN 100 UNIT/ML IJ SOLN: 100.0000 [IU] | Freq: Three times a day (TID) | INTRAMUSCULAR | Status: DC

## 2013-06-05 ENCOUNTER — Telehealth: Payer: Self-pay | Admitting: Endocrinology

## 2013-06-05 NOTE — Telephone Encounter (Signed)
Called pt and lvm advising her that we did receive the PA for Novolin R. Advised her that Dr Loanne Drilling is on vacation and will return on Monday. We will fax the form as soon as he completes it and signs it.

## 2013-06-05 NOTE — Telephone Encounter (Signed)
Did we receive the novolin PA from [facility]

## 2013-06-17 ENCOUNTER — Other Ambulatory Visit: Payer: Self-pay | Admitting: Cardiothoracic Surgery

## 2013-06-17 DIAGNOSIS — Z1231 Encounter for screening mammogram for malignant neoplasm of breast: Secondary | ICD-10-CM

## 2013-06-23 ENCOUNTER — Ambulatory Visit (HOSPITAL_COMMUNITY)
Admission: RE | Admit: 2013-06-23 | Discharge: 2013-06-23 | Disposition: A | Discharge status: Home / Self Care | Payer: Medicare FFS | Source: Ambulatory Visit | Attending: Cardiothoracic Surgery | Admitting: Cardiothoracic Surgery

## 2013-06-23 DIAGNOSIS — Z1231 Encounter for screening mammogram for malignant neoplasm of breast: Secondary | ICD-10-CM | POA: Insufficient documentation

## 2013-06-24 ENCOUNTER — Ambulatory Visit: Payer: Medicare FFS | Admitting: Cardiothoracic Surgery

## 2013-06-27 IMAGING — CR DG CHEST 2V
2 series · 2 of 2 positions shown · non-contrast
Comparison: None.

CLINICAL DATA: Status post cardiac catheterization; severe weakness
and dyspnea.

CHEST - 2 VIEW

[w chest pa]
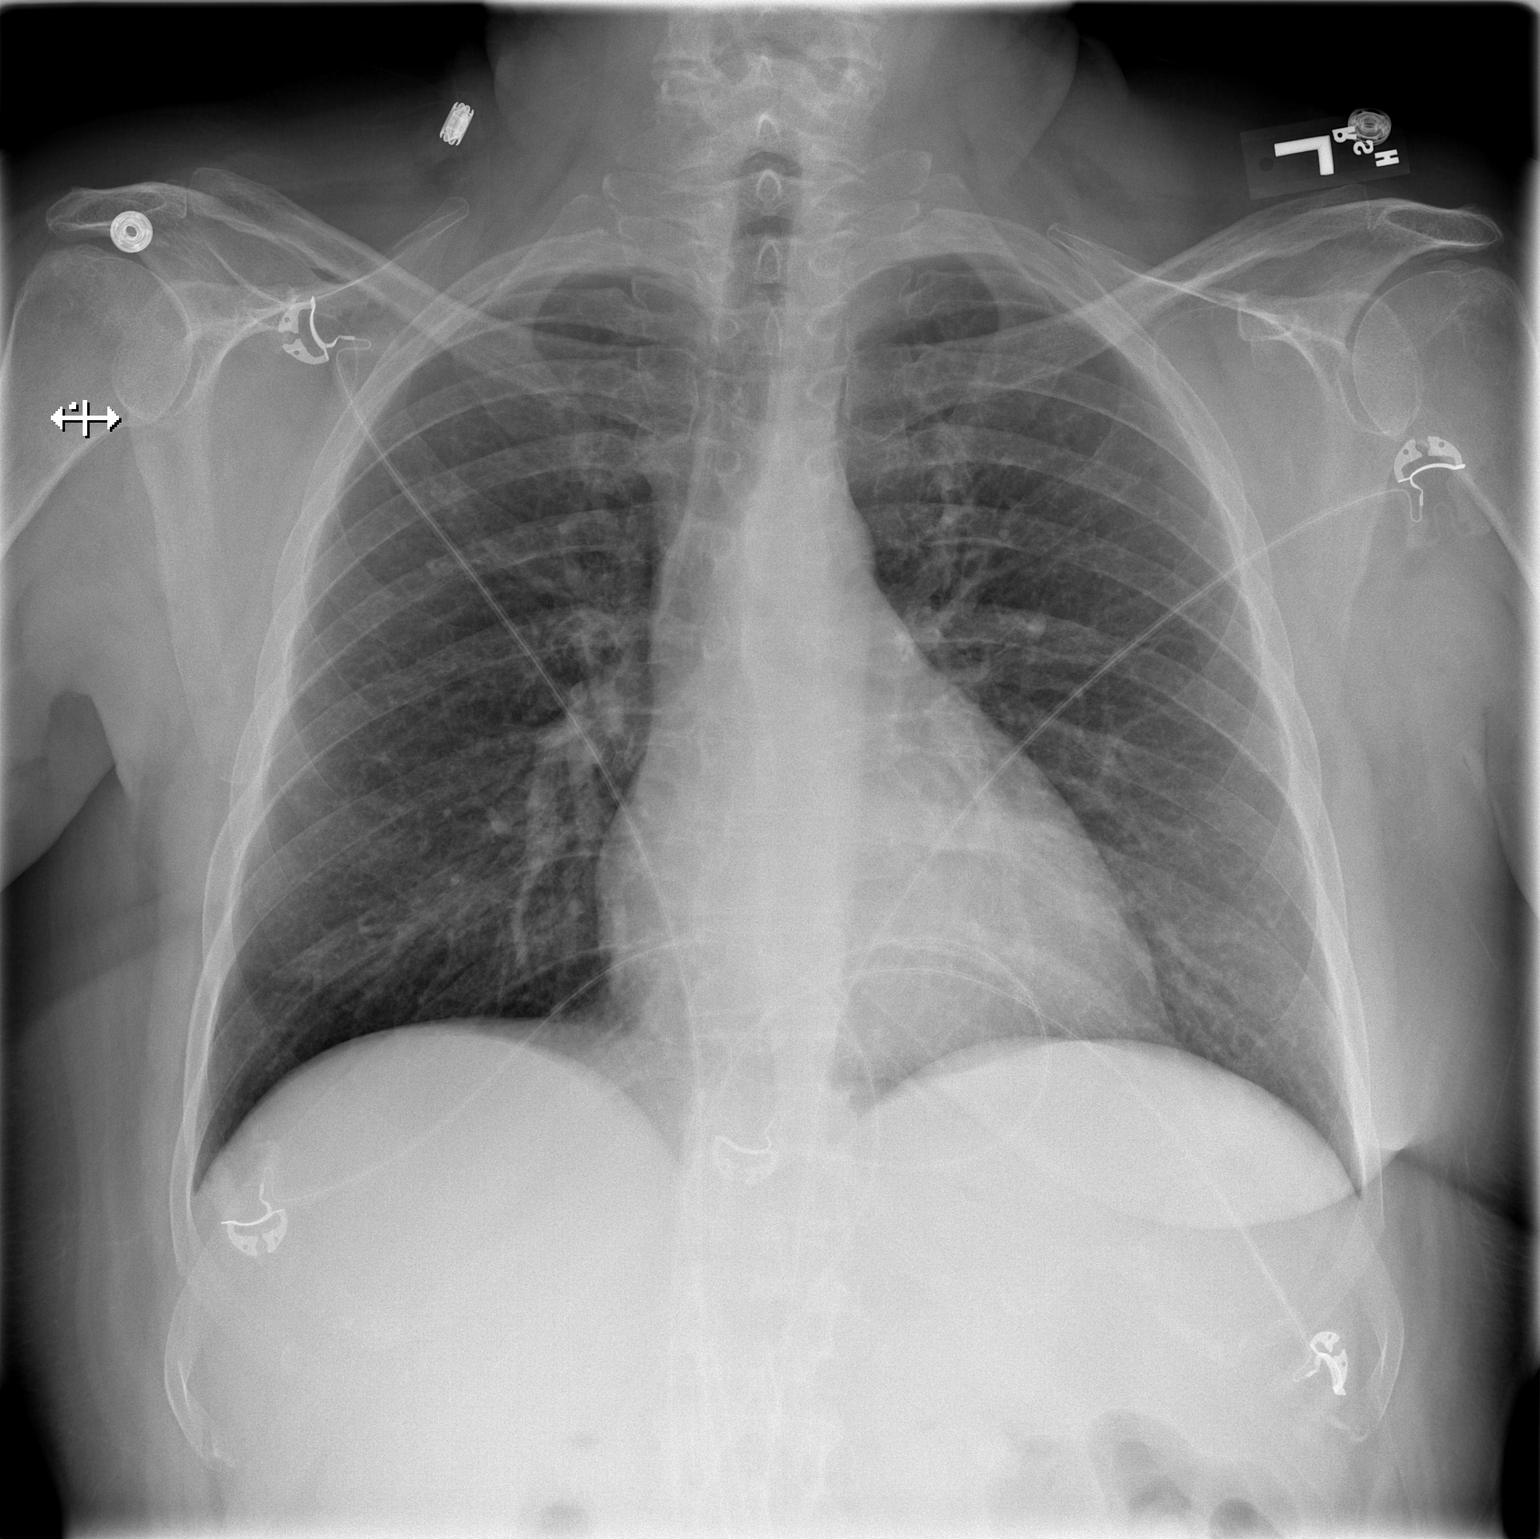

[w chest lat]
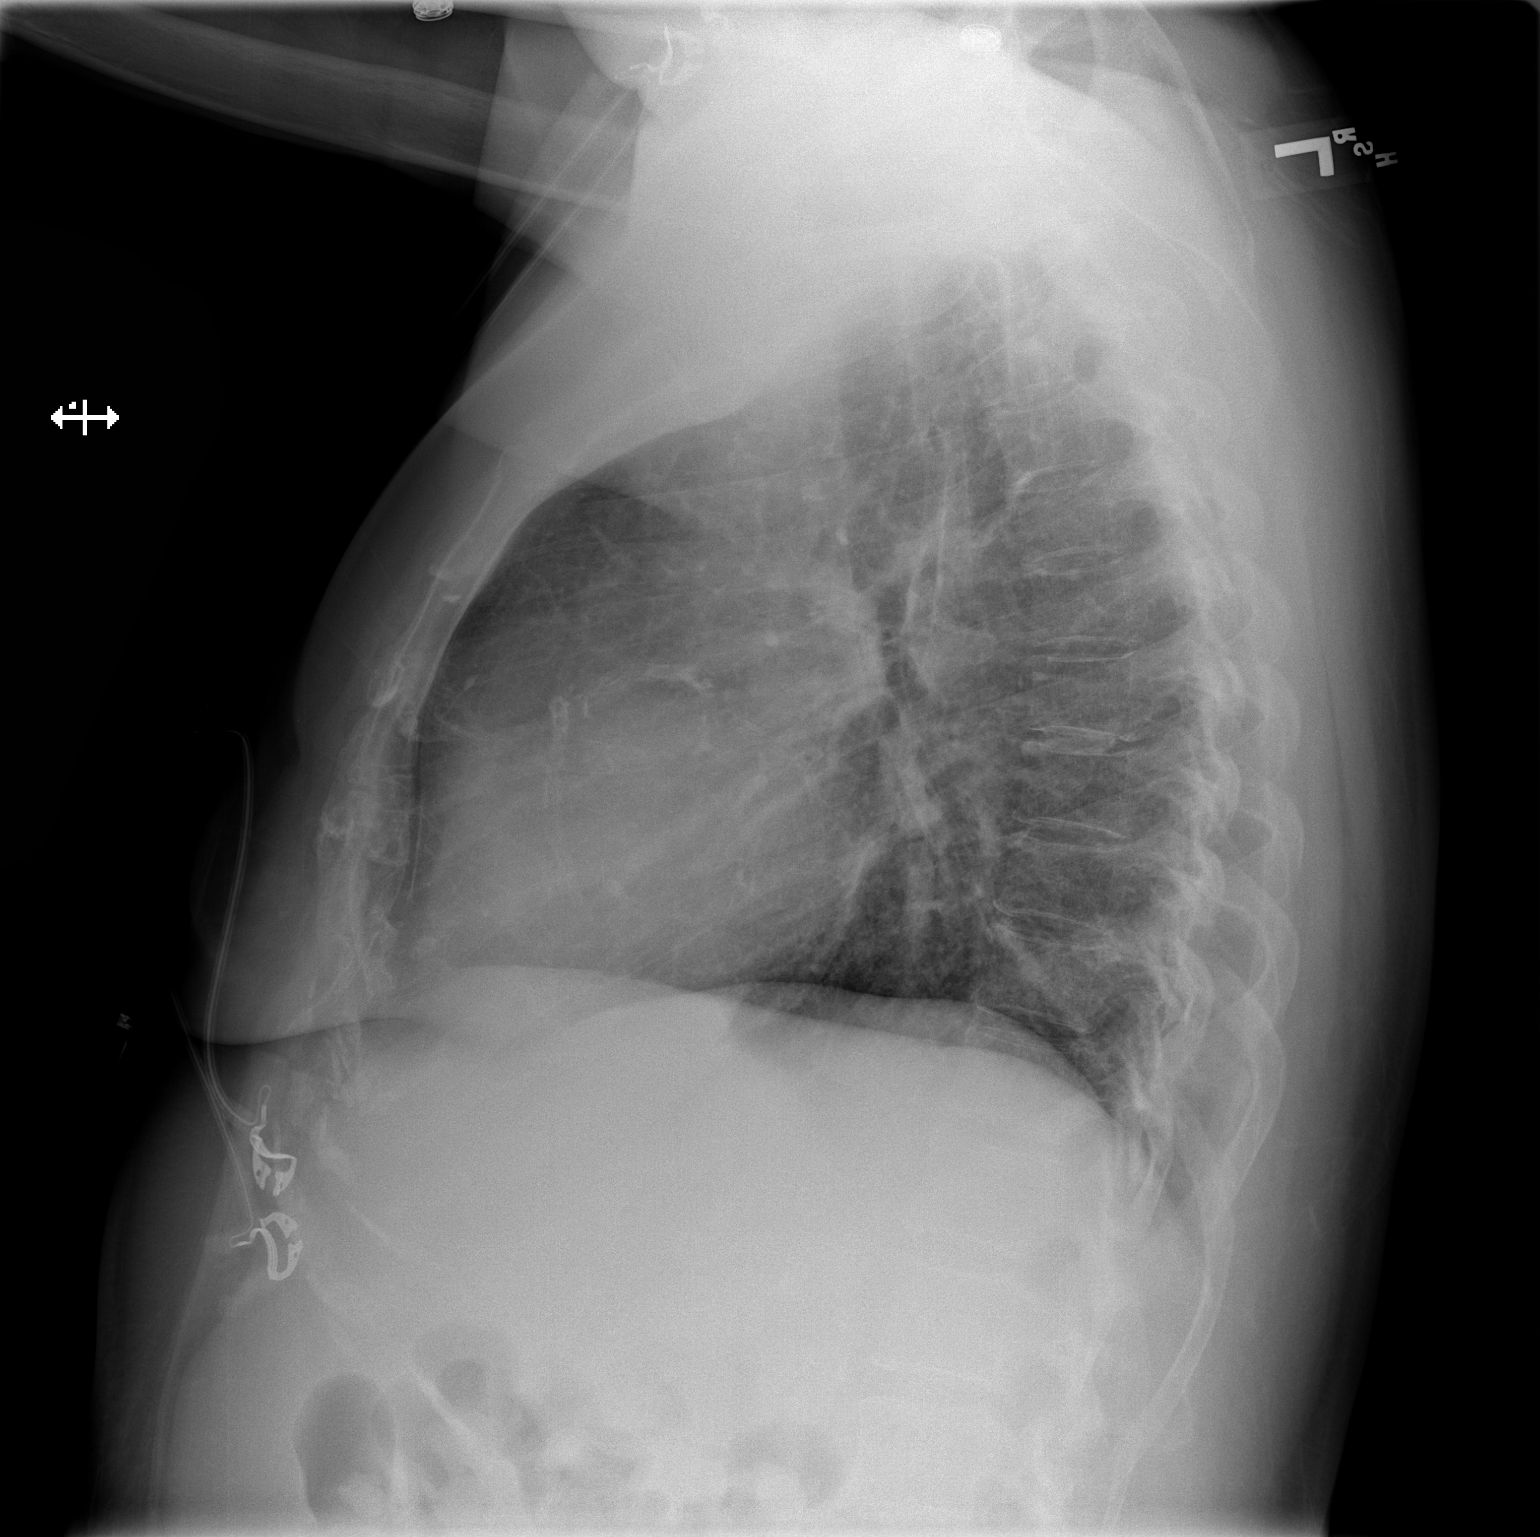

[2 of 2 positions shown; findings below may reference images not displayed]

FINDINGS: The lungs are well-aerated.  Mild interstitial prominence
is likely chronic in nature.  Peribronchial thickening is noted.
There is no evidence of focal opacification, pleural effusion or
pneumothorax.

The heart is normal in size; the mediastinal contour is within
normal limits.  No acute osseous abnormalities are seen.
IMPRESSION: Peribronchial thickening noted; chronic lung changes seen.  No
evidence of focal consolidation.

## 2013-07-04 IMAGING — CR DG CHEST 1V PORT
1 series · 1 of 1 positions shown · non-contrast
Comparison: 01/11/2011

CLINICAL DATA: Day one postop open heart surgery.

PORTABLE CHEST - 1 VIEW

[AP]
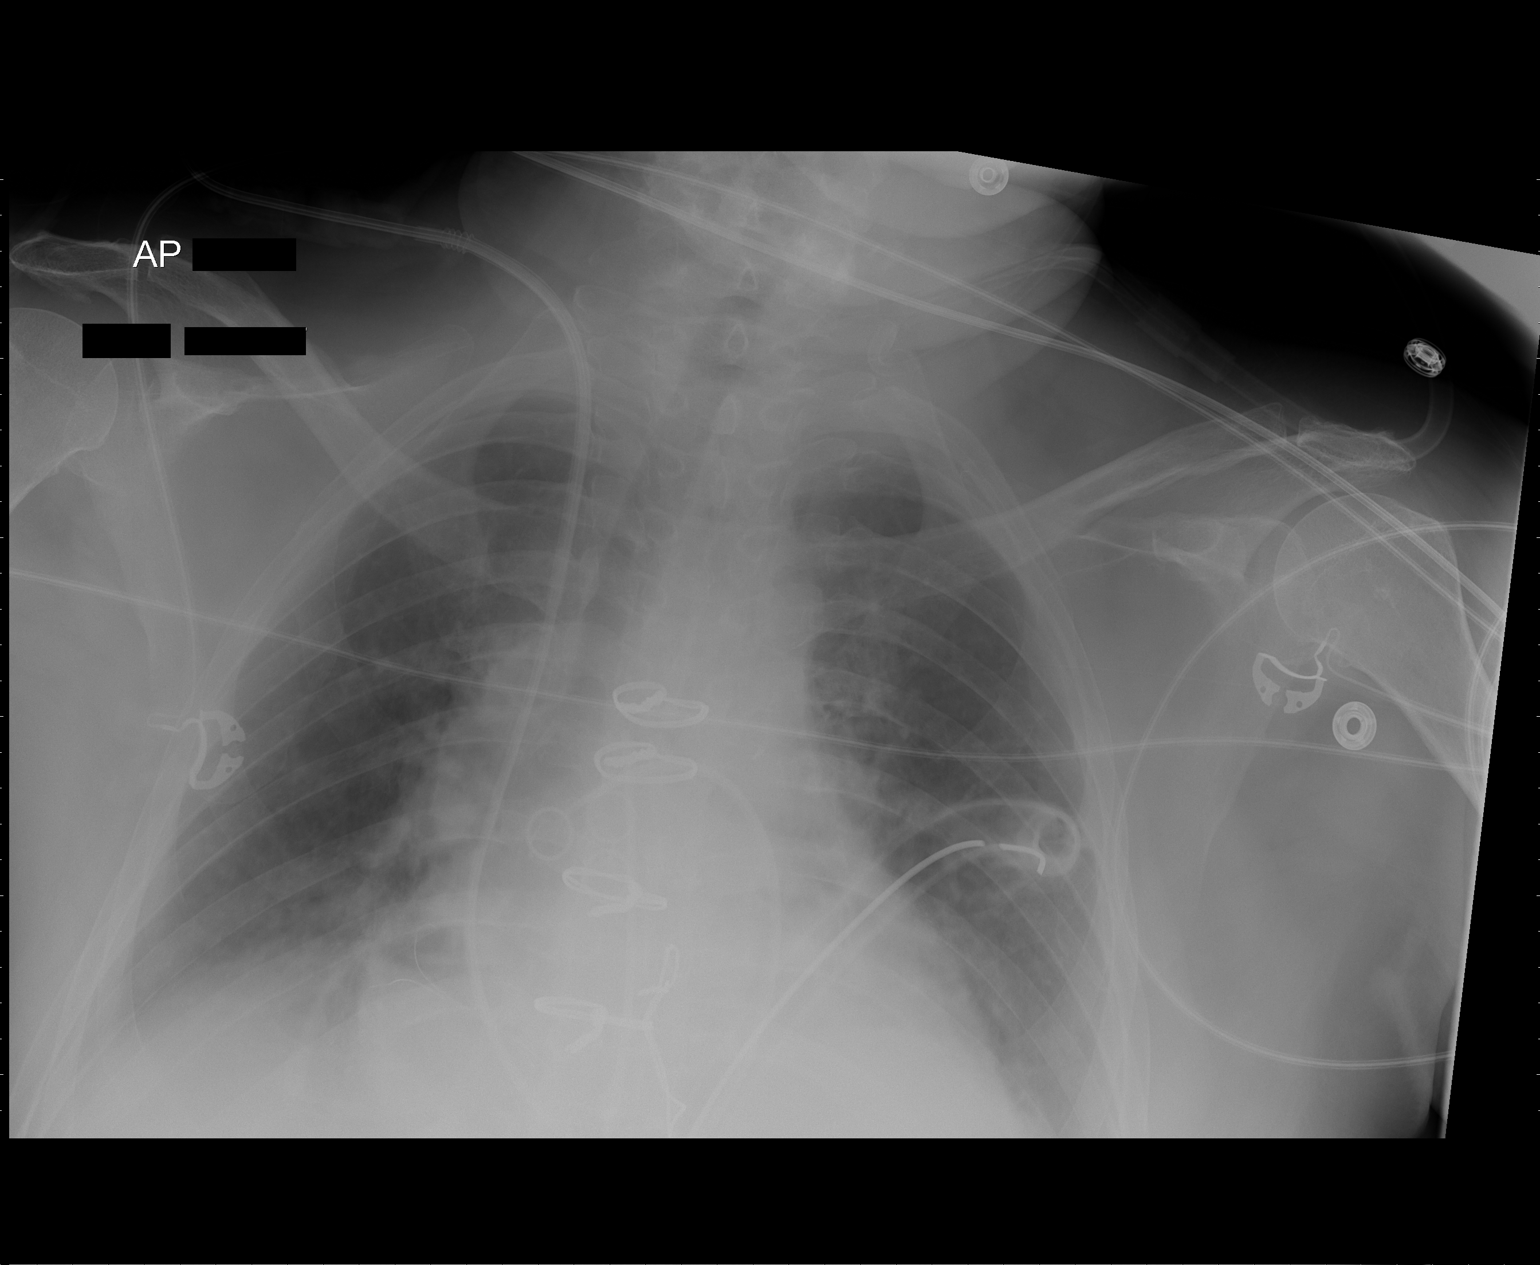

[1 of 1 positions shown; findings below may reference images not displayed]

FINDINGS: Endotracheal tube and NG tube have been removed.  Chest
tubes and Swan-Ganz catheter are unchanged.  New slight atelectasis
at the lung bases.  No pneumothorax.  Vascularity is normal.
IMPRESSION: New slight atelectasis at the lung bases.

## 2013-07-15 ENCOUNTER — Ambulatory Visit: Payer: Medicare FFS | Admitting: Cardiothoracic Surgery

## 2013-07-21 IMAGING — CR DG CHEST 1V PORT SAME DAY
1 series · 1 of 1 positions shown · non-contrast
Comparison: Portable exam 5766 hours compared to 4288 hours

CLINICAL DATA: Central line placement

PORTABLE CHEST - 1 VIEW SAME DAY

[view not recorded]
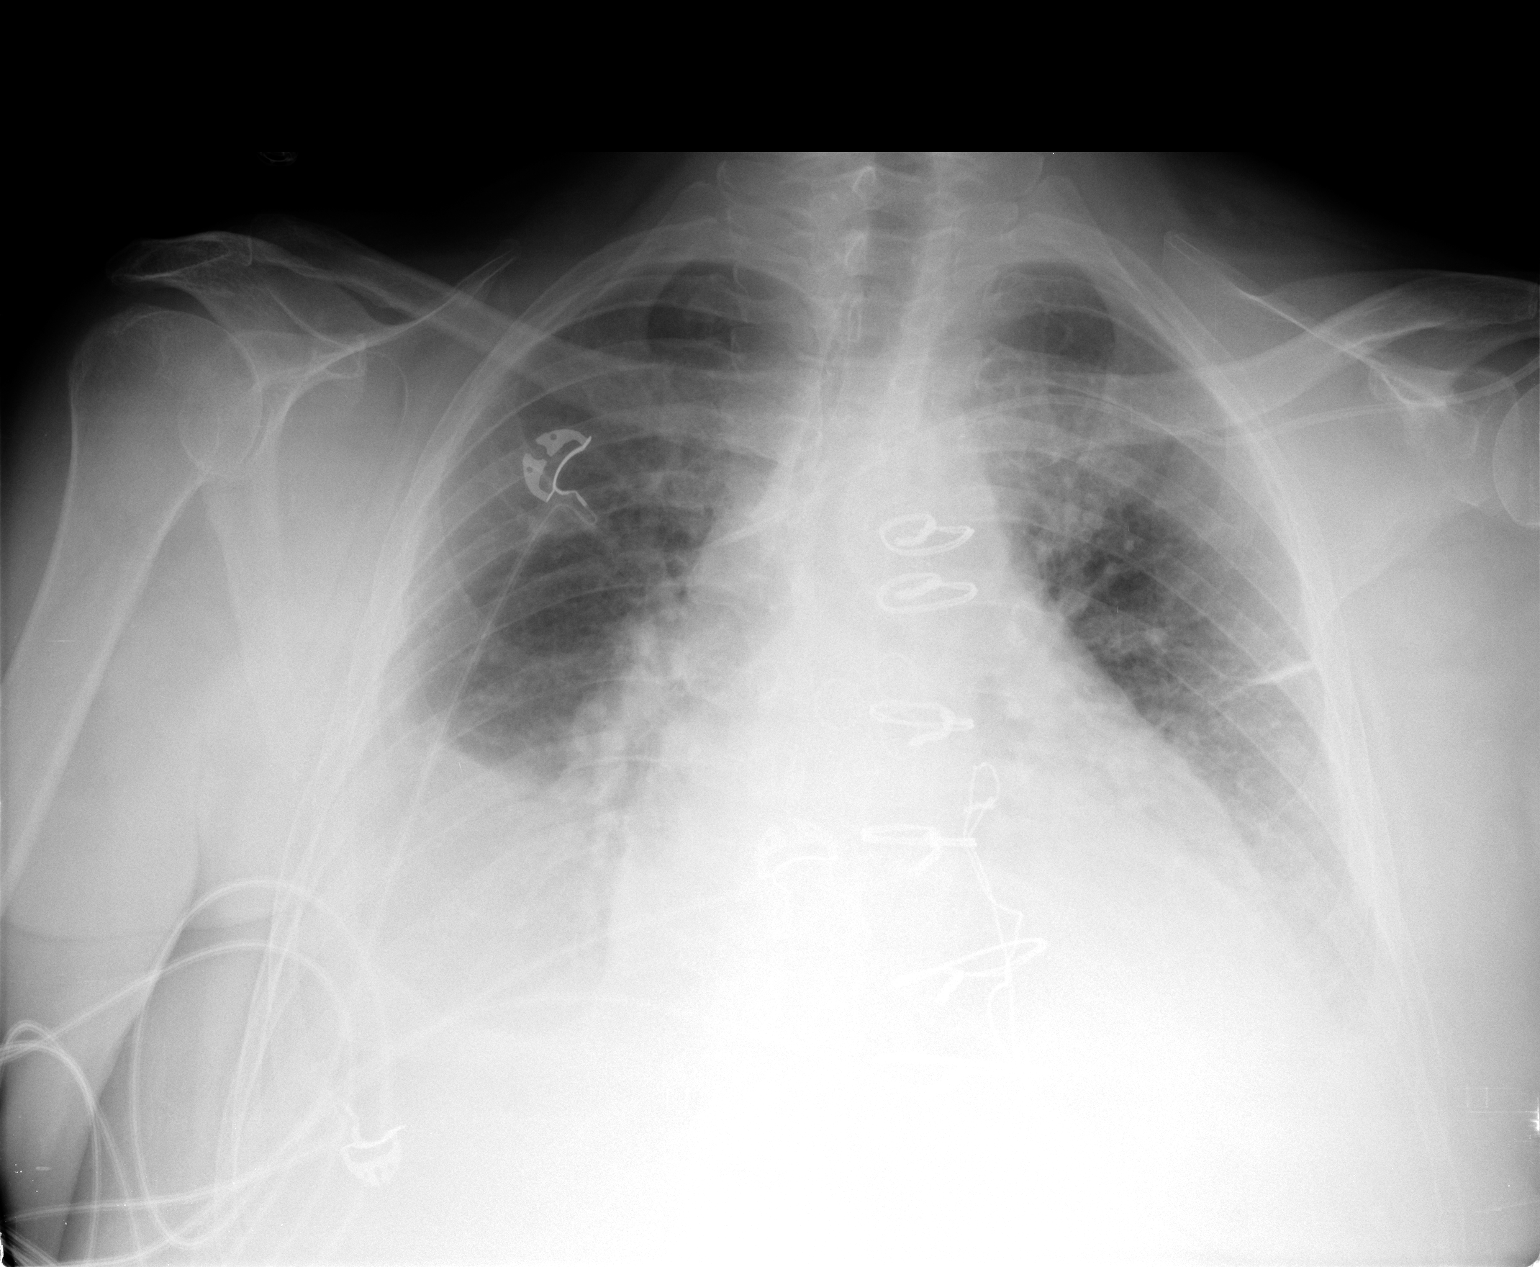

[1 of 1 positions shown; findings below may reference images not displayed]

FINDINGS: New left subclavian central venous catheter, tip projecting over
SVC at the level of the azygos confluence.
Enlargement of cardiac silhouette post CABG.
Pulmonary vascular congestion.
Bibasilar infiltrates and question pleural effusions little
changed.
Atelectasis left mid lung.
No pneumothorax following central line placement.
IMPRESSION: No pneumothorax following central line placement.
Persistent bibasilar consolidation and question pleural effusions.

## 2013-07-23 IMAGING — CR DG CHEST 1V
1 series · 1 of 1 positions shown · non-contrast
Comparison: 01/29/2011

CLINICAL DATA: CHF

CHEST - 1 VIEW

[view not recorded]
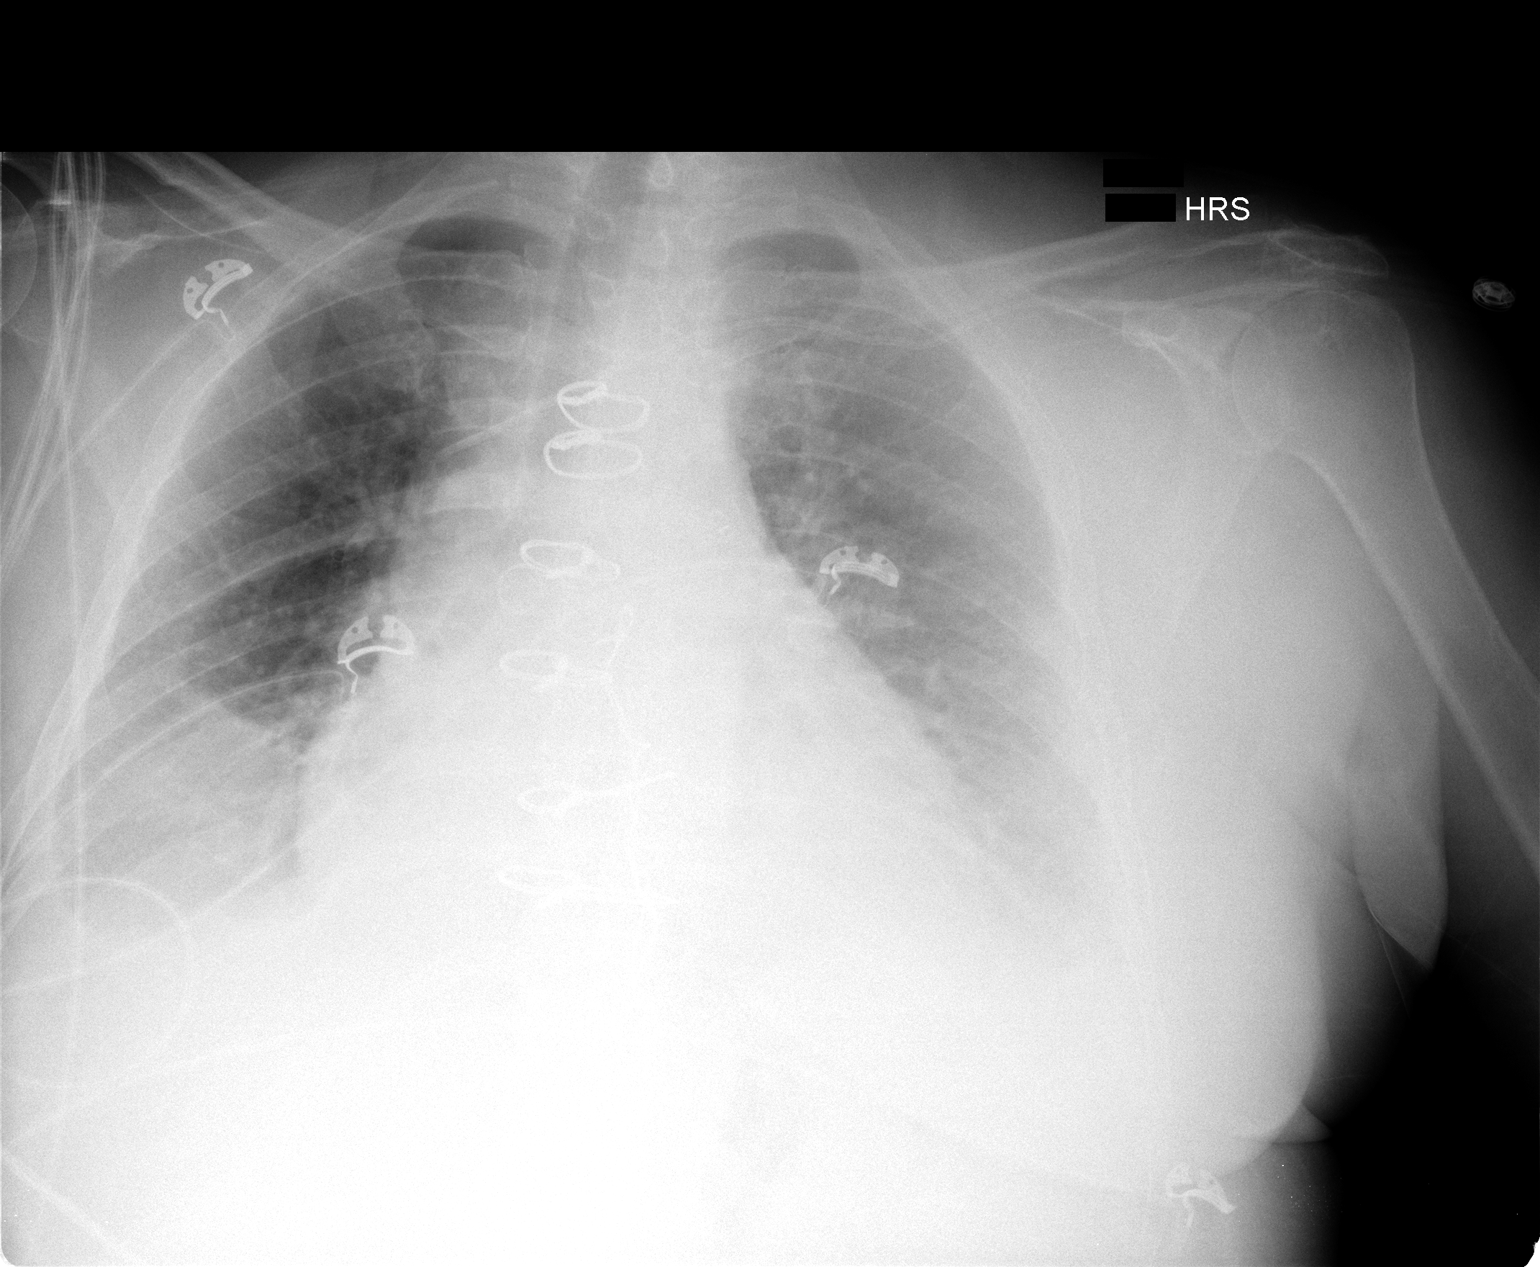

[1 of 1 positions shown; findings below may reference images not displayed]

FINDINGS: Stable left subclavian central venous catheter.  Moderate
cardiomegaly.  Bibasilar atelectasis and bilateral pleural
effusions stable.  No pneumothorax.
IMPRESSION: Stable bilateral effusions and bibasilar atelectasis.

## 2013-07-28 ENCOUNTER — Other Ambulatory Visit: Payer: Self-pay | Admitting: *Deleted

## 2013-07-28 DIAGNOSIS — M792 Neuralgia and neuritis, unspecified: Secondary | ICD-10-CM

## 2013-07-28 MED ORDER — GABAPENTIN 300 MG PO CAPS: 300.0000 mg | ORAL_CAPSULE | Freq: Two times a day (BID) | ORAL | Status: DC

## 2013-07-29 ENCOUNTER — Ambulatory Visit: Payer: Medicare FFS | Admitting: Cardiothoracic Surgery

## 2013-08-20 ENCOUNTER — Telehealth: Payer: Self-pay | Admitting: Endocrinology

## 2013-08-20 NOTE — Telephone Encounter (Signed)
Patient states that her insulin costs too much and would like to know if there is another alternative medication that she could take?

## 2013-08-20 NOTE — Telephone Encounter (Signed)
Pt called back. She states the insurance suggested Humulin N and Humulin 70/30. Which would you like and what dosage? Thanks!

## 2013-08-20 NOTE — Telephone Encounter (Signed)
Pt advised to her to contact her insurance to see what the preferred medication is. Pt to call back with information .

## 2013-08-21 NOTE — Telephone Encounter (Signed)
Pt takes reg and NPH insulins.  The dosages in med list are correct.  Either humulin or novolin are ok.

## 2013-08-25 MED ORDER — INSULIN NPH (HUMAN) (ISOPHANE) 100 UNIT/ML ~~LOC~~ SUSP: 100.0000 [IU] | Freq: Every day | SUBCUTANEOUS | Status: DC

## 2013-08-25 MED ORDER — INSULIN NPH ISOPHANE & REGULAR (70-30) 100 UNIT/ML ~~LOC~~ SUSP: SUBCUTANEOUS | Status: DC

## 2013-08-25 NOTE — Telephone Encounter (Signed)
Rx sent for Humulin 70/30 and Humulin N. Pt advised.

## 2013-08-26 ENCOUNTER — Telehealth: Payer: Self-pay | Admitting: Endocrinology

## 2013-08-26 ENCOUNTER — Ambulatory Visit: Payer: Medicare FFS | Admitting: Cardiothoracic Surgery

## 2013-08-26 NOTE — Telephone Encounter (Signed)
Pt states that the night dosage was sent in but not the day dosage  She is calling pharmacy because we did call in both

## 2013-09-01 ENCOUNTER — Other Ambulatory Visit (HOSPITAL_COMMUNITY): Payer: Self-pay | Admitting: Orthopaedic Surgery

## 2013-09-01 DIAGNOSIS — M25562 Pain in left knee: Secondary | ICD-10-CM

## 2013-09-03 ENCOUNTER — Ambulatory Visit (HOSPITAL_COMMUNITY)
Admission: RE | Admit: 2013-09-03 | Discharge: 2013-09-03 | Disposition: A | Discharge status: Home / Self Care | Payer: Medicare FFS | Source: Ambulatory Visit | Attending: Orthopaedic Surgery | Admitting: Orthopaedic Surgery

## 2013-09-03 DIAGNOSIS — W19XXXA Unspecified fall, initial encounter: Secondary | ICD-10-CM | POA: Insufficient documentation

## 2013-09-03 DIAGNOSIS — S99919A Unspecified injury of unspecified ankle, initial encounter: Principal | ICD-10-CM

## 2013-09-03 DIAGNOSIS — S8990XA Unspecified injury of unspecified lower leg, initial encounter: Secondary | ICD-10-CM | POA: Insufficient documentation

## 2013-09-03 DIAGNOSIS — R937 Abnormal findings on diagnostic imaging of other parts of musculoskeletal system: Secondary | ICD-10-CM | POA: Insufficient documentation

## 2013-09-03 DIAGNOSIS — M25569 Pain in unspecified knee: Secondary | ICD-10-CM | POA: Insufficient documentation

## 2013-09-03 DIAGNOSIS — M2240 Chondromalacia patellae, unspecified knee: Secondary | ICD-10-CM | POA: Insufficient documentation

## 2013-09-03 DIAGNOSIS — M25562 Pain in left knee: Secondary | ICD-10-CM

## 2013-09-03 DIAGNOSIS — S99929A Unspecified injury of unspecified foot, initial encounter: Principal | ICD-10-CM

## 2013-09-09 ENCOUNTER — Encounter: Payer: Medicare FFS | Admitting: Cardiothoracic Surgery

## 2013-10-05 ENCOUNTER — Other Ambulatory Visit: Payer: Self-pay | Admitting: Adult Health

## 2013-11-11 ENCOUNTER — Ambulatory Visit (INDEPENDENT_AMBULATORY_CARE_PROVIDER_SITE_OTHER): Payer: Medicare FFS | Admitting: Cardiology

## 2013-11-11 ENCOUNTER — Encounter: Payer: Self-pay | Admitting: Cardiology

## 2013-11-11 VITALS — BP 127/68 | HR 67 | Ht 65.0 in | Wt 185.0 lb

## 2013-11-11 DIAGNOSIS — I255 Ischemic cardiomyopathy: Secondary | ICD-10-CM

## 2013-11-11 DIAGNOSIS — F4321 Adjustment disorder with depressed mood: Secondary | ICD-10-CM | POA: Insufficient documentation

## 2013-11-11 DIAGNOSIS — F4320 Adjustment disorder, unspecified: Secondary | ICD-10-CM | POA: Insufficient documentation

## 2013-11-11 DIAGNOSIS — I10 Essential (primary) hypertension: Secondary | ICD-10-CM

## 2013-11-11 DIAGNOSIS — E785 Hyperlipidemia, unspecified: Secondary | ICD-10-CM

## 2013-11-11 DIAGNOSIS — I2589 Other forms of chronic ischemic heart disease: Secondary | ICD-10-CM

## 2013-11-11 MED ORDER — FUROSEMIDE 40 MG PO TABS: 40.0000 mg | ORAL_TABLET | ORAL | Status: AC

## 2013-11-11 MED ORDER — CARVEDILOL 6.25 MG PO TABS: 6.2500 mg | ORAL_TABLET | Freq: Two times a day (BID) | ORAL | Status: DC

## 2013-11-11 MED ORDER — LOSARTAN POTASSIUM 25 MG PO TABS: 25.0000 mg | ORAL_TABLET | Freq: Every day | ORAL | Status: DC

## 2013-11-11 MED ORDER — NITROGLYCERIN 0.4 MG SL SUBL: 0.4000 mg | SUBLINGUAL_TABLET | SUBLINGUAL | Status: DC | PRN

## 2013-11-11 MED ORDER — ISOSORBIDE MONONITRATE ER 30 MG PO TB24: 30.0000 mg | ORAL_TABLET | Freq: Every day | ORAL | Status: AC

## 2013-11-11 NOTE — Assessment & Plan Note (Signed)
Continues on Zocor.

## 2013-11-11 NOTE — Assessment & Plan Note (Signed)
Patient's husband passed suddenly and unexpectedly back in June. She is having difficulty sleeping and also anxiety. Does have family members in town that provides some support. I have recommended that she see Dr. Loanne Drilling, may need to be considered for behavioral health counseling, and potentially medical therapy.

## 2013-11-11 NOTE — Assessment & Plan Note (Signed)
LVEF 50-55% by echocardiogram earlier this year. She is having more shortness of breath, her weight is up 5 pounds. I recommended that she take her Lasix every day for a week, follow her subsequent weights, and then reduce back to every other day.

## 2013-11-11 NOTE — Assessment & Plan Note (Signed)
No active angina symptoms. ECG reviewed.

## 2013-11-11 NOTE — Progress Notes (Signed)
Clinical Summary Ms. Hakeem is a 63 y.o.female last seen in March. Interval visits with Dr. Prescott Gum noted. She was tearful today recounting the sudden and unexpected passing of her husband back in late June, still clearly going through the grieving process. She has not yet seen Dr. Loanne Drilling. She tells me that she feels somewhat more short of breath, no chest pain however. Describes compliance with her medications. Having trouble sleeping and also anxious.  Followup echocardiogram in March showed moderate LVH with LVEF 50-55%, hypokinesis of the distal anteroseptal wall, grade 1 diastolic dysfunction, moderate left atrial enlargement, mildly thickened mitral valve with mild mitral regurgitation, mildly sclerotic aortic valve, trivial tricuspid regurgitation with normal PASP 26 mm mercury.  Weight is up approximately 5 pounds. ECG today shows sinus rhythm with left anterior fascicular block, incomplete right bundle branch block, nonspecific ST-T changes, and increased voltage.   Allergies  Allergen Reactions  . Clopidogrel Bisulfate Hives  . Codeine Nausea And Vomiting  . Hydralazine Hives  . Morphine Nausea And Vomiting    Current Outpatient Prescriptions  Medication Sig Dispense Refill  . ACCU-CHEK FASTCLIX LANCETS MISC 1 each by Does not apply route 2 (two) times daily.  200 each  1  . aspirin 325 MG EC tablet Take 325 mg by mouth daily.      . Blood Glucose Monitoring Suppl (ACCU-CHEK NANO SMARTVIEW) W/DEVICE KIT 1 each by Does not apply route 2 (two) times daily.  1 kit  0  . carvedilol (COREG) 6.25 MG tablet Take 1 tablet (6.25 mg total) by mouth 2 (two) times daily with a meal.  180 tablet  3  . diclofenac (VOLTAREN) 75 MG EC tablet Take 75 mg by mouth 2 (two) times daily.      . furosemide (LASIX) 40 MG tablet Take 1 tablet (40 mg total) by mouth every other day.  45 tablet  3  . gabapentin (NEURONTIN) 300 MG capsule Take 1 capsule (300 mg total) by mouth 2 (two) times daily.  60  capsule  5  . glucose blood (ACCU-CHEK SMARTVIEW) test strip Use as instructed to check blood sugars 2 times per day dx code 250.02  100 each  12  . insulin NPH Human (HUMULIN N,NOVOLIN N) 100 UNIT/ML injection Inject 1 mL (100 Units total) into the skin at bedtime.  3 vial  2  . insulin NPH-regular Human (HUMULIN 70/30) (70-30) 100 UNIT/ML injection Inject 100 units 3 times per day.  9 vial  2  . isosorbide mononitrate (IMDUR) 30 MG 24 hr tablet Take 1 tablet (30 mg total) by mouth daily.  90 tablet  3  . losartan (COZAAR) 25 MG tablet Take 1 tablet (25 mg total) by mouth daily.  90 tablet  3  . Multiple Vitamin (MULITIVITAMIN WITH MINERALS) TABS Take 1 tablet by mouth daily.      . nitroGLYCERIN (NITROSTAT) 0.4 MG SL tablet Place 1 tablet (0.4 mg total) under the tongue every 5 (five) minutes as needed for chest pain.  25 tablet  3  . simvastatin (ZOCOR) 20 MG tablet TAKE 1 TABLET AT BEDTIME  90 tablet  3   No current facility-administered medications for this visit.    Past Medical History  Diagnosis Date  . Coronary atherosclerosis of native coronary artery     BMS circ 2001, DES LAD 2005, DES LAD and RCA 2008, subsequent CABG  . Drug allergy     Allergy to Plavix (hives) - took Ticlid in past  .  Mixed hyperlipidemia   . Type 2 diabetes mellitus   . Rheumatoid arthritis(714.0)   . Depression   . Peripheral neuropathy   . Peptic ulcer disease ~ 1975  . Nephrolithiasis 02/07/2011  . Breast cancer     Chemo and mastectomy - right  . Chronic systolic heart failure     LVEF 40-45%  . Anxiety   . Sternal wound infection     CABG  . Stroke 07/2011    Past Surgical History  Procedure Laterality Date  . Mastectomy  1988    right  . Cholecystectomy  1969  . Abdominal hysterectomy  1989  . Tonsillectomy and adenoidectomy  1979  . Appendectomy  1969  . Tubal ligation  1972  . Coronary artery bypass graft  01/11/2011    Procedure: CORONARY ARTERY BYPASS GRAFTING (CABG);  Surgeon:  Tharon Aquas Adelene Idler, MD;  Location: [facility];  Service: Open Heart Surgery;  Laterality: N/A;  cabg x four, using left internal mammary artery and right leg greater saphenous vein harvestede endoscopically  . Sternal wound debridement  03/01/2011    Procedure: STERNAL WOUND DEBRIDEMENT;  Surgeon: Tharon Aquas Adelene Idler, MD;  Location: [facility];  Service: Open Heart Surgery;  Laterality: N/A;  . Application of wound vac  03/01/2011    Procedure: APPLICATION OF WOUND VAC;  Surgeon: Tharon Aquas Adelene Idler, MD;  Location: [facility];  Service: Open Heart Surgery;  Laterality: N/A;  . Breast surgery    . Sternal wires removal  04/02/2011    Procedure: STERNAL WIRES REMOVAL;  Surgeon: Tharon Aquas Adelene Idler, MD;  Location: [facility];  Service: Open Heart Surgery;  Laterality: N/A;  . Incision and drainage of wound  12/06/2011    Procedure: IRRIGATION AND DEBRIDEMENT WOUND;  Surgeon: Theodoro Kos, DO;  Location: [facility];  Service: Plastics;  Laterality: N/A;  IRRIGATION AND DEBRIDEMENT OF STERNAL ULCER AND PLACEMENT OF ACELL     Social History Ms. Rotundo reports that she quit smoking about 6 years ago. Her smoking use included Cigarettes. She has a 20 pack-year smoking history. She has never used smokeless tobacco. Ms. Moultry reports that she does not drink alcohol.  Review of Systems No palpitations or syncope. No frank leg edema. No orthopnea. Appetite is fair. Other systems reviewed and negative except as outlined.  Physical Examination Filed Vitals:   11/11/13 1306  BP: 127/68  Pulse: 67   Filed Weights   11/11/13 1306  Weight: 185 lb (83.915 kg)    No distress. HEENT: Conjunctiva and lids normal, oropharynx clear.  Neck: Supple, no elevated JVP or carotid bruits, no thyromegaly.  Lungs: Clear to auscultation, diminished at bases, nonlabored breathing at rest.  Cardiac: Regular rate and rhythm, no S3 or pericardial rub.  Extremities: Trace ankle edema, distal pulses1+.  Skin: Warm and dry.  Musculoskeletal: No  kyphosis.  Neuropsychiatric: Alert and oriented x3, affect appropriate.   Problem List and Plan   Grief reaction Patient's husband passed suddenly and unexpectedly back in June. She is having difficulty sleeping and also anxiety. Does have family members in town that provides some support. I have recommended that she see Dr. Loanne Drilling, may need to be considered for behavioral health counseling, and potentially medical therapy.  Coronary atherosclerosis of native coronary artery No active angina symptoms. ECG reviewed.  Ischemic cardiomyopathy LVEF 50-55% by echocardiogram earlier this year. She is having more shortness of breath, her weight is up 5 pounds. I recommended that she take her Lasix  every day for a week, follow her subsequent weights, and then reduce back to every other day.  HYPERLIPIDEMIA-MIXED Continues on Zocor.  Essential hypertension, benign Blood pressure is reasonably controlled today.    Satira Sark, M.D., F.A.C.C.

## 2013-11-11 NOTE — Patient Instructions (Signed)
Your physician recommends that you schedule a follow-up appointment in: 3 months. Your physician has recommended you make the following change in your medication:  Please take your furosemide daily for 1 week, then resume every other day dosing. Continue all other medications the same.

## 2013-11-11 NOTE — Assessment & Plan Note (Signed)
Blood pressure is reasonably controlled today.

## 2013-11-17 ENCOUNTER — Other Ambulatory Visit: Payer: Self-pay | Admitting: *Deleted

## 2013-11-17 MED ORDER — NITROGLYCERIN 0.4 MG SL SUBL: 0.4000 mg | SUBLINGUAL_TABLET | SUBLINGUAL | Status: DC | PRN

## 2013-11-17 NOTE — Telephone Encounter (Signed)
CLARIFICATION FOR NITROGLYCERIN DIRECTIONS NEEDED.

## 2013-11-18 ENCOUNTER — Other Ambulatory Visit: Payer: Self-pay | Admitting: *Deleted

## 2013-11-18 MED ORDER — NITROGLYCERIN 0.4 MG SL SUBL: 0.4000 mg | SUBLINGUAL_TABLET | SUBLINGUAL | Status: AC | PRN

## 2013-11-23 ENCOUNTER — Encounter: Payer: Self-pay | Admitting: Endocrinology

## 2013-11-23 ENCOUNTER — Ambulatory Visit (INDEPENDENT_AMBULATORY_CARE_PROVIDER_SITE_OTHER): Payer: Medicare FFS | Admitting: Endocrinology

## 2013-11-23 VITALS — BP 132/62 | HR 73 | Temp 98.4°F | Ht 65.0 in | Wt 181.0 lb

## 2013-11-23 DIAGNOSIS — E1142 Type 2 diabetes mellitus with diabetic polyneuropathy: Secondary | ICD-10-CM

## 2013-11-23 DIAGNOSIS — E1149 Type 2 diabetes mellitus with other diabetic neurological complication: Secondary | ICD-10-CM

## 2013-11-23 MED ORDER — INSULIN REGULAR HUMAN 100 UNIT/ML IJ SOLN: 120.0000 [IU] | Freq: Three times a day (TID) | INTRAMUSCULAR | Status: DC

## 2013-11-23 MED ORDER — TRAZODONE HCL 300 MG PO TABS: 300.0000 mg | ORAL_TABLET | Freq: Every day | ORAL | Status: AC

## 2013-11-23 NOTE — Progress Notes (Signed)
Subjective:    Patient ID: [name], female    DOB: 07-23-1950, 63 y.o.   MRN: [id number]  HPI Pt returns for f/u of diabetes mellitus:   DM type: insulin-requiring type 2 Dx'ed: 8657 Complications: sensory neuropathy, CVA, CAD, and PAD Therapy: insulin since 1999 GDM: never DKA: never Severe hypoglycemia: never Pancreatitis: never Other info: she takes multiple daily injections: she takes human insulin due to cost.  Interval history:  Her husband was killed a few mos ago in a car accident.  She says she never misses the insulin injections.  no cbg record, but states cbg's vary from 200-400.  There is no trend throughout the day.  Her reg insulin was somehow changed to 70/30, a few months ago.   Past Medical History  Diagnosis Date  . Coronary atherosclerosis of native coronary artery     BMS circ 2001, DES LAD 2005, DES LAD and RCA 2008, subsequent CABG  . Drug allergy     Allergy to Plavix (hives) - took Ticlid in past  . Mixed hyperlipidemia   . Type 2 diabetes mellitus   . Rheumatoid arthritis(714.0)   . Depression   . Peripheral neuropathy   . Peptic ulcer disease ~ 1975  . Nephrolithiasis 02/07/2011  . Breast cancer     Chemo and mastectomy - right  . Chronic systolic heart failure     LVEF 40-45%  . Anxiety   . Sternal wound infection     CABG  . Stroke 07/2011    Past Surgical History  Procedure Laterality Date  . Mastectomy  1988    right  . Cholecystectomy  1969  . Abdominal hysterectomy  1989  . Tonsillectomy and adenoidectomy  1979  . Appendectomy  1969  . Tubal ligation  1972  . Coronary artery bypass graft  01/11/2011    Procedure: CORONARY ARTERY BYPASS GRAFTING (CABG);  Surgeon: Tharon Aquas Adelene Idler, MD;  Location: [facility];  Service: Open Heart Surgery;  Laterality: N/A;  cabg x four, using left internal mammary artery and right leg greater saphenous vein harvestede endoscopically  . Sternal wound debridement  03/01/2011    Procedure: STERNAL  WOUND DEBRIDEMENT;  Surgeon: Tharon Aquas Adelene Idler, MD;  Location: [facility];  Service: Open Heart Surgery;  Laterality: N/A;  . Application of wound vac  03/01/2011    Procedure: APPLICATION OF WOUND VAC;  Surgeon: Tharon Aquas Adelene Idler, MD;  Location: [facility];  Service: Open Heart Surgery;  Laterality: N/A;  . Breast surgery    . Sternal wires removal  04/02/2011    Procedure: STERNAL WIRES REMOVAL;  Surgeon: Tharon Aquas Adelene Idler, MD;  Location: [facility];  Service: Open Heart Surgery;  Laterality: N/A;  . Incision and drainage of wound  12/06/2011    Procedure: IRRIGATION AND DEBRIDEMENT WOUND;  Surgeon: Theodoro Kos, DO;  Location: [facility];  Service: Plastics;  Laterality: N/A;  IRRIGATION AND DEBRIDEMENT OF STERNAL ULCER AND PLACEMENT OF ACELL     History   Social History  . Marital Status: Widowed    Spouse Name: N/A    Number of Children: N/A  . Years of Education: N/A   Occupational History  . Not on file.   Social History Main Topics  . Smoking status: Former Smoker -- 1.00 packs/day for 20 years    Types: Cigarettes    Quit date: 02/27/2007  . Smokeless tobacco: Never Used  . Alcohol Use: No  . Drug Use: No  .  Sexual Activity: Yes    Birth Control/ Protection: Post-menopausal   Other Topics Concern  . Not on file   Social History Narrative  . No narrative on file    Current Outpatient Prescriptions on File Prior to Visit  Medication Sig Dispense Refill  . ACCU-CHEK FASTCLIX LANCETS MISC 1 each by Does not apply route 2 (two) times daily.  200 each  1  . aspirin 325 MG EC tablet Take 325 mg by mouth daily.      . Blood Glucose Monitoring Suppl (ACCU-CHEK NANO SMARTVIEW) W/DEVICE KIT 1 each by Does not apply route 2 (two) times daily.  1 kit  0  . carvedilol (COREG) 6.25 MG tablet Take 1 tablet (6.25 mg total) by mouth 2 (two) times daily with a meal.  180 tablet  3  . diclofenac (VOLTAREN) 75 MG EC tablet Take 75 mg by mouth 2 (two) times daily.      . furosemide (LASIX) 40 MG  tablet Take 1 tablet (40 mg total) by mouth every other day.  45 tablet  3  . gabapentin (NEURONTIN) 300 MG capsule Take 1 capsule (300 mg total) by mouth 2 (two) times daily.  60 capsule  5  . glucose blood (ACCU-CHEK SMARTVIEW) test strip Use as instructed to check blood sugars 2 times per day dx code 250.02  100 each  12  . insulin NPH Human (HUMULIN N,NOVOLIN N) 100 UNIT/ML injection Inject 1 mL (100 Units total) into the skin at bedtime.  3 vial  2  . isosorbide mononitrate (IMDUR) 30 MG 24 hr tablet Take 1 tablet (30 mg total) by mouth daily.  90 tablet  3  . losartan (COZAAR) 25 MG tablet Take 1 tablet (25 mg total) by mouth daily.  90 tablet  3  . Multiple Vitamin (MULITIVITAMIN WITH MINERALS) TABS Take 1 tablet by mouth daily.      . nitroGLYCERIN (NITROSTAT) 0.4 MG SL tablet Place 1 tablet (0.4 mg total) under the tongue every 5 (five) minutes x 3 doses as needed for chest pain. FOR SEVERE CHEST PAIN. MAX DOSE IS 3 TABLETS IN 15 MINUTES. IF NO RELIEF AFTER 3 RD DOSE, PROCEED TO THE ED FOR AN EVALUATION  25 tablet  3  . simvastatin (ZOCOR) 20 MG tablet TAKE 1 TABLET AT BEDTIME  90 tablet  3   No current facility-administered medications on file prior to visit.    Allergies  Allergen Reactions  . Clopidogrel Bisulfate Hives  . Codeine Nausea And Vomiting  . Hydralazine Hives  . Morphine Nausea And Vomiting    Family History  Problem Relation Age of Onset  . Anesthesia problems Neg Hx     BP 132/62  Pulse 73  Temp(Src) 98.4 F (36.9 C) (Oral)  Ht 5\' 5"  (1.651 m)  Wt 181 lb (82.101 kg)  BMI 30.12 kg/m2  SpO2 96%  LMP 01/05/1996  Review of Systems Denies weight change and hypoglycemia.      Objective:   Physical Exam VITAL SIGNS:  See vs page.   GENERAL: no distress. Pulses: dorsalis pedis intact bilat.   Feet: no deformity, except for hammer toes.  no edema.  There is bilateral onychomycosis Skin:  no ulcer on the feet.  normal color and temp. Neuro: sensation is  intact to touch on the feet.     Lab Results  Component Value Date   HGBA1C 10.4* 11/23/2013      Assessment & Plan:  DM: severe exacerbation. Bereavement, new.  This complicates the rx of DM. Insomnia, new to me.    Patient is advised the following: Patient Instructions  check your blood sugar twice a day.  vary the time of day when you check, between before the 3 meals, and at bedtime.  also check if you have symptoms of your blood sugar being too high or too low.  please keep a record of the readings and bring it to your next appointment here.  please call us sooner if your blood sugar goes below 70, or if you have a lot of readings over 200.   Please come back for a follow-up appointment in 6 weeks.   Please continue the NPH to 100 units at bedtime only.   Please also increase the Regular to 120 units 3 times a day (just before each meal).   you should take the regular insulin just only when you are about to eat.  If a meal is missed or significantly delayed, your blood sugar could go low.   Please call next week, to tell us how your blood sugar is doing.   It is important to get set up with a new regular doctor in or near [place].   Pending that, i have sent a prescription to your pharmacy, for the sleep.

## 2013-11-23 NOTE — Patient Instructions (Addendum)
check your blood sugar twice a day.  vary the time of day when you check, between before the 3 meals, and at bedtime.  also check if you have symptoms of your blood sugar being too high or too low.  please keep a record of the readings and bring it to your next appointment here.  please call us sooner if your blood sugar goes below 70, or if you have a lot of readings over 200.   Please come back for a follow-up appointment in 6 weeks.   Please continue the NPH to 100 units at bedtime only.   Please also increase the Regular to 120 units 3 times a day (just before each meal).   you should take the regular insulin just only when you are about to eat.  If a meal is missed or significantly delayed, your blood sugar could go low.   Please call next week, to tell us how your blood sugar is doing.   It is important to get set up with a new regular doctor in or near [place].   Pending that, i have sent a prescription to your pharmacy, for the sleep.

## 2013-11-24 LAB — HEMOGLOBIN A1C
Hgb A1c MFr Bld: 10.4 % — ABNORMAL HIGH (ref <5.7)
MEAN PLASMA GLUCOSE: 252 mg/dL — AB (ref <117)

## 2014-01-11 ENCOUNTER — Ambulatory Visit: Payer: Medicare FFS | Admitting: Endocrinology

## 2014-01-18 ENCOUNTER — Ambulatory Visit (INDEPENDENT_AMBULATORY_CARE_PROVIDER_SITE_OTHER): Payer: Medicare FFS | Admitting: Endocrinology

## 2014-01-18 ENCOUNTER — Encounter: Payer: Self-pay | Admitting: Endocrinology

## 2014-01-18 VITALS — BP 120/52 | HR 71 | Temp 98.4°F | Ht 65.0 in | Wt 185.0 lb

## 2014-01-18 DIAGNOSIS — E119 Type 2 diabetes mellitus without complications: Secondary | ICD-10-CM | POA: Insufficient documentation

## 2014-01-18 DIAGNOSIS — Z23 Encounter for immunization: Secondary | ICD-10-CM

## 2014-01-18 DIAGNOSIS — E1051 Type 1 diabetes mellitus with diabetic peripheral angiopathy without gangrene: Secondary | ICD-10-CM

## 2014-01-18 LAB — LIPID PANEL
CHOLESTEROL: 137 mg/dL (ref 0–200)
HDL: 33 mg/dL — ABNORMAL LOW (ref >39.00)
NonHDL: 104
Total CHOL/HDL Ratio: 4
Triglycerides: 211 mg/dL — ABNORMAL HIGH (ref 0.0–149.0)
VLDL: 42.2 mg/dL — ABNORMAL HIGH (ref 0.0–40.0)

## 2014-01-18 LAB — HEMOGLOBIN A1C: Hgb A1c MFr Bld: 9.7 % — ABNORMAL HIGH (ref 4.6–6.5)

## 2014-01-18 LAB — BASIC METABOLIC PANEL
BUN: 26 mg/dL — ABNORMAL HIGH (ref 6–23)
CALCIUM: 9.1 mg/dL (ref 8.4–10.5)
CO2: 27 mEq/L (ref 19–32)
CREATININE: 1.2 mg/dL (ref 0.4–1.2)
Chloride: 104 mEq/L (ref 96–112)
GFR: 50.44 mL/min — AB (ref >60.00)
Glucose, Bld: 266 mg/dL — ABNORMAL HIGH (ref 70–99)
Potassium: 4.8 mEq/L (ref 3.5–5.1)
Sodium: 140 mEq/L (ref 135–145)

## 2014-01-18 LAB — LDL CHOLESTEROL, DIRECT: Direct LDL: 80.7 mg/dL

## 2014-01-18 NOTE — Patient Instructions (Addendum)
check your blood sugar twice a day.  vary the time of day when you check, between before the 3 meals, and at bedtime.  also check if you have symptoms of your blood sugar being too high or too low.  please keep a record of the readings and bring it to your next appointment here.  please call us sooner if your blood sugar goes below 70, or if you have a lot of readings over 200.   blood tests are being requested for you today.  We'll let you know about the results. Please come back for a follow-up appointment in 2 months.    you should take the regular insulin just only when you are about to eat.  If a meal is missed or significantly delayed, your blood sugar could go low.  It is really important to get set up with a new regular doctor in or near [place].

## 2014-01-18 NOTE — Progress Notes (Signed)
Subjective:    Patient ID: [name], female    DOB: 1950/10/10, 63 y.o.   MRN: [id number]  HPI Pt returns for f/u of diabetes mellitus:  DM type: insulin-requiring type 2 Dx'ed: 6599 Complications: sensory neuropathy, CVA, CAD, and PAD Therapy: insulin since 1999.  GDM: never. DKA: never Severe hypoglycemia: never Pancreatitis: never Other: she takes multiple daily injections: she takes human insulin due to cost: she has severe insulin resistance. Interval history: She says she seldom misses the insulin injections.  no cbg record, but states cbg's vary from 150-170.  It is in general higher as the day goes on.  There is no trend throughout the day.   Past Medical History  Diagnosis Date  . Coronary atherosclerosis of native coronary artery     BMS circ 2001, DES LAD 2005, DES LAD and RCA 2008, subsequent CABG  . Drug allergy     Allergy to Plavix (hives) - took Ticlid in past  . Mixed hyperlipidemia   . Type 2 diabetes mellitus   . Rheumatoid arthritis(714.0)   . Depression   . Peripheral neuropathy   . Peptic ulcer disease ~ 1975  . Nephrolithiasis 02/07/2011  . Breast cancer     Chemo and mastectomy - right  . Chronic systolic heart failure     LVEF 40-45%  . Anxiety   . Sternal wound infection     CABG  . Stroke 07/2011    Past Surgical History  Procedure Laterality Date  . Mastectomy  1988    right  . Cholecystectomy  1969  . Abdominal hysterectomy  1989  . Tonsillectomy and adenoidectomy  1979  . Appendectomy  1969  . Tubal ligation  1972  . Coronary artery bypass graft  01/11/2011    Procedure: CORONARY ARTERY BYPASS GRAFTING (CABG);  Surgeon: Tharon Aquas Adelene Idler, MD;  Location: [facility];  Service: Open Heart Surgery;  Laterality: N/A;  cabg x four, using left internal mammary artery and right leg greater saphenous vein harvestede endoscopically  . Sternal wound debridement  03/01/2011    Procedure: STERNAL WOUND DEBRIDEMENT;  Surgeon: Tharon Aquas Adelene Idler,  MD;  Location: [facility];  Service: Open Heart Surgery;  Laterality: N/A;  . Application of wound vac  03/01/2011    Procedure: APPLICATION OF WOUND VAC;  Surgeon: Tharon Aquas Adelene Idler, MD;  Location: [facility];  Service: Open Heart Surgery;  Laterality: N/A;  . Breast surgery    . Sternal wires removal  04/02/2011    Procedure: STERNAL WIRES REMOVAL;  Surgeon: Tharon Aquas Adelene Idler, MD;  Location: [facility];  Service: Open Heart Surgery;  Laterality: N/A;  . Incision and drainage of wound  12/06/2011    Procedure: IRRIGATION AND DEBRIDEMENT WOUND;  Surgeon: Theodoro Kos, DO;  Location: [facility];  Service: Plastics;  Laterality: N/A;  IRRIGATION AND DEBRIDEMENT OF STERNAL ULCER AND PLACEMENT OF ACELL     History   Social History  . Marital Status: Widowed    Spouse Name: N/A    Number of Children: N/A  . Years of Education: N/A   Occupational History  . Not on file.   Social History Main Topics  . Smoking status: Former Smoker -- 1.00 packs/day for 20 years    Types: Cigarettes    Quit date: 02/27/2007  . Smokeless tobacco: Never Used  . Alcohol Use: No  . Drug Use: No  . Sexual Activity: Yes    Birth Control/ Protection: Post-menopausal  Other Topics Concern  . Not on file   Social History Narrative    Current Outpatient Prescriptions on File Prior to Visit  Medication Sig Dispense Refill  . ACCU-CHEK FASTCLIX LANCETS MISC 1 each by Does not apply route 2 (two) times daily. 200 each 1  . aspirin 325 MG EC tablet Take 325 mg by mouth daily.    . Blood Glucose Monitoring Suppl (ACCU-CHEK NANO SMARTVIEW) W/DEVICE KIT 1 each by Does not apply route 2 (two) times daily. 1 kit 0  . carvedilol (COREG) 6.25 MG tablet Take 1 tablet (6.25 mg total) by mouth 2 (two) times daily with a meal. 180 tablet 3  . diclofenac (VOLTAREN) 75 MG EC tablet Take 75 mg by mouth 2 (two) times daily.    . furosemide (LASIX) 40 MG tablet Take 1 tablet (40 mg total) by mouth every other day. 45 tablet 3  . gabapentin  (NEURONTIN) 300 MG capsule Take 1 capsule (300 mg total) by mouth 2 (two) times daily. 60 capsule 5  . glucose blood (ACCU-CHEK SMARTVIEW) test strip Use as instructed to check blood sugars 2 times per day dx code 250.02 100 each 12  . insulin NPH Human (HUMULIN N,NOVOLIN N) 100 UNIT/ML injection Inject 1 mL (100 Units total) into the skin at bedtime. 3 vial 2  . insulin regular (NOVOLIN R,HUMULIN R) 100 units/mL injection Inject 1.2 mLs (120 Units total) into the skin 3 (three) times daily before meals. 120 mL 11  . isosorbide mononitrate (IMDUR) 30 MG 24 hr tablet Take 1 tablet (30 mg total) by mouth daily. 90 tablet 3  . losartan (COZAAR) 25 MG tablet Take 1 tablet (25 mg total) by mouth daily. 90 tablet 3  . Multiple Vitamin (MULITIVITAMIN WITH MINERALS) TABS Take 1 tablet by mouth daily.    . nitroGLYCERIN (NITROSTAT) 0.4 MG SL tablet Place 1 tablet (0.4 mg total) under the tongue every 5 (five) minutes x 3 doses as needed for chest pain. FOR SEVERE CHEST PAIN. MAX DOSE IS 3 TABLETS IN 15 MINUTES. IF NO RELIEF AFTER 3 RD DOSE, PROCEED TO THE ED FOR AN EVALUATION 25 tablet 3  . simvastatin (ZOCOR) 20 MG tablet TAKE 1 TABLET AT BEDTIME 90 tablet 3  . trazodone (DESYREL) 300 MG tablet Take 1 tablet (300 mg total) by mouth at bedtime. 30 tablet 1   No current facility-administered medications on file prior to visit.    Allergies  Allergen Reactions  . Clopidogrel Bisulfate Hives  . Codeine Nausea And Vomiting  . Hydralazine Hives  . Morphine Nausea And Vomiting    Family History  Problem Relation Age of Onset  . Anesthesia problems Neg Hx     BP 120/52 mmHg  Pulse 71  Temp(Src) 98.4 F (36.9 C) (Oral)  Ht $R'5\' 5"'YC$  (1.651 m)  Wt 185 lb (83.915 kg)  BMI 30.79 kg/m2  SpO2 95%  LMP 01/05/1996   Review of Systems She denies hypoglycemia and weight change.      Objective:   Physical Exam VITAL SIGNS:  See vs page GENERAL: no distress Pulses: dorsalis pedis intact bilat.   Feet:  no deformity.  no edema Skin:  no ulcer on the feet.  normal color and temp. Neuro: sensation is intact to touch on the feet, but decreased from normal.     Lab Results  Component Value Date   HGBA1C 9.7* 01/18/2014   Lab Results  Component Value Date   CREATININE 1.2 01/18/2014   BUN  26* 01/18/2014   NA 140 01/18/2014   K 4.8 01/18/2014   CL 104 01/18/2014   CO2 27 01/18/2014   Lab Results  Component Value Date   CHOL 137 01/18/2014   HDL 33.00* 01/18/2014   LDLCALC 69 07/29/2011   LDLDIRECT 80.7 01/18/2014   TRIG 211.0* 01/18/2014   CHOLHDL 4 01/18/2014       Assessment & Plan:  DM: moderate exacerbation Dyslipidemia: well-controlled Noncompliance with cbg recording: I'll work around this as best I can   Patient is advised the following: Patient Instructions  check your blood sugar twice a day.  vary the time of day when you check, between before the 3 meals, and at bedtime.  also check if you have symptoms of your blood sugar being too high or too low.  please keep a record of the readings and bring it to your next appointment here.  please call us sooner if your blood sugar goes below 70, or if you have a lot of readings over 200.   blood tests are being requested for you today.  We'll let you know about the results. Please come back for a follow-up appointment in 2 months.    you should take the regular insulin just only when you are about to eat.  If a meal is missed or significantly delayed, your blood sugar could go low.  It is really important to get set up with a new regular doctor in or near [place].     addendum: increase reg insulin to 140 units 3 times a day (just before each meal)

## 2014-02-04 ENCOUNTER — Encounter (HOSPITAL_COMMUNITY): Payer: Self-pay | Admitting: Cardiology

## 2014-02-04 ENCOUNTER — Ambulatory Visit: Payer: Medicare FFS | Admitting: Cardiology

## 2014-03-03 ENCOUNTER — Encounter: Payer: Self-pay | Admitting: Cardiology

## 2014-03-03 ENCOUNTER — Ambulatory Visit (INDEPENDENT_AMBULATORY_CARE_PROVIDER_SITE_OTHER): Payer: Medicare FFS | Admitting: Cardiology

## 2014-03-03 VITALS — BP 122/60 | HR 72 | Ht 65.0 in | Wt 183.0 lb

## 2014-03-03 DIAGNOSIS — I255 Ischemic cardiomyopathy: Secondary | ICD-10-CM

## 2014-03-03 DIAGNOSIS — I10 Essential (primary) hypertension: Secondary | ICD-10-CM

## 2014-03-03 DIAGNOSIS — E782 Mixed hyperlipidemia: Secondary | ICD-10-CM

## 2014-03-03 DIAGNOSIS — I2510 Atherosclerotic heart disease of native coronary artery without angina pectoris: Secondary | ICD-10-CM

## 2014-03-03 MED ORDER — CARVEDILOL 6.25 MG PO TABS: 6.2500 mg | ORAL_TABLET | Freq: Two times a day (BID) | ORAL | Status: AC

## 2014-03-03 MED ORDER — SIMVASTATIN 20 MG PO TABS: 20.0000 mg | ORAL_TABLET | Freq: Every day | ORAL | Status: AC

## 2014-03-03 MED ORDER — LOSARTAN POTASSIUM 25 MG PO TABS: 25.0000 mg | ORAL_TABLET | Freq: Every day | ORAL | Status: AC

## 2014-03-03 NOTE — Assessment & Plan Note (Signed)
LVEF most recently documented in the 50-55% range.

## 2014-03-03 NOTE — Patient Instructions (Signed)

## 2014-03-03 NOTE — Assessment & Plan Note (Signed)
Blood pressure is well-controlled today. 

## 2014-03-03 NOTE — Progress Notes (Signed)
Reason for visit: CAD, cardiomyopathy  Clinical Summary [name] is a 64 y.o.female last seen in September 2015. She comes in today for a routine visit. Overall no change in functional status, no angina symptoms, and NYHA class II dyspnea. We reviewed her medications, she needed refills for Coreg, Cozaar, and Zocor. Her weight is down a few pounds from the last visit.  Lab work from November 2015 showed cholesterol 137, triglycerides 211, HDL 33, non-HDL 104, hemoglobin A1c 9.7, potassium 4.8, BUN 26, creatinine 1.2. She continues to follow with Dr. Loanne Drilling.  Followup echocardiogram in March showed moderate LVH with LVEF 50-55%, hypokinesis of the distal anteroseptal wall, grade 1 diastolic dysfunction, moderate left atrial enlargement, mildly thickened mitral valve with mild mitral regurgitation, mildly sclerotic aortic valve, trivial tricuspid regurgitation with normal PASP 26 mm mercury.   Allergies  Allergen Reactions  . Clopidogrel Bisulfate Hives  . Codeine Nausea And Vomiting  . Hydralazine Hives  . Morphine Nausea And Vomiting    Current Outpatient Prescriptions  Medication Sig Dispense Refill  . ACCU-CHEK FASTCLIX LANCETS MISC 1 each by Does not apply route 2 (two) times daily. 200 each 1  . aspirin 325 MG EC tablet Take 325 mg by mouth daily.    . Blood Glucose Monitoring Suppl (ACCU-CHEK NANO SMARTVIEW) W/DEVICE KIT 1 each by Does not apply route 2 (two) times daily. 1 kit 0  . carvedilol (COREG) 6.25 MG tablet Take 1 tablet (6.25 mg total) by mouth 2 (two) times daily with a meal. 180 tablet 3  . diclofenac (VOLTAREN) 75 MG EC tablet Take 75 mg by mouth 2 (two) times daily.    . furosemide (LASIX) 40 MG tablet Take 1 tablet (40 mg total) by mouth every other day. 45 tablet 3  . gabapentin (NEURONTIN) 300 MG capsule Take 1 capsule (300 mg total) by mouth 2 (two) times daily. 60 capsule 5  . glucose blood (ACCU-CHEK SMARTVIEW) test strip Use as instructed to check blood  sugars 2 times per day dx code 250.02 100 each 12  . insulin NPH Human (HUMULIN N,NOVOLIN N) 100 UNIT/ML injection Inject 1 mL (100 Units total) into the skin at bedtime. 3 vial 2  . insulin regular (NOVOLIN R,HUMULIN R) 100 units/mL injection Inject 1.2 mLs (120 Units total) into the skin 3 (three) times daily before meals. (Patient taking differently: Inject 140 Units into the skin 3 (three) times daily before meals. ) 120 mL 11  . isosorbide mononitrate (IMDUR) 30 MG 24 hr tablet Take 1 tablet (30 mg total) by mouth daily. 90 tablet 3  . losartan (COZAAR) 25 MG tablet Take 1 tablet (25 mg total) by mouth daily. 90 tablet 3  . Multiple Vitamin (MULITIVITAMIN WITH MINERALS) TABS Take 1 tablet by mouth daily.    . nitroGLYCERIN (NITROSTAT) 0.4 MG SL tablet Place 1 tablet (0.4 mg total) under the tongue every 5 (five) minutes x 3 doses as needed for chest pain. FOR SEVERE CHEST PAIN. MAX DOSE IS 3 TABLETS IN 15 MINUTES. IF NO RELIEF AFTER 3 RD DOSE, PROCEED TO THE ED FOR AN EVALUATION 25 tablet 3  . simvastatin (ZOCOR) 20 MG tablet Take 1 tablet (20 mg total) by mouth at bedtime. 90 tablet 3  . trazodone (DESYREL) 300 MG tablet Take 1 tablet (300 mg total) by mouth at bedtime. 30 tablet 1   No current facility-administered medications for this visit.    Past Medical History  Diagnosis Date  . Coronary atherosclerosis of  native coronary artery     BMS circ 2001, DES LAD 2005, DES LAD and RCA 2008, subsequent CABG  . Drug allergy     Allergy to Plavix (hives) - took Ticlid in past  . Mixed hyperlipidemia   . Type 2 diabetes mellitus   . Rheumatoid arthritis(714.0)   . Depression   . Peripheral neuropathy   . Peptic ulcer disease ~ 1975  . Nephrolithiasis 02/07/2011  . Breast cancer     Chemo and mastectomy - right  . Chronic systolic heart failure     LVEF 40-45%  . Anxiety   . Sternal wound infection     CABG  . Stroke 07/2011    Past Surgical History  Procedure Laterality Date  .  Mastectomy  1988    right  . Cholecystectomy  1969  . Abdominal hysterectomy  1989  . Tonsillectomy and adenoidectomy  1979  . Appendectomy  1969  . Tubal ligation  1972  . Coronary artery bypass graft  01/11/2011    Procedure: CORONARY ARTERY BYPASS GRAFTING (CABG);  Surgeon: Tharon Aquas Adelene Idler, MD;  Location: [place];  Service: Open Heart Surgery;  Laterality: N/A;  cabg x four, using left internal mammary artery and right leg greater saphenous vein harvestede endoscopically  . Sternal wound debridement  03/01/2011    Procedure: STERNAL WOUND DEBRIDEMENT;  Surgeon: Tharon Aquas Adelene Idler, MD;  Location: [place];  Service: Open Heart Surgery;  Laterality: N/A;  . Application of wound vac  03/01/2011    Procedure: APPLICATION OF WOUND VAC;  Surgeon: Tharon Aquas Adelene Idler, MD;  Location: [place];  Service: Open Heart Surgery;  Laterality: N/A;  . Breast surgery    . Sternal wires removal  04/02/2011    Procedure: STERNAL WIRES REMOVAL;  Surgeon: Tharon Aquas Adelene Idler, MD;  Location: [place];  Service: Open Heart Surgery;  Laterality: N/A;  . Incision and drainage of wound  12/06/2011    Procedure: IRRIGATION AND DEBRIDEMENT WOUND;  Surgeon: Theodoro Kos, DO;  Location: [place];  Service: Plastics;  Laterality: N/A;  IRRIGATION AND DEBRIDEMENT OF STERNAL ULCER AND PLACEMENT OF ACELL   . Left heart catheterization with coronary angiogram N/A 01/05/2011    Procedure: LEFT HEART CATHETERIZATION WITH CORONARY ANGIOGRAM;  Surgeon: Minus Breeding, MD;  [facility];  Service: Cardiovascular;  Laterality: N/A;  . Left heart catheterization with coronary/graft angiogram  07/26/2011    Procedure: LEFT HEART CATHETERIZATION WITH Beatrix Fetters;  Surgeon: Burnell Blanks, MD;  Location: [facility] CATH LAB;  Service: Cardiovascular;;    Social History [name] reports that she quit smoking about 7 years ago. Her smoking use included Cigarettes. She has a 20 pack-year smoking history. She has never used  smokeless tobacco. [name] reports that she does not drink alcohol.  Review of Systems Complete review of systems negative except as otherwise outlined in the clinical summary and also the following. No palpitations or syncope. Having trouble with insomnia, still grieving the loss of her husband. Appetite is been stable. No orthopnea or PND.  Physical Examination Filed Vitals:   03/03/14 0947  BP: 122/60  Pulse: 72   Filed Weights   03/03/14 0947  Weight: 183 lb (83.008 kg)    No distress. HEENT: Conjunctiva and lids normal, oropharynx clear.  Neck: Supple, no elevated JVP or carotid bruits, no thyromegaly.  Lungs: Clear to auscultation, diminished at bases, nonlabored breathing at rest.  Cardiac: Regular rate and rhythm, no S3  or pericardial rub.  Extremities: Trace ankle edema, distal pulses1+.  Skin: Warm and dry.  Musculoskeletal: No kyphosis.  Neuropsychiatric: Alert and oriented x3, affect appropriate.   Problem List and Plan   Coronary atherosclerosis of native coronary artery No angina symptoms. She is status post CABG in 2013 as outlined. Continue current regimen, refills provided for Coreg, and Cozaar. Follow-up in 6 months.  Mixed hyperlipidemia Lipid panel reviewed from November. She continues on Zocor, refill provided.  Essential hypertension, benign Blood pressure is well controlled today.  Ischemic cardiomyopathy LVEF most recently documented in the 50-55% range.    Satira Sark, M.D., F.A.C.C.

## 2014-03-03 NOTE — Assessment & Plan Note (Signed)
No angina symptoms. She is status post CABG in 2013 as outlined. Continue current regimen, refills provided for Coreg, and Cozaar. Follow-up in 6 months.

## 2014-03-03 NOTE — Assessment & Plan Note (Signed)
Lipid panel reviewed from November. She continues on Zocor, refill provided.

## 2014-03-04 ENCOUNTER — Other Ambulatory Visit: Payer: Self-pay | Admitting: *Deleted

## 2014-03-04 ENCOUNTER — Other Ambulatory Visit: Payer: Self-pay | Admitting: Cardiothoracic Surgery

## 2014-03-04 DIAGNOSIS — M792 Neuralgia and neuritis, unspecified: Secondary | ICD-10-CM

## 2014-03-04 MED ORDER — GABAPENTIN 300 MG PO CAPS: 300.0000 mg | ORAL_CAPSULE | Freq: Two times a day (BID) | ORAL | Status: AC

## 2014-03-09 LAB — HM DIABETES EYE EXAM

## 2014-03-11 ENCOUNTER — Encounter: Payer: Self-pay | Admitting: Endocrinology

## 2014-03-22 ENCOUNTER — Ambulatory Visit: Payer: Medicare FFS | Admitting: Endocrinology

## 2014-05-24 ENCOUNTER — Other Ambulatory Visit: Payer: Self-pay

## 2014-05-24 MED ORDER — GLUCOSE BLOOD VI STRP: ORAL_STRIP | Status: AC

## 2014-05-24 MED ORDER — ACCU-CHEK NANO SMARTVIEW W/DEVICE KIT: 1.0000 | PACK | Freq: Two times a day (BID) | Status: AC

## 2014-05-27 ENCOUNTER — Telehealth: Payer: Self-pay

## 2014-05-27 MED ORDER — ACCU-CHEK SMARTVIEW CONTROL VI LIQD: Status: AC

## 2014-05-27 NOTE — Telephone Encounter (Signed)
Solution for Accu-chek test strips sent per request.

## 2014-06-09 ENCOUNTER — Encounter: Payer: Self-pay | Admitting: Endocrinology

## 2014-06-09 ENCOUNTER — Other Ambulatory Visit: Payer: Self-pay

## 2014-06-09 ENCOUNTER — Ambulatory Visit (INDEPENDENT_AMBULATORY_CARE_PROVIDER_SITE_OTHER): Payer: Medicare FFS | Admitting: Endocrinology

## 2014-06-09 VITALS — BP 132/64 | HR 75 | Temp 98.4°F | Ht 65.0 in | Wt 182.0 lb

## 2014-06-09 DIAGNOSIS — E1142 Type 2 diabetes mellitus with diabetic polyneuropathy: Secondary | ICD-10-CM | POA: Diagnosis not present

## 2014-06-09 DIAGNOSIS — G629 Polyneuropathy, unspecified: Secondary | ICD-10-CM | POA: Diagnosis not present

## 2014-06-09 MED ORDER — INSULIN DETEMIR 100 UNIT/ML ~~LOC~~ SOLN: 100.0000 [IU] | Freq: Every day | SUBCUTANEOUS | Status: AC

## 2014-06-09 MED ORDER — INSULIN REGULAR HUMAN 100 UNIT/ML IJ SOLN: 140.0000 [IU] | Freq: Three times a day (TID) | INTRAMUSCULAR | Status: AC

## 2014-06-09 NOTE — Patient Instructions (Addendum)
check your blood sugar twice a day.  vary the time of day when you check, between before the 3 meals, and at bedtime.  also check if you have symptoms of your blood sugar being too high or too low.  please keep a record of the readings and bring it to your next appointment here.  please call us sooner if your blood sugar goes below 70, or if you have a lot of readings over 200.   blood tests are being requested for you today.  We'll let you know about the results. Please come back for a follow-up appointment in 3 months.    Please take regular insulin, 140 units 3 times a day (just before each meal).  you should take the regular insulin only when you are about to eat.  If a meal is missed or significantly delayed, your blood sugar could go low.  Please reduce the levemir to 100 units at bedtime.

## 2014-06-09 NOTE — Progress Notes (Signed)
Subjective:    Patient ID: [name], female    DOB: March 25, 1950, 64 y.o.   MRN: [id number]  HPI Pt returns for f/u of diabetes mellitus:  DM type: insulin-requiring type 2 Dx'ed: 8341 Complications: sensory neuropathy, CVA, CAD, and PAD Therapy: insulin since 1999.  GDM: never. DKA: never Severe hypoglycemia: never Pancreatitis: never Other: she takes multiple daily injections: she takes human insulin due to cost: she has severe insulin resistance. Interval history: She takes only 120 units of reg insulin tid, due to mild hypoglycemia in the early hrs of the am.  no cbg record, but states cbg's vary from 110-200.  It is in general higher as the day goes on Past Medical History  Diagnosis Date  . Coronary atherosclerosis of native coronary artery     BMS circ 2001, DES LAD 2005, DES LAD and RCA 2008, subsequent CABG  . Drug allergy     Allergy to Plavix (hives) - took Ticlid in past  . Mixed hyperlipidemia   . Type 2 diabetes mellitus   . Rheumatoid arthritis(714.0)   . Depression   . Peripheral neuropathy   . Peptic ulcer disease ~ 1975  . Nephrolithiasis 02/07/2011  . Breast cancer     Chemo and mastectomy - right  . Chronic systolic heart failure     LVEF 40-45%  . Anxiety   . Sternal wound infection     CABG  . Stroke 07/2011    Past Surgical History  Procedure Laterality Date  . Mastectomy  1988    right  . Cholecystectomy  1969  . Abdominal hysterectomy  1989  . Tonsillectomy and adenoidectomy  1979  . Appendectomy  1969  . Tubal ligation  1972  . Coronary artery bypass graft  01/11/2011    Procedure: CORONARY ARTERY BYPASS GRAFTING (CABG);  Surgeon: Tharon Aquas Adelene Idler, MD;  Location: [place];  Service: Open Heart Surgery;  Laterality: N/A;  cabg x four, using left internal mammary artery and right leg greater saphenous vein harvestede endoscopically  . Sternal wound debridement  03/01/2011    Procedure: STERNAL WOUND DEBRIDEMENT;  Surgeon: Tharon Aquas Adelene Idler, MD;  Location: [place];  Service: Open Heart Surgery;  Laterality: N/A;  . Application of wound vac  03/01/2011    Procedure: APPLICATION OF WOUND VAC;  Surgeon: Tharon Aquas Adelene Idler, MD;  Location: [place];  Service: Open Heart Surgery;  Laterality: N/A;  . Breast surgery    . Sternal wires removal  04/02/2011    Procedure: STERNAL WIRES REMOVAL;  Surgeon: Tharon Aquas Adelene Idler, MD;  Location: [place];  Service: Open Heart Surgery;  Laterality: N/A;  . Incision and drainage of wound  12/06/2011    Procedure: IRRIGATION AND DEBRIDEMENT WOUND;  Surgeon: Theodoro Kos, DO;  Location: [place];  Service: Plastics;  Laterality: N/A;  IRRIGATION AND DEBRIDEMENT OF STERNAL ULCER AND PLACEMENT OF ACELL   . Left heart catheterization with coronary angiogram N/A 01/05/2011    Procedure: LEFT HEART CATHETERIZATION WITH CORONARY ANGIOGRAM;  Surgeon: Minus Breeding, MD;  Location: [facility] CATH LAB;  Service: Cardiovascular;  Laterality: N/A;  . Left heart catheterization with coronary/graft angiogram  07/26/2011    Procedure: LEFT HEART CATHETERIZATION WITH Beatrix Fetters;  Surgeon: Burnell Blanks, MD;  Location: [facility] CATH LAB;  Service: Cardiovascular;;    History   Social History  . Marital Status: Widowed    Spouse Name: N/A  . Number of Children: N/A  . Years  of Education: N/A   Occupational History  . Not on file.   Social History Main Topics  . Smoking status: Former Smoker -- 1.00 packs/day for 20 years    Types: Cigarettes    Quit date: 02/27/2007  . Smokeless tobacco: Never Used  . Alcohol Use: No  . Drug Use: No  . Sexual Activity: Yes    Birth Control/ Protection: Post-menopausal   Other Topics Concern  . Not on file   Social History Narrative    Current Outpatient Prescriptions on File Prior to Visit  Medication Sig Dispense Refill  . ACCU-CHEK FASTCLIX LANCETS MISC 1 each by Does not apply route 2 (two) times daily. 200 each 1  . aspirin 325 MG EC tablet Take 325 mg by  mouth daily.    . Blood Glucose Calibration (ACCU-CHEK SMARTVIEW CONTROL) LIQD Use as instructed 2 each 1  . Blood Glucose Monitoring Suppl (ACCU-CHEK NANO SMARTVIEW) W/DEVICE KIT 1 each by Does not apply route 2 (two) times daily. 1 kit 0  . carvedilol (COREG) 6.25 MG tablet Take 1 tablet (6.25 mg total) by mouth 2 (two) times daily with a meal. 180 tablet 3  . furosemide (LASIX) 40 MG tablet Take 1 tablet (40 mg total) by mouth every other day. 45 tablet 3  . glucose blood (ACCU-CHEK SMARTVIEW) test strip Use as instructed to check blood sugars 2 times per day dx code E11.9 200 each 2  . isosorbide mononitrate (IMDUR) 30 MG 24 hr tablet Take 1 tablet (30 mg total) by mouth daily. 90 tablet 3  . losartan (COZAAR) 25 MG tablet Take 1 tablet (25 mg total) by mouth daily. 90 tablet 3  . Multiple Vitamin (MULITIVITAMIN WITH MINERALS) TABS Take 1 tablet by mouth daily.    . nitroGLYCERIN (NITROSTAT) 0.4 MG SL tablet Place 1 tablet (0.4 mg total) under the tongue every 5 (five) minutes x 3 doses as needed for chest pain. FOR SEVERE CHEST PAIN. MAX DOSE IS 3 TABLETS IN 15 MINUTES. IF NO RELIEF AFTER 3 RD DOSE, PROCEED TO THE ED FOR AN EVALUATION 25 tablet 3  . simvastatin (ZOCOR) 20 MG tablet Take 1 tablet (20 mg total) by mouth at bedtime. 90 tablet 3  . trazodone (DESYREL) 300 MG tablet Take 1 tablet (300 mg total) by mouth at bedtime. 30 tablet 1  . gabapentin (NEURONTIN) 300 MG capsule Take 1 capsule (300 mg total) by mouth 2 (two) times daily. (Patient not taking: Reported on 06/09/2014) 60 capsule 0   No current facility-administered medications on file prior to visit.    Allergies  Allergen Reactions  . Clopidogrel Bisulfate Hives  . Codeine Nausea And Vomiting  . Hydralazine Hives  . Morphine Nausea And Vomiting    Family History  Problem Relation Age of Onset  . Anesthesia problems Neg Hx     BP 132/64 mmHg  Pulse 75  Temp(Src) 98.4 F (36.9 C) (Oral)  Ht $R'5\' 5"'CS$  (1.651 m)  Wt 182  lb (82.555 kg)  BMI 30.29 kg/m2  SpO2 95%  LMP 01/05/1996  Review of Systems Denies LOC and weight change.      Objective:   Physical Exam VITAL SIGNS:  See vs page GENERAL: no distress Pulses: dorsalis pedis intact bilat.   MSK: no deformity of the feet CV: no leg edema Skin:  no ulcer on the feet.  normal color and temp on the feet. Neuro: sensation is intact to touch on the feet Ext: There is bilateral onychomycosis  of the toenails.   Lab Results  Component Value Date   HGBA1C 9.1* 06/09/2014       Assessment & Plan:  DM: only slightly improved  Patient is advised the following: Patient Instructions  check your blood sugar twice a day.  vary the time of day when you check, between before the 3 meals, and at bedtime.  also check if you have symptoms of your blood sugar being too high or too low.  please keep a record of the readings and bring it to your next appointment here.  please call us sooner if your blood sugar goes below 70, or if you have a lot of readings over 200.   blood tests are being requested for you today.  We'll let you know about the results. Please come back for a follow-up appointment in 3 months.    Please take regular insulin, 140 units 3 times a day (just before each meal).  you should take the regular insulin only when you are about to eat.  If a meal is missed or significantly delayed, your blood sugar could go low.  Please reduce the levemir to 100 units at bedtime.    addendum: i advised a simpler insulin schedule

## 2014-06-10 LAB — BASIC METABOLIC PANEL
BUN: 20 mg/dL (ref 6–23)
CHLORIDE: 107 meq/L (ref 96–112)
CO2: 29 mEq/L (ref 19–32)
Calcium: 9.4 mg/dL (ref 8.4–10.5)
Creatinine, Ser: 0.99 mg/dL (ref 0.40–1.20)
GFR: 59.88 mL/min — AB (ref >60.00)
GLUCOSE: 86 mg/dL (ref 70–99)
POTASSIUM: 4.2 meq/L (ref 3.5–5.1)
SODIUM: 140 meq/L (ref 135–145)

## 2014-06-10 LAB — HEMOGLOBIN A1C: Hgb A1c MFr Bld: 9.1 % — ABNORMAL HIGH (ref 4.6–6.5)

## 2014-07-13 ENCOUNTER — Other Ambulatory Visit: Payer: Self-pay | Admitting: Cardiothoracic Surgery

## 2014-07-13 DIAGNOSIS — Z1231 Encounter for screening mammogram for malignant neoplasm of breast: Secondary | ICD-10-CM

## 2014-08-09 ENCOUNTER — Ambulatory Visit (HOSPITAL_COMMUNITY)
Admission: RE | Admit: 2014-08-09 | Discharge: 2014-08-09 | Disposition: A | Discharge status: Home / Self Care | Payer: Medicare FFS | Source: Ambulatory Visit | Attending: Cardiothoracic Surgery | Admitting: Cardiothoracic Surgery

## 2014-08-09 DIAGNOSIS — Z1231 Encounter for screening mammogram for malignant neoplasm of breast: Secondary | ICD-10-CM | POA: Diagnosis present

## 2014-09-08 ENCOUNTER — Ambulatory Visit: Payer: Medicare FFS | Admitting: Endocrinology

## 2014-09-08 ENCOUNTER — Encounter: Payer: Medicare FFS | Admitting: Cardiology

## 2014-09-08 ENCOUNTER — Encounter: Payer: Self-pay | Admitting: Cardiology

## 2014-09-08 DIAGNOSIS — Z0289 Encounter for other administrative examinations: Secondary | ICD-10-CM

## 2014-09-08 NOTE — Progress Notes (Signed)
No show  This encounter was created in error - please disregard.

## 2014-09-27 DEATH — deceased

## 2014-10-25 ENCOUNTER — Telehealth: Payer: Self-pay

## 2014-10-25 NOTE — Telephone Encounter (Signed)
Pt number on file not working.

## 2015-04-29 ENCOUNTER — Encounter: Payer: Self-pay | Admitting: Internal Medicine

## 2015-07-27 ENCOUNTER — Encounter: Payer: Self-pay | Admitting: Gastroenterology

## 2015-09-20 NOTE — Progress Notes (Signed)
This encounter was created in error - please disregard.
